# Patient Record
Sex: Male | Born: 1955 | Race: Black or African American | Hispanic: No | Marital: Single | Smoking: Former smoker
Health system: Southern US, Community
[De-identification: ages and names within clinical notes are randomized; demographics above are authoritative.]

## PROBLEM LIST (undated history)

## (undated) DIAGNOSIS — K4090 Unilateral inguinal hernia, without obstruction or gangrene, not specified as recurrent: Secondary | ICD-10-CM

## (undated) DIAGNOSIS — N433 Hydrocele, unspecified: Secondary | ICD-10-CM

## (undated) DIAGNOSIS — I255 Ischemic cardiomyopathy: Secondary | ICD-10-CM

## (undated) DIAGNOSIS — I10 Essential (primary) hypertension: Secondary | ICD-10-CM

## (undated) DIAGNOSIS — F17200 Nicotine dependence, unspecified, uncomplicated: Secondary | ICD-10-CM

## (undated) HISTORY — PX: APPENDECTOMY: SHX54

---

## 2013-04-20 ENCOUNTER — Emergency Department (HOSPITAL_COMMUNITY)
Admission: EM | Admit: 2013-04-20 | Discharge: 2013-04-21 | Disposition: A | Discharge status: Home / Self Care | Payer: Self-pay | Attending: Emergency Medicine | Admitting: Emergency Medicine

## 2013-04-20 ENCOUNTER — Encounter (HOSPITAL_COMMUNITY): Payer: Self-pay | Admitting: Emergency Medicine

## 2013-04-20 DIAGNOSIS — M62830 Muscle spasm of back: Secondary | ICD-10-CM

## 2013-04-20 DIAGNOSIS — I10 Essential (primary) hypertension: Secondary | ICD-10-CM | POA: Insufficient documentation

## 2013-04-20 DIAGNOSIS — F17200 Nicotine dependence, unspecified, uncomplicated: Secondary | ICD-10-CM | POA: Insufficient documentation

## 2013-04-20 DIAGNOSIS — M5380 Other specified dorsopathies, site unspecified: Secondary | ICD-10-CM | POA: Insufficient documentation

## 2013-04-20 HISTORY — DX: Essential (primary) hypertension: I10

## 2013-04-20 NOTE — ED Notes (Signed)
Pt. reprors low back pain onset last night after he lifted his leg while taking a bath last night , denies injury or fall . Ambulatory.

## 2013-04-21 MED ORDER — DIAZEPAM 5 MG PO TABS: 5.0000 mg | ORAL_TABLET | Freq: Two times a day (BID) | ORAL | Status: DC | PRN

## 2013-04-21 MED ORDER — PREDNISONE 20 MG PO TABS: 60.0000 mg | ORAL_TABLET | Freq: Every day | ORAL | Status: DC

## 2013-04-21 MED ORDER — PREDNISONE 20 MG PO TABS
60.0000 mg | ORAL_TABLET | Freq: Once | ORAL | Status: AC
  Administered 2013-04-21: 60 mg via ORAL
  Filled 2013-04-21: qty 3

## 2013-04-21 MED ORDER — DIAZEPAM 5 MG PO TABS
5.0000 mg | ORAL_TABLET | Freq: Once | ORAL | Status: AC
  Administered 2013-04-21: 5 mg via ORAL
  Filled 2013-04-21: qty 1

## 2013-04-21 NOTE — ED Provider Notes (Signed)
CSN: [id number]     Arrival date & time 04/20/13  2348 History   First MD Initiated Contact with Patient 04/21/13 0027     Chief Complaint  Patient presents with  . Back Pain   (Consider location/radiation/quality/duration/timing/severity/associated sxs/prior Treatment) HPI Comments: Patient is a 57 year old male past medical history significant for hypertension presenting to the emergency department for severe left lower back pain without radiation. Patient states he was in the shower last night slipped and pulled his lower back without fall. He states he has had constant sharp pain since then. He has tried one dose of over-the-counter pain medication without relief. He states ambulating atrophy of his pain. He denies any fevers, numbness or weakness, bladder or bowel incontinence, history of cancer, history of IV drug use. He denies any previous back injury.  Patient is a 57 y.o. male presenting with back pain.  Back Pain Associated symptoms: no fever     Past Medical History  Diagnosis Date  . Hypertension    History reviewed. No pertinent past surgical history. No family history on file. History  Substance Use Topics  . Smoking status: Current Every Day Smoker  . Smokeless tobacco: Not on file  . Alcohol Use: Yes    Review of Systems  Constitutional: Negative for fever.  Musculoskeletal: Positive for back pain.  All other systems reviewed and are negative.    Allergies  Review of patient's allergies indicates no known allergies.  Home Medications   Current Outpatient Rx  Name  Route  Sig  Dispense  Refill  . diazepam (VALIUM) 5 MG tablet   Oral   Take 1 tablet (5 mg total) by mouth every 12 (twelve) hours as needed (spasm).   15 tablet   0   . predniSONE (DELTASONE) 20 MG tablet   Oral   Take 3 tablets (60 mg total) by mouth daily.   12 tablet   0    BP 165/93  Pulse 73  Temp(Src) 99.3 F (37.4 C) (Oral)  Resp 14  SpO2 99% Physical Exam    Constitutional: He is oriented to person, place, and time. He appears well-developed and well-nourished. No distress.  HENT:  Head: Normocephalic and atraumatic.  Right Ear: External ear normal.  Left Ear: External ear normal.  Nose: Nose normal.  Mouth/Throat: Oropharynx is clear and moist. No oropharyngeal exudate.  Eyes: Conjunctivae and EOM are normal. Pupils are equal, round, and reactive to light.  Neck: Normal range of motion. Neck supple.  Cardiovascular: Normal rate, regular rhythm, normal heart sounds and intact distal pulses.   Pulmonary/Chest: Effort normal and breath sounds normal.  Abdominal: Soft. There is no tenderness.  Musculoskeletal: He exhibits no edema.       Lumbar back: He exhibits tenderness and spasm. He exhibits no bony tenderness, no swelling, no edema, no deformity and no laceration.       Back:  Neurological: He is alert and oriented to person, place, and time. He has normal strength. No cranial nerve deficit or sensory deficit. Gait normal. GCS eye subscore is 4. GCS verbal subscore is 5. GCS motor subscore is 6.  No pronator drift. Bilateral heel-knee-shin intact.  Skin: Skin is warm and dry. He is not diaphoretic.    ED Course  Procedures (including critical care time) Medications  diazepam (VALIUM) tablet 5 mg (5 mg Oral Given 04/21/13 0116)  predniSONE (DELTASONE) tablet 60 mg (60 mg Oral Given 04/21/13 0116)    Labs Review Labs Reviewed - No  data to display Imaging Review No results found.  EKG Interpretation   None       MDM   1. Muscle spasm of back     Afebrile, NAD, non-toxic appearing, AAOx4. Patient with back pain.  No neurological deficits and normal neuro exam.  Patient can walk but states is painful.  No loss of bowel or bladder control.  No concern for cauda equina.  No fever, night sweats, weight loss, h/o cancer, IVDU.  RICE protocol and pain medicine indicated and discussed with patient. Return precautions discussed. Patient  agreeable to plan. Patient stable at time of discharge.      Jeannetta Ellis, PA-C 04/21/13 [phone]

## 2013-04-21 NOTE — ED Notes (Signed)
Pt reports he was wiping his left foot last night and had sudden onset of pain left lower back.  Denies N/T distally.  Unable to move by himself due to pain.

## 2013-04-21 NOTE — ED Provider Notes (Signed)
Medical screening examination/treatment/procedure(s) were performed by non-physician practitioner and as supervising physician I was immediately available for consultation/collaboration.  EKG Interpretation   None         David H Yao, MD 04/21/13 0718 

## 2013-04-25 ENCOUNTER — Inpatient Hospital Stay (HOSPITAL_COMMUNITY)
Admission: EM | Admit: 2013-04-25 | Discharge: 2013-04-27 | DRG: 552 | Disposition: A | Discharge status: Home / Self Care | Payer: Self-pay | Attending: Internal Medicine | Admitting: Internal Medicine

## 2013-04-25 ENCOUNTER — Observation Stay (HOSPITAL_COMMUNITY): Payer: Self-pay

## 2013-04-25 ENCOUNTER — Encounter (HOSPITAL_COMMUNITY): Payer: Self-pay | Admitting: Emergency Medicine

## 2013-04-25 ENCOUNTER — Emergency Department (HOSPITAL_COMMUNITY): Payer: Self-pay

## 2013-04-25 DIAGNOSIS — M5417 Radiculopathy, lumbosacral region: Secondary | ICD-10-CM

## 2013-04-25 DIAGNOSIS — M5126 Other intervertebral disc displacement, lumbar region: Secondary | ICD-10-CM

## 2013-04-25 DIAGNOSIS — M5116 Intervertebral disc disorders with radiculopathy, lumbar region: Secondary | ICD-10-CM

## 2013-04-25 DIAGNOSIS — F419 Anxiety disorder, unspecified: Secondary | ICD-10-CM

## 2013-04-25 DIAGNOSIS — F17200 Nicotine dependence, unspecified, uncomplicated: Secondary | ICD-10-CM | POA: Diagnosis present

## 2013-04-25 DIAGNOSIS — M5137 Other intervertebral disc degeneration, lumbosacral region: Principal | ICD-10-CM | POA: Diagnosis present

## 2013-04-25 DIAGNOSIS — D72829 Elevated white blood cell count, unspecified: Secondary | ICD-10-CM

## 2013-04-25 DIAGNOSIS — M51379 Other intervertebral disc degeneration, lumbosacral region without mention of lumbar back pain or lower extremity pain: Principal | ICD-10-CM | POA: Diagnosis present

## 2013-04-25 DIAGNOSIS — I10 Essential (primary) hypertension: Secondary | ICD-10-CM

## 2013-04-25 DIAGNOSIS — F411 Generalized anxiety disorder: Secondary | ICD-10-CM | POA: Diagnosis present

## 2013-04-25 DIAGNOSIS — M549 Dorsalgia, unspecified: Secondary | ICD-10-CM

## 2013-04-25 LAB — CBC WITH DIFFERENTIAL/PLATELET
Basophils Absolute: 0 10*3/uL (ref 0.0–0.1)
Eosinophils Relative: 0 % (ref 0–5)
HCT: 43 % (ref 39.0–52.0)
Hemoglobin: 15.5 g/dL (ref 13.0–17.0)
Lymphocytes Relative: 15 % (ref 12–46)
Lymphs Abs: 1.9 10*3/uL (ref 0.7–4.0)
MCV: 94.3 fL (ref 78.0–100.0)
Monocytes Absolute: 0.2 10*3/uL (ref 0.1–1.0)
Monocytes Relative: 2 % — ABNORMAL LOW (ref 3–12)
RBC: 4.56 MIL/uL (ref 4.22–5.81)
RDW: 12.8 % (ref 11.5–15.5)
WBC: 12.8 10*3/uL — ABNORMAL HIGH (ref 4.0–10.5)

## 2013-04-25 LAB — COMPREHENSIVE METABOLIC PANEL
ALT: 14 U/L (ref 0–53)
Albumin: 3.2 g/dL — ABNORMAL LOW (ref 3.5–5.2)
Alkaline Phosphatase: 74 U/L (ref 39–117)
BUN: 16 mg/dL (ref 6–23)
Calcium: 8.7 mg/dL (ref 8.4–10.5)
Creatinine, Ser: 1.02 mg/dL (ref 0.50–1.35)
GFR calc non Af Amer: 81 mL/min — ABNORMAL LOW (ref >90)
Glucose, Bld: 185 mg/dL — ABNORMAL HIGH (ref 70–99)
Potassium: 4.4 mEq/L (ref 3.5–5.1)
Sodium: 138 mEq/L (ref 135–145)
Total Protein: 7 g/dL (ref 6.0–8.3)

## 2013-04-25 LAB — URINALYSIS, ROUTINE W REFLEX MICROSCOPIC
Glucose, UA: NEGATIVE mg/dL
Hgb urine dipstick: NEGATIVE
Specific Gravity, Urine: 1.018 (ref 1.005–1.030)
pH: 6 (ref 5.0–8.0)

## 2013-04-25 LAB — CBC
HCT: 43.3 % (ref 39.0–52.0)
Hemoglobin: 15.4 g/dL (ref 13.0–17.0)
MCH: 33.5 pg (ref 26.0–34.0)
MCV: 94.1 fL (ref 78.0–100.0)
Platelets: 262 10*3/uL (ref 150–400)
RBC: 4.6 MIL/uL (ref 4.22–5.81)
WBC: 15.1 10*3/uL — ABNORMAL HIGH (ref 4.0–10.5)

## 2013-04-25 LAB — BASIC METABOLIC PANEL
BUN: 15 mg/dL (ref 6–23)
CO2: 23 mEq/L (ref 19–32)
Calcium: 8.6 mg/dL (ref 8.4–10.5)
Chloride: 101 mEq/L (ref 96–112)
Creatinine, Ser: 0.98 mg/dL (ref 0.50–1.35)
Glucose, Bld: 213 mg/dL — ABNORMAL HIGH (ref 70–99)

## 2013-04-25 MED ORDER — SORBITOL 70 % SOLN
30.0000 mL | Freq: Every day | Status: DC | PRN
  Filled 2013-04-25: qty 30

## 2013-04-25 MED ORDER — SODIUM CHLORIDE 0.9 % IV SOLN
INTRAVENOUS | Status: DC
  Administered 2013-04-25 – 2013-04-27 (×3): via INTRAVENOUS

## 2013-04-25 MED ORDER — PREDNISONE 50 MG PO TABS
60.0000 mg | ORAL_TABLET | Freq: Every day | ORAL | Status: DC
  Administered 2013-04-25 – 2013-04-27 (×3): 60 mg via ORAL
  Filled 2013-04-25 (×4): qty 1

## 2013-04-25 MED ORDER — ALUM & MAG HYDROXIDE-SIMETH 200-200-20 MG/5ML PO SUSP
15.0000 mL | Freq: Once | ORAL | Status: AC
  Administered 2013-04-25: 15 mL via ORAL
  Filled 2013-04-25: qty 30

## 2013-04-25 MED ORDER — DIAZEPAM 5 MG PO TABS
5.0000 mg | ORAL_TABLET | Freq: Two times a day (BID) | ORAL | Status: DC | PRN
  Administered 2013-04-25 – 2013-04-26 (×2): 5 mg via ORAL
  Filled 2013-04-25 (×2): qty 1

## 2013-04-25 MED ORDER — HYDROMORPHONE HCL PF 1 MG/ML IJ SOLN
1.0000 mg | Freq: Once | INTRAMUSCULAR | Status: AC
  Administered 2013-04-25: 1 mg via INTRAVENOUS
  Filled 2013-04-25: qty 1

## 2013-04-25 MED ORDER — HYDROMORPHONE HCL PF 1 MG/ML IJ SOLN: 1.0000 mg | INTRAMUSCULAR | Status: DC | PRN

## 2013-04-25 MED ORDER — DOCUSATE SODIUM 100 MG PO CAPS
100.0000 mg | ORAL_CAPSULE | Freq: Two times a day (BID) | ORAL | Status: DC
  Administered 2013-04-25 – 2013-04-27 (×5): 100 mg via ORAL
  Filled 2013-04-25 (×4): qty 1

## 2013-04-25 MED ORDER — OXYCODONE HCL 5 MG PO TABS
5.0000 mg | ORAL_TABLET | ORAL | Status: DC | PRN
  Administered 2013-04-25 – 2013-04-27 (×6): 5 mg via ORAL
  Filled 2013-04-25 (×6): qty 1

## 2013-04-25 MED ORDER — LIDOCAINE 5 % EX PTCH
1.0000 | MEDICATED_PATCH | CUTANEOUS | Status: DC
  Administered 2013-04-25 – 2013-04-26 (×2): 1 via TRANSDERMAL
  Filled 2013-04-25 (×3): qty 1

## 2013-04-25 MED ORDER — METHYLPREDNISOLONE SODIUM SUCC 125 MG IJ SOLR
125.0000 mg | Freq: Once | INTRAMUSCULAR | Status: AC
  Administered 2013-04-25: 125 mg via INTRAVENOUS
  Filled 2013-04-25: qty 2

## 2013-04-25 MED ORDER — AMLODIPINE BESYLATE 5 MG PO TABS
5.0000 mg | ORAL_TABLET | Freq: Every day | ORAL | Status: DC
  Administered 2013-04-25 – 2013-04-27 (×3): 5 mg via ORAL
  Filled 2013-04-25 (×3): qty 1

## 2013-04-25 MED ORDER — HYDROMORPHONE HCL PF 1 MG/ML IJ SOLN
2.0000 mg | INTRAMUSCULAR | Status: DC | PRN
  Administered 2013-04-26 (×2): 2 mg via INTRAVENOUS
  Filled 2013-04-25 (×2): qty 2

## 2013-04-25 MED ORDER — ACETAMINOPHEN 325 MG PO TABS: 650.0000 mg | ORAL_TABLET | Freq: Four times a day (QID) | ORAL | Status: DC | PRN

## 2013-04-25 MED ORDER — ONDANSETRON HCL 4 MG/2ML IJ SOLN
4.0000 mg | Freq: Once | INTRAMUSCULAR | Status: AC
  Administered 2013-04-25: 4 mg via INTRAVENOUS
  Filled 2013-04-25: qty 2

## 2013-04-25 MED ORDER — ENOXAPARIN SODIUM 40 MG/0.4ML ~~LOC~~ SOLN
40.0000 mg | SUBCUTANEOUS | Status: DC
  Administered 2013-04-25 – 2013-04-26 (×2): 40 mg via SUBCUTANEOUS
  Filled 2013-04-25 (×3): qty 0.4

## 2013-04-25 MED ORDER — SODIUM CHLORIDE 0.9 % IV SOLN
Freq: Once | INTRAVENOUS | Status: AC
  Administered 2013-04-25: 04:00:00 via INTRAVENOUS

## 2013-04-25 MED ORDER — ACETAMINOPHEN 650 MG RE SUPP: 650.0000 mg | Freq: Four times a day (QID) | RECTAL | Status: DC | PRN

## 2013-04-25 NOTE — ED Notes (Signed)
Pt has returned from radiology.  

## 2013-04-25 NOTE — Progress Notes (Signed)
See formal consult note

## 2013-04-25 NOTE — ED Provider Notes (Signed)
CSN: [id number]     Arrival date & time 04/25/13  0255 History   First MD Initiated Contact with Patient 04/25/13 0309     Chief Complaint  Patient presents with  . Back Pain   (Consider location/radiation/quality/duration/timing/severity/associated sxs/prior Treatment) HPI Comments:  Persistent low back pain radiating to L buttock for the past 5 days despite treatment with steroids, muscle relaxers and narcotic pain control Denies loss of bowel or bladder control weakness in legs   Patient is a 57 y.o. male presenting with back pain. The history is provided by the patient and the spouse.  Back Pain Location:  Lumbar spine Quality:  Aching and shooting Radiates to:  L posterior upper leg Pain severity:  Severe Pain is:  Same all the time Onset quality:  Sudden Duration:  5 days Timing:  Constant Progression:  Worsening Chronicity:  New Context: twisting   Context: not falling and not recent injury   Context comment:  Patiet stes he was bending in the shower when he felt a sharp pain in his back that has been persistent since  Relieved by:  Nothing Worsened by:  Movement Ineffective treatments:  Muscle relaxants, narcotics, heating pad, cold packs, lying down and ibuprofen Associated symptoms: no bladder incontinence, no bowel incontinence, no chest pain, no dysuria, no fever, no numbness, no paresthesias and no weakness   Risk factors: steroid use   Risk factors: not obese     Past Medical History  Diagnosis Date  . Hypertension    History reviewed. No pertinent past surgical history. No family history on file. History  Substance Use Topics  . Smoking status: Current Every Day Smoker  . Smokeless tobacco: Not on file  . Alcohol Use: Yes    Review of Systems  Constitutional: Negative for fever, chills, activity change and appetite change.  Respiratory: Negative for shortness of breath.   Cardiovascular: Negative for chest pain and leg swelling.  Gastrointestinal:  Negative for bowel incontinence.  Genitourinary: Negative for bladder incontinence, dysuria and frequency.  Musculoskeletal: Positive for back pain. Negative for neck pain and neck stiffness.  Skin: Negative for rash and wound.  Neurological: Negative for dizziness, weakness, numbness and paresthesias.    Allergies  Review of patient's allergies indicates no known allergies.  Home Medications   Current Outpatient Rx  Name  Route  Sig  Dispense  Refill  . diazepam (VALIUM) 5 MG tablet   Oral   Take 1 tablet (5 mg total) by mouth every 12 (twelve) hours as needed (spasm).   15 tablet   0   . predniSONE (DELTASONE) 20 MG tablet   Oral   Take 3 tablets (60 mg total) by mouth daily.   12 tablet   0    BP 156/88  Pulse 64  Temp(Src) 98.3 F (36.8 C) (Oral)  Resp 18  SpO2 99% Physical Exam  Nursing note and vitals reviewed. Constitutional: He is oriented to person, place, and time. He appears well-nourished.  HENT:  Head: Normocephalic.  Eyes: Pupils are equal, round, and reactive to light.  Neck: Normal range of motion.  Cardiovascular: Normal rate and regular rhythm.   Pulmonary/Chest: Effort normal and breath sounds normal.  Musculoskeletal: He exhibits tenderness. He exhibits no edema.       Lumbar back: He exhibits decreased range of motion, tenderness and pain. He exhibits no bony tenderness, no swelling, no edema, no deformity and no spasm.       Back:  Neurological: He is alert  and oriented to person, place, and time.  Pain with straight leg raise, flexion   Skin: Skin is warm and dry. No rash noted. No erythema.    ED Course  Procedures (including critical care time) Labs Review Labs Reviewed - No data to display Imaging Review Dg Lumbar Spine Complete  04/25/2013   CLINICAL DATA:  Back pain for the past week.  EXAM: LUMBAR SPINE - COMPLETE 4+ VIEW  COMPARISON:  No priors.  FINDINGS: Five views of the lumbar spine demonstrate no definite acute displaced  fracture or compression type fracture. Mild multilevel degenerative disc disease, most severe at L2-L3. Multilevel facet arthropathy. No defects of the pars interarticularis. Mild dextroscoliosis of the lumbar spine convex to the right at L2-L3. Alignment is otherwise anatomic.  IMPRESSION: 1. No acute radiographic abnormality of the lumbar spine. 2. Mild multilevel degenerative disc disease, lumbar spondylosis and mild dextroscoliosis, as above.   Electronically Signed   By: Trudie Reed M.D.   On: 04/25/2013 04:14   Ct Lumbar Spine Wo Contrast  04/25/2013   CLINICAL DATA:  Chronic low back pain, failing conservative therapy.  EXAM: CT LUMBAR SPINE WITHOUT CONTRAST  TECHNIQUE: Multidetector CT imaging of the lumbar spine was performed without intravenous contrast administration. Multiplanar CT image reconstructions were also generated.  COMPARISON:  Lumbar spine radiographs April 25, 2013.  FINDINGS: Lumbar vertebral bodies and posterior elements are intact and aligned with maintenance of the lumbar lordosis. Using the reference level of the last well-formed intervertebral disc as L5-S1, mild L2-3 disc height loss, with proportional central endplate spurring, minimal endplate spurring at the remaining lumbar levels. 10 mm sclerotic focus within the posterior aspect of L2 vertebral body may reflect a hemangioma, without aggressive features. Minimal platelike calcification within the ventral epidural space at L2. Included prevertebral and paraspinal soft tissues are nonsuspicious. Mild calcific atherosclerosis the great vessels.  Level by level evaluation:  L1-L2: Small left paracentral broad-based disc osteophyte complex, no canal stenosis or neural foraminal narrowing.  L2-L3: Large broad-based disk bulge. Mild facet arthropathy and ligamentum flavum redundancy. Moderate to severe canal stenosis. Mild neural foraminal narrowing.  L3-L4: Small broad-based disc bulge asymmetric to left. Mild to moderate  facet arthropathy and ligamentum flavum redundancy. Moderate canal stenosis. Mild bilateral neural foraminal narrowing.  L4-L5: Moderate broad-based disc bulge with moderate to severe right, moderate left facet arthropathy and ligamentum flavum redundancy result in at least moderate canal stenosis. Moderate to severe bilateral neural foraminal narrowing.  L5-S1: Small broad-based disc bulge. Moderate to severe facet arthropathy, left greater than right without canal stenosis. Moderate to severe right, severe left neural foraminal narrowing.  IMPRESSION: No acute fracture or malalignment.  Degenerative change of the lumbar spine, with resultant apparent moderate to severe canal stenosis at L2-3, moderate at L3-4 and L4-5.  Neural foraminal narrowing L2-3 to L5-S1: Severe on the left at L5-S1. Constellation of findings may be better assessed on MRI of the lumbar spine as clinically indicated.   Electronically Signed   By: Awilda Metro   On: 04/25/2013 05:05    EKG Interpretation   None       MDM   1. Lumbar disc disease with radiculopathy    Spoke with Dr. Holley Raring, who reviewed CT scan and aggress with MRI, admission to hospital for pain control  Will admit for pain control     Arman Filter, NP 04/25/13 0547  Arman Filter, NP 04/25/13 1610  Arman Filter, NP 04/25/13 [phone]

## 2013-04-25 NOTE — Consult Note (Signed)
Neurosurgical Consult  Logan Brewer NFA:[id number] DOB: [birth year] DOA: 04/25/2013  Referring physician:  PCP: No PCP Per Patient  Specialists:  Chief Complaint: Back pain  HPI: Logan Brewer is a 57 y.o. male with no significant past medical history who presented to the emergency department overnight with complaints of lower back pain. He states that symptoms started last Thursday when he leaned over in the shower. He described experiencing severe sudden onset left lower back pain. He was seen in the emergency room on 04/21/2013 as his back pain was felt to be non-emergent, without loss of bowel or bladder control. Patient did not report fevers chills night sweats. He was managed conservatively and discharged from emergency department. He came in again overnight with ongoing back pain. He states symptoms are localized to his left lower back, nonradiating. He denies focal weakness or alteration to sensation and lower extremities. Symptoms are aggravated by sitting up or movement of his back. He denies urinary and bowel incontinence.At the present time, he is complaining of focal left low back pain without radiation into the legs.  He denies weakness or bowel or bladder dysfunction..  Review of Systems: The patient denies anorexia, fever, weight loss,, vision loss, decreased hearing, hoarseness, chest pain, syncope, dyspnea on exertion, peripheral edema, balance deficits, hemoptysis, abdominal pain, melena, hematochezia, severe indigestion/heartburn, hematuria, incontinence, genital sores, muscle weakness, suspicious skin lesions, transient blindness, difficulty walking, depression, unusual weight change, abnormal bleeding, enlarged lymph nodes, angioedema, and breast masses.   Past Medical History   Diagnosis  Date   .  Hypertension     Past Surgical History   Procedure  Laterality  Date   .  Appendectomy      Social History: Patient smokes about a pack of cigarettes a day, denies alcohol or  illicit drug use. He has worked in [employer] for the past 8 years. He is originally from [place]  No Known Allergies  Family History. He reports his mother having a history of hypertension and diabetes, passed away secondary to diabetic complications. He also reports having a sister with a history of hypertension.  Prior to Admission medications   Medication  Sig  Start Date  End Date  Taking?  Authorizing Provider   diazepam (VALIUM) 5 MG tablet  Take 1 tablet (5 mg total) by mouth every 12 (twelve) hours as needed (spasm).  04/21/13   Yes  Jennifer L Piepenbrink, PA-C   predniSONE (DELTASONE) 20 MG tablet  Take 3 tablets (60 mg total) by mouth daily.  04/21/13   Yes  Lise Auer Piepenbrink, PA-C   Physical Exam:  Filed Vitals:    04/25/13 1313   BP:  171/74   Pulse:  56   Temp:  97.9 F (36.6 C)   Resp:  20    General: No acute distress he is awake alert, nontoxic appearing  Eyes: Pupils are equal round reactive to light extraocular movements are intact  Neck: Supple symmetrical no jugular venous distention  Cardiovascular: Regular rate rhythm normal S1-S2, no murmurs rubs or gallop  Respiratory: Normal respiratory effort, lungs are clear to auscultation bilaterally  Abdomen: Soft nontender nondistended positive bowel sounds  Skin: No rashes or lesion  Musculoskeletal: He has positive straight leg rising to left lower extremity and right lower extremity. No extremity edema noted. He complains of pain to palpation over left lower back region. No palpable masses.  Psychiatric: Patient is awake alert and oriented x3  Neurologic: Patient having nonfocal neurologic examination. There are no  alteration to sensation to extremities, overall he has 5 out of 5 muscle strength to bilateral upper and lower extremities. Cranial nerves II through XII are grossly intact.  He denies numbness in his legs.  Reflexes are symmetric.  He has a positive left straight leg raise at 45 degrees for low back pain  and negative on the right.  Labs on Admission:  Basic Metabolic Panel:   Recent Labs  Lab  04/25/13 0714   NA  138   K  4.4   CL  105   CO2  25   GLUCOSE  185*   BUN  16   CREATININE  1.02   CALCIUM  8.7    Liver Function Tests:   Recent Labs  Lab  04/25/13 0714   AST  12   ALT  14   ALKPHOS  74   BILITOT  0.2*   PROT  7.0   ALBUMIN  3.2*    No results found for this basename: LIPASE, AMYLASE, in the last 168 hours  No results found for this basename: AMMONIA, in the last 168 hours  CBC:   Recent Labs  Lab  04/25/13 0714   WBC  12.8*   NEUTROABS  10.7*   HGB  15.5   HCT  43.0   MCV  94.3   PLT  251    Cardiac Enzymes:  No results found for this basename: CKTOTAL, CKMB, CKMBINDEX, TROPONINI, in the last 168 hours  BNP (last 3 results)  No results found for this basename: PROBNP, in the last 8760 hours  CBG:  No results found for this basename: GLUCAP, in the last 168 hours  Radiological Exams on Admission:  Dg Lumbar Spine Complete  04/25/2013 CLINICAL DATA: Back pain for the past week. EXAM: LUMBAR SPINE - COMPLETE 4+ VIEW COMPARISON: No priors. FINDINGS: Five views of the lumbar spine demonstrate no definite acute displaced fracture or compression type fracture. Mild multilevel degenerative disc disease, most severe at L2-L3. Multilevel facet arthropathy. No defects of the pars interarticularis. Mild dextroscoliosis of the lumbar spine convex to the right at L2-L3. Alignment is otherwise anatomic. IMPRESSION: 1. No acute radiographic abnormality of the lumbar spine. 2. Mild multilevel degenerative disc disease, lumbar spondylosis and mild dextroscoliosis, as above. Electronically Signed By: Trudie Reed M.D. On: 04/25/2013 04:14  Ct Lumbar Spine Wo Contrast  04/25/2013 CLINICAL DATA: Chronic low back pain, failing conservative therapy. EXAM: CT LUMBAR SPINE WITHOUT CONTRAST TECHNIQUE: Multidetector CT imaging of the lumbar spine was performed without intravenous  contrast administration. Multiplanar CT image reconstructions were also generated. COMPARISON: Lumbar spine radiographs April 25, 2013. FINDINGS: Lumbar vertebral bodies and posterior elements are intact and aligned with maintenance of the lumbar lordosis. Using the reference level of the last well-formed intervertebral disc as L5-S1, mild L2-3 disc height loss, with proportional central endplate spurring, minimal endplate spurring at the remaining lumbar levels. 10 mm sclerotic focus within the posterior aspect of L2 vertebral body may reflect a hemangioma, without aggressive features. Minimal platelike calcification within the ventral epidural space at L2. Included prevertebral and paraspinal soft tissues are nonsuspicious. Mild calcific atherosclerosis the great vessels. Level by level evaluation: L1-L2: Small left paracentral broad-based disc osteophyte complex, no canal stenosis or neural foraminal narrowing. L2-L3: Large broad-based disk bulge. Mild facet arthropathy and ligamentum flavum redundancy. Moderate to severe canal stenosis. Mild neural foraminal narrowing. L3-L4: Small broad-based disc bulge asymmetric to left. Mild to moderate facet arthropathy and ligamentum flavum redundancy. Moderate  canal stenosis. Mild bilateral neural foraminal narrowing. L4-L5: Moderate broad-based disc bulge with moderate to severe right, moderate left facet arthropathy and ligamentum flavum redundancy result in at least moderate canal stenosis. Moderate to severe bilateral neural foraminal narrowing. L5-S1: Small broad-based disc bulge. Moderate to severe facet arthropathy, left greater than right without canal stenosis. Moderate to severe right, severe left neural foraminal narrowing. IMPRESSION: No acute fracture or malalignment. Degenerative change of the lumbar spine, with resultant apparent moderate to severe canal stenosis at L2-3, moderate at L3-4 and L4-5. Neural foraminal narrowing L2-3 to L5-S1: Severe on the  left at L5-S1. Constellation of findings may be better assessed on MRI of the lumbar spine as clinically indicated. Electronically Signed By: Awilda Metro On: 04/25/2013 05:05  Mr Lumbar Spine Wo Contrast  04/25/2013 CLINICAL DATA: Low back pain with left hip pain EXAM: MRI LUMBAR SPINE WITHOUT CONTRAST TECHNIQUE: Multiplanar, multisequence MR imaging was performed. No intravenous contrast was administered. COMPARISON: CT lumbar 04/25/2013 FINDINGS: Normal lumbar alignment. Negative for fracture. Conus medullaris has normal signal and terminates at L2-3. 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 Sclerotic lesion is present in the L2 vertebral body on the right posteriorly. This corresponds to the abnormality on CT. There is a mild amount of edema surrounding the sclerotic focus which measures 10 mm. There is some dural calcification posterior to the lesion. No soft tissue mass in the dural space is present. No other bone marrow lesions are identified. L1-2: Mild disk degeneration. Small left-sided disc protrusion. Mild facet degeneration. L2-3: Disc degeneration with disk space narrowing and diffuse disc bulging. Moderately large left-sided disc protrusion with impingement of the left L3 nerve root. Possible extruded disk fragment is present on the left. There is facet hypertrophy and moderate to severe spinal stenosis. L3-4: Disc degeneration with diffuse disc bulging and spondylosis. Bilateral facet hypertrophy causing moderate spinal stenosis. L4-5: Disc degeneration with disk bulging and spondylosis. Bilateral facet hypertrophy with moderate to severe spinal stenosis. Moderate foraminal encroachment bilaterally. L5-S1: Disk bulging and spurring diffusely left greater than right. Bilateral facet hypertrophy left greater than right. Moderate left foraminal encroachment with impingement of the left L5 nerve root. Mild to moderate right foraminal  encroachment also noted. IMPRESSION: Sclerotic lesion in the L2 vertebral body on the right posteriorly corresponding to the CT finding. This appears to be a nonaggressive process there is associated dural calcification at this level. This may be a healed process, perhaps a hemangioma. Followup MRI with contrast in 3-6 months is suggested to assure stability. Moderately large left paracentral disc protrusion L2-3 with impingement of the left L3 nerve root and moderate to severe spinal stenosis. Multifactorial spinal stenosis at L3-4 and L4-5. Left foraminal encroachment L5-S1. Electronically Signed By: Marlan Palau M.D. On: 04/25/2013 08:33   Assessment/Plan  Active Problems:  Radiculopathy, lumbosacral region  Back pain  Anxiety   1. Lower back pain. Patient having 2 ER visits with complaints of lower back pain. An MRI of lumbar spine today showing moderately large left paracentral disc protrusion of L2 and L3 with impingement of the L3 nerve root and moderate to severe spinal stenosis. I do not believe this patient needs surgical intervention at this time.  He may do well with a left L 3 selective nerve root block and would have Interventional Radiology proceed with that tomorrow.  He should also commence PT while in the hospital.  He can follow up with me as  an outpatient. 2. Hypertension. Throughout the day he has had systolic blood pressures mostly in the 140s however recent blood pressure was 171/74. This could be related to severe pain. He relates having a history of hypertension, will start Norvasc 5 mg by mouth daily.  3. Leukocytosis. White count of 12,800 on initial lab work. He is currently afebrile, nontoxic. Could be secondary to underlying inflammatory process. Hold off on antimicrobial therapy for now. 4. DVT prophylaxis. Lovenox  Code Status: Full Code  Family Communication: Plan discussed with the patient  Disposition Plan: Patient requiring IV narcotic analgesics, I suspect he may  require greater than 2 night hospitalization   Josie Saunders MD

## 2013-04-25 NOTE — ED Notes (Signed)
Pt. reports chronic left lower back pain for several days , denies injury or fall , no urinary discomfort / no hematuria , seen here last week prescribed with prednisone and valium with no improvement.

## 2013-04-25 NOTE — ED Notes (Signed)
Family at bedside. 

## 2013-04-25 NOTE — ED Provider Notes (Signed)
Medical screening examination/treatment/procedure(s) were performed by non-physician practitioner and as supervising physician I was immediately available for consultation/collaboration.   Stefon Ramthun, MD 04/25/13 0624 

## 2013-04-25 NOTE — H&P (Signed)
[facility] History and Physical  Stephane Niemann HYQ:[id number] DOB: May 06, 1956 DOA: 04/25/2013  Referring physician:  PCP: No PCP Per Patient  Specialists:   Chief Complaint: Back pain  HPI: Logan Brewer is a 57 y.o. male with no significant past medical history who presented to the emergency department overnight with complaints of lower back pain. He states that symptoms started last Thursday when he leaned over in the shower. He described experiencing severe sudden onset left lower back pain. He was seen in the emergency room on 04/21/2013  as his back pain was felt to be non-emergent, without loss of bowel or bladder control. Patient did not report fevers chills night sweats. He was managed conservatively and discharged from emergency department. He came in again overnight with ongoing back pain. He states symptoms are localized to his left lower back, nonradiating. He denies focal weakness or alteration to sensation and lower extremities. Symptoms are aggravated by sitting up or movement of his back. He denies urinary and bowel incontinence. I discussed case with Dr. Venetia Maxon of neurosurgery who will evaluate patient this afternoon.                                                                           Review of Systems: The patient denies anorexia, fever, weight loss,, vision loss, decreased hearing, hoarseness, chest pain, syncope, dyspnea on exertion, peripheral edema, balance deficits, hemoptysis, abdominal pain, melena, hematochezia, severe indigestion/heartburn, hematuria, incontinence, genital sores, muscle weakness, suspicious skin lesions, transient blindness, difficulty walking, depression, unusual weight change, abnormal bleeding, enlarged lymph nodes, angioedema, and breast masses.   Past Medical History  Diagnosis Date  . Hypertension    Past Surgical History  Procedure Laterality Date  . Appendectomy     Social History: Patient smokes about a pack of cigarettes a day,  denies alcohol or illicit drug use. He has worked in [employer] for the past 8 years. He is originally from [place] No Known Allergies  Family History.  He reports his mother having a history of hypertension and diabetes, passed away secondary to diabetic complications. He also reports having a sister with a history of hypertension.  Prior to Admission medications   Medication Sig Start Date End Date Taking? Authorizing Provider  diazepam (VALIUM) 5 MG tablet Take 1 tablet (5 mg total) by mouth every 12 (twelve) hours as needed (spasm). 04/21/13  Yes Jennifer L Piepenbrink, PA-C  predniSONE (DELTASONE) 20 MG tablet Take 3 tablets (60 mg total) by mouth daily. 04/21/13  Yes Lise Auer Piepenbrink, PA-C   Physical Exam: Filed Vitals:   04/25/13 1313  BP: 171/74  Pulse: 56  Temp: 97.9 F (36.6 C)  Resp: 20     General:  No acute distress he is awake alert, nontoxic appearing  Eyes: Pupils are equal round reactive to light extraocular movements are intact  Neck: Supple symmetrical no jugular venous distention  Cardiovascular: Regular rate rhythm normal S1-S2, no murmurs rubs or gallop  Respiratory: Normal respiratory effort, lungs are clear to auscultation bilaterally  Abdomen: Soft nontender nondistended positive bowel sounds  Skin: No rashes or lesion  Musculoskeletal: He has positive straight leg rising to left lower extremity and right lower extremity. No extremity edema noted. He  complains of pain to palpation over left lower back region. No palpable masses.  Psychiatric: Patient is awake alert and oriented x3  Neurologic: Patient having nonfocal neurologic examination. There are no alteration to sensation to extremities, overall he has 5 out of 5 muscle strength to bilateral upper and lower extremities. Cranial nerves II through XII are grossly intact  Labs on Admission:  Basic Metabolic Panel:  Recent Labs Lab 04/25/13 0714  NA 138  K 4.4  CL 105  CO2 25  GLUCOSE  185*  BUN 16  CREATININE 1.02  CALCIUM 8.7   Liver Function Tests:  Recent Labs Lab 04/25/13 0714  AST 12  ALT 14  ALKPHOS 74  BILITOT 0.2*  PROT 7.0  ALBUMIN 3.2*   No results found for this basename: LIPASE, AMYLASE,  in the last 168 hours No results found for this basename: AMMONIA,  in the last 168 hours CBC:  Recent Labs Lab 04/25/13 0714  WBC 12.8*  NEUTROABS 10.7*  HGB 15.5  HCT 43.0  MCV 94.3  PLT 251   Cardiac Enzymes: No results found for this basename: CKTOTAL, CKMB, CKMBINDEX, TROPONINI,  in the last 168 hours  BNP (last 3 results) No results found for this basename: PROBNP,  in the last 8760 hours CBG: No results found for this basename: GLUCAP,  in the last 168 hours  Radiological Exams on Admission: Dg Lumbar Spine Complete  04/25/2013   CLINICAL DATA:  Back pain for the past week.  EXAM: LUMBAR SPINE - COMPLETE 4+ VIEW  COMPARISON:  No priors.  FINDINGS: Five views of the lumbar spine demonstrate no definite acute displaced fracture or compression type fracture. Mild multilevel degenerative disc disease, most severe at L2-L3. Multilevel facet arthropathy. No defects of the pars interarticularis. Mild dextroscoliosis of the lumbar spine convex to the right at L2-L3. Alignment is otherwise anatomic.  IMPRESSION: 1. No acute radiographic abnormality of the lumbar spine. 2. Mild multilevel degenerative disc disease, lumbar spondylosis and mild dextroscoliosis, as above.   Electronically Signed   By: Trudie Reed M.D.   On: 04/25/2013 04:14   Ct Lumbar Spine Wo Contrast  04/25/2013   CLINICAL DATA:  Chronic low back pain, failing conservative therapy.  EXAM: CT LUMBAR SPINE WITHOUT CONTRAST  TECHNIQUE: Multidetector CT imaging of the lumbar spine was performed without intravenous contrast administration. Multiplanar CT image reconstructions were also generated.  COMPARISON:  Lumbar spine radiographs April 25, 2013.  FINDINGS: Lumbar vertebral bodies and  posterior elements are intact and aligned with maintenance of the lumbar lordosis. Using the reference level of the last well-formed intervertebral disc as L5-S1, mild L2-3 disc height loss, with proportional central endplate spurring, minimal endplate spurring at the remaining lumbar levels. 10 mm sclerotic focus within the posterior aspect of L2 vertebral body may reflect a hemangioma, without aggressive features. Minimal platelike calcification within the ventral epidural space at L2. Included prevertebral and paraspinal soft tissues are nonsuspicious. Mild calcific atherosclerosis the great vessels.  Level by level evaluation:  L1-L2: Small left paracentral broad-based disc osteophyte complex, no canal stenosis or neural foraminal narrowing.  L2-L3: Large broad-based disk bulge. Mild facet arthropathy and ligamentum flavum redundancy. Moderate to severe canal stenosis. Mild neural foraminal narrowing.  L3-L4: Small broad-based disc bulge asymmetric to left. Mild to moderate facet arthropathy and ligamentum flavum redundancy. Moderate canal stenosis. Mild bilateral neural foraminal narrowing.  L4-L5: Moderate broad-based disc bulge with moderate to severe right, moderate left facet arthropathy and ligamentum flavum redundancy  result in at least moderate canal stenosis. Moderate to severe bilateral neural foraminal narrowing.  L5-S1: Small broad-based disc bulge. Moderate to severe facet arthropathy, left greater than right without canal stenosis. Moderate to severe right, severe left neural foraminal narrowing.  IMPRESSION: No acute fracture or malalignment.  Degenerative change of the lumbar spine, with resultant apparent moderate to severe canal stenosis at L2-3, moderate at L3-4 and L4-5.  Neural foraminal narrowing L2-3 to L5-S1: Severe on the left at L5-S1. Constellation of findings may be better assessed on MRI of the lumbar spine as clinically indicated.   Electronically Signed   By: Awilda Metro    On: 04/25/2013 05:05   Mr Lumbar Spine Wo Contrast  04/25/2013   CLINICAL DATA:  Low back pain with left hip pain  EXAM: MRI LUMBAR SPINE WITHOUT CONTRAST  TECHNIQUE: Multiplanar, multisequence MR imaging was performed. No intravenous contrast was administered.  COMPARISON:  CT lumbar 04/25/2013  FINDINGS: Normal lumbar alignment. Negative for fracture. Conus medullaris has normal signal and terminates at L2-3. 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  Sclerotic lesion is present in the L2 vertebral body on the right posteriorly. This corresponds to the abnormality on CT. There is a mild amount of edema surrounding the sclerotic focus which measures 10 mm. There is some dural calcification posterior to the lesion. No soft tissue mass in the dural space is present. No other bone marrow lesions are identified.  L1-2: Mild disk degeneration. Small left-sided disc protrusion. Mild facet degeneration.  L2-3: Disc degeneration with disk space narrowing and diffuse disc bulging. Moderately large left-sided disc protrusion with impingement of the left L3 nerve root. Possible extruded disk fragment is present on the left. There is facet hypertrophy and moderate to severe spinal stenosis.  L3-4: Disc degeneration with diffuse disc bulging and spondylosis. Bilateral facet hypertrophy causing moderate spinal stenosis.  L4-5: Disc degeneration with disk bulging and spondylosis. Bilateral facet hypertrophy with moderate to severe spinal stenosis. Moderate foraminal encroachment bilaterally.  L5-S1: Disk bulging and spurring diffusely left greater than right. Bilateral facet hypertrophy left greater than right. Moderate left foraminal encroachment with impingement of the left L5 nerve root. Mild to moderate right foraminal encroachment also noted.  IMPRESSION: Sclerotic lesion in the L2 vertebral body on the right posteriorly corresponding to the CT finding. This  appears to be a nonaggressive process there is associated dural calcification at this level. This may be a healed process, perhaps a hemangioma. Followup MRI with contrast in 3-6 months is suggested to assure stability.  Moderately large left paracentral disc protrusion L2-3 with impingement of the left L3 nerve root and moderate to severe spinal stenosis.  Multifactorial spinal stenosis at L3-4 and L4-5. Left foraminal encroachment L5-S1.   Electronically Signed   By: Marlan Palau M.D.   On: 04/25/2013 08:33     Assessment/Plan Active Problems:   Radiculopathy, lumbosacral region   Back pain   Anxiety   1. Lower back pain. Patient having 2 ER visits with complaints of lower back pain. An MRI of lumbar spine today showing moderately large left paracentral disc protrusion of L2 and L3 with impingement of the L3 nerve root and moderate to severe spinal stenosis. These findings were discussed with Dr. Venetia Maxon of neurosurgery. Dr. Venetia Maxon stated he would come by and see patient this afternoon, appreciate recommendations. Meanwhile will continue prednisone 60 mg by mouth daily, oral and IV narcotic analgesics for  severe breakthrough pain. 2. Hypertension. Throughout the day he has had systolic blood pressures mostly in the 140s however recent blood pressure was 171/74. This could be related to severe pain. He relates having a history of hypertension, will start Norvasc 5 mg by mouth daily.  3. Leukocytosis. White count of 12,800 on initial lab work. He is currently afebrile, nontoxic. Could be secondary to underlying inflammatory process. Hold off on antimicrobial therapy for now. 4. DVT prophylaxis. Lovenox   Code Status: Full Code Family Communication: Plan discussed with the patient Disposition Plan: Patient requiring IV narcotic analgesics, I suspect he may require greater than 2 night hospitalization  Time spent: 67  Jeralyn Bennett [facility] Pager [phone]  If 7PM-7AM, please  contact night-coverage [email or web address] Password [email or web address] 04/25/2013, 2:17 PM

## 2013-04-25 NOTE — Progress Notes (Signed)
Subjective: Patient reports "just my low back and buttock...my thigh hurt two weeks ago"  Objective: Vital signs in last 24 hours: Temp:  [97.9 F (36.6 C)-98.3 F (36.8 C)] 97.9 F (36.6 C) (11/05 1313) Pulse Rate:  [53-86] 56 (11/05 1313) Resp:  [18-20] 20 (11/05 1313) BP: (138-171)/(42-88) 171/74 mmHg (11/05 1313) SpO2:  [91 %-100 %] 100 % (11/05 1313) Weight:  [85.9 kg (189 lb 6 oz)] 85.9 kg (189 lb 6 oz) (11/05 1313)  Intake/Output from previous day:   Intake/Output this shift: Total I/O In: -  Out: 200 [Urine:200]  Alert, conversant, in [employer]owlers position in bed.  Pt reports left lumbar pain since bending over to wash his foot in the shower two weeks ago.  He visited the ED for pain medication, and rec'd prednisone taper that has recently ended. States, "That's why I came back. The medicine didn't help."  He reports left lumbar & left buttock pain with movement of LLE, but denies LLE pain. Strength is good BLE; equal bilat plantar/dorsiflexion. States he is able to ambulate to bathroom with some lumbar pain.  WBC 12.8 after 1-2 weeks p.o. steroids. Smoker.  Lab Results:  Recent Labs  04/25/13 0714 04/25/13 1529  WBC 12.8* 15.1*  HGB 15.5 15.4  HCT 43.0 43.3  PLT 251 262   BMET  Recent Labs  04/25/13 0714  NA 138  K 4.4  CL 105  CO2 25  GLUCOSE 185*  BUN 16  CREATININE 1.02  CALCIUM 8.7    Studies/Results: Dg Lumbar Spine Complete  04/25/2013   CLINICAL DATA:  Back pain for the past week.  EXAM: LUMBAR SPINE - COMPLETE 4+ VIEW  COMPARISON:  No priors.  FINDINGS: Five views of the lumbar spine demonstrate no definite acute displaced fracture or compression type fracture. Mild multilevel degenerative disc disease, most severe at L2-L3. Multilevel facet arthropathy. No defects of the pars interarticularis. Mild dextroscoliosis of the lumbar spine convex to the right at L2-L3. Alignment is otherwise anatomic.  IMPRESSION: 1. No acute radiographic abnormality  of the lumbar spine. 2. Mild multilevel degenerative disc disease, lumbar spondylosis and mild dextroscoliosis, as above.   Electronically Signed   By: Trudie Reed M.D.   On: 04/25/2013 04:14   Ct Lumbar Spine Wo Contrast  04/25/2013   CLINICAL DATA:  Chronic low back pain, failing conservative therapy.  EXAM: CT LUMBAR SPINE WITHOUT CONTRAST  TECHNIQUE: Multidetector CT imaging of the lumbar spine was performed without intravenous contrast administration. Multiplanar CT image reconstructions were also generated.  COMPARISON:  Lumbar spine radiographs April 25, 2013.  FINDINGS: Lumbar vertebral bodies and posterior elements are intact and aligned with maintenance of the lumbar lordosis. Using the reference level of the last well-formed intervertebral disc as L5-S1, mild L2-3 disc height loss, with proportional central endplate spurring, minimal endplate spurring at the remaining lumbar levels. 10 mm sclerotic focus within the posterior aspect of L2 vertebral body may reflect a hemangioma, without aggressive features. Minimal platelike calcification within the ventral epidural space at L2. Included prevertebral and paraspinal soft tissues are nonsuspicious. Mild calcific atherosclerosis the great vessels.  Level by level evaluation:  L1-L2: Small left paracentral broad-based disc osteophyte complex, no canal stenosis or neural foraminal narrowing.  L2-L3: Large broad-based disk bulge. Mild facet arthropathy and ligamentum flavum redundancy. Moderate to severe canal stenosis. Mild neural foraminal narrowing.  L3-L4: Small broad-based disc bulge asymmetric to left. Mild to moderate facet arthropathy and ligamentum flavum redundancy. Moderate canal stenosis. Mild  bilateral neural foraminal narrowing.  L4-L5: Moderate broad-based disc bulge with moderate to severe right, moderate left facet arthropathy and ligamentum flavum redundancy result in at least moderate canal stenosis. Moderate to severe bilateral  neural foraminal narrowing.  L5-S1: Small broad-based disc bulge. Moderate to severe facet arthropathy, left greater than right without canal stenosis. Moderate to severe right, severe left neural foraminal narrowing.  IMPRESSION: No acute fracture or malalignment.  Degenerative change of the lumbar spine, with resultant apparent moderate to severe canal stenosis at L2-3, moderate at L3-4 and L4-5.  Neural foraminal narrowing L2-3 to L5-S1: Severe on the left at L5-S1. Constellation of findings may be better assessed on MRI of the lumbar spine as clinically indicated.   Electronically Signed   By: Awilda Metro   On: 04/25/2013 05:05   Mr Lumbar Spine Wo Contrast  04/25/2013   CLINICAL DATA:  Low back pain with left hip pain  EXAM: MRI LUMBAR SPINE WITHOUT CONTRAST  TECHNIQUE: Multiplanar, multisequence MR imaging was performed. No intravenous contrast was administered.  COMPARISON:  CT lumbar 04/25/2013  FINDINGS: Normal lumbar alignment. Negative for fracture. Conus medullaris has normal signal and terminates at L2-3. 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  Sclerotic lesion is present in the L2 vertebral body on the right posteriorly. This corresponds to the abnormality on CT. There is a mild amount of edema surrounding the sclerotic focus which measures 10 mm. There is some dural calcification posterior to the lesion. No soft tissue mass in the dural space is present. No other bone marrow lesions are identified.  L1-2: Mild disk degeneration. Small left-sided disc protrusion. Mild facet degeneration.  L2-3: Disc degeneration with disk space narrowing and diffuse disc bulging. Moderately large left-sided disc protrusion with impingement of the left L3 nerve root. Possible extruded disk fragment is present on the left. There is facet hypertrophy and moderate to severe spinal stenosis.  L3-4: Disc degeneration with diffuse disc bulging and  spondylosis. Bilateral facet hypertrophy causing moderate spinal stenosis.  L4-5: Disc degeneration with disk bulging and spondylosis. Bilateral facet hypertrophy with moderate to severe spinal stenosis. Moderate foraminal encroachment bilaterally.  L5-S1: Disk bulging and spurring diffusely left greater than right. Bilateral facet hypertrophy left greater than right. Moderate left foraminal encroachment with impingement of the left L5 nerve root. Mild to moderate right foraminal encroachment also noted.  IMPRESSION: Sclerotic lesion in the L2 vertebral body on the right posteriorly corresponding to the CT finding. This appears to be a nonaggressive process there is associated dural calcification at this level. This may be a healed process, perhaps a hemangioma. Followup MRI with contrast in 3-6 months is suggested to assure stability.  Moderately large left paracentral disc protrusion L2-3 with impingement of the left L3 nerve root and moderate to severe spinal stenosis.  Multifactorial spinal stenosis at L3-4 and L4-5. Left foraminal encroachment L5-S1.   Electronically Signed   By: Marlan Palau M.D.   On: 04/25/2013 08:33    Assessment/Plan: Limited by pain  LOS: 0 days  Spoke with pt: Dr. Venetia Maxon will visit to discuss options of ESI vs surgery.  Pt understands need to mobilize for DVTprevention, pneumonia prevention.    Georgiann Cocker 04/25/2013, 4:10 PM

## 2013-04-26 DIAGNOSIS — D72829 Elevated white blood cell count, unspecified: Secondary | ICD-10-CM | POA: Diagnosis present

## 2013-04-26 DIAGNOSIS — I10 Essential (primary) hypertension: Secondary | ICD-10-CM

## 2013-04-26 LAB — CBC
HCT: 41.9 % (ref 39.0–52.0)
Hemoglobin: 14.8 g/dL (ref 13.0–17.0)
MCHC: 35.3 g/dL (ref 30.0–36.0)
MCV: 93.9 fL (ref 78.0–100.0)
RDW: 12.8 % (ref 11.5–15.5)
WBC: 21.7 10*3/uL — ABNORMAL HIGH (ref 4.0–10.5)

## 2013-04-26 LAB — BASIC METABOLIC PANEL
BUN: 13 mg/dL (ref 6–23)
Calcium: 8.7 mg/dL (ref 8.4–10.5)
Chloride: 105 mEq/L (ref 96–112)
Creatinine, Ser: 1.04 mg/dL (ref 0.50–1.35)
GFR calc Af Amer: >90 mL/min (ref >90)
Glucose, Bld: 116 mg/dL — ABNORMAL HIGH (ref 70–99)
Potassium: 3.9 mEq/L (ref 3.5–5.1)

## 2013-04-26 MED ORDER — HYDRALAZINE HCL 20 MG/ML IJ SOLN
10.0000 mg | Freq: Once | INTRAMUSCULAR | Status: AC
  Administered 2013-04-26: 10 mg via INTRAVENOUS
  Filled 2013-04-26: qty 1

## 2013-04-26 MED ORDER — ALUM & MAG HYDROXIDE-SIMETH 200-200-20 MG/5ML PO SUSP
30.0000 mL | ORAL | Status: DC | PRN
  Administered 2013-04-26: 30 mL via ORAL
  Filled 2013-04-26: qty 30

## 2013-04-26 MED ORDER — GI COCKTAIL ~~LOC~~
30.0000 mL | Freq: Once | ORAL | Status: DC
  Filled 2013-04-26: qty 30

## 2013-04-26 NOTE — Progress Notes (Signed)
Subjective: Patient reports "I'm ok"  Objective: Vital signs in last 24 hours: Temp:  [97.9 F (36.6 C)-98.4 F (36.9 C)] 98 F (36.7 C) (11/06 0553) Pulse Rate:  [51-69] 51 (11/06 0553) Resp:  [18-20] 18 (11/06 0553) BP: (138-195)/(42-92) 195/83 mmHg (11/06 0553) SpO2:  [91 %-100 %] 95 % (11/06 0553) Weight:  [85.9 kg (189 lb 6 oz)] 85.9 kg (189 lb 6 oz) (11/05 1313)  Intake/Output from previous day: 11/05 0701 - 11/06 0700 In: 240 [P.O.:240] Out: 1075 [Urine:1075] Intake/Output this shift:    Alert, conversant. No c/o pain at present, but intermittent left lumbar & buttock pain persists. Pt verbalizes understanding of plan for Left L3 SNRB by Interventional Radiology today & f/u with Dr. Venetia Maxon in office.   Lab Results:  Recent Labs  04/25/13 1529 04/26/13 0555  WBC 15.1* 21.7*  HGB 15.4 14.8  HCT 43.3 41.9  PLT 262 249   BMET  Recent Labs  04/25/13 1529 04/26/13 0555  NA 134* 139  K 4.2 3.9  CL 101 105  CO2 23 25  GLUCOSE 213* 116*  BUN 15 13  CREATININE 0.98 1.04  CALCIUM 8.6 8.7    Studies/Results: Dg Lumbar Spine Complete  04/25/2013   CLINICAL DATA:  Back pain for the past week.  EXAM: LUMBAR SPINE - COMPLETE 4+ VIEW  COMPARISON:  No priors.  FINDINGS: Five views of the lumbar spine demonstrate no definite acute displaced fracture or compression type fracture. Mild multilevel degenerative disc disease, most severe at L2-L3. Multilevel facet arthropathy. No defects of the pars interarticularis. Mild dextroscoliosis of the lumbar spine convex to the right at L2-L3. Alignment is otherwise anatomic.  IMPRESSION: 1. No acute radiographic abnormality of the lumbar spine. 2. Mild multilevel degenerative disc disease, lumbar spondylosis and mild dextroscoliosis, as above.   Electronically Signed   By: Trudie Reed M.D.   On: 04/25/2013 04:14   Ct Lumbar Spine Wo Contrast  04/25/2013   CLINICAL DATA:  Chronic low back pain, failing conservative therapy.  EXAM:  CT LUMBAR SPINE WITHOUT CONTRAST  TECHNIQUE: Multidetector CT imaging of the lumbar spine was performed without intravenous contrast administration. Multiplanar CT image reconstructions were also generated.  COMPARISON:  Lumbar spine radiographs April 25, 2013.  FINDINGS: Lumbar vertebral bodies and posterior elements are intact and aligned with maintenance of the lumbar lordosis. Using the reference level of the last well-formed intervertebral disc as L5-S1, mild L2-3 disc height loss, with proportional central endplate spurring, minimal endplate spurring at the remaining lumbar levels. 10 mm sclerotic focus within the posterior aspect of L2 vertebral body may reflect a hemangioma, without aggressive features. Minimal platelike calcification within the ventral epidural space at L2. Included prevertebral and paraspinal soft tissues are nonsuspicious. Mild calcific atherosclerosis the great vessels.  Level by level evaluation:  L1-L2: Small left paracentral broad-based disc osteophyte complex, no canal stenosis or neural foraminal narrowing.  L2-L3: Large broad-based disk bulge. Mild facet arthropathy and ligamentum flavum redundancy. Moderate to severe canal stenosis. Mild neural foraminal narrowing.  L3-L4: Small broad-based disc bulge asymmetric to left. Mild to moderate facet arthropathy and ligamentum flavum redundancy. Moderate canal stenosis. Mild bilateral neural foraminal narrowing.  L4-L5: Moderate broad-based disc bulge with moderate to severe right, moderate left facet arthropathy and ligamentum flavum redundancy result in at least moderate canal stenosis. Moderate to severe bilateral neural foraminal narrowing.  L5-S1: Small broad-based disc bulge. Moderate to severe facet arthropathy, left greater than right without canal stenosis. Moderate to severe  right, severe left neural foraminal narrowing.  IMPRESSION: No acute fracture or malalignment.  Degenerative change of the lumbar spine, with resultant  apparent moderate to severe canal stenosis at L2-3, moderate at L3-4 and L4-5.  Neural foraminal narrowing L2-3 to L5-S1: Severe on the left at L5-S1. Constellation of findings may be better assessed on MRI of the lumbar spine as clinically indicated.   Electronically Signed   By: Awilda Metro   On: 04/25/2013 05:05   Mr Lumbar Spine Wo Contrast  04/25/2013   CLINICAL DATA:  Low back pain with left hip pain  EXAM: MRI LUMBAR SPINE WITHOUT CONTRAST  TECHNIQUE: Multiplanar, multisequence MR imaging was performed. No intravenous contrast was administered.  COMPARISON:  CT lumbar 04/25/2013  FINDINGS: Normal lumbar alignment. Negative for fracture. Conus medullaris has normal signal and terminates at L2-3. 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  Sclerotic lesion is present in the L2 vertebral body on the right posteriorly. This corresponds to the abnormality on CT. There is a mild amount of edema surrounding the sclerotic focus which measures 10 mm. There is some dural calcification posterior to the lesion. No soft tissue mass in the dural space is present. No other bone marrow lesions are identified.  L1-2: Mild disk degeneration. Small left-sided disc protrusion. Mild facet degeneration.  L2-3: Disc degeneration with disk space narrowing and diffuse disc bulging. Moderately large left-sided disc protrusion with impingement of the left L3 nerve root. Possible extruded disk fragment is present on the left. There is facet hypertrophy and moderate to severe spinal stenosis.  L3-4: Disc degeneration with diffuse disc bulging and spondylosis. Bilateral facet hypertrophy causing moderate spinal stenosis.  L4-5: Disc degeneration with disk bulging and spondylosis. Bilateral facet hypertrophy with moderate to severe spinal stenosis. Moderate foraminal encroachment bilaterally.  L5-S1: Disk bulging and spurring diffusely left greater than right. Bilateral  facet hypertrophy left greater than right. Moderate left foraminal encroachment with impingement of the left L5 nerve root. Mild to moderate right foraminal encroachment also noted.  IMPRESSION: Sclerotic lesion in the L2 vertebral body on the right posteriorly corresponding to the CT finding. This appears to be a nonaggressive process there is associated dural calcification at this level. This may be a healed process, perhaps a hemangioma. Followup MRI with contrast in 3-6 months is suggested to assure stability.  Moderately large left paracentral disc protrusion L2-3 with impingement of the left L3 nerve root and moderate to severe spinal stenosis.  Multifactorial spinal stenosis at L3-4 and L4-5. Left foraminal encroachment L5-S1.   Electronically Signed   By: Marlan Palau M.D.   On: 04/25/2013 08:33    Assessment/Plan: Stable   LOS: 1 day  Mobilize with PT, Left L3 SNRB; f/u with Dr. Venetia Maxon as outpatient.   Georgiann Cocker 04/26/2013, 8:08 AM

## 2013-04-26 NOTE — Progress Notes (Signed)
PT Cancellation Note  Patient Details Name: Mamoudou Mulvehill MRN: [id number] DOB: May 29, 1956   Cancelled Treatment:    Reason Eval/Treat Not Completed: Other (comment)  Spoke with pt about order for PT eval, however pt still waiting to go to IR for L3 SNRB, which pt is hoping will improve his mobility.  Will hold PT eval at this time pending SNRB and check back another time.     Sunny Schlein, [place] [phone] 04/26/2013, 2:24 PM

## 2013-04-26 NOTE — Progress Notes (Signed)
Utilization review completed. Avanthika Dehnert, RN, BSN. 

## 2013-04-26 NOTE — Progress Notes (Signed)
[facility]                                                                                Patient Demographics  Logan Brewer, is a 57 y.o. male, DOB - October 08, 1955, JYN:[id number]  Admit date - 04/25/2013   Admitting Physician Eduard Clos, MD  Outpatient Primary MD for the patient is No PCP Per Patient  LOS - 1   Chief Complaint  Patient presents with  . Back Pain        Assessment & Plan    Active Problems:   Radiculopathy, lumbosacral region   Back pain   Anxiety  Lower Back Pain/Spinal Stenosis -Due to protrusion of L2 and L3 with impingement of the L3 nerve root -Patient is due to have an L3 nerve root block by interventional radiology today -Will continue pain control.  Hypertension Currently uncontrolled. Will continue Norvasc with when necessary hydralazine.  Leukocytosis Patient is afebrile no source of infection. This is likely an acute phase reactant. Will continue to monitor  Code Status: Full  Family Communication: None  Disposition Plan: Admitted.  Procedure planned for today.   Procedures  Left L3 Nerve Root Block  Consults   Neurosurgery  DVT Prophylaxis  Lovenox  Lab Results  Component Value Date   PLT 249 04/26/2013    Medications  Scheduled Meds: . amLODipine  5 mg Oral Daily  . docusate sodium  100 mg Oral BID  . enoxaparin (LOVENOX) injection  40 mg Subcutaneous Q24H  . lidocaine  1 patch Transdermal Q24H  . predniSONE  60 mg Oral Q breakfast   Continuous Infusions: . sodium chloride 75 mL/hr at 04/26/13 0400   PRN Meds:.acetaminophen, acetaminophen, diazepam, HYDROmorphone (DILAUDID) injection, oxyCODONE, sorbitol  Antibiotics   Anti-infectives   None     Time Spent in minutes   30 minutes   Sinahi Knights D.O. on 04/26/2013 at 1:42 PM  Between 7am to 7pm - Pager - [phone]  After 7pm go to [email or web address] - password [email or web address]  And look for the night coverage person covering for me after  hours  [facility] Office  [phone]    Subjective:   Logan Brewer seen and examined today. Patient states he feels his back is about the same since being admitted.  He feels pain in his lower back.  He currently denies dizziness, chest pain, shortness of breath, abdominal pain, N/V/D/C, new weakness, numbess, tingling.    Objective:   Filed Vitals:   04/25/13 2052 04/26/13 0150 04/26/13 0553 04/26/13 0933  BP: 170/74 171/81 195/83 174/80  Pulse: 57 58 51 84  Temp: 98.2 F (36.8 C) 98.4 F (36.9 C) 98 F (36.7 C) 98.4 F (36.9 C)  TempSrc: Oral Oral Oral Oral  Resp: 18 18 18 18   Height:      Weight:      SpO2: 98% 99% 95% 98%    Wt Readings from Last 3 Encounters:  04/25/13 85.9 kg (189 lb 6 oz)     Intake/Output Summary (Last 24 hours) at 04/26/13 1342 Last data filed at 04/26/13 1132  Gross per 24 hour  Intake    840 ml  Output  1875 ml  Net  -1035 ml    Exam  General: Well developed, well nourished, NAD, appears stated age  HEENT: NCAT, PERRLA, EOMI, Anicteic Sclera, mucous membranes moist.Neck: Supple, no JVD, no masses  Cardiovascular: S1 S2 auscultated, no rubs, murmurs or gallops. Regular rate and rhythm.  Respiratory: Clear to auscultation bilaterally with equal chest rise  Abdomen: Soft, nontender, nondistended, + bowel sounds  Back: No pain elicited with palpation of the lumbar area  Extremities: warm dry without cyanosis clubbing or edema  Neuro: AAOx3, cranial nerves grossly intact. Strength 5/5 in patient's upper and lower extremities bilaterally  Skin: Without rashes exudates or nodules  Psych: Normal affect and demeanor with intact judgement and insight  Data Review   Micro Results No results found for this or any previous visit (from the past 240 hour(s)).  Radiology Reports Dg Lumbar Spine Complete  04/25/2013   CLINICAL DATA:  Back pain for the past week.  EXAM: LUMBAR SPINE - COMPLETE 4+ VIEW  COMPARISON:  No  priors.  FINDINGS: Five views of the lumbar spine demonstrate no definite acute displaced fracture or compression type fracture. Mild multilevel degenerative disc disease, most severe at L2-L3. Multilevel facet arthropathy. No defects of the pars interarticularis. Mild dextroscoliosis of the lumbar spine convex to the right at L2-L3. Alignment is otherwise anatomic.  IMPRESSION: 1. No acute radiographic abnormality of the lumbar spine. 2. Mild multilevel degenerative disc disease, lumbar spondylosis and mild dextroscoliosis, as above.   Electronically Signed   By: Trudie Reed M.D.   On: 04/25/2013 04:14   Ct Lumbar Spine Wo Contrast  04/25/2013   CLINICAL DATA:  Chronic low back pain, failing conservative therapy.  EXAM: CT LUMBAR SPINE WITHOUT CONTRAST  TECHNIQUE: Multidetector CT imaging of the lumbar spine was performed without intravenous contrast administration. Multiplanar CT image reconstructions were also generated.  COMPARISON:  Lumbar spine radiographs April 25, 2013.  FINDINGS: Lumbar vertebral bodies and posterior elements are intact and aligned with maintenance of the lumbar lordosis. Using the reference level of the last well-formed intervertebral disc as L5-S1, mild L2-3 disc height loss, with proportional central endplate spurring, minimal endplate spurring at the remaining lumbar levels. 10 mm sclerotic focus within the posterior aspect of L2 vertebral body may reflect a hemangioma, without aggressive features. Minimal platelike calcification within the ventral epidural space at L2. Included prevertebral and paraspinal soft tissues are nonsuspicious. Mild calcific atherosclerosis the great vessels.  Level by level evaluation:  L1-L2: Small left paracentral broad-based disc osteophyte complex, no canal stenosis or neural foraminal narrowing.  L2-L3: Large broad-based disk bulge. Mild facet arthropathy and ligamentum flavum redundancy. Moderate to severe canal stenosis. Mild neural foraminal  narrowing.  L3-L4: Small broad-based disc bulge asymmetric to left. Mild to moderate facet arthropathy and ligamentum flavum redundancy. Moderate canal stenosis. Mild bilateral neural foraminal narrowing.  L4-L5: Moderate broad-based disc bulge with moderate to severe right, moderate left facet arthropathy and ligamentum flavum redundancy result in at least moderate canal stenosis. Moderate to severe bilateral neural foraminal narrowing.  L5-S1: Small broad-based disc bulge. Moderate to severe facet arthropathy, left greater than right without canal stenosis. Moderate to severe right, severe left neural foraminal narrowing.  IMPRESSION: No acute fracture or malalignment.  Degenerative change of the lumbar spine, with resultant apparent moderate to severe canal stenosis at L2-3, moderate at L3-4 and L4-5.  Neural foraminal narrowing L2-3 to L5-S1: Severe on the left at L5-S1. Constellation of findings may be better assessed on MRI  of the lumbar spine as clinically indicated.   Electronically Signed   By: Awilda Metro   On: 04/25/2013 05:05   Mr Lumbar Spine Wo Contrast  04/25/2013   CLINICAL DATA:  Low back pain with left hip pain  EXAM: MRI LUMBAR SPINE WITHOUT CONTRAST  TECHNIQUE: Multiplanar, multisequence MR imaging was performed. No intravenous contrast was administered.  COMPARISON:  CT lumbar 04/25/2013  FINDINGS: Normal lumbar alignment. Negative for fracture. Conus medullaris has normal signal and terminates at L2-3. 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  Sclerotic lesion is present in the L2 vertebral body on the right posteriorly. This corresponds to the abnormality on CT. There is a mild amount of edema surrounding the sclerotic focus which measures 10 mm. There is some dural calcification posterior to the lesion. No soft tissue mass in the dural space is present. No other bone marrow lesions are identified.  L1-2: Mild disk  degeneration. Small left-sided disc protrusion. Mild facet degeneration.  L2-3: Disc degeneration with disk space narrowing and diffuse disc bulging. Moderately large left-sided disc protrusion with impingement of the left L3 nerve root. Possible extruded disk fragment is present on the left. There is facet hypertrophy and moderate to severe spinal stenosis.  L3-4: Disc degeneration with diffuse disc bulging and spondylosis. Bilateral facet hypertrophy causing moderate spinal stenosis.  L4-5: Disc degeneration with disk bulging and spondylosis. Bilateral facet hypertrophy with moderate to severe spinal stenosis. Moderate foraminal encroachment bilaterally.  L5-S1: Disk bulging and spurring diffusely left greater than right. Bilateral facet hypertrophy left greater than right. Moderate left foraminal encroachment with impingement of the left L5 nerve root. Mild to moderate right foraminal encroachment also noted.  IMPRESSION: Sclerotic lesion in the L2 vertebral body on the right posteriorly corresponding to the CT finding. This appears to be a nonaggressive process there is associated dural calcification at this level. This may be a healed process, perhaps a hemangioma. Followup MRI with contrast in 3-6 months is suggested to assure stability.  Moderately large left paracentral disc protrusion L2-3 with impingement of the left L3 nerve root and moderate to severe spinal stenosis.  Multifactorial spinal stenosis at L3-4 and L4-5. Left foraminal encroachment L5-S1.   Electronically Signed   By: Marlan Palau M.D.   On: 04/25/2013 08:33    CBC  Recent Labs Lab 04/25/13 0714 04/25/13 1529 04/26/13 0555  WBC 12.8* 15.1* 21.7*  HGB 15.5 15.4 14.8  HCT 43.0 43.3 41.9  PLT 251 262 249  MCV 94.3 94.1 93.9  MCH 34.0 33.5 33.2  MCHC 36.0 35.6 35.3  RDW 12.8 12.9 12.8  LYMPHSABS 1.9  --   --   MONOABS 0.2  --   --   EOSABS 0.0  --   --   BASOSABS 0.0  --   --     Chemistries   Recent Labs Lab  04/25/13 0714 04/25/13 1529 04/26/13 0555  NA 138 134* 139  K 4.4 4.2 3.9  CL 105 101 105  CO2 25 23 25   GLUCOSE 185* 213* 116*  BUN 16 15 13   CREATININE 1.02 0.98 1.04  CALCIUM 8.7 8.6 8.7  AST 12  --   --   ALT 14  --   --   ALKPHOS 74  --   --   BILITOT 0.2*  --   --    ------------------------------------------------------------------------------------------------------------------ estimated creatinine clearance is 85.6 ml/min (by C-G formula based on Cr  of 1.04). ------------------------------------------------------------------------------------------------------------------ No results found for this basename: HGBA1C,  in the last 72 hours ------------------------------------------------------------------------------------------------------------------ No results found for this basename: CHOL, HDL, LDLCALC, TRIG, CHOLHDL, LDLDIRECT,  in the last 72 hours ------------------------------------------------------------------------------------------------------------------ No results found for this basename: TSH, T4TOTAL, FREET3, T3FREE, THYROIDAB,  in the last 72 hours ------------------------------------------------------------------------------------------------------------------ No results found for this basename: VITAMINB12, FOLATE, FERRITIN, TIBC, IRON, RETICCTPCT,  in the last 72 hours  Coagulation profile No results found for this basename: INR, PROTIME,  in the last 168 hours  No results found for this basename: DDIMER,  in the last 72 hours  Cardiac Enzymes No results found for this basename: CK, CKMB, TROPONINI, MYOGLOBIN,  in the last 168 hours ------------------------------------------------------------------------------------------------------------------ No components found with this basename: POCBNP,

## 2013-04-27 ENCOUNTER — Inpatient Hospital Stay (HOSPITAL_COMMUNITY): Payer: Self-pay

## 2013-04-27 LAB — CBC
HCT: 47.2 % (ref 39.0–52.0)
Hemoglobin: 17.2 g/dL — ABNORMAL HIGH (ref 13.0–17.0)
MCH: 33.7 pg (ref 26.0–34.0)
MCHC: 36.4 g/dL — ABNORMAL HIGH (ref 30.0–36.0)
MCV: 92.4 fL (ref 78.0–100.0)
RBC: 5.11 MIL/uL (ref 4.22–5.81)
RDW: 12.7 % (ref 11.5–15.5)

## 2013-04-27 MED ORDER — METHYLPREDNISOLONE ACETATE 40 MG/ML IJ SUSP
INTRAMUSCULAR | Status: AC
  Administered 2013-04-27: 40 mg via INTRAMUSCULAR
  Filled 2013-04-27: qty 5

## 2013-04-27 MED ORDER — LISINOPRIL 20 MG PO TABS: 20.0000 mg | ORAL_TABLET | Freq: Every day | ORAL | Status: DC

## 2013-04-27 MED ORDER — AMLODIPINE BESYLATE 5 MG PO TABS: 5.0000 mg | ORAL_TABLET | Freq: Every day | ORAL | Status: DC

## 2013-04-27 MED ORDER — METHYLPREDNISOLONE ACETATE 80 MG/ML IJ SUSP
INTRAMUSCULAR | Status: AC
  Administered 2013-04-27: 80 mg via INTRAMUSCULAR
  Filled 2013-04-27: qty 1

## 2013-04-27 MED ORDER — LISINOPRIL 20 MG PO TABS
20.0000 mg | ORAL_TABLET | Freq: Every day | ORAL | Status: DC
  Administered 2013-04-27: 20 mg via ORAL
  Filled 2013-04-27: qty 1

## 2013-04-27 MED ORDER — METHYLPREDNISOLONE ACETATE 80 MG/ML IJ SUSP
80.0000 mg | Freq: Once | INTRAMUSCULAR | Status: AC
  Administered 2013-04-27: 80 mg via INTRAMUSCULAR

## 2013-04-27 MED ORDER — HYDRALAZINE HCL 20 MG/ML IJ SOLN: 10.0000 mg | INTRAMUSCULAR | Status: DC | PRN

## 2013-04-27 MED ORDER — HYDRALAZINE HCL 20 MG/ML IJ SOLN
10.0000 mg | Freq: Once | INTRAMUSCULAR | Status: AC
  Administered 2013-04-27: 10 mg via INTRAVENOUS
  Filled 2013-04-27: qty 1

## 2013-04-27 MED ORDER — HYDRALAZINE HCL 20 MG/ML IJ SOLN
10.0000 mg | Freq: Once | INTRAMUSCULAR | Status: AC
  Administered 2013-04-27: 10 mg via INTRAVENOUS

## 2013-04-27 MED ORDER — IOHEXOL 180 MG/ML  SOLN
20.0000 mL | Freq: Once | INTRAMUSCULAR | Status: AC | PRN
  Administered 2013-04-27: 3 mL via INTRAVENOUS

## 2013-04-27 MED ORDER — METHYLPREDNISOLONE ACETATE 40 MG/ML IJ SUSP
40.0000 mg | Freq: Once | INTRAMUSCULAR | Status: AC
  Administered 2013-04-27: 40 mg via INTRAMUSCULAR

## 2013-04-27 MED ORDER — HYDRALAZINE HCL 20 MG/ML IJ SOLN
INTRAMUSCULAR | Status: AC
  Filled 2013-04-27: qty 1

## 2013-04-27 NOTE — Progress Notes (Signed)
Pt returned after L3 NRB, no complaints of pain. Informed the attending physician, & with the order assisted the patient to mobilize in the hallway. Walked with no complaints of pain or distress. Danne [place]

## 2013-04-27 NOTE — Evaluation (Signed)
Physical Therapy Evaluation Patient Details Name: Logan Brewer MRN: [id number] DOB: 05/09/1956 Today's Date: 04/27/2013 Time: 7829-5621 PT Time Calculation (min): 21 min  PT Assessment / Plan / Recommendation History of Present Illness  Patient is a 57 yo male admitted with back pain.  Patient with left L3 nerve root compression.  Patient s/p  SNRB in IR today - much relief per patient.  Clinical Impression  Patient reports much relief with nerve block procedure.  Patient is independent with mobility and gait.  No acute PT needs identified - patient ready for discharge from PT perspective.    PT Assessment  Patent does not need any further PT services    Follow Up Recommendations  No PT follow up;Supervision - Intermittent    Does the patient have the potential to tolerate intense rehabilitation      Barriers to Discharge        Equipment Recommendations  None recommended by PT    Recommendations for Other Services     Frequency      Precautions / Restrictions Precautions Precautions: None Restrictions Weight Bearing Restrictions: No   Pertinent Vitals/Pain       Mobility  Bed Mobility Bed Mobility: Not assessed Transfers Transfers: Sit to Stand;Stand to Sit Sit to Stand: 6: Modified independent (Device/Increase time) Stand to Sit: 6: Modified independent (Device/Increase time) Details for Transfer Assistance: Increased time Ambulation/Gait Ambulation/Gait Assistance: 6: Modified independent (Device/Increase time) Ambulation Distance (Feet): 200 Feet Assistive device: None Ambulation/Gait Assistance Details: No physical assist needed.  Patient with increased time for gait.  Gait very slow and guarded (decreased rotation) Gait Pattern: Step-through pattern;Decreased stride length;Decreased trunk rotation Gait velocity: Slow gait speed General Gait Details: Patient with slow and guarded gait.  Balance and safety good. Stairs: Yes Stairs Assistance: 6: Modified  independent (Device/Increase time) Stair Management Technique: One rail Right;Alternating pattern;Forwards Number of Stairs: 4        PT Goals(Current goals can be found in the care plan section)  N/A  Visit Information  Last PT Received On: 04/27/13 Assistance Needed: +1 History of Present Illness: Patient is a 57 yo male admitted with back pain.  Patient with left L3 nerve root compression.  Patient s/p  SNRB in IR today - much relief per patient.       Prior Functioning  Home Living Family/patient expects to be discharged to:: Private residence Living Arrangements: Spouse/significant other Available Help at Discharge: Family;Available 24 hours/day Type of Home: Apartment Home Access: Stairs to enter Entrance Stairs-Number of Steps: 4 Entrance Stairs-Rails: Right;Left Home Layout: One level Home Equipment: None Prior Function Level of Independence: Independent Communication Communication: No difficulties    Cognition  Cognition Arousal/Alertness: Awake/alert Behavior During Therapy: WFL for tasks assessed/performed Overall Cognitive Status: Within Functional Limits for tasks assessed    Extremity/Trunk Assessment Upper Extremity Assessment Upper Extremity Assessment: Overall WFL for tasks assessed Lower Extremity Assessment Lower Extremity Assessment: Overall WFL for tasks assessed   Balance    End of Session PT - End of Session Activity Tolerance: Patient tolerated treatment well Patient left: in chair;with call bell/phone within reach;with family/visitor present Nurse Communication: Mobility status  GP     Vena Austria 04/27/2013, 5:30 PM Durenda Hurt. Renaldo Fiddler, [facility] Acute Rehab Services Pager [phone]

## 2013-04-27 NOTE — Procedures (Signed)
See radiology dictation.   

## 2013-04-27 NOTE — Progress Notes (Signed)
I took over patient care at 0000. Was told in report that patient is scheduled for nerve block today. No orders to keep NPO. Spoke to interventional radiology and they were unaware if patient should be NPO for the procedure or not. Per nursing judgement, pt has been kept NPO since 0230. Patient agreeable. Will report to am nurse.

## 2013-04-27 NOTE — H&P (Signed)
Logan Brewer is an 57 y.o. male.   Chief Complaint: while in shower 1 week ago Pt reached down to feet and developed sudden onset of low back pain Radiates to L hip Does not radiate any further- no knee or foot pain MRI reveals Lumbar 3 nerve impingement and spinal stenosis Request made for NRB at Lumbar 3  HPI: back pain; HTN  Past Medical History  Diagnosis Date  . Hypertension     Past Surgical History  Procedure Laterality Date  . Appendectomy      History reviewed. No pertinent family history. Social History:  reports that he has been smoking.  He does not have any smokeless tobacco history on file. He reports that he drinks alcohol. He reports that he does not use illicit drugs.  Allergies: No Known Allergies  Medications Prior to Admission  Medication Sig Dispense Refill  . diazepam (VALIUM) 5 MG tablet Take 1 tablet (5 mg total) by mouth every 12 (twelve) hours as needed (spasm).  15 tablet  0  . predniSONE (DELTASONE) 20 MG tablet Take 3 tablets (60 mg total) by mouth daily.  12 tablet  0    Results for orders placed during the hospital encounter of 04/25/13 (from the past 48 hour(s))  URINALYSIS, ROUTINE W REFLEX MICROSCOPIC     Status: None   Collection Time    04/25/13 12:37 PM      Result Value Range   Color, Urine YELLOW  YELLOW   APPearance CLEAR  CLEAR   Specific Gravity, Urine 1.018  1.005 - 1.030   pH 6.0  5.0 - 8.0   Glucose, UA NEGATIVE  NEGATIVE mg/dL   Hgb urine dipstick NEGATIVE  NEGATIVE   Bilirubin Urine NEGATIVE  NEGATIVE   Ketones, ur NEGATIVE  NEGATIVE mg/dL   Protein, ur NEGATIVE  NEGATIVE mg/dL   Urobilinogen, UA 1.0  0.0 - 1.0 mg/dL   Nitrite NEGATIVE  NEGATIVE   Leukocytes, UA NEGATIVE  NEGATIVE   Comment: MICROSCOPIC NOT DONE ON URINES WITH NEGATIVE PROTEIN, BLOOD, LEUKOCYTES, NITRITE, OR GLUCOSE <1000 mg/dL.  CBC     Status: Abnormal   Collection Time    04/25/13  3:29 PM      Result Value Range   WBC 15.1 (*) 4.0 - 10.5 K/uL    RBC 4.60  4.22 - 5.81 MIL/uL   Hemoglobin 15.4  13.0 - 17.0 g/dL   HCT 16.1  09.6 - 04.5 %   MCV 94.1  78.0 - 100.0 fL   MCH 33.5  26.0 - 34.0 pg   MCHC 35.6  30.0 - 36.0 g/dL   RDW 40.9  81.1 - 91.4 %   Platelets 262  150 - 400 K/uL  BASIC METABOLIC PANEL     Status: Abnormal   Collection Time    04/25/13  3:29 PM      Result Value Range   Sodium 134 (*) 135 - 145 mEq/L   Potassium 4.2  3.5 - 5.1 mEq/L   Chloride 101  96 - 112 mEq/L   CO2 23  19 - 32 mEq/L   Glucose, Bld 213 (*) 70 - 99 mg/dL   BUN 15  6 - 23 mg/dL   Creatinine, Ser 7.82  0.50 - 1.35 mg/dL   Calcium 8.6  8.4 - 95.6 mg/dL   GFR calc non Af Amer >90  >90 mL/min   GFR calc Af Amer >90  >90 mL/min   Comment: (NOTE)     The eGFR  has been calculated using the CKD EPI equation.     This calculation has not been validated in all clinical situations.     eGFR's persistently <90 mL/min signify possible Chronic Kidney     Disease.  CBC     Status: Abnormal   Collection Time    04/26/13  5:55 AM      Result Value Range   WBC 21.7 (*) 4.0 - 10.5 K/uL   RBC 4.46  4.22 - 5.81 MIL/uL   Hemoglobin 14.8  13.0 - 17.0 g/dL   HCT 84.1  32.4 - 40.1 %   MCV 93.9  78.0 - 100.0 fL   MCH 33.2  26.0 - 34.0 pg   MCHC 35.3  30.0 - 36.0 g/dL   RDW 02.7  25.3 - 66.4 %   Platelets 249  150 - 400 K/uL  BASIC METABOLIC PANEL     Status: Abnormal   Collection Time    04/26/13  5:55 AM      Result Value Range   Sodium 139  135 - 145 mEq/L   Potassium 3.9  3.5 - 5.1 mEq/L   Chloride 105  96 - 112 mEq/L   CO2 25  19 - 32 mEq/L   Glucose, Bld 116 (*) 70 - 99 mg/dL   BUN 13  6 - 23 mg/dL   Creatinine, Ser 4.03  0.50 - 1.35 mg/dL   Calcium 8.7  8.4 - 47.4 mg/dL   GFR calc non Af Amer 79 (*) >90 mL/min   GFR calc Af Amer >90  >90 mL/min   Comment: (NOTE)     The eGFR has been calculated using the CKD EPI equation.     This calculation has not been validated in all clinical situations.     eGFR's persistently <90 mL/min signify  possible Chronic Kidney     Disease.   No results found.  Review of Systems  Constitutional: Negative for fever.  Respiratory: Negative for cough.   Gastrointestinal: Negative for vomiting and abdominal pain.  Musculoskeletal: Positive for back pain.  Neurological: Negative for dizziness, weakness and headaches.    Blood pressure 203/67, pulse 82, temperature 98 F (36.7 C), temperature source Axillary, resp. rate 18, height 5\' 8"  (1.727 m), weight 189 lb 6 oz (85.9 kg), SpO2 98.00%. Physical Exam  Constitutional: He is oriented to person, place, and time. He appears well-nourished.  Cardiovascular: Normal rate and regular rhythm.   Respiratory: Effort normal and breath sounds normal.  GI: Soft. Bowel sounds are normal.  Musculoskeletal: Normal range of motion. He exhibits no edema and no tenderness.  Low back pain to L hip  Neurological: He is alert and oriented to person, place, and time. Coordination normal.  Skin: Skin is dry.  Psychiatric: He has a normal mood and affect. His behavior is normal. Judgment and thought content normal.     Assessment/Plan Low Back pain to L hip MRI reveals spinal stenosis and L3 nerve impingement Scheduled for L 3 NRB Pt aware of procedure benefits and risks and agreeable to proceed Consent signed and in chart  Logan Brewer A 04/27/2013, 11:14 AM

## 2013-04-27 NOTE — Progress Notes (Signed)
PT Cancellation Note  Patient Details Name: Logan Brewer MRN: [id number] DOB: 1956-04-03   Cancelled Treatment:    Reason Eval/Treat Not Completed: Other (comment).  Pt is still awaiting SNRB.  Therapy eval would be most beneficial after SNRB completed.  Will check back later.     Jyoti Harju, Alison Murray 04/27/2013, 10:51 AM

## 2013-04-27 NOTE — Discharge Summary (Signed)
Physician Discharge Summary  Logan Brewer XBJ:[id number] DOB: 08-28-1955 DOA: 04/25/2013  PCP: No PCP Per Patient  Admit date: 04/25/2013 Discharge date: 04/27/2013  Time spent: 35 minutes  Recommendations for Outpatient Follow-up:  Patient will need to follow up with his primary care physician within one week of discharge for discussion of his hypertension and medications.  He will also need to follow up with Dr. Venetia Maxon.  Patient was advised to be compliant with medications as well as appointments.  Discharge Diagnoses:  Active Problems:   Radiculopathy, lumbosacral region   Back pain   Anxiety   HTN (hypertension)   Leukocytosis, unspecified   Discharge Condition: Stable  Diet recommendation: Heart Healthy  Filed Weights   04/25/13 1313  Weight: 85.9 kg (189 lb 6 oz)    History of present illness:  Logan Brewer is a 57 y.o. male with no significant past medical history who presented to the emergency department overnight with complaints of lower back pain. He states that symptoms started last Thursday when he leaned over in the shower. He described experiencing severe sudden onset left lower back pain. He was seen in the emergency room on 04/21/2013 as his back pain was felt to be non-emergent, without loss of bowel or bladder control. Patient did not report fevers chills night sweats. He was managed conservatively and discharged from emergency department. He came in again overnight with ongoing back pain. He states symptoms are localized to his left lower back, nonradiating. He denies focal weakness or alteration to sensation and lower extremities. Symptoms are aggravated by sitting up or movement of his back. He denies urinary and bowel incontinence. Case was discussed with Dr. Venetia Maxon.   Hospital Course:  This is 57 year old male with a history of hypertension (on no home medications) that presented to the ED for lower back pain and was found to have protrusion of L2 on L3 with  impingement of the L3 nerve root.  Dr. Venetia Maxon was contacted by the admitting physician and recommended nerve block by interventional radiology. Patient is to follow up with Dr. Venetia Maxon as an outpatient.  Interventional radiology was contacted for nerve root block.  Patient underwent the procedure without complications.    Patient has uncontrolled hypertension and is currently on no medications at home.  He was started on amlodipine as well as lisinopril.  He will need outpatient follow up and monitoring.  His blood pressure may have also been elevated secondary to pain.    He was also noted to have leukocytosis on admission, which has been monitored and is trending downward.  Likely an acute phase reactant to pain.  Patient does not have any source of infection and is afebrile.   Patient was seen and examined on day of discharge and is stable.  He will need close follow up with his primary care physician as well as Dr. Venetia Maxon.  This has been discussed with the patient.    Procedures: Left L3 Nerve Root Block   Consultations: IR Neurosurgery  Discharge Exam: Filed Vitals:   04/27/13 1346  BP: 186/76  Pulse: 103  Temp:   Resp:     Exam  General: Well developed, well nourished, NAD, appears stated age  HEENT: NCAT, PERRLA, EOMI, Anicteic Sclera, mucous membranes moist. Neck: Supple, no JVD, no masses  Cardiovascular: S1 S2 auscultated, no rubs, murmurs or gallops. Regular rate and rhythm.  Respiratory: Clear to auscultation bilaterally with equal chest rise  Abdomen: Soft, nontender, nondistended, + bowel sounds  Back: No pain elicited with palpation of the lumbar area  Extremities: warm dry without cyanosis clubbing or edema  Neuro: AAOx3, cranial nerves grossly intact. Strength 5/5 in patient's upper and lower extremities bilaterally  Skin: Without rashes exudates or nodules  Psych: Normal affect and demeanor with intact judgement and insight  Discharge Instructions     Medication  List    ASK your doctor about these medications       diazepam 5 MG tablet  Commonly known as:  VALIUM  Take 1 tablet (5 mg total) by mouth every 12 (twelve) hours as needed (spasm).     predniSONE 20 MG tablet  Commonly known as:  DELTASONE  Take 3 tablets (60 mg total) by mouth daily.       No Known Allergies    The results of significant diagnostics from this hospitalization (including imaging, microbiology, ancillary and laboratory) are listed below for reference.    Significant Diagnostic Studies: Dg Lumbar Spine Complete  04/25/2013   CLINICAL DATA:  Back pain for the past week.  EXAM: LUMBAR SPINE - COMPLETE 4+ VIEW  COMPARISON:  No priors.  FINDINGS: Five views of the lumbar spine demonstrate no definite acute displaced fracture or compression type fracture. Mild multilevel degenerative disc disease, most severe at L2-L3. Multilevel facet arthropathy. No defects of the pars interarticularis. Mild dextroscoliosis of the lumbar spine convex to the right at L2-L3. Alignment is otherwise anatomic.  IMPRESSION: 1. No acute radiographic abnormality of the lumbar spine. 2. Mild multilevel degenerative disc disease, lumbar spondylosis and mild dextroscoliosis, as above.   Electronically Signed   By: Trudie Reed M.D.   On: 04/25/2013 04:14   Ct Lumbar Spine Wo Contrast  04/25/2013   CLINICAL DATA:  Chronic low back pain, failing conservative therapy.  EXAM: CT LUMBAR SPINE WITHOUT CONTRAST  TECHNIQUE: Multidetector CT imaging of the lumbar spine was performed without intravenous contrast administration. Multiplanar CT image reconstructions were also generated.  COMPARISON:  Lumbar spine radiographs April 25, 2013.  FINDINGS: Lumbar vertebral bodies and posterior elements are intact and aligned with maintenance of the lumbar lordosis. Using the reference level of the last well-formed intervertebral disc as L5-S1, mild L2-3 disc height loss, with proportional central endplate spurring,  minimal endplate spurring at the remaining lumbar levels. 10 mm sclerotic focus within the posterior aspect of L2 vertebral body may reflect a hemangioma, without aggressive features. Minimal platelike calcification within the ventral epidural space at L2. Included prevertebral and paraspinal soft tissues are nonsuspicious. Mild calcific atherosclerosis the great vessels.  Level by level evaluation:  L1-L2: Small left paracentral broad-based disc osteophyte complex, no canal stenosis or neural foraminal narrowing.  L2-L3: Large broad-based disk bulge. Mild facet arthropathy and ligamentum flavum redundancy. Moderate to severe canal stenosis. Mild neural foraminal narrowing.  L3-L4: Small broad-based disc bulge asymmetric to left. Mild to moderate facet arthropathy and ligamentum flavum redundancy. Moderate canal stenosis. Mild bilateral neural foraminal narrowing.  L4-L5: Moderate broad-based disc bulge with moderate to severe right, moderate left facet arthropathy and ligamentum flavum redundancy result in at least moderate canal stenosis. Moderate to severe bilateral neural foraminal narrowing.  L5-S1: Small broad-based disc bulge. Moderate to severe facet arthropathy, left greater than right without canal stenosis. Moderate to severe right, severe left neural foraminal narrowing.  IMPRESSION: No acute fracture or malalignment.  Degenerative change of the lumbar spine, with resultant apparent moderate to severe canal stenosis at L2-3, moderate at L3-4 and L4-5.  Neural foraminal narrowing  L2-3 to L5-S1: Severe on the left at L5-S1. Constellation of findings may be better assessed on MRI of the lumbar spine as clinically indicated.   Electronically Signed   By: Awilda Metro   On: 04/25/2013 05:05   Mr Lumbar Spine Wo Contrast  04/25/2013   CLINICAL DATA:  Low back pain with left hip pain  EXAM: MRI LUMBAR SPINE WITHOUT CONTRAST  TECHNIQUE: Multiplanar, multisequence MR imaging was performed. No intravenous  contrast was administered.  COMPARISON:  CT lumbar 04/25/2013  FINDINGS: Normal lumbar alignment. Negative for fracture. Conus medullaris has normal signal and terminates at L2-3. 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  Sclerotic lesion is present in the L2 vertebral body on the right posteriorly. This corresponds to the abnormality on CT. There is a mild amount of edema surrounding the sclerotic focus which measures 10 mm. There is some dural calcification posterior to the lesion. No soft tissue mass in the dural space is present. No other bone marrow lesions are identified.  L1-2: Mild disk degeneration. Small left-sided disc protrusion. Mild facet degeneration.  L2-3: Disc degeneration with disk space narrowing and diffuse disc bulging. Moderately large left-sided disc protrusion with impingement of the left L3 nerve root. Possible extruded disk fragment is present on the left. There is facet hypertrophy and moderate to severe spinal stenosis.  L3-4: Disc degeneration with diffuse disc bulging and spondylosis. Bilateral facet hypertrophy causing moderate spinal stenosis.  L4-5: Disc degeneration with disk bulging and spondylosis. Bilateral facet hypertrophy with moderate to severe spinal stenosis. Moderate foraminal encroachment bilaterally.  L5-S1: Disk bulging and spurring diffusely left greater than right. Bilateral facet hypertrophy left greater than right. Moderate left foraminal encroachment with impingement of the left L5 nerve root. Mild to moderate right foraminal encroachment also noted.  IMPRESSION: Sclerotic lesion in the L2 vertebral body on the right posteriorly corresponding to the CT finding. This appears to be a nonaggressive process there is associated dural calcification at this level. This may be a healed process, perhaps a hemangioma. Followup MRI with contrast in 3-6 months is suggested to assure stability.  Moderately large left  paracentral disc protrusion L2-3 with impingement of the left L3 nerve root and moderate to severe spinal stenosis.  Multifactorial spinal stenosis at L3-4 and L4-5. Left foraminal encroachment L5-S1.   Electronically Signed   By: Marlan Palau M.D.   On: 04/25/2013 08:33   Ir Fluoro Guide Ndl Plmt / Bx  04/27/2013   CLINICAL DATA:  Lumbosacral spondylosis without myelopathy. L2-L3 the disc extrusion with compression of the descending left L3 nerve in the lateral recess. Left-sided low back and hip pain.  EXAM: LUMBAR SELECTIVE NERVE ROOT BLOCK AND TRANSFORAMINAL EPIDURAL STEROID INJECTION  FLUOROSCOPY TIME:  0 min 12 seconds  PROCEDURE: After a thorough discussion of risks and benefits of the procedure, written and oral informed consent was obtained. Verbal consent was obtained by Dr. Carlota Raspberry. General risks of the procedure included allergic reaction (up to and including death), bleeding, infection, injury to nerves, blood vessels, and adjacent structures. Specific risks to this procedure include nontarget injection, puncture of a nerve root sleeve, puncture of diseased disk, worsening of back pain, temporary or permanent paresthesias and/or paralysis. We discussed the moderate likelihood of moderate lasting relief/attainment of therapeutic goal. Time out form was completed prior to commencing procedure.  The patient voiced an understanding and agreed to proceed. Preliminary localization was performed over the L3-L4  level. The skin was marked, and subsequently prepped and draped in the usual sterile fashion using Betadine soap, or chlorhexidine soap in the case of allergy. Under sterile technique, with the skin and deep subcutaneous tissues were anesthetized with 1% lidocaine without epinephrine. Under fluoroscopic guidance, 3-1/2 inch 22-gauge spinal needle was positioned at the superior lateral aspect of the neural foramen, just lateral to the 6 o'clock position of the pedicle. Position was confirmed on AP and  lateral projections and representative images captured. Injection of 1 mL Omnipaque 180 confirmed position. No vascular uptake was observed. Subsequently, 120 mg of Depo-Medrol and 2 mL of 1% lidocaine without epinephrine was injected. The patient tolerated the procedure well and there were no complications. Patient was observed for 30 minutes prior to discharge under the care of a driver.  IMPRESSION: Technically successful 1st left L3 lumbar selective nerve root block and transforaminal epidural steroid injection.   Electronically Signed   By: Andreas Newport M.D.   On: 04/27/2013 15:15    Microbiology: No results found for this or any previous visit (from the past 240 hour(s)).   Labs: Basic Metabolic Panel:  Recent Labs Lab 04/25/13 0714 04/25/13 1529 04/26/13 0555  NA 138 134* 139  K 4.4 4.2 3.9  CL 105 101 105  CO2 25 23 25   GLUCOSE 185* 213* 116*  BUN 16 15 13   CREATININE 1.02 0.98 1.04  CALCIUM 8.7 8.6 8.7   Liver Function Tests:  Recent Labs Lab 04/25/13 0714  AST 12  ALT 14  ALKPHOS 74  BILITOT 0.2*  PROT 7.0  ALBUMIN 3.2*   No results found for this basename: LIPASE, AMYLASE,  in the last 168 hours No results found for this basename: AMMONIA,  in the last 168 hours CBC:  Recent Labs Lab 04/25/13 0714 04/25/13 1529 04/26/13 0555 04/27/13 1300  WBC 12.8* 15.1* 21.7* 13.6*  NEUTROABS 10.7*  --   --   --   HGB 15.5 15.4 14.8 17.2*  HCT 43.0 43.3 41.9 47.2  MCV 94.3 94.1 93.9 92.4  PLT 251 262 249 285   Cardiac Enzymes: No results found for this basename: CKTOTAL, CKMB, CKMBINDEX, TROPONINI,  in the last 168 hours BNP: BNP (last 3 results) No results found for this basename: PROBNP,  in the last 8760 hours CBG: No results found for this basename: GLUCAP,  in the last 168 hours     Signed:  Edsel Petrin  [facility] 04/27/2013, 3:19 PM

## 2013-04-27 NOTE — Discharge Instructions (Signed)
Back Pain, Adult Low back pain is very common. About 1 in 5 people have back pain.The cause of low back pain is rarely dangerous. The pain often gets better over time.About half of people with a sudden onset of back pain feel better in just 2 weeks. About 8 in 10 people feel better by 6 weeks.  CAUSES Some common causes of back pain include:  Strain of the muscles or ligaments supporting the spine.  Wear and tear (degeneration) of the spinal discs.  Arthritis.  Direct injury to the back. DIAGNOSIS Most of the time, the direct cause of low back pain is not known.However, back pain can be treated effectively even when the exact cause of the pain is unknown.Answering your caregiver's questions about your overall health and symptoms is one of the most accurate ways to make sure the cause of your pain is not dangerous. If your caregiver needs more information, he or she may order lab work or imaging tests (X-rays or MRIs).However, even if imaging tests show changes in your back, this usually does not require surgery. HOME CARE INSTRUCTIONS For many people, back pain returns.Since low back pain is rarely dangerous, it is often a condition that people can learn to manageon their own.   Remain active. It is stressful on the back to sit or stand in one place. Do not sit, drive, or stand in one place for more than 30 minutes at a time. Take short walks on level surfaces as soon as pain allows.Try to increase the length of time you walk each day.  Do not stay in bed.Resting more than 1 or 2 days can delay your recovery.  Do not avoid exercise or work.Your body is made to move.It is not dangerous to be active, even though your back may hurt.Your back will likely heal faster if you return to being active before your pain is gone.  Pay attention to your body when you bend and lift. Many people have less discomfortwhen lifting if they bend their knees, keep the load close to their bodies,and  avoid twisting. Often, the most comfortable positions are those that put less stress on your recovering back.  Find a comfortable position to sleep. Use a firm mattress and lie on your side with your knees slightly bent. If you lie on your back, put a pillow under your knees.  Only take over-the-counter or prescription medicines as directed by your caregiver. Over-the-counter medicines to reduce pain and inflammation are often the most helpful.Your caregiver may prescribe muscle relaxant drugs.These medicines help dull your pain so you can more quickly return to your normal activities and healthy exercise.  Put ice on the injured area.  Put ice in a plastic bag.  Place a towel between your skin and the bag.  Leave the ice on for 15-20 minutes, 03-04 times a day for the first 2 to 3 days. After that, ice and heat may be alternated to reduce pain and spasms.  Ask your caregiver about trying back exercises and gentle massage. This may be of some benefit.  Avoid feeling anxious or stressed.Stress increases muscle tension and can worsen back pain.It is important to recognize when you are anxious or stressed and learn ways to manage it.Exercise is a great option. SEEK MEDICAL CARE IF:  You have pain that is not relieved with rest or medicine.  You have pain that does not improve in 1 week.  You have new symptoms.  You are generally not feeling well. SEEK   IMMEDIATE MEDICAL CARE IF:   You have pain that radiates from your back into your legs.  You develop new bowel or bladder control problems.  You have unusual weakness or numbness in your arms or legs.  You develop nausea or vomiting.  You develop abdominal pain.  You feel faint. Document Released: 06/07/2005 Document Revised: 12/07/2011 Document Reviewed: 10/26/2010 [employer] Patient Information 2014 [employer].  

## 2013-05-03 ENCOUNTER — Encounter (HOSPITAL_COMMUNITY): Payer: Self-pay | Admitting: Emergency Medicine

## 2013-05-03 ENCOUNTER — Emergency Department (HOSPITAL_COMMUNITY)
Admission: EM | Admit: 2013-05-03 | Discharge: 2013-05-03 | Disposition: A | Discharge status: Home / Self Care | Payer: Self-pay | Attending: Emergency Medicine | Admitting: Emergency Medicine

## 2013-05-03 DIAGNOSIS — I10 Essential (primary) hypertension: Secondary | ICD-10-CM | POA: Insufficient documentation

## 2013-05-03 DIAGNOSIS — F17200 Nicotine dependence, unspecified, uncomplicated: Secondary | ICD-10-CM | POA: Insufficient documentation

## 2013-05-03 DIAGNOSIS — Z79899 Other long term (current) drug therapy: Secondary | ICD-10-CM | POA: Insufficient documentation

## 2013-05-03 DIAGNOSIS — M545 Low back pain, unspecified: Secondary | ICD-10-CM | POA: Insufficient documentation

## 2013-05-03 DIAGNOSIS — IMO0002 Reserved for concepts with insufficient information to code with codable children: Secondary | ICD-10-CM | POA: Insufficient documentation

## 2013-05-03 DIAGNOSIS — M549 Dorsalgia, unspecified: Secondary | ICD-10-CM

## 2013-05-03 MED ORDER — OXYCODONE-ACETAMINOPHEN 5-325 MG PO TABS
2.0000 | ORAL_TABLET | Freq: Once | ORAL | Status: AC
  Administered 2013-05-03: 2 via ORAL
  Filled 2013-05-03: qty 2

## 2013-05-03 MED ORDER — OXYCODONE-ACETAMINOPHEN 5-325 MG PO TABS: 1.0000 | ORAL_TABLET | ORAL | Status: DC | PRN

## 2013-05-03 NOTE — ED Notes (Signed)
Pt arrives [facility] from home with ongoing low back pain. Symptoms began worsening yesterday morning. Pt discharged from hospital on 04/28/13 after having injections. Endorses pain improved. Yesterday, he noticed cramping in both legs yesterday. Unable to bear weight this morning. Denies loss of bowel or bladder. Denies recent injury.  Pt with known "pinch nerve." EMS gave Fentanyl 100 mcg IVP prior to arrival.

## 2013-05-03 NOTE — ED Notes (Signed)
MD at bedside. 

## 2013-05-03 NOTE — ED Notes (Signed)
Post residual void measured >63mL.  MD Criss Alvine made aware.

## 2013-05-03 NOTE — ED Provider Notes (Signed)
CSN: [id number]     Arrival date & time 05/03/13  4098 History   First MD Initiated Contact with Patient 05/03/13 [id number]     Chief Complaint  Patient presents with  . Back Pain   (Consider location/radiation/quality/duration/timing/severity/associated sxs/prior Treatment) HPI Comments: 57 year old male presents with recurrent back pain. He states it started 2-3 days ago. He was seen here last week and admitted and had a L3 nerve block for pain. He is feeling well and was discharged home. He has not been able to fill his prescriptions for hypertension due to money. The pain has been worsening over the past few days ago so bad is having trouble walking due to the pain. He denies any weakness, numbness, bowel or bladder incontinence. He not have any dysuria. The pain is similar to when he presented last time. He was given 100 mcg of fentanyl by EMS and states pain is gone from a 10 to a 4.   Past Medical History  Diagnosis Date  . Hypertension    Past Surgical History  Procedure Laterality Date  . Appendectomy     No family history on file. History  Substance Use Topics  . Smoking status: Current Every Day Smoker  . Smokeless tobacco: Not on file  . Alcohol Use: Yes    Review of Systems  Constitutional: Negative for fever and chills.  Respiratory: Negative for shortness of breath.   Cardiovascular: Negative for chest pain.  Gastrointestinal: Negative for vomiting.  Genitourinary: Negative for dysuria.  Musculoskeletal: Positive for back pain.  Neurological: Negative for weakness, numbness and headaches.  All other systems reviewed and are negative.    Allergies  Review of patient's allergies indicates no known allergies.  Home Medications   Current Outpatient Rx  Name  Route  Sig  Dispense  Refill  . amLODipine (NORVASC) 5 MG tablet   Oral   Take 1 tablet (5 mg total) by mouth daily.   30 tablet   0   . diazepam (VALIUM) 5 MG tablet   Oral   Take 1 tablet (5 mg  total) by mouth every 12 (twelve) hours as needed (spasm).   15 tablet   0   . lisinopril (PRINIVIL,ZESTRIL) 20 MG tablet   Oral   Take 1 tablet (20 mg total) by mouth daily.   30 tablet   0   . predniSONE (DELTASONE) 20 MG tablet   Oral   Take 3 tablets (60 mg total) by mouth daily.   12 tablet   0    BP 177/97  Pulse 81  Temp(Src) 98.6 F (37 C) (Oral)  Resp 18  Ht 5\' 6"  (1.676 m)  Wt 150 lb (68.04 kg)  BMI 24.22 kg/m2  SpO2 97% Physical Exam  Nursing note and vitals reviewed. Constitutional: He is oriented to person, place, and time. He appears well-developed and well-nourished. No distress.  HENT:  Head: Normocephalic and atraumatic.  Right Ear: External ear normal.  Left Ear: External ear normal.  Nose: Nose normal.  Eyes: Right eye exhibits no discharge. Left eye exhibits no discharge.  Neck: Neck supple.  Cardiovascular: Normal rate, regular rhythm, normal heart sounds and intact distal pulses.   Pulmonary/Chest: Effort normal and breath sounds normal.  Abdominal: Soft. He exhibits no distension. There is no tenderness.  Musculoskeletal: He exhibits no edema.       Lumbar back: He exhibits no tenderness.  Neurological: He is alert and oriented to person, place, and time. He has normal strength  and normal reflexes. No sensory deficit. Gait normal. He displays no Babinski's sign on the right side. He displays no Babinski's sign on the left side.  5/5 strength in both lower extremities. No clonus  Skin: Skin is warm and dry.    ED Course  Procedures (including critical care time) Labs Review Labs Reviewed - No data to display Imaging Review No results found.  EKG Interpretation   None       MDM   1. Back pain   2. Hypertension    Patient with recurrent back pain. Sent home from hosp last week with valium but no pain meds. No weakness, had trouble getting up soley bc of pain. Normal neuro exam here, no B/B incontinence or hard signs to suggest  needing emergent MRI. His pain is substantially improved, will d/c with po pain meds and f/u with his neurosurgeon. He has minimal fluid (~12 cc) on post void so spinal cord etiology seems very unlikely. As he can walk normally with pain controlled here will d/c    Audree Camel, MD 05/03/13 1521

## 2013-05-22 ENCOUNTER — Ambulatory Visit: Payer: Self-pay

## 2013-05-22 ENCOUNTER — Inpatient Hospital Stay: Payer: Self-pay | Admitting: Internal Medicine

## 2013-06-05 ENCOUNTER — Ambulatory Visit: Payer: Self-pay

## 2014-12-11 ENCOUNTER — Encounter (HOSPITAL_COMMUNITY): Payer: Self-pay | Admitting: *Deleted

## 2014-12-11 ENCOUNTER — Emergency Department (HOSPITAL_COMMUNITY)
Admission: EM | Admit: 2014-12-11 | Discharge: 2014-12-11 | Disposition: A | Discharge status: Home / Self Care | Payer: Self-pay | Attending: Emergency Medicine | Admitting: Emergency Medicine

## 2014-12-11 DIAGNOSIS — Z720 Tobacco use: Secondary | ICD-10-CM | POA: Insufficient documentation

## 2014-12-11 DIAGNOSIS — M62830 Muscle spasm of back: Secondary | ICD-10-CM | POA: Insufficient documentation

## 2014-12-11 DIAGNOSIS — I10 Essential (primary) hypertension: Secondary | ICD-10-CM | POA: Insufficient documentation

## 2014-12-11 MED ORDER — HYDROCODONE-ACETAMINOPHEN 5-325 MG PO TABS
1.0000 | ORAL_TABLET | Freq: Once | ORAL | Status: AC
  Administered 2014-12-11: 1 via ORAL
  Filled 2014-12-11: qty 1

## 2014-12-11 MED ORDER — CYCLOBENZAPRINE HCL 10 MG PO TABS
10.0000 mg | ORAL_TABLET | Freq: Once | ORAL | Status: AC
  Administered 2014-12-11: 10 mg via ORAL
  Filled 2014-12-11: qty 1

## 2014-12-11 MED ORDER — KETOROLAC TROMETHAMINE 60 MG/2ML IM SOLN
30.0000 mg | Freq: Once | INTRAMUSCULAR | Status: AC
  Administered 2014-12-11: 30 mg via INTRAMUSCULAR
  Filled 2014-12-11: qty 2

## 2014-12-11 MED ORDER — NAPROXEN 500 MG PO TABS: 500.0000 mg | ORAL_TABLET | Freq: Two times a day (BID) | ORAL | Status: DC

## 2014-12-11 MED ORDER — CYCLOBENZAPRINE HCL 10 MG PO TABS: 10.0000 mg | ORAL_TABLET | Freq: Two times a day (BID) | ORAL | Status: DC | PRN

## 2014-12-11 NOTE — Discharge Instructions (Signed)
Do not take the muscle relaxant if driving because it will make you sleepy.

## 2014-12-11 NOTE — ED Notes (Signed)
Pt in from home c/o L sided lower back pain non radiating onset x 2 days post using the lawnmower, reports pain with weight bearing & ambulation, denies dysuria, pt A&O x4

## 2014-12-11 NOTE — ED Provider Notes (Signed)
CSN: [id number]     Arrival date & time 12/11/14  1812 History  This chart was scribed for non-physician practitioner,Hope M. Damian Leavell, NP, working with Bethann Berkshire, MD, by Budd Palmer ED Scribe. This patient was seen in room TR06C/TR06C and the patient's care was started at 7:09 PM      Chief Complaint  Patient presents with  . Back Pain   The history is provided by the patient. No language interpreter was used.   HPI Comments: Logan Brewer is a 59 y.o. male who presents to the Emergency Department complaining of constant, aching, localized, left-sided lower back pain onset 3 days ago.  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  He states that he took tylenol yesterday, with no effect.  He reports a previous occurrence of lower back pain in 2014, for which he received an injection of dilaudid and was admitted to [facility] for a few days.  He denies associated nausea, fever, abdominal pain, bowel/bladder incontinence. He has no PCP. He denies medical conditions or taking any medications.  He request a note for being out of work today and wants to return tomorrow.  Patient also reports that the pain started after he was using the lawn mower.  Past Medical History  Diagnosis Date  . Hypertension    Past Surgical History  Procedure Laterality Date  . Appendectomy     No family history on file. History  Substance Use Topics  . Smoking status: Current Every Day Smoker -- 0.50 packs/day    Types: Cigarettes  . Smokeless tobacco: Not on file  . Alcohol Use: No    Review of Systems  Constitutional: Negative for fever.  Gastrointestinal: Negative for nausea and abdominal pain.  Musculoskeletal: Positive for myalgias and back pain.  all other systems negative  Allergies  Review of patient's allergies indicates no known allergies.  Home Medications   Prior to Admission medications   Medication Sig Start Date End Date Taking?  Authorizing Provider  cyclobenzaprine (FLEXERIL) 10 MG tablet Take 1 tablet (10 mg total) by mouth 2 (two) times daily as needed for muscle spasms. 12/11/14   Hope Orlene Och, NP  naproxen (NAPROSYN) 500 MG tablet Take 1 tablet (500 mg total) by mouth 2 (two) times daily. 12/11/14   Hope Orlene Och, NP  oxyCODONE-acetaminophen (PERCOCET) 5-325 MG per tablet Take 1-2 tablets by mouth every 4 (four) hours as needed. 05/03/13   Pricilla Loveless, MD   BP 187/72 mmHg  Pulse 63  Temp(Src) 98.1 F (36.7 C) (Oral)  Resp 16  SpO2 100% Physical Exam  Constitutional: He is oriented to person, place, and time. He appears well-developed and well-nourished. No distress.  HENT:  Head: Normocephalic and atraumatic.  Mouth/Throat: Oropharynx is clear and moist.  Eyes: EOM are normal.  Neck: Normal range of motion. Neck supple.  Cardiovascular: Normal rate and regular rhythm.   Radial Pulse, DP/PT pulse +2.  Pulmonary/Chest: Effort normal and breath sounds normal.  Abdominal: Soft. Bowel sounds are normal. There is no tenderness.  Musculoskeletal: Normal range of motion. He exhibits no edema.       Lumbar back: He exhibits tenderness and spasm. He exhibits normal range of motion, no deformity and normal pulse.       Back:  No TTP over spinal column Muscle spasm to left lumbar area  Neurological: He is alert and oriented to person, place, and time. He has normal strength. No  cranial nerve deficit or sensory deficit. Coordination and gait normal.  Reflex Scores:      Bicep reflexes are 2+ on the right side and 2+ on the left side.      Brachioradialis reflexes are 2+ on the right side and 2+ on the left side.      Patellar reflexes are 2+ on the right side and 2+ on the left side.      Achilles reflexes are 2+ on the right side and 2+ on the left side. Skin: Skin is warm and dry.  Psychiatric: He has a normal mood and affect. His behavior is normal.  Nursing note and vitals reviewed.   ED Course   Procedures  DIAGNOSTIC STUDIES: Oxygen Saturation is 100% on RA, normal by my interpretation.    COORDINATION OF CARE: 7:12 PM - Discussed plans to order pain medication and muscle relaxant. Pt advised of plan for treatment and he agrees with plan.  MDM  59 y.o. male with low back pain that started 2 days ago that increased with movement and ambulation. Stable for d/c without focal neuro deficits. Pain improved with flexeril and Toradol. Discussed with the patient and all questioned fully answered. He will return if any problems arise.   Final diagnoses:  Back muscle spasm   I personally performed the services described in this documentation, which was scribed in my presence. The recorded information has been reviewed and is accurate.    [address] [place], [place] 12/11/14 2595  Bethann Berkshire, MD 12/13/14 [phone]

## 2015-03-31 ENCOUNTER — Emergency Department (HOSPITAL_COMMUNITY)
Admission: EM | Admit: 2015-03-31 | Discharge: 2015-03-31 | Disposition: A | Discharge status: Home / Self Care | Payer: Self-pay | Attending: Emergency Medicine | Admitting: Emergency Medicine

## 2015-03-31 ENCOUNTER — Encounter (HOSPITAL_COMMUNITY): Payer: Self-pay | Admitting: Emergency Medicine

## 2015-03-31 ENCOUNTER — Emergency Department (HOSPITAL_COMMUNITY): Payer: Self-pay

## 2015-03-31 DIAGNOSIS — Z791 Long term (current) use of non-steroidal anti-inflammatories (NSAID): Secondary | ICD-10-CM | POA: Insufficient documentation

## 2015-03-31 DIAGNOSIS — Z720 Tobacco use: Secondary | ICD-10-CM | POA: Insufficient documentation

## 2015-03-31 DIAGNOSIS — B360 Pityriasis versicolor: Secondary | ICD-10-CM | POA: Insufficient documentation

## 2015-03-31 DIAGNOSIS — R05 Cough: Secondary | ICD-10-CM | POA: Insufficient documentation

## 2015-03-31 DIAGNOSIS — I10 Essential (primary) hypertension: Secondary | ICD-10-CM | POA: Insufficient documentation

## 2015-03-31 DIAGNOSIS — K4090 Unilateral inguinal hernia, without obstruction or gangrene, not specified as recurrent: Secondary | ICD-10-CM | POA: Insufficient documentation

## 2015-03-31 LAB — BASIC METABOLIC PANEL
Anion gap: 9 (ref 5–15)
BUN: 10 mg/dL (ref 6–20)
CHLORIDE: 103 mmol/L (ref 101–111)
CO2: 21 mmol/L — ABNORMAL LOW (ref 22–32)
Calcium: 8.9 mg/dL (ref 8.9–10.3)
Creatinine, Ser: 1.17 mg/dL (ref 0.61–1.24)
GFR calc non Af Amer: >60 mL/min (ref >60)
Glucose, Bld: 96 mg/dL (ref 65–99)
Potassium: 3.7 mmol/L (ref 3.5–5.1)
Sodium: 133 mmol/L — ABNORMAL LOW (ref 135–145)

## 2015-03-31 LAB — CBC
HCT: 47 % (ref 39.0–52.0)
Hemoglobin: 16.1 g/dL (ref 13.0–17.0)
MCH: 33 pg (ref 26.0–34.0)
MCHC: 34.3 g/dL (ref 30.0–36.0)
MCV: 96.3 fL (ref 78.0–100.0)
PLATELETS: 257 10*3/uL (ref 150–400)
RBC: 4.88 MIL/uL (ref 4.22–5.81)
RDW: 13 % (ref 11.5–15.5)
WBC: 5.6 10*3/uL (ref 4.0–10.5)

## 2015-03-31 LAB — CBG MONITORING, ED: Glucose-Capillary: 162 mg/dL — ABNORMAL HIGH (ref 65–99)

## 2015-03-31 MED ORDER — NAPROXEN 500 MG PO TABS: 500.0000 mg | ORAL_TABLET | Freq: Two times a day (BID) | ORAL | Status: DC

## 2015-03-31 MED ORDER — SELENIUM SULFIDE 2.5 % EX LOTN: 1.0000 "application " | TOPICAL_LOTION | Freq: Every day | CUTANEOUS | Status: DC

## 2015-03-31 NOTE — Discharge Instructions (Signed)
Hernia, Adult A hernia is the bulging of an organ or tissue through a weak spot in the muscles of the abdomen (abdominal wall). Hernias develop most often near the navel or groin. There are many kinds of hernias. Common kinds include:  Femoral hernia. This kind of hernia develops under the groin in the upper thigh area.  Inguinal hernia. This kind of hernia develops in the groin or scrotum.  Umbilical hernia. This kind of hernia develops near the navel.  Hiatal hernia. This kind of hernia causes part of the stomach to be pushed up into the chest.  Incisional hernia. This kind of hernia bulges through a scar from an abdominal surgery. CAUSES This condition may be caused by:  Heavy lifting.  Coughing over a long period of time.  Straining to have a bowel movement.  An incision made during an abdominal surgery.  A birth defect (congenital defect).  Excess weight or obesity.  Smoking.  Poor nutrition.  Cystic fibrosis.  Excess fluid in the abdomen.  Undescended testicles. SYMPTOMS Symptoms of a hernia include:  A lump on the abdomen. This is the first sign of a hernia. The lump may become more obvious with standing, straining, or coughing. It may get bigger over time if it is not treated or if the condition causing it is not treated.  Pain. A hernia is usually painless, but it may become painful over time if treatment is delayed. The pain is usually dull and may get worse with standing or lifting heavy objects. Sometimes a hernia gets tightly squeezed in the weak spot (strangulated) or stuck there (incarcerated) and causes additional symptoms. These symptoms may include:  Vomiting.  Nausea.  Constipation.  Irritability. DIAGNOSIS A hernia may be diagnosed with:  A physical exam. During the exam your health care provider may ask you to cough or to make a specific movement, because a hernia is usually more visible when you move.  Imaging tests. These can  include:  X-rays.  Ultrasound.  CT scan. TREATMENT A hernia that is small and painless may not need to be treated. A hernia that is large or painful may be treated with surgery. Inguinal hernias may be treated with surgery to prevent incarceration or strangulation. Strangulated hernias are always treated with surgery, because lack of blood to the trapped organ or tissue can cause it to die. Surgery to treat a hernia involves pushing the bulge back into place and repairing the weak part of the abdomen. HOME CARE INSTRUCTIONS  Avoid straining.  Do not lift anything heavier than 10 lb (4.5 kg).  Lift with your leg muscles, not your back muscles. This helps avoid strain.  When coughing, try to cough gently.  Prevent constipation. Constipation leads to straining with bowel movements, which can make a hernia worse or cause a hernia repair to break down. You can prevent constipation by:  Eating a high-fiber diet that includes plenty of fruits and vegetables.  Drinking enough fluids to keep your urine clear or pale yellow. Aim to drink 6-8 glasses of water per day.  Using a stool softener as directed by your health care provider.  Lose weight, if you are overweight.  Do not use any tobacco products, including cigarettes, chewing tobacco, or electronic cigarettes. If you need help quitting, ask your health care provider.  Keep all follow-up visits as directed by your health care provider. This is important. Your health care provider may need to monitor your condition. SEEK MEDICAL CARE IF:  You have  swelling, redness, and pain in the affected area.  Your bowel habits change. SEEK IMMEDIATE MEDICAL CARE IF:  You have a fever.  You have abdominal pain that is getting worse.  You feel nauseous or you vomit.  You cannot push the hernia back in place by gently pressing on it while you are lying down.  The hernia:  Changes in shape or size.  Is stuck outside the  abdomen.  Becomes discolored.  Feels hard or tender.   This information is not intended to replace advice given to you by your health care provider. Make sure you discuss any questions you have with your health care provider.   Document Released: 06/07/2005 Document Revised: 06/28/2014 Document Reviewed: 04/17/2014 Elsevier Interactive Patient Education 2016 [employer]  Tinea Versicolor Tinea versicolor is a skin infection that is caused by a type of yeast. It causes a rash that shows up as light or dark patches on the skin. It often occurs on the chest, back, neck, or upper arms. The condition usually does not cause other problems. In most cases, it goes away in a few weeks with treatment. The infection cannot be spread by person to another person. HOME CARE  Take medicines only as told by your doctor.  Scrub your skin every day with a dandruff shampoo as told by your doctor.  Do not scratch your skin in the rash area.  Avoid places that are hot and humid.  Do not use tanning booths.  Try to avoid sweating a lot. GET HELP IF:  Your symptoms get worse.  You have a fever.  You have redness, swelling, or pain in the area of your rash.  You have fluid, blood, or pus coming from your rash.  Your rash comes back after treatment.   This information is not intended to replace advice given to you by your health care provider. Make sure you discuss any questions you have with your health care provider.   Document Released: 05/20/2008 Document Revised: 06/28/2014 Document Reviewed: 03/19/2014 Elsevier Interactive Patient Education [employer]

## 2015-03-31 NOTE — ED Notes (Signed)
Pt. Left with all belongings and refused wheelchair. Discharge instructions were reviewed and all questions were answered.  

## 2015-03-31 NOTE — ED Notes (Signed)
Pt from home with c/o lightheadedness during position changes for the last 3 weeks.  Pt states he has fallen due to the dizziness but remembers the even entirely.  Pt additionally reports a "bump" in his left groin while working.  He can push the "bump" and it goes away, burning with coughing.  Denies knowledge of hernia.  Pt in NAD, A&O.

## 2015-03-31 NOTE — ED Provider Notes (Signed)
CSN: [id number]     Arrival date & time 03/31/15  1727 History   First MD Initiated Contact with Patient 03/31/15 2236     Chief Complaint  Patient presents with  . Dizziness  . Groin Swelling  . Cough    HPI Patient presents to the emergency room for evaluation of a lump in his left groin. Patient has noticed that for the last several weeks. Whenever he is at work and standing he noticed the bump. It gets better when he is lying down. He is a burning discomfort with it. He denies any trouble with nausea vomiting or diarrhea.  He also has had some intermittent episodes of lightheadedness for the last 3 weeks. He denies any chest pain or shortness of breath. No palpitations. He has not had any episodes of loss of consciousness.  Patient also complains of a rash on his back. He is not sure how long that has been there before. It itches somewhat. Denies any recent medication changes or exposure to anything Past Medical History  Diagnosis Date  . Hypertension    Past Surgical History  Procedure Laterality Date  . Appendectomy     History reviewed. No pertinent family history. Social History  Substance Use Topics  . Smoking status: Current Every Day Smoker -- 0.50 packs/day    Types: Cigarettes  . Smokeless tobacco: None  . Alcohol Use: No    Review of Systems  All other systems reviewed and are negative.     Allergies  Review of patient's allergies indicates no known allergies.  Home Medications   Prior to Admission medications   Medication Sig Start Date End Date Taking? Authorizing Provider  cyclobenzaprine (FLEXERIL) 10 MG tablet Take 1 tablet (10 mg total) by mouth 2 (two) times daily as needed for muscle spasms. 12/11/14   Hope Orlene Och, NP  naproxen (NAPROSYN) 500 MG tablet Take 1 tablet (500 mg total) by mouth 2 (two) times daily. 12/11/14   Hope Orlene Och, NP  oxyCODONE-acetaminophen (PERCOCET) 5-325 MG per tablet Take 1-2 tablets by mouth every 4 (four) hours as  needed. 05/03/13   Pricilla Loveless, MD   BP 154/91 mmHg  Pulse 76  Temp(Src) 99.3 F (37.4 C) (Oral)  Resp 18  Wt 161 lb 1.6 oz (73.074 kg)  SpO2 100% Physical Exam  Constitutional: He appears well-developed and well-nourished. No distress.  HENT:  Head: Normocephalic and atraumatic.  Right Ear: External ear normal.  Left Ear: External ear normal.  Eyes: Conjunctivae are normal. Right eye exhibits no discharge. Left eye exhibits no discharge. No scleral icterus.  Neck: Neck supple. No tracheal deviation present.  Cardiovascular: Normal rate, regular rhythm and intact distal pulses.   Pulmonary/Chest: Effort normal and breath sounds normal. No stridor. No respiratory distress. He has no wheezes. He has no rales.  Abdominal: Soft. Bowel sounds are normal. He exhibits no distension. There is no tenderness. There is no rebound and no guarding. A hernia is present. Hernia confirmed positive in the left inguinal area.  Left inguinal hernia, soft, only noticed with Valsalva or when standing  Musculoskeletal: He exhibits no edema or tenderness.  Neurological: He is alert. He has normal strength. No cranial nerve deficit (no facial droop, extraocular movements intact, no slurred speech) or sensory deficit. He exhibits normal muscle tone. He displays no seizure activity. Coordination normal.  Skin: Skin is warm and dry. Rash noted.  Scaling rash on the back, circular lesions  Psychiatric: He has a normal mood  and affect.  Nursing note and vitals reviewed.   ED Course  Procedures (including critical care time) Labs Review Labs Reviewed  BASIC METABOLIC PANEL - Abnormal; Notable for the following:    Sodium 133 (*)    CO2 21 (*)    All other components within normal limits  CBG MONITORING, ED - Abnormal; Notable for the following:    Glucose-Capillary 162 (*)    All other components within normal limits  CBC    Imaging Review Dg Chest 2 View  03/31/2015   CLINICAL DATA:  Cough  EXAM:  CHEST  2 VIEW  COMPARISON:  None.  FINDINGS: Normal heart size. Normal mediastinal contour. No pneumothorax. No pleural effusion. Clear lungs, with no focal lung consolidation and no pulmonary edema.  IMPRESSION: No active disease in the chest.   Electronically Signed   By: Delbert Phenix M.D.   On: 03/31/2015 20:07   I have personally reviewed and evaluated these images and lab results as part of my medical decision-making.   EKG Interpretation   Date/Time:  Monday March 31 2015 18:35:18 EDT Ventricular Rate:  94 PR Interval:  134 QRS Duration: 96 QT Interval:  354 QTC Calculation: 442 R Axis:   78 Text Interpretation:  Normal sinus rhythm Right atrial enlargement Left  ventricular hypertrophy with repolarization abnormality Abnormal ECG Sinus  rhythm Left ventricular hypertrophy T wave abnormality Abnormal ekg  Confirmed by Gerhard Munch  MD [phone]) on 03/31/2015 6:41:23 PM      MDM   Final diagnoses:  Essential hypertension  Unilateral inguinal hernia without obstruction or gangrene, recurrence not specified  Tinea versicolor    Patient is hypertensive. He has been diagnosed with this in the past. Patient does not have a primary care doctor. I recommended follow-up with the [facility] wellness.  Patient does have a left inguinal hernia on exam. I'll give him a prescription for Naprosyn and a referral to [facility] [place] [facility].  Rash on his back is consistent with tinea versicolor. Recommend Selsun lotion.    Linwood Dibbles, MD 03/31/15 [phone]

## 2015-11-30 ENCOUNTER — Encounter (HOSPITAL_COMMUNITY): Payer: Self-pay | Admitting: Emergency Medicine

## 2015-11-30 DIAGNOSIS — Z79899 Other long term (current) drug therapy: Secondary | ICD-10-CM | POA: Insufficient documentation

## 2015-11-30 DIAGNOSIS — M25511 Pain in right shoulder: Secondary | ICD-10-CM | POA: Insufficient documentation

## 2015-11-30 DIAGNOSIS — F17210 Nicotine dependence, cigarettes, uncomplicated: Secondary | ICD-10-CM | POA: Insufficient documentation

## 2015-11-30 DIAGNOSIS — M546 Pain in thoracic spine: Secondary | ICD-10-CM | POA: Insufficient documentation

## 2015-11-30 DIAGNOSIS — I10 Essential (primary) hypertension: Secondary | ICD-10-CM | POA: Insufficient documentation

## 2015-11-30 DIAGNOSIS — M79621 Pain in right upper arm: Secondary | ICD-10-CM | POA: Insufficient documentation

## 2015-11-30 NOTE — ED Notes (Signed)
Pt. reports chronic/intermittent  right upper back pain and right elbow pain for > 1 month , denies injury or fall . Respirations unlabored .

## 2015-12-01 ENCOUNTER — Emergency Department (HOSPITAL_COMMUNITY)
Admission: EM | Admit: 2015-12-01 | Discharge: 2015-12-01 | Disposition: A | Discharge status: Home / Self Care | Payer: Self-pay | Attending: Emergency Medicine | Admitting: Emergency Medicine

## 2015-12-01 ENCOUNTER — Emergency Department (HOSPITAL_COMMUNITY): Payer: Self-pay

## 2015-12-01 DIAGNOSIS — M546 Pain in thoracic spine: Secondary | ICD-10-CM

## 2015-12-01 DIAGNOSIS — M79601 Pain in right arm: Secondary | ICD-10-CM

## 2015-12-01 MED ORDER — HYDROCODONE-ACETAMINOPHEN 5-325 MG PO TABS: ORAL_TABLET | ORAL | Status: DC

## 2015-12-01 MED ORDER — HYDROMORPHONE HCL 1 MG/ML IJ SOLN
1.0000 mg | Freq: Once | INTRAMUSCULAR | Status: AC
Start: 2015-12-01 — End: 2015-12-01
  Administered 2015-12-01: 1 mg via INTRAVENOUS
  Filled 2015-12-01: qty 1

## 2015-12-01 MED ORDER — METHOCARBAMOL 500 MG PO TABS: 1000.0000 mg | ORAL_TABLET | Freq: Three times a day (TID) | ORAL | Status: DC | PRN

## 2015-12-01 MED ORDER — NAPROXEN 500 MG PO TABS: 500.0000 mg | ORAL_TABLET | Freq: Two times a day (BID) | ORAL | Status: DC

## 2015-12-01 MED ORDER — HYDROMORPHONE HCL 1 MG/ML IJ SOLN
0.5000 mg | Freq: Once | INTRAMUSCULAR | Status: AC
  Administered 2015-12-01: 0.5 mg via INTRAVENOUS
  Filled 2015-12-01: qty 1

## 2015-12-01 MED ORDER — ONDANSETRON HCL 4 MG/2ML IJ SOLN
4.0000 mg | Freq: Once | INTRAMUSCULAR | Status: AC
  Administered 2015-12-01: 4 mg via INTRAVENOUS
  Filled 2015-12-01: qty 2

## 2015-12-01 MED ORDER — KETOROLAC TROMETHAMINE 30 MG/ML IJ SOLN
30.0000 mg | Freq: Once | INTRAMUSCULAR | Status: AC
  Administered 2015-12-01: 30 mg via INTRAVENOUS
  Filled 2015-12-01: qty 1

## 2015-12-01 NOTE — ED Notes (Signed)
Pt states that his girlfriend is picking him up.

## 2015-12-01 NOTE — ED Provider Notes (Signed)
CSN: [id number]     Arrival date & time 11/30/15  2325 History   First MD Initiated Contact with Patient 12/01/15 0147     Chief Complaint  Patient presents with  . 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 Pain was worse tonight. Patient was unable to sleep prompting emergency department visit. Patient states he has no lower back pain. He is ambulatory without difficulty. No injury at onset of this pain. Patient has massaged the area and applied rubbing alcohol topically without relief. Patient denies warning symptoms of back pain including: fecal incontinence, urinary retention or overflow incontinence, night sweats, waking from sleep with back pain, unexplained fevers or weight loss, h/o cancer, IVDU, recent trauma.    The history is provided by the patient.    Past Medical History  Diagnosis Date  . Hypertension    Past Surgical History  Procedure Laterality Date  . Appendectomy     No family history on file. Social History  Substance Use Topics  . Smoking status: Current Every Day Smoker -- 0.00 packs/day    Types: Cigarettes  . Smokeless tobacco: None  . Alcohol Use: No    Review of Systems  Constitutional: Negative for fever and unexpected weight change.  HENT: Negative for rhinorrhea and sore throat.   Eyes: Negative for redness.  Respiratory: Negative for cough.   Cardiovascular: Positive for chest pain (r lateral chest wall).  Gastrointestinal: Negative for nausea, vomiting, abdominal pain, diarrhea and constipation.       Neg for fecal incontinence  Genitourinary: Negative for dysuria, hematuria, flank pain and difficulty urinating.   Negative for urinary incontinence or retention  Musculoskeletal: Positive for back pain and arthralgias. Negative for myalgias.  Skin: Negative for rash.  Neurological: Negative for weakness, numbness and headaches.       Negative for saddle paresthesias     Allergies  Review of patient's allergies indicates no known allergies.  Home Medications   Prior to Admission medications   Medication Sig Start Date End Date Taking? Authorizing Provider  cyclobenzaprine (FLEXERIL) 10 MG tablet Take 1 tablet (10 mg total) by mouth 2 (two) times daily as needed for muscle spasms. 12/11/14   Hope Orlene Och, NP  naproxen (NAPROSYN) 500 MG tablet Take 1 tablet (500 mg total) by mouth 2 (two) times daily. 03/31/15   Linwood Dibbles, MD  oxyCODONE-acetaminophen (PERCOCET) 5-325 MG per tablet Take 1-2 tablets by mouth every 4 (four) hours as needed. 05/03/13   Pricilla Loveless, MD  selenium sulfide (SELSUN) 2.5 % shampoo Apply 1 application topically daily. Apply to the skin rash noticed on your back for one week 03/31/15   Linwood Dibbles, MD   BP 169/75 mmHg  Pulse 70  Temp(Src) 98.2 F (36.8 C) (Oral)  Resp 18  Ht 5\' 7"  (1.702 m)  Wt 72.122 kg  BMI 24.90 kg/m2  SpO2 100%   Physical Exam  Constitutional: He appears well-developed and well-nourished.  HENT:  Head: Normocephalic and atraumatic.  Eyes: Conjunctivae are normal. Right eye exhibits no discharge. Left eye exhibits no discharge.  Neck: Normal range of motion. Neck supple.  Cardiovascular: Normal rate, regular rhythm and normal heart sounds.   Pulses:      Radial pulses  are 2+ on the right side, and 2+ on the left side.  Pulmonary/Chest: Effort normal and breath sounds normal. He exhibits tenderness.  Abdominal: Soft. There is no tenderness.  Musculoskeletal:       Right shoulder: He exhibits tenderness (posterior). He exhibits normal range of motion.       Left shoulder: He exhibits normal range of motion.       Right elbow: He exhibits normal  range of motion, no swelling and no effusion. Tenderness (generalized) found.       Right wrist: Normal.       Cervical back: He exhibits normal range of motion, no tenderness and no bony tenderness.       Thoracic back: He exhibits tenderness. He exhibits normal range of motion.       Lumbar back: Normal. He exhibits normal range of motion and no tenderness.       Back:       Right upper arm: He exhibits tenderness. He exhibits no bony tenderness and no swelling.       Right forearm: Normal.       Right hand: Normal.  Neurological: He is alert.  Skin: Skin is warm and dry.  Psychiatric: He has a normal mood and affect.  Nursing note and vitals reviewed.   ED Course  Procedures (including critical care time) Imaging Review Dg Chest 2 View  12/01/2015  CLINICAL DATA:  Chronic intermittent right upper back pain. Initial encounter. EXAM: CHEST  2 VIEW COMPARISON:  Chest radiograph from 03/31/2015 FINDINGS: The lungs are well-aerated. Mild vascular congestion is noted. Mild peribronchial thickening is seen. There is no evidence of focal opacification, pleural effusion or pneumothorax. The heart is normal in size; the mediastinal contour is within normal limits. No acute osseous abnormalities are seen. IMPRESSION: Mild vascular congestion noted. Mild peribronchial thickening seen. No displaced rib fractures identified. Electronically Signed   By: Roanna RaiderJeffery  Chang M.D.   On: 12/01/2015 03:11   Dg Shoulder Right  12/01/2015  CLINICAL DATA:  Chronic intermittent right upper back pain. Initial encounter. EXAM: RIGHT SHOULDER - 2+ VIEW COMPARISON:  None. FINDINGS: There is no evidence of fracture or dislocation. The right humeral head is seated within the glenoid fossa. The acromioclavicular joint is unremarkable in appearance. No significant soft tissue abnormalities are seen. The visualized portions of the right lung are clear. IMPRESSION: No evidence of fracture or dislocation. Electronically Signed    By: Roanna RaiderJeffery  Chang M.D.   On: 12/01/2015 03:11   I have personally reviewed and evaluated these images and lab results as part of my medical decision-making.   2:03 AM Patient seen and examined. Work-up initiated. Medications ordered. Patient drove to the ED. States that he is able to call family to drive him home.   Vital signs reviewed and are as follows: BP 169/75 mmHg  Pulse 70  Temp(Src) 98.2 F (36.8 C) (Oral)  Resp 18  Ht 5\' 7"  (1.702 m)  Wt 72.122 kg  BMI 24.90 kg/m2  SpO2 100%  4:24 AM Informed patient of imaging results. He states that his pain is not very much improved. Additional medication ordered. Plan: discharge to home with symptom control, orthopedic follow-up, PCP referrals when pain is adequately improved.  4:52 AM pain is reasonably controlled. Patient will be discharged home with Vicodin, Robaxin, naproxen. PCP referrals given. Orthopedic referral given. Patient updated, agrees with plan. Will ensure that he has a safe at home prior to discharge.  No red flag s/s  of low back pain. Patient was counseled on back pain precautions and told to do activity as tolerated but do not lift, push, or pull heavy objects more than 10 pounds for the next week.  Patient counseled to use ice or heat on back for no longer than 15 minutes every hour.   Patient prescribed muscle relaxer and counseled on proper use of muscle relaxant medication.    Patient prescribed narcotic pain medicine and counseled on proper use of narcotic pain medications. Counseled not to combine this medication with others containing tylenol.   Urged patient not to drink alcohol, drive, or perform any other activities that requires focus while taking either of these medications.  Patient urged to follow-up with PCP if pain does not improve with treatment and rest or if pain becomes recurrent. Urged to return with worsening severe pain, loss of bowel or bladder control, trouble walking.   The patient  verbalizes understanding and agrees with the plan.  MDM   Final diagnoses:  Right arm pain  Right-sided thoracic back pain   Patient with right arm and upper back pain for the past one month. Pain is reproduced with movement and palpation. Patient works [employer]. History of disc disease lower back. Symptoms do have some radicular features. Question cervical radiculopathy or impingement in shoulder. No lower extremity symptoms or signs of central cord compression. X-ray of shoulder and chest performed which were negative for any acute abnormalities. Patient's symptoms controlled in emergency department with pain medication and NSAIDs. Patient does not have a primary care physician. Appropriate referrals given. Return instructions as above.   Renne Crigler, PA-C 12/01/15 1610  Shon Baton, MD 12/01/15 [phone]

## 2015-12-01 NOTE — Discharge Instructions (Signed)
Please read and follow all provided instructions.  Your diagnoses today include:  1. Right arm pain   2. Right-sided thoracic back pain    Tests performed today include:  An x-ray of the affected area - does NOT show any broken bones  Vital signs. See below for your results today.   Medications prescribed:   Vicodin (hydrocodone/acetaminophen) - narcotic pain medication  DO NOT drive or perform any activities that require you to be awake and alert because this medicine can make you drowsy. BE VERY CAREFUL not to take multiple medicines containing Tylenol (also called acetaminophen). Doing so can lead to an overdose which can damage your liver and cause liver failure and possibly death.   Robaxin (methocarbamol) - muscle relaxer medication  DO NOT drive or perform any activities that require you to be awake and alert because this medicine can make you drowsy.    Naproxen - anti-inflammatory pain medication  Do not exceed  naproxen every 12 hours, take with food  You have been prescribed an anti-inflammatory medication or NSAID. Take with food. Take smallest effective dose for the shortest duration needed for your pain. Stop taking if you experience stomach pain or vomiting.   Take any prescribed medications only as directed.  Home care instructions:   Follow any educational materials contained in this packet  Follow R.I.C.E. Protocol:  R - rest your injury   I  - use ice on injury without applying directly to skin  C - compress injury with bandage or splint  E - elevate the injury as much as possible  Follow-up instructions: Please follow-up with your primary care provider or the provided orthopedic physician (bone specialist) if you continue to have significant pain in 1 week. In this case you may have a more severe injury that requires further care.   Return instructions:   Please return if your fingers are numb or tingling, appear gray or blue, or you have  severe pain (also elevate the arm and loosen splint or wrap if you were given one)  Please return to the Emergency Department if you experience worsening symptoms.   Please return if you have any other emergent concerns.  Additional Information:  Your vital signs today were: BP 162/71 mmHg   Pulse 65   Temp(Src) 98.2 F (36.8 C) (Oral)   Resp 18   Ht  (1.702 m)   Wt 72.122 kg   BMI 24.90 kg/m2   SpO2 99% If your blood pressure (BP) was elevated above 135/85 this visit, please have this repeated by your doctor within one month. -------------- [employer] is a great source of information about community services available.  Access by dialing 2-1-1 from anywhere in [place], or by website -  PooledIncome.pl.   Other Local Resources (Updated 06/2015)  Financial Assistance   Services    Phone Number and Address  [facility]  Low-cost medical care - 1st and 3rd Saturday of every month  Must not qualify for public or private insurance and must have limited income [phone] [address] [place], [place]    [place] [employer] of Social Services  Child care  Emergency assistance for housing and [employer]  Medicaid [phone] [address] [place], [place] [address]   [facility] Department  Low-cost medical care for children, communicable diseases, sexually-transmitted diseases, immunizations, maternity care, womens health and family planning [phone] [address] [place],  [place] [address]  [facility]   Medication assistance for [facility] residents  Must meet income requirements [phone] [address] [place], [place].    [facility] Social Services  Child care  Emergency assistance for housing and [employer]  Medicaid [phone] [address] [place], [place] [address]  [facility]   Low-cost medical care,   Monday through Friday, 9 am to 6 pm.   Accepts Medicare/Medicaid, and self-pay [phone] [address] [place], [place] [address]  [facility]  Low-cost medical care - Monday through Friday, 8:30 am - 5:30 pm  Accepts Medicaid and self-pay [phone] [address] [place], [place] [address]   [facility]  Primary medical care, including for those with sickle cell disease  Accepts Medicare, Medicaid, insurance and self-pay [phone] [address] [place], [place]  [facility]   Primary medical care  Accepts Medicare, [place], insurance and self-pay [phone] [address] [place], [place] [address]   [facility] Department of Social Services  Child care  Emergency assistance for housing and [employer]  Medicaid [phone] [place], [place] [address]  [facility] Department of Health and [employer]  Child care  Emergency assistance for housing and [employer]  Medicaid [phone] [address] [place], [place] [address]   [facility] Medication Assistance Program  Medication assistance for [facility] residents with no insurance only  Must have a primary care doctor [phone] [address] [place], [place]  [facility]   Primary medical care  [place], [place], insurance  [phone] [address] [place], [place]  [facility]   Medication assistance [phone]  [name]   Primary medical care  Accepts Medicare, [place], insurance and self-pay [phone] [address] [place], [place] [address]  [name]   Primary medical care  Accepts Medicare, [place], insurance and self-pay  [phone] [address] [place], [place] [address]  [facility]  For [place] residents between the ages of 34 and 8 who do not have any form of health insurance, Medicare, [place], or [place] benefits.  Services are provided free of charge to uninsured patients who fall within federal poverty guidelines.    Hours: Tuesdays and Thursdays, 4:15 - 8 pm [phone] [address] [place], [place] [address]  [facility]     Primary medical care  Dental care  Nutritional counseling  Pharmacy  Accepts Medicaid, Medicare, most insurance.  Fees are adjusted based on ability to pay.   [phone] [facility] [address] [place], [place]  [phone] [facility] [address] [place], [place]  [phone] [facility] [place], [place]  [phone] [facility], [address] [place], [place]  [phone] [facility] [address] [place], [place]  [facility]  Womens health and family planning [phone] [address] [place], [place]  [facility] Department of Social Services  Child care  Emergency assistance for housing and [employer]  Medicaid [phone] [address] [place], [place] [address]   [facility]    Ages 35 and older  Hours: Mondays and Thursdays, 7:00 am - 9:00 am Patients are seen on a first come, first  served basis. [phone], ext. [phone] [place], [place]  [facility] of Social Services  Child care  Emergency assistance for housing and [employer]ilities  Food stamps  Medicaid [phone] [address] [place], [place] [phone]  [facility]  Medication assistance  Rental assistance  Food pantry  Medication assistance  Housing assistance  Emergency food distribution  Utility assistance  [phone] [address] [place], [place]  [phone]  [address] [place], [place] [id number] Hours: Tuesdays and Thursdays from 9am - 12 noon by appointment only  [phone] [address] [place], [place] [id number]  [facility] Lanae Boast[facility]ara [facility]. [facility]   Accepts private insurance, [place], and [place].  Payment is based on a sliding scale for those without insurance.  Hours: Mondays, Tuesdays and Thursdays, 8:30 am - 5:30 pm.   [phone] [address] [place], [place]  [facility] - Family Medicine at [facility]    Accepts private insurance, [place], and [place].  Payment is based on a sliding scale for those without insurance. [phone] [address] [place], [place]  [facility] - Pediatrics at [address]. Scientist, research (physical sciences)[place]  Accepts private insurance, [employer]icare, and [place].  Payment is based on a sliding scale for those without insurance [phone] [address], [place], [place]  [facility] - [facility]s at [place]  Accepts private insurance, [place], and [place].  Payment is based on a sliding scale for those without insurance. [phone] [address] [place], [place]  [facility] - [facility] at [facility]  Accepts private insurance, [place], and [place].  Payment is based on a sliding scale for those without insurance. [phone], [phone] [place], [place].    [address] [facility]  Maternity care.  Accepts Medicaid and self-pay. [phone] [address] [place], [place]

## 2017-02-12 ENCOUNTER — Emergency Department (HOSPITAL_COMMUNITY)
Admission: EM | Admit: 2017-02-12 | Discharge: 2017-02-12 | Disposition: A | Discharge status: Home / Self Care | Payer: Self-pay | Attending: Emergency Medicine | Admitting: Emergency Medicine

## 2017-02-12 ENCOUNTER — Encounter (HOSPITAL_COMMUNITY): Payer: Self-pay | Admitting: *Deleted

## 2017-02-12 DIAGNOSIS — H11422 Conjunctival edema, left eye: Secondary | ICD-10-CM | POA: Insufficient documentation

## 2017-02-12 DIAGNOSIS — I10 Essential (primary) hypertension: Secondary | ICD-10-CM | POA: Insufficient documentation

## 2017-02-12 DIAGNOSIS — Z79899 Other long term (current) drug therapy: Secondary | ICD-10-CM | POA: Insufficient documentation

## 2017-02-12 DIAGNOSIS — F17210 Nicotine dependence, cigarettes, uncomplicated: Secondary | ICD-10-CM | POA: Insufficient documentation

## 2017-02-12 MED ORDER — FLUOROMETHOLONE 0.25 % OP SUSP: 1.0000 [drp] | Freq: Four times a day (QID) | OPHTHALMIC | 0 refills | Status: DC

## 2017-02-12 MED ORDER — LISINOPRIL 20 MG PO TABS: 20.0000 mg | ORAL_TABLET | Freq: Every day | ORAL | 0 refills | Status: DC

## 2017-02-12 NOTE — ED Triage Notes (Addendum)
Pt is here with report of pain in left eye.  Pt has swollen area on eye (?inflamed lacrimal caruncle).  Pt states that he works [employer] and is unsure if he injured the eye.  He states that this began about a month ago but has become worse this weekend. Pt states that his vision has not been affected other than intermittent mild blurred vision. Pt is hypertensive in triage, he states that he does not take his BP meds and has been out for a long time and does not recall what he takes

## 2017-02-12 NOTE — ED Provider Notes (Signed)
Patient seen and evaluated with PA. Patient has complained of left eye pain. 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 Pupil and cornea are normal and reactive. He is in minimal conjunctival injection surrounding this area. Lacrimal apparatus and lacrimal crank gland do not appear inflamed or swollen or prevalent. Recommend low-dose FML, and ophthalmology referral   Rolland Porter, MD 02/12/17 2308

## 2017-02-12 NOTE — ED Notes (Signed)
PT states understanding of care given, follow up care, and medication prescribed. PT ambulated from ED to car with a steady gait. 

## 2017-02-12 NOTE — Discharge Instructions (Signed)
Use eyedrops 4 times a day. Take lisinopril once daily. Schedule an appointment with eye doctor for evaluation and treatment of your eye. Follow-up with [facility] and wellness for further management of your blood pressure.  Return to the emergency room if you have worsening pain, fever, chills, complete vision loss, or any new or worsening symptoms.

## 2017-02-12 NOTE — ED Provider Notes (Signed)
[facility]EMERGENCY DEPT Provider Note   CSN: [id number] Arrival date & time: 02/12/17  2132     History   Chief Complaint Chief Complaint  Patient presents with  . Eye Problem    HPI Logan Brewer is a 61 y.o. male presenting with one-month history of eye irritation.  Patient states that about a month ago, he started to notice he felt like something was in his eye. This irritation has steadily increased until became more painful this past week. In this past week, he reports intermittent vision blurriness that he can rub away. He reports clear watery tears, but no purulent or thick discharge. He denies blackness of his vision, double vision, or photophobia. He denies pain within or behind the eye. He has no pain with movement of his eye. He denies fever, chills, pain or swelling of the right eye, ear pain, or sore throat. He denies trauma, falls, or injury. He denies any foreign body getting into his eye. He has not taken anything for pain. He has no history of eye problems.  HPI  Past Medical History:  Diagnosis Date  . Hypertension     Patient Active Problem List   Diagnosis Date Noted  . HTN (hypertension) 04/26/2013  . Leukocytosis, unspecified 04/26/2013  . Radiculopathy, lumbosacral region 04/25/2013  . Back pain 04/25/2013  . Anxiety 04/25/2013    Past Surgical History:  Procedure Laterality Date  . APPENDECTOMY         Home Medications    Prior to Admission medications   Medication Sig Start Date End Date Taking? Authorizing Provider  fluorometholone (FML FORTE) 0.25 % ophthalmic suspension Place 1 drop into the left eye 4 (four) times daily. 02/12/17   Karolee Meloni, PA-C  HYDROcodone-acetaminophen (NORCO/VICODIN) 5-325 MG tablet Take 1-2 tablets every 6 hours as needed for severe pain 12/01/15   Renne Crigler, PA-C  lisinopril (PRINIVIL,ZESTRIL) 20 MG tablet Take 1 tablet (20 mg total) by mouth daily. 02/12/17   Daveena Elmore, PA-C  methocarbamol  (ROBAXIN) 500 MG tablet Take 2 tablets (1,000 mg total) by mouth every 8 (eight) hours as needed for muscle spasms. 12/01/15   Renne Crigler, PA-C  naproxen (NAPROSYN) 500 MG tablet Take 1 tablet (500 mg total) by mouth 2 (two) times daily. 12/01/15   Renne Crigler, PA-C    Family History No family history on file.  Social History Social History  Substance Use Topics  . Smoking status: Current Every Day Smoker    Packs/day: 0.00    Types: Cigarettes  . Smokeless tobacco: Not on file  . Alcohol use No     Allergies   Patient has no known allergies.   Review of Systems Review of Systems  Constitutional: Negative for chills and fever.  HENT: Negative for congestion, ear pain, facial swelling, rhinorrhea, sinus pain, sinus pressure and sore throat.   Eyes: Positive for pain. Negative for photophobia, redness and itching.       Intermittent blurry vision  Neurological: Negative for headaches.     Physical Exam Updated Vital Signs BP (!) 199/92 (BP Location: Right Arm)   Pulse 73   Temp 98.2 F (36.8 C) (Oral)   Resp 18   Wt 72.6 kg (160 lb)   SpO2 98%   BMI 25.06 kg/m   Physical Exam  Constitutional: He is oriented to person, place, and time. He appears well-developed and well-nourished. No distress.  HENT:  Head: Normocephalic and atraumatic.  Eyes: Pupils are equal, round, and reactive to light.  Conjunctivae, EOM and lids are normal. Right eye exhibits no discharge. Left eye exhibits chemosis. Left eye exhibits no discharge.  Chemosis of the sclera of the medial left eye. No obvious drainage. EOMI without pain. No injection. No surrounding swelling of the left eye. No obvious injury, swelling, or injection of the right eye.  Neck: Normal range of motion.  Pulmonary/Chest: Effort normal.  Abdominal: He exhibits no distension.  Musculoskeletal: Normal range of motion.  Neurological: He is alert and oriented to person, place, and time.  Skin: Skin is warm. No rash  noted.  Psychiatric: He has a normal mood and affect.  Nursing note and vitals reviewed.    ED Treatments / Results  Labs (all labs ordered are listed, but only abnormal results are displayed) Labs Reviewed - No data to display  EKG  EKG Interpretation None       Radiology No results found.  Procedures Procedures (including critical care time)  Medications Ordered in ED Medications - No data to display   Initial Impression / Assessment and Plan / ED Course  I have reviewed the triage vital signs and the nursing notes.  Pertinent labs & imaging results that were available during my care of the patient were reviewed by me and considered in my medical decision making (see chart for details).     Patient presenting with discomfort of the left eye. Physical exam shows chemosis of the sclera. No signs of infection. No red flags eye pain including double vision, photophobia, pain within/behind the eye, or pain with EOM. Discussed case with attending, and Dr. Fayrene Fearing evaluated the patient. Will discharge patient with low dose steroid eyedrop and injections to follow-up with ophthalmology.  Additionally, patient blood pressure elevated. He states that he used to be on medication for blood pressure, but has not been taking any for a while. At this time, doubt emergent hypertensive sequela. He does not know the names or doses of the medicine he was on. Per chart review, patient has taken lisinopril 20 mg in the past. Will start him on this, and discussed importance of follow-up with primary care for management and further treatment of blood pressure. At this time, patient appears safe for discharge. Return precautions given. Patient states he understands agrees to plan.  Final Clinical Impressions(s) / ED Diagnoses   Final diagnoses:  Chemosis of conjunctiva, left  Hypertension, unspecified type    New Prescriptions Discharge Medication List as of 02/12/2017 10:56 PM    START  taking these medications   Details  fluorometholone (FML FORTE) 0.25 % ophthalmic suspension Place 1 drop into the left eye 4 (four) times daily., Starting Sat 02/12/2017, Print    lisinopril (PRINIVIL,ZESTRIL) 20 MG tablet Take 1 tablet (20 mg total) by mouth daily., Starting Sat 02/12/2017, Print         [place], [place], PA-C 02/13/17 0133    Rolland Porter, MD 02/22/17 1444

## 2017-02-12 NOTE — ED Notes (Signed)
RN Koleen Nimrod aware of pt blood pressure

## 2017-03-14 ENCOUNTER — Emergency Department (HOSPITAL_COMMUNITY)
Admission: EM | Admit: 2017-03-14 | Discharge: 2017-03-14 | Disposition: A | Discharge status: Home / Self Care | Payer: Self-pay | Attending: Emergency Medicine | Admitting: Emergency Medicine

## 2017-03-14 ENCOUNTER — Encounter (HOSPITAL_COMMUNITY): Payer: Self-pay | Admitting: Emergency Medicine

## 2017-03-14 ENCOUNTER — Emergency Department (HOSPITAL_COMMUNITY): Payer: Self-pay

## 2017-03-14 DIAGNOSIS — Z79899 Other long term (current) drug therapy: Secondary | ICD-10-CM | POA: Insufficient documentation

## 2017-03-14 DIAGNOSIS — I10 Essential (primary) hypertension: Secondary | ICD-10-CM | POA: Insufficient documentation

## 2017-03-14 DIAGNOSIS — F17210 Nicotine dependence, cigarettes, uncomplicated: Secondary | ICD-10-CM | POA: Insufficient documentation

## 2017-03-14 DIAGNOSIS — H5712 Ocular pain, left eye: Secondary | ICD-10-CM | POA: Insufficient documentation

## 2017-03-14 LAB — CBC WITH DIFFERENTIAL/PLATELET
Basophils Absolute: 0 10*3/uL (ref 0.0–0.1)
Basophils Relative: 0 %
Eosinophils Absolute: 0.3 10*3/uL (ref 0.0–0.7)
Eosinophils Relative: 4 %
HEMATOCRIT: 41.7 % (ref 39.0–52.0)
HEMOGLOBIN: 14.3 g/dL (ref 13.0–17.0)
LYMPHS ABS: 2.4 10*3/uL (ref 0.7–4.0)
Lymphocytes Relative: 33 %
MCH: 32.6 pg (ref 26.0–34.0)
MCHC: 34.3 g/dL (ref 30.0–36.0)
MCV: 95 fL (ref 78.0–100.0)
MONO ABS: 0.6 10*3/uL (ref 0.1–1.0)
MONOS PCT: 9 %
Neutro Abs: 3.9 10*3/uL (ref 1.7–7.7)
Neutrophils Relative %: 54 %
Platelets: 209 10*3/uL (ref 150–400)
RBC: 4.39 MIL/uL (ref 4.22–5.81)
RDW: 13 % (ref 11.5–15.5)
WBC: 7.2 10*3/uL (ref 4.0–10.5)

## 2017-03-14 LAB — BASIC METABOLIC PANEL
Anion gap: 8 (ref 5–15)
BUN: 7 mg/dL (ref 6–20)
CO2: 22 mmol/L (ref 22–32)
CREATININE: 0.98 mg/dL (ref 0.61–1.24)
Calcium: 8.7 mg/dL — ABNORMAL LOW (ref 8.9–10.3)
Chloride: 107 mmol/L (ref 101–111)
GFR calc Af Amer: >60 mL/min (ref >60)
Glucose, Bld: 100 mg/dL — ABNORMAL HIGH (ref 65–99)
Potassium: 3.5 mmol/L (ref 3.5–5.1)
SODIUM: 137 mmol/L (ref 135–145)

## 2017-03-14 MED ORDER — FLUORESCEIN SODIUM 0.6 MG OP STRP
1.0000 | ORAL_STRIP | Freq: Once | OPHTHALMIC | Status: AC
Start: 2017-03-14 — End: 2017-03-14
  Administered 2017-03-14: 1 via OPHTHALMIC
  Filled 2017-03-14: qty 1

## 2017-03-14 MED ORDER — LISINOPRIL 20 MG PO TABS: 20.0000 mg | ORAL_TABLET | Freq: Every day | ORAL | 0 refills | Status: DC

## 2017-03-14 MED ORDER — LISINOPRIL 20 MG PO TABS
20.0000 mg | ORAL_TABLET | Freq: Once | ORAL | Status: AC
  Administered 2017-03-14: 20 mg via ORAL
  Filled 2017-03-14: qty 1

## 2017-03-14 MED ORDER — TETRACAINE HCL 0.5 % OP SOLN
1.0000 [drp] | Freq: Once | OPHTHALMIC | Status: AC
Start: 2017-03-14 — End: 2017-03-14
  Administered 2017-03-14: 1 [drp] via OPHTHALMIC
  Filled 2017-03-14: qty 4

## 2017-03-14 NOTE — ED Triage Notes (Signed)
Patient is here with left eye pain.  Patient states that he has been here for this before, states that it started over a month ago and has become worse.  Left eye is red, it is tearing and he is having some blurry vision.  He is hypertensive and has been taking his blood pressure meds.  His left eye is sensitive to light.

## 2017-03-14 NOTE — ED Notes (Signed)
Pt states he feels as though he got something in his eye while at work. Pt's eye is red and swollen.

## 2017-03-14 NOTE — Discharge Instructions (Signed)
Go directly to the eye doctor.   Take your blood pressure medication as directed.   As we discussed, you need to follow-up with the referred, [facility] clinic regarding her high blood pressure. He needs to have reevaluation within 1 week.  Return the emergency Department for any worsening eye pain, vision changes, chest pain, shortness of breath, numbness in her arms or legs or any other worsening or concerning symptoms.

## 2017-03-14 NOTE — ED Provider Notes (Signed)
[facility] Provider Note   CSN: [id number] Arrival date & time: 03/14/17  0230     History   Chief Complaint Chief Complaint  Patient presents with  . Eye Problem    HPI Logan Brewer is a 61 y.o. male who presents with left eye pain, redness and swelling that began yesterday. Patient does report that he works in [employer] and is not sure if he got something in his eye, though does not feel a foreign body sensation. He reports that he will intermittently wear protective goggles while working. He reports associated photophobia and blurry vision and watery discharged from eye. He does not wear glasses or contacts.  He does not wear glasses or contacts. Patient had a similar episode of symptoms approximately 1 month ago. At that time he was given steroid eye drops and told to follow-up with optho. He states that in follow-up, optho "cut a tiny piece of flesh off" and notes that he had improvement in symptoms. He denies any fevers, chills, double vision, black spots in his visual fields, CP, SOB, numbness/weakness in his extremities.    The history is provided by the patient.    Past Medical History:  Diagnosis Date  . Hypertension     Patient Active Problem List   Diagnosis Date Noted  . HTN (hypertension) 04/26/2013  . Leukocytosis, unspecified 04/26/2013  . Radiculopathy, lumbosacral region 04/25/2013  . Back pain 04/25/2013  . Anxiety 04/25/2013    Past Surgical History:  Procedure Laterality Date  . APPENDECTOMY         Home Medications    Prior to Admission medications   Medication Sig Start Date End Date Taking? Authorizing Provider  Besifloxacin HCl 0.6 % SUSP Place 1 drop into the left eye 2 (two) times daily.   Yes [provider]  Difluprednate (DUREZOL) 0.05 % EMUL Place 1 drop into the left eye 3 (three) times daily.   Yes [provider]  fluorometholone (FML FORTE) 0.25 % ophthalmic suspension Place 1 drop into the left eye 4  (four) times daily. Patient not taking: Reported on 03/14/2017 02/12/17   Caccavale, Jeanette Caprice, PA-C  HYDROcodone-acetaminophen (NORCO/VICODIN) 5-325 MG tablet Take 1-2 tablets every 6 hours as needed for severe pain Patient not taking: Reported on 03/14/2017 12/01/15   Renne Crigler, PA-C  lisinopril (PRINIVIL,ZESTRIL) 20 MG tablet Take 1 tablet (20 mg total) by mouth daily. 03/14/17   Maxwell Caul, PA-C  methocarbamol (ROBAXIN) 500 MG tablet Take 2 tablets (1,000 mg total) by mouth every 8 (eight) hours as needed for muscle spasms. Patient not taking: Reported on 03/14/2017 12/01/15   Renne Crigler, PA-C  naproxen (NAPROSYN) 500 MG tablet Take 1 tablet (500 mg total) by mouth 2 (two) times daily. Patient not taking: Reported on 03/14/2017 12/01/15   Renne Crigler, PA-C    Family History No family history on file.  Social History Social History  Substance Use Topics  . Smoking status: Current Every Day Smoker    Packs/day: 0.00    Types: Cigarettes  . Smokeless tobacco: Never Used  . Alcohol use No     Allergies   Patient has no known allergies.   Review of Systems Review of Systems  Constitutional: Negative for fever.  Eyes: Positive for photophobia, pain, discharge (watery), redness and visual disturbance.  Respiratory: Negative for shortness of breath.   Cardiovascular: Negative for chest pain.  Neurological: Negative for weakness and numbness.     Physical Exam Updated Vital Signs BP Marland Kitchen)  177/82   Pulse 67   Temp 98 F (36.7 C) (Oral)   Resp 16   SpO2 97%   Physical Exam  Constitutional: He appears well-developed and well-nourished.  Sitting comfortably on examination table  HENT:  Head: Normocephalic and atraumatic.  Eyes: Pupils are equal, round, and reactive to light. Right eye exhibits no discharge. Left eye exhibits chemosis and discharge (watery). Left conjunctiva is injected. No scleral icterus.  Edema and erythema noted to the upper and lower eyelids of  the left eye with some surrounding periorbital edema. Small pupils bilaterally, but equally reactive. Subjective reports of pain with EOMS, particularly to the lateral side. No proptosis noted bilaterally. Patient is able to tell me how many fingers I'm holding up in both eyes.   Cardiovascular: Normal rate and regular rhythm.   Pulmonary/Chest: Effort normal.  Neurological: He is alert.  Cranial nerves III-XII intact Follows commands, Moves all extremities  5/5 strength to BUE and BLE  Sensation intact throughout all major nerve distributions No gait abnormalities  No slurred speech. No facial droop.   Skin: Skin is warm and dry.  Psychiatric: He has a normal mood and affect. His speech is normal and behavior is normal.  Nursing note and vitals reviewed.    ED Treatments / Results  Labs (all labs ordered are listed, but only abnormal results are displayed) Labs Reviewed  BASIC METABOLIC PANEL - Abnormal; Notable for the following:       Result Value   Glucose, Bld 100 (*)    Calcium 8.7 (*)    All other components within normal limits  CBC WITH DIFFERENTIAL/PLATELET    EKG  EKG Interpretation None       Radiology Ct Orbits Wo Contrast  Result Date: 03/14/2017 CLINICAL DATA:  Left eye pain. EXAM: CT ORBITS WITHOUT CONTRAST TECHNIQUE: Multidetector CT images were obtained using the standard protocol without intravenous contrast. COMPARISON:  None. FINDINGS: Orbits: No traumatic or inflammatory finding. Globes, optic nerves, orbital fat, extraocular muscles, vascular structures, and lacrimal glands are normal. Visualized sinuses: Small mucous retention cyst is seen in left maxillary sinus. No other abnormality is noted. Soft tissues: Negative. Limited intracranial: No significant or unexpected finding. IMPRESSION: No definite abnormality seen in the orbits. Electronically Signed   By: Lupita Raider, M.D.   On: 03/14/2017 07:20    Procedures Procedures (including critical  care time)  Medications Ordered in ED Medications  lisinopril (PRINIVIL,ZESTRIL) tablet 20 mg (20 mg Oral Given 03/14/17 0630)  tetracaine (PONTOCAINE) 0.5 % ophthalmic solution 1 drop (1 drop Left Eye Given 03/14/17 0630)  fluorescein ophthalmic strip 1 strip (1 strip Left Eye Given 03/14/17 0630)     Initial Impression / Assessment and Plan / ED Course  I have reviewed the triage vital signs and the nursing notes.  Pertinent labs & imaging results that were available during my care of the patient were reviewed by me and considered in my medical decision making (see chart for details).     61 y.o. M who presents with left eye pain that began yesterday. Associated with redness, photophobia, watery discharge, blurry vision, Patient is afebrile, non-toxic appearing, sitting comfortably on examination table. Vital signs reviewed. Patient is hypertensive. He states he is compliant with his medications. Will give his daily dose of meds and reassess. Consider preseptal vs orbital cellulitis vs allergic rhinitis vs conjunctivitis. Plan to check basic labs. Given that patient is having swelling and pain with EOMs, will obtain CT orbit for  evaluation of orbital cellulitis. Additionally, patient is hypertensive. He states that he took his medications but has not taken them for today. Will give home meds and reassess. Do not suspect hypertensive emergency.  Labs and imaging reviewed. Labs unremarkable. CT orbit is negative for any acute infectious etiology.   Woods lamp evaluation shows no fluorescein uptake, no dendritic lesions.  IOP: Right: 27, 31, 25         Left: 35, 25, 26  Will plan to consult optho for further evaluation. Records reviewed show that patient was referred to Dr. Sherryll Burger for further evaluation who he says he saw.   Repeat vitals show blood pressure is improved, though he is still hypertensive. Patient states that he has been taking his blood pressure medications. Patient has a history  of hypertensive and noncompliance with his medication. At his last ED visit, approximately 1 month ago, he was hypertensive with SBP in the 190s. At that time he was not taking his meds. He was started on Lisinopril 20 mg which he states he has been taking. He was referred to the [facility] clinic which he states he has not followed up with. Advised patient on the importance of follow-up with the [facility] for re-evaluation of HTN. At this time, patient is denying any headache, CP, numbness/weakness. Do not suspect HTN is the cause of his symptoms.   Discussed with Dr. Sherryll Burger (Optho). Will plan to see patient in his office today. Recommends patient be discharged from the ED and come directly to his office. Does not recommend starting patient on any meds.   Updated patient plan. Instructed him to go directly to the eye doctor's office. Patient expresses understanding and agreement to plan. We'll plan to provide short course of patient's hypertension medication since he has not been able to follow up with the wellness clinic and does not have refills on his medication. Encourage him to make an appointment within the next week for evaluation of his blood pressure. Strict return precautions discussed. Patient expresses understanding and agreement to plan.     Final Clinical Impressions(s) / ED Diagnoses   Final diagnoses:  Pain of left eye  Essential hypertension    New Prescriptions New Prescriptions   LISINOPRIL (PRINIVIL,ZESTRIL) 20 MG TABLET    Take 1 tablet (20 mg total) by mouth daily.     Maxwell Caul, PA-C 03/14/17 2038    Zadie Rhine, MD 03/14/17 2306

## 2017-06-21 ENCOUNTER — Encounter (HOSPITAL_COMMUNITY): Payer: Self-pay

## 2017-06-21 ENCOUNTER — Emergency Department (HOSPITAL_COMMUNITY)
Admission: EM | Admit: 2017-06-21 | Discharge: 2017-06-21 | Disposition: A | Discharge status: Home / Self Care | Payer: Self-pay | Attending: Emergency Medicine | Admitting: Emergency Medicine

## 2017-06-21 DIAGNOSIS — I10 Essential (primary) hypertension: Secondary | ICD-10-CM | POA: Insufficient documentation

## 2017-06-21 DIAGNOSIS — Z79899 Other long term (current) drug therapy: Secondary | ICD-10-CM | POA: Insufficient documentation

## 2017-06-21 DIAGNOSIS — Z113 Encounter for screening for infections with a predominantly sexual mode of transmission: Secondary | ICD-10-CM | POA: Insufficient documentation

## 2017-06-21 DIAGNOSIS — Z202 Contact with and (suspected) exposure to infections with a predominantly sexual mode of transmission: Secondary | ICD-10-CM | POA: Insufficient documentation

## 2017-06-21 DIAGNOSIS — F17210 Nicotine dependence, cigarettes, uncomplicated: Secondary | ICD-10-CM | POA: Insufficient documentation

## 2017-06-21 LAB — URINALYSIS, ROUTINE W REFLEX MICROSCOPIC
BILIRUBIN URINE: NEGATIVE
GLUCOSE, UA: NEGATIVE mg/dL
HGB URINE DIPSTICK: NEGATIVE
KETONES UR: NEGATIVE mg/dL
Leukocytes, UA: NEGATIVE
Nitrite: NEGATIVE
PROTEIN: NEGATIVE mg/dL
Specific Gravity, Urine: 1.023 (ref 1.005–1.030)
pH: 5 (ref 5.0–8.0)

## 2017-06-21 LAB — RAPID HIV SCREEN (HIV 1/2 AB+AG)
HIV 1/2 Antibodies: NONREACTIVE
HIV-1 P24 ANTIGEN - HIV24: NONREACTIVE

## 2017-06-21 MED ORDER — LIDOCAINE HCL (PF) 1 % IJ SOLN
INTRAMUSCULAR | Status: AC
  Administered 2017-06-21: 5 mL
  Filled 2017-06-21: qty 5

## 2017-06-21 MED ORDER — CEFTRIAXONE SODIUM 250 MG IJ SOLR
250.0000 mg | Freq: Once | INTRAMUSCULAR | Status: AC
  Administered 2017-06-21: 250 mg via INTRAMUSCULAR
  Filled 2017-06-21: qty 250

## 2017-06-21 MED ORDER — AZITHROMYCIN 250 MG PO TABS
1000.0000 mg | ORAL_TABLET | Freq: Once | ORAL | Status: AC
  Administered 2017-06-21: 1000 mg via ORAL
  Filled 2017-06-21: qty 4

## 2017-06-21 MED ORDER — ONDANSETRON 4 MG PO TBDP
4.0000 mg | ORAL_TABLET | Freq: Once | ORAL | Status: AC
  Administered 2017-06-21: 4 mg via ORAL
  Filled 2017-06-21: qty 1

## 2017-06-21 NOTE — ED Notes (Signed)
Pt c/o nausea.  EDP notified  

## 2017-06-21 NOTE — ED Notes (Signed)
ED Provider at bedside. 

## 2017-06-21 NOTE — ED Provider Notes (Signed)
[facility] EMERGENCY DEPARTMENT Provider Note   CSN: [id number] Arrival date & time: 06/21/17  1107     History   Chief Complaint Chief Complaint  Patient presents with  . SEXUALLY TRANSMITTED DISEASE    HPI Logan Brewer is a 62 y.o. male.  HPI   Logan Brewer is a 62yo male with a history of hypertension who presents to the emergency department for STD check.  Patient states that he had unprotected sexual intercourse with a male last week and since that time he has "not felt right."  States that he has a "warm feeling" in his genitalia.  Denies fever, chills, dysuria, urinary frequency, penile discharge, testicular swelling, testicular pain, flank pain, abdominal pain, nausea/vomiting, diarrhea, abdominal pain, lesions to the genitalia. He states that he has had STDs in the past, but is unsure which and would like to be tested and treated today.   Past Medical History:  Diagnosis Date  . Hypertension     Patient Active Problem List   Diagnosis Date Noted  . HTN (hypertension) 04/26/2013  . Leukocytosis, unspecified 04/26/2013  . Radiculopathy, lumbosacral region 04/25/2013  . Back pain 04/25/2013  . Anxiety 04/25/2013    Past Surgical History:  Procedure Laterality Date  . APPENDECTOMY         Home Medications    Prior to Admission medications   Medication Sig Start Date End Date Taking? Authorizing Provider  Besifloxacin HCl 0.6 % SUSP Place 1 drop into the left eye 2 (two) times daily.    [provider]  Difluprednate (DUREZOL) 0.05 % EMUL Place 1 drop into the left eye 3 (three) times daily.    [provider]  fluorometholone (FML FORTE) 0.25 % ophthalmic suspension Place 1 drop into the left eye 4 (four) times daily. Patient not taking: Reported on 03/14/2017 02/12/17   Caccavale, Jeanette Caprice, PA-C  HYDROcodone-acetaminophen (NORCO/VICODIN) 5-325 MG tablet Take 1-2 tablets every 6 hours as needed for severe pain Patient not  taking: Reported on 03/14/2017 12/01/15   Renne Crigler, PA-C  lisinopril (PRINIVIL,ZESTRIL) 20 MG tablet Take 1 tablet (20 mg total) by mouth daily. 03/14/17   Maxwell Caul, PA-C  methocarbamol (ROBAXIN) 500 MG tablet Take 2 tablets (1,000 mg total) by mouth every 8 (eight) hours as needed for muscle spasms. Patient not taking: Reported on 03/14/2017 12/01/15   Renne Crigler, PA-C  naproxen (NAPROSYN) 500 MG tablet Take 1 tablet (500 mg total) by mouth 2 (two) times daily. Patient not taking: Reported on 03/14/2017 12/01/15   Renne Crigler, PA-C    Family History No family history on file.  Social History Social History   Tobacco Use  . Smoking status: Current Every Day Smoker    Packs/day: 0.00    Types: Cigarettes  . Smokeless tobacco: Never Used  Substance Use Topics  . Alcohol use: No  . Drug use: Yes    Types: Marijuana     Allergies   Patient has no known allergies.   Review of Systems Review of Systems  Constitutional: Negative for chills and fever.  Gastrointestinal: Negative for abdominal pain, nausea and vomiting.  Genitourinary: Negative for difficulty urinating, discharge, dysuria, flank pain, frequency, genital sores, hematuria, penile pain, penile swelling, scrotal swelling and testicular pain.     Physical Exam Updated Vital Signs BP (!) 166/84 (BP Location: Right Arm)   Pulse 69   Temp 98.1 F (36.7 C) (Oral)   Resp 16   SpO2 98%   Physical  Exam  Constitutional: He is oriented to person, place, and time. He appears well-developed and well-nourished. No distress.  HENT:  Head: Normocephalic and atraumatic.  Eyes: Right eye exhibits no discharge. Left eye exhibits no discharge.  Cardiovascular: Normal rate and regular rhythm. Exam reveals no friction rub.  No murmur heard. Pulmonary/Chest: Effort normal and breath sounds normal. No stridor. No respiratory distress. He has no wheezes. He has no rales.  Abdominal: Soft. Bowel sounds are normal.  There is no tenderness. There is no guarding.  No CVA tenderness.  Genitourinary:  Genitourinary Comments: Chaperone present for exam. Uncircumcised male. No discharge from penis. No signs of lesion or erythema on the penis or testicles. The penis and testicles are nontender. No testicular masses.  Neurological: He is alert and oriented to person, place, and time. Coordination normal.  Skin: Skin is warm and dry. Capillary refill takes less than 2 seconds. He is not diaphoretic.  Psychiatric: He has a normal mood and affect. His behavior is normal.  Nursing note and vitals reviewed.    ED Treatments / Results  Labs (all labs ordered are listed, but only abnormal results are displayed) Labs Reviewed  URINALYSIS, ROUTINE W REFLEX MICROSCOPIC    EKG  EKG Interpretation None       Radiology No results found.  Procedures Procedures (including critical care time)  Medications Ordered in ED Medications - No data to display   Initial Impression / Assessment and Plan / ED Course  I have reviewed the triage vital signs and the nursing notes.  Pertinent labs & imaging results that were available during my care of the patient were reviewed by me and considered in my medical decision making (see chart for details).     Pt presents with concerns for possible STD. UA WNL. Pt understands that he has GC/Chlamydia, HIV and Syphilis testing pending and that he will need to inform all sexual partners if results return positive. Pt has been treated prophylactically with azithromycin and Rocephin due to request. Patient given information to follow up with health department for future STD exposure concerns. Discussed importance of using protection when sexually active.  His blood pressure was elevated in the emergency department today, counseled him to have this rechecked.  Patient agrees and voiced understanding to the above plan.  He has no complaints prior to discharge.  Final Clinical  Impressions(s) / ED Diagnoses   Final diagnoses:  STD exposure    ED Discharge Orders    None       Lawrence MarseillesShrosbree, Emily J, PA-C 06/21/17 1931    Little, Ambrose Finlandachel Morgan, MD 06/22/17 1506

## 2017-06-21 NOTE — ED Notes (Signed)
Signature pad not working properly. Pt verbalized understanding of d/c paperwork and had no questions prior to discharge.

## 2017-06-21 NOTE — ED Triage Notes (Signed)
Pt presents for evaluation of possible STD exposure. Pt denies dysuria or penile discharge. Pt reports "warm feeling" to groin.

## 2017-06-21 NOTE — Discharge Instructions (Signed)
You were treated for chlamydia and gonorrhea today in the emergency department.  He will receive a call if you test positive for chlamydia, gonorrhea, HIV or syphilis in the next 3 days.  If you do not receive a call then these results were negative.  If you receive a phone call you will need to inform all sexual partners so that they can be treated as well.  I have listed information below to the health department.  Please go to the health department for any future STD concerns.  Return to the emergency department if you have any new or worsening symptoms.

## 2017-06-22 LAB — GC/CHLAMYDIA PROBE AMP (~~LOC~~) NOT AT ARMC
CHLAMYDIA, DNA PROBE: NEGATIVE
NEISSERIA GONORRHEA: NEGATIVE

## 2017-06-23 LAB — RPR: RPR: NONREACTIVE

## 2017-10-04 ENCOUNTER — Encounter (HOSPITAL_COMMUNITY): Payer: Self-pay

## 2017-10-04 ENCOUNTER — Emergency Department (HOSPITAL_COMMUNITY)
Admission: EM | Admit: 2017-10-04 | Discharge: 2017-10-04 | Disposition: A | Discharge status: Home / Self Care | Payer: Self-pay | Attending: Emergency Medicine | Admitting: Emergency Medicine

## 2017-10-04 DIAGNOSIS — Z202 Contact with and (suspected) exposure to infections with a predominantly sexual mode of transmission: Secondary | ICD-10-CM | POA: Insufficient documentation

## 2017-10-04 DIAGNOSIS — I10 Essential (primary) hypertension: Secondary | ICD-10-CM | POA: Insufficient documentation

## 2017-10-04 DIAGNOSIS — Z711 Person with feared health complaint in whom no diagnosis is made: Secondary | ICD-10-CM | POA: Insufficient documentation

## 2017-10-04 DIAGNOSIS — F17210 Nicotine dependence, cigarettes, uncomplicated: Secondary | ICD-10-CM | POA: Insufficient documentation

## 2017-10-04 DIAGNOSIS — Z79899 Other long term (current) drug therapy: Secondary | ICD-10-CM | POA: Insufficient documentation

## 2017-10-04 MED ORDER — AZITHROMYCIN 250 MG PO TABS
1000.0000 mg | ORAL_TABLET | Freq: Once | ORAL | Status: AC
  Administered 2017-10-04: 1000 mg via ORAL
  Filled 2017-10-04: qty 4

## 2017-10-04 MED ORDER — LIDOCAINE HCL (PF) 1 % IJ SOLN
INTRAMUSCULAR | Status: AC
Start: 2017-10-04 — End: 2017-10-04
  Administered 2017-10-04: 5 mL
  Filled 2017-10-04: qty 5

## 2017-10-04 MED ORDER — CEFTRIAXONE SODIUM 250 MG IJ SOLR
250.0000 mg | Freq: Once | INTRAMUSCULAR | Status: AC
  Administered 2017-10-04: 250 mg via INTRAMUSCULAR
  Filled 2017-10-04: qty 250

## 2017-10-04 NOTE — ED Provider Notes (Signed)
[facility]   CSN: [id number] Arrival date & time: 10/04/17  1548     History   Chief Complaint Chief Complaint  Patient presents with  . Exposure to STD    HPI Logan Brewer is a 62 y.o. male with a past medical history of hypertension, who presents to ED for evaluation of burning sensation to penis.  Reports symptoms have been there for 3 days.  He does report unprotected sexual intercourse with a male partner last week.  He reports similar symptoms in the past which resolved with empiric treatment for gonorrhea and chlamydia.  He denies any dysuria, penile discharge, fevers or other complaints.  HPI  Past Medical History:  Diagnosis Date  . Hypertension     Patient Active Problem List   Diagnosis Date Noted  . HTN (hypertension) 04/26/2013  . Leukocytosis, unspecified 04/26/2013  . Radiculopathy, lumbosacral region 04/25/2013  . Back pain 04/25/2013  . Anxiety 04/25/2013    Past Surgical History:  Procedure Laterality Date  . APPENDECTOMY          Home Medications    Prior to Admission medications   Medication Sig Start Date End Date Taking? Authorizing Provider  Besifloxacin HCl 0.6 % SUSP Place 1 drop into the left eye 2 (two) times daily.    [provider]  Difluprednate (DUREZOL) 0.05 % EMUL Place 1 drop into the left eye 3 (three) times daily.    [provider]  fluorometholone (FML FORTE) 0.25 % ophthalmic suspension Place 1 drop into the left eye 4 (four) times daily. Patient not taking: Reported on 03/14/2017 02/12/17   Caccavale, Jeanette CapriceSophia, PA-C  HYDROcodone-acetaminophen (NORCO/VICODIN) 5-325 MG tablet Take 1-2 tablets every 6 hours as needed for severe pain Patient not taking: Reported on 03/14/2017 12/01/15   Renne CriglerGeiple, Joshua, PA-C  lisinopril (PRINIVIL,ZESTRIL) 20 MG tablet Take 1 tablet (20 mg total) by mouth daily. 03/14/17   Maxwell CaulLayden, Lindsey A, PA-C  methocarbamol (ROBAXIN) 500  MG tablet Take 2 tablets (1,000 mg total) by mouth every 8 (eight) hours as needed for muscle spasms. Patient not taking: Reported on 03/14/2017 12/01/15   Renne CriglerGeiple, Joshua, PA-C  naproxen (NAPROSYN) 500 MG tablet Take 1 tablet (500 mg total) by mouth 2 (two) times daily. Patient not taking: Reported on 03/14/2017 12/01/15   Renne CriglerGeiple, Joshua, PA-C    Family History No family history on file.  Social History Social History   Tobacco Use  . Smoking status: Current Every Day Smoker    Packs/day: 0.00    Types: Cigarettes  . Smokeless tobacco: Never Used  Substance Use Topics  . Alcohol use: No  . Drug use: Yes    Types: Marijuana     Allergies   Patient has no known allergies.   Review of Systems Review of Systems  Constitutional: Negative for diaphoresis and fever.  Genitourinary: Negative for discharge, dysuria, hematuria, penile pain and penile swelling.       +burning sensation to penis  Skin: Negative for rash.     Physical Exam Updated Vital Signs BP (!) 180/90 (BP Location: Right Arm)   Pulse 72   Temp 98.2 F (36.8 C) (Oral)   Resp 16   SpO2 95%   Physical Exam  Constitutional: He appears well-developed and well-nourished. No distress.  Nontoxic appearing and in no acute distress.  HENT:  Head: Normocephalic and atraumatic.  Eyes: Conjunctivae and EOM are normal. No scleral icterus.  Neck: Normal range of motion.  Pulmonary/Chest: Effort normal. No respiratory distress.  Genitourinary: Right testis shows no swelling and no tenderness. Left testis shows no swelling and no tenderness. Uncircumcised. No penile erythema. No discharge found.  Genitourinary Comments: Normal male genitalia noted. Penis, scrotum, and testicles without swelling, lesions, rashes, or tenderness present. No penile discharge noted. Cremasteric reflex intact. RN served as Biomedical engineer during the exam.  Neurological: He is alert.  Skin: No rash noted. He is not diaphoretic.  Psychiatric: He has a  normal mood and affect.  Nursing note and vitals reviewed.    ED Treatments / Results  Labs (all labs ordered are listed, but only abnormal results are displayed) Labs Reviewed  HIV ANTIBODY (ROUTINE TESTING)  RPR  GC/CHLAMYDIA PROBE AMP ([place]) NOT AT [facility]    EKG None  Radiology No results found.  Procedures Procedures (including critical care time)  Medications Ordered in ED Medications  cefTRIAXone (ROCEPHIN) injection 250 mg (has no administration in time range)  azithromycin (ZITHROMAX) tablet 1,000 mg (has no administration in time range)     Initial Impression / Assessment and Plan / ED Course  I have reviewed the triage vital signs and the nursing notes.  Pertinent labs & imaging results that were available during my care of the patient were reviewed by me and considered in my medical decision making (see chart for details).     Patient presents to ED for evaluation of a "burning" sensation to the penis he does report sexual intercourse and all partner last week.  He reports history of similar symptoms in the past, which resolved with IM Rocephin and p.o. azithromycin.  Has not noticed any rashes.  No discharge, dysuria or fevers.  On physical exam there are no rashes or lesions present.  STD panel pending at this time.  Patient will be empirically treated for gonorrhea and chlamydia and advised to follow-up with results of remaining lab work when available.  Advised to return for any severe worsening symptoms.  Portions of this note were generated with Scientist, clinical (histocompatibility and immunogenetics). Dictation errors may occur despite best attempts at proofreading.   Final Clinical Impressions(s) / ED Diagnoses   Final diagnoses:  Concern about STD in male without diagnosis    ED Discharge Orders    None       Dietrich Pates, PA-C 10/04/17 1656    Wynetta Fines, MD 10/05/17 1306

## 2017-10-04 NOTE — ED Notes (Signed)
Pt denies any pain or d/c reports having unprotected sex and states "I just want to get checked out to be safe"

## 2017-10-04 NOTE — ED Triage Notes (Signed)
Pt presents with concern over possible STD. Burning to penis, no discharge, no burning with urination. Pt reports unprotected intercourse last week.

## 2017-10-05 LAB — HIV ANTIBODY (ROUTINE TESTING W REFLEX): HIV Screen 4th Generation wRfx: NONREACTIVE

## 2017-10-05 LAB — RPR: RPR Ser Ql: NONREACTIVE

## 2017-10-05 LAB — GC/CHLAMYDIA PROBE AMP (~~LOC~~) NOT AT ARMC
CHLAMYDIA, DNA PROBE: NEGATIVE
NEISSERIA GONORRHEA: NEGATIVE

## 2017-11-12 ENCOUNTER — Encounter (HOSPITAL_COMMUNITY): Payer: Self-pay | Admitting: Emergency Medicine

## 2017-11-12 ENCOUNTER — Emergency Department (HOSPITAL_COMMUNITY)
Admission: EM | Admit: 2017-11-12 | Discharge: 2017-11-13 | Disposition: A | Discharge status: Home / Self Care | Payer: Self-pay | Attending: Emergency Medicine | Admitting: Emergency Medicine

## 2017-11-12 ENCOUNTER — Other Ambulatory Visit: Payer: Self-pay

## 2017-11-12 DIAGNOSIS — R300 Dysuria: Secondary | ICD-10-CM | POA: Insufficient documentation

## 2017-11-12 DIAGNOSIS — I158 Other secondary hypertension: Secondary | ICD-10-CM | POA: Insufficient documentation

## 2017-11-12 DIAGNOSIS — F17210 Nicotine dependence, cigarettes, uncomplicated: Secondary | ICD-10-CM | POA: Insufficient documentation

## 2017-11-12 DIAGNOSIS — Z79899 Other long term (current) drug therapy: Secondary | ICD-10-CM | POA: Insufficient documentation

## 2017-11-12 LAB — BASIC METABOLIC PANEL
ANION GAP: 10 (ref 5–15)
BUN: 14 mg/dL (ref 6–20)
CHLORIDE: 107 mmol/L (ref 101–111)
CO2: 23 mmol/L (ref 22–32)
Calcium: 8.7 mg/dL — ABNORMAL LOW (ref 8.9–10.3)
Creatinine, Ser: 1.48 mg/dL — ABNORMAL HIGH (ref 0.61–1.24)
GFR calc non Af Amer: 50 mL/min — ABNORMAL LOW (ref >60)
GFR, EST AFRICAN AMERICAN: 58 mL/min — AB (ref >60)
Glucose, Bld: 99 mg/dL (ref 65–99)
POTASSIUM: 3.3 mmol/L — AB (ref 3.5–5.1)
Sodium: 140 mmol/L (ref 135–145)

## 2017-11-12 LAB — URINALYSIS, ROUTINE W REFLEX MICROSCOPIC
BACTERIA UA: NONE SEEN
Glucose, UA: NEGATIVE mg/dL
KETONES UR: NEGATIVE mg/dL
LEUKOCYTES UA: NEGATIVE
NITRITE: NEGATIVE
PROTEIN: 30 mg/dL — AB
SPECIFIC GRAVITY, URINE: 1.032 — AB (ref 1.005–1.030)
pH: 5 (ref 5.0–8.0)

## 2017-11-12 LAB — CBC
HEMATOCRIT: 40.3 % (ref 39.0–52.0)
HEMOGLOBIN: 13.6 g/dL (ref 13.0–17.0)
MCH: 32.5 pg (ref 26.0–34.0)
MCHC: 33.7 g/dL (ref 30.0–36.0)
MCV: 96.2 fL (ref 78.0–100.0)
Platelets: 246 10*3/uL (ref 150–400)
RBC: 4.19 MIL/uL — ABNORMAL LOW (ref 4.22–5.81)
RDW: 12.9 % (ref 11.5–15.5)
WBC: 5.8 10*3/uL (ref 4.0–10.5)

## 2017-11-12 NOTE — ED Triage Notes (Addendum)
Reports intermitant burning to penis since having sex last Saturday.  Wants it checked out.  Denies having any discharge. Bp noted to be elevated in triage.  Patient reports hx of htn but does not take anything currently.  States its been a while since I took anything.  Reports finishing the script and never got it refilled.

## 2017-11-13 MED ORDER — HYDROCHLOROTHIAZIDE 25 MG PO TABS
25.0000 mg | ORAL_TABLET | Freq: Every day | ORAL | Status: DC
  Administered 2017-11-13: 25 mg via ORAL
  Filled 2017-11-13: qty 1

## 2017-11-13 MED ORDER — HYDROCHLOROTHIAZIDE 25 MG PO TABS: 25.0000 mg | ORAL_TABLET | Freq: Every day | ORAL | 1 refills | Status: DC

## 2017-11-13 MED ORDER — METRONIDAZOLE 500 MG PO TABS
2000.0000 mg | ORAL_TABLET | Freq: Once | ORAL | Status: AC
  Administered 2017-11-13: 2000 mg via ORAL
  Filled 2017-11-13: qty 4

## 2017-11-13 NOTE — ED Notes (Signed)
No response when called for vitals.  

## 2017-11-13 NOTE — ED Provider Notes (Signed)
[facility] EMERGENCY DEPARTMENT Provider Note  CSN: [id number] Arrival date & time: 11/12/17 2100  Chief Complaint(s) Penis Pain  HPI Logan Brewer is a 62 y.o. male with a history of hypertension not currently on medication, reported prior sexually transmitted disease presents to the emergency department with several days of penile dysuria.   pain is exacerbated with urinating.  No alleviating factors.  Denies any bleeding or discharge.  Denies any irritation.  He endorses being sexually active and not using protection.  He denies any abdominal pain.  No fevers or chills.  No scrotal pain or swelling.  No nausea or vomiting.  No diarrhea.  Patient denies any chest pain or shortness of breath.  No headache.  HPI  Past Medical History Past Medical History:  Diagnosis Date  . Hypertension    Patient Active Problem List   Diagnosis Date Noted  . HTN (hypertension) 04/26/2013  . Leukocytosis, unspecified 04/26/2013  . Radiculopathy, lumbosacral region 04/25/2013  . Back pain 04/25/2013  . Anxiety 04/25/2013   Home Medication(s) Prior to Admission medications   Medication Sig Start Date End Date Taking? Authorizing Provider  Besifloxacin HCl 0.6 % SUSP Place 1 drop into the left eye 2 (two) times daily.    [provider]  Difluprednate (DUREZOL) 0.05 % EMUL Place 1 drop into the left eye 3 (three) times daily.    [provider]  fluorometholone (FML FORTE) 0.25 % ophthalmic suspension Place 1 drop into the left eye 4 (four) times daily. Patient not taking: Reported on 03/14/2017 02/12/17   Caccavale, Sophia, PA-C  hydrochlorothiazide (HYDRODIURIL) 25 MG tablet Take 1 tablet (25 mg total) by mouth daily. 11/13/17 01/12/18  Nira Conn, MD  HYDROcodone-acetaminophen (NORCO/VICODIN) 5-325 MG tablet Take 1-2 tablets every 6 hours as needed for severe pain Patient not taking: Reported on 03/14/2017 12/01/15   Renne Crigler, PA-C  lisinopril  (PRINIVIL,ZESTRIL) 20 MG tablet Take 1 tablet (20 mg total) by mouth daily. 03/14/17   Maxwell Caul, PA-C  methocarbamol (ROBAXIN) 500 MG tablet Take 2 tablets (1,000 mg total) by mouth every 8 (eight) hours as needed for muscle spasms. Patient not taking: Reported on 03/14/2017 12/01/15   Renne Crigler, PA-C  naproxen (NAPROSYN) 500 MG tablet Take 1 tablet (500 mg total) by mouth 2 (two) times daily. Patient not taking: Reported on 03/14/2017 12/01/15   Renne Crigler, PA-C                                                                                                                                    Past Surgical History Past Surgical History:  Procedure Laterality Date  . APPENDECTOMY     Family History No family history on file.  Social History Social History   Tobacco Use  . Smoking status: Current Every Day Smoker    Packs/day: 0.00    Types: Cigarettes  . Smokeless tobacco: Never Used  Substance Use Topics  . Alcohol use: No  . Drug use: Yes    Types: Marijuana   Allergies Patient has no known allergies.  Review of Systems Review of Systems All other systems are reviewed and are negative for acute change except as noted in the HPI  Physical Exam Vital Signs  I have reviewed the triage vital signs BP (!) 190/92   Pulse 62   Temp 98.7 F (37.1 C) (Oral)   Resp 18   Ht  (1.778 m)   Wt 72.6 kg (160 lb)   SpO2 100%   BMI 22.96 kg/m   Physical Exam  Constitutional: He is oriented to person, place, and time. He appears well-developed and well-nourished. No distress.  HENT:  Head: Normocephalic and atraumatic.  Nose: Nose normal.  Eyes: Pupils are equal, round, and reactive to light. Conjunctivae and EOM are normal. Right eye exhibits no discharge. Left eye exhibits no discharge. No scleral icterus.  Neck: Normal range of motion. Neck supple.  Cardiovascular: Normal rate and regular rhythm. Exam reveals no gallop and no friction rub.  No murmur  heard. Pulmonary/Chest: Effort normal and breath sounds normal. No stridor. No respiratory distress. He has no rales.  Abdominal: Soft. He exhibits no distension. There is no tenderness.  Genitourinary: Penis normal. Right testis shows no swelling and no tenderness. Left testis shows no swelling and no tenderness. Uncircumcised. No penile erythema or penile tenderness. No discharge found.  Musculoskeletal: He exhibits no edema or tenderness.  Neurological: He is alert and oriented to person, place, and time.  Skin: Skin is warm and dry. No rash noted. He is not diaphoretic. No erythema.  Psychiatric: He has a normal mood and affect.  Vitals reviewed.   ED Results and Treatments Labs (all labs ordered are listed, but only abnormal results are displayed) Labs Reviewed  CBC - Abnormal; Notable for the following components:      Result Value   RBC 4.19 (*)    All other components within normal limits  BASIC METABOLIC PANEL - Abnormal; Notable for the following components:   Potassium 3.3 (*)    Creatinine, Ser 1.48 (*)    Calcium 8.7 (*)    GFR calc non Af Amer 50 (*)    GFR calc Af Amer 58 (*)    All other components within normal limits  URINALYSIS, ROUTINE W REFLEX MICROSCOPIC - Abnormal; Notable for the following components:   Specific Gravity, Urine 1.032 (*)    Hgb urine dipstick SMALL (*)    Bilirubin Urine SMALL (*)    Protein, ur 30 (*)    All other components within normal limits  GC/CHLAMYDIA PROBE AMP ([place]) NOT AT [facility]                                                                                                                         EKG  EKG Interpretation  Date/Time:    Ventricular Rate:    PR  Interval:    QRS Duration:   QT Interval:    QTC Calculation:   R Axis:     Text Interpretation:        Radiology No results found. Pertinent labs & imaging results that were available during my care of the patient were reviewed by me and considered in my  medical decision making (see chart for details).  Medications Ordered in ED Medications  hydrochlorothiazide (HYDRODIURIL) tablet 25 mg (25 mg Oral Given 11/13/17 1034)  metroNIDAZOLE (FLAGYL) tablet 2,000 mg (2,000 mg Oral Given 11/13/17 1033)                                                                                                                                    Procedures Procedures  (including critical care time)  Medical Decision Making / ED Course I have reviewed the nursing notes for this encounter and the patient's prior records (if available in EHR or on provided paperwork).    1. Dysuria Patient has been seen here multiple times in the last 6 months with concern for sexually transmitted disease.  GC/chlamydia have been negative.  HIV, RPR negative as well.  Currently patient does not have any discharge or bleeding.  UA without evidence of urinary tract infection.  Will send GC/chlamydia and await results before treating empirically.  On record review, I do not see that patient has been treated empirically for trichomonas.  I will do so today with 2 g of Flagyl.  2.  Patient also noted to be hypertensive.  He is currently asymptomatic.  Labs with mild renal insufficiency,  without failure.  He is denying any chest pain or shortness of breath or focal deficits on exam concerning for end organ damage. Will start the patient on antihypertensive and recommend establishing care with a primary care provider for continued management.  Final Clinical Impression(s) / ED Diagnoses Final diagnoses:  Dysuria  Other secondary hypertension    Disposition: Discharge  Condition: Good  I have discussed the results, Dx and Tx plan with the patient who expressed understanding and agree(s) with the plan. Discharge instructions discussed at great length. The patient was given strict return precautions who verbalized understanding of the instructions. No further questions at time of  discharge.    ED Discharge Orders        Ordered    hydrochlorothiazide (HYDRODIURIL) 25 MG tablet  Daily     11/13/17 1149       Follow Up: primary care provider   If you do not have a primary care physician, contact [facility] at [phone] for referral     This chart was dictated using voice recognition software.  Despite best efforts to proofread,  errors can occur which can change the documentation meaning.   Nira Conn, MD 11/13/17 1150

## 2017-11-13 NOTE — ED Notes (Signed)
D/C signature not obtained d/t malfunction in computer

## 2017-11-13 NOTE — Discharge Instructions (Addendum)
You will be contacted if your gonorrhea/chlamydia test is positive.   Please establish care with a primary care provider for continued management of your hypertension.

## 2018-06-12 ENCOUNTER — Emergency Department (HOSPITAL_COMMUNITY)
Admission: EM | Admit: 2018-06-12 | Discharge: 2018-06-12 | Disposition: A | Discharge status: Home / Self Care | Payer: Self-pay | Attending: Emergency Medicine | Admitting: Emergency Medicine

## 2018-06-12 ENCOUNTER — Encounter (HOSPITAL_COMMUNITY): Payer: Self-pay | Admitting: Emergency Medicine

## 2018-06-12 ENCOUNTER — Other Ambulatory Visit: Payer: Self-pay

## 2018-06-12 ENCOUNTER — Emergency Department (HOSPITAL_COMMUNITY): Payer: Self-pay

## 2018-06-12 DIAGNOSIS — M5416 Radiculopathy, lumbar region: Secondary | ICD-10-CM | POA: Insufficient documentation

## 2018-06-12 DIAGNOSIS — F1290 Cannabis use, unspecified, uncomplicated: Secondary | ICD-10-CM | POA: Insufficient documentation

## 2018-06-12 DIAGNOSIS — M5136 Other intervertebral disc degeneration, lumbar region: Secondary | ICD-10-CM | POA: Insufficient documentation

## 2018-06-12 DIAGNOSIS — F17210 Nicotine dependence, cigarettes, uncomplicated: Secondary | ICD-10-CM | POA: Insufficient documentation

## 2018-06-12 DIAGNOSIS — I10 Essential (primary) hypertension: Secondary | ICD-10-CM | POA: Insufficient documentation

## 2018-06-12 LAB — I-STAT CHEM 8, ED
BUN: 11 mg/dL (ref 6–20)
CALCIUM ION: 1.18 mmol/L (ref 1.15–1.40)
Chloride: 106 mmol/L (ref 98–111)
Creatinine, Ser: 1.2 mg/dL (ref 0.61–1.24)
Glucose, Bld: 112 mg/dL — ABNORMAL HIGH (ref 70–99)
HEMATOCRIT: 44 % (ref 39.0–52.0)
HEMOGLOBIN: 15 g/dL (ref 13.0–17.0)
Potassium: 3.8 mmol/L (ref 3.5–5.1)
SODIUM: 141 mmol/L (ref 135–145)
TCO2: 23 mmol/L (ref 22–32)

## 2018-06-12 MED ORDER — HYDROCODONE-ACETAMINOPHEN 5-325 MG PO TABS: 1.0000 | ORAL_TABLET | Freq: Four times a day (QID) | ORAL | 0 refills | Status: DC | PRN

## 2018-06-12 MED ORDER — PREDNISONE 10 MG PO TABS: ORAL_TABLET | ORAL | 0 refills | Status: AC

## 2018-06-12 MED ORDER — DEXAMETHASONE SODIUM PHOSPHATE 10 MG/ML IJ SOLN
10.0000 mg | Freq: Once | INTRAMUSCULAR | Status: AC
  Administered 2018-06-12: 10 mg via INTRAMUSCULAR
  Filled 2018-06-12: qty 1

## 2018-06-12 MED ORDER — AMLODIPINE BESYLATE 5 MG PO TABS: 5.0000 mg | ORAL_TABLET | Freq: Every day | ORAL | 0 refills | Status: DC

## 2018-06-12 MED ORDER — MORPHINE SULFATE (PF) 4 MG/ML IV SOLN
8.0000 mg | Freq: Once | INTRAVENOUS | Status: AC
  Administered 2018-06-12: 8 mg via INTRAMUSCULAR
  Filled 2018-06-12: qty 2

## 2018-06-12 MED ORDER — KETOROLAC TROMETHAMINE 60 MG/2ML IM SOLN
60.0000 mg | Freq: Once | INTRAMUSCULAR | Status: AC
  Administered 2018-06-12: 60 mg via INTRAMUSCULAR
  Filled 2018-06-12: qty 2

## 2018-06-12 NOTE — Discharge Instructions (Signed)
For your back pain: Take the steroids (prednisone) and pain meds as prescribed.   For your high blood pressure: Take the amlodipine (blood pressure medicine) daily. Follow-up with a regular doctor to discuss further medication changes and for a refill.  For your other complaints: See doctor as above.

## 2018-06-12 NOTE — ED Notes (Signed)
Patient transported to X-ray 

## 2018-06-12 NOTE — ED Triage Notes (Signed)
Pt reports back and bilateral leg pain that is chronic, has gotten steroid injection in past, states it has gotten worse in the last 2 weeks.

## 2018-06-12 NOTE — ED Notes (Signed)
Pt ambulated to restroom from hallway bed, tolerated well. 

## 2018-06-12 NOTE — ED Notes (Signed)
Discharge instructions and prescriptions discussed with Pt. Pt verbalized understanding. Pt stable and ambulatory.   

## 2018-06-12 NOTE — ED Provider Notes (Signed)
[facility] EMERGENCY DEPARTMENT Provider Note   CSN: [id number] Arrival date & time: 06/12/18  4098     History   Chief Complaint Chief Complaint  Patient presents with  . Leg Pain  . Hypertension    HPI Logan Brewer is a 62 y.o. male.  HPI   62 yo M with h/o lumbosacral radiculopathy here with worsening back and leg pain. Pt reports that over the past 2 weeks, he's had progressively worsening sharp, severe lower back pain. The pain is worse with movement, palpation, and any bending/twisting. He works in [employer] and has been working more htan usual. Denies any trauma to the area. He states the pain shoots down his b/l legs intermittently, particularly when moving or twisting. Has not seen a spine specialist recently. No fever or chills. No loss of bowel or bladder function. No LE weakness or numbness. No weight loss or night sweats. Pt also notes that because of this pain, his BP has been elevated. No HA, vision changes, focal numbness or weakness. No CP, SOB, palpitations.  Past Medical History:  Diagnosis Date  . Hypertension     Patient Active Problem List   Diagnosis Date Noted  . HTN (hypertension) 04/26/2013  . Leukocytosis, unspecified 04/26/2013  . Radiculopathy, lumbosacral region 04/25/2013  . Back pain 04/25/2013  . Anxiety 04/25/2013    Past Surgical History:  Procedure Laterality Date  . APPENDECTOMY          Home Medications    Prior to Admission medications   Medication Sig Start Date End Date Taking? Authorizing Provider  amLODipine (NORVASC) 5 MG tablet Take 1 tablet (5 mg total) by mouth daily. 06/12/18 07/12/18  Shaune Pollack, MD  Besifloxacin HCl 0.6 % SUSP Place 1 drop into the left eye 2 (two) times daily.    [provider]  Difluprednate (DUREZOL) 0.05 % EMUL Place 1 drop into the left eye 3 (three) times daily.    [provider]  fluorometholone (FML FORTE) 0.25 % ophthalmic suspension Place  1 drop into the left eye 4 (four) times daily. Patient not taking: Reported on 03/14/2017 02/12/17   Caccavale, Sophia, PA-C  hydrochlorothiazide (HYDRODIURIL) 25 MG tablet Take 1 tablet (25 mg total) by mouth daily. 11/13/17 01/12/18  Nira Conn, MD  HYDROcodone-acetaminophen (NORCO/VICODIN) 5-325 MG tablet Take 1-2 tablets by mouth every 6 (six) hours as needed for moderate pain or severe pain. 06/12/18   Shaune Pollack, MD  lisinopril (PRINIVIL,ZESTRIL) 20 MG tablet Take 1 tablet (20 mg total) by mouth daily. 03/14/17   Maxwell Caul, PA-C  methocarbamol (ROBAXIN) 500 MG tablet Take 2 tablets (1,000 mg total) by mouth every 8 (eight) hours as needed for muscle spasms. Patient not taking: Reported on 03/14/2017 12/01/15   Renne Crigler, PA-C  naproxen (NAPROSYN) 500 MG tablet Take 1 tablet (500 mg total) by mouth 2 (two) times daily. Patient not taking: Reported on 03/14/2017 12/01/15   Renne Crigler, PA-C  predniSONE (DELTASONE) 10 MG tablet Take 4 tablets (40 mg total) by mouth daily for 2 days, THEN 3 tablets (30 mg total) daily for 2 days, THEN 2 tablets (20 mg total) daily for 2 days, THEN 1 tablet (10 mg total) daily for 2 days. 06/12/18 06/20/18  Shaune Pollack, MD    Family History History reviewed. No pertinent family history.  Social History Social History   Tobacco Use  . Smoking status: Current Every Day Smoker    Packs/day: 0.00  Types: Cigarettes  . Smokeless tobacco: Never Used  Substance Use Topics  . Alcohol use: No  . Drug use: Yes    Types: Marijuana    Comment: occasionally      Allergies   Patient has no known allergies.   Review of Systems Review of Systems  Constitutional: Negative for chills, fatigue and fever.  HENT: Negative for congestion and rhinorrhea.   Eyes: Negative for visual disturbance.  Respiratory: Negative for cough, shortness of breath and wheezing.   Cardiovascular: Negative for chest pain and leg swelling.    Gastrointestinal: Negative for abdominal pain, diarrhea, nausea and vomiting.  Genitourinary: Negative for dysuria and flank pain.  Musculoskeletal: Positive for arthralgias, back pain and gait problem (2/2 pain). Negative for neck pain and neck stiffness.  Skin: Negative for rash and wound.  Allergic/Immunologic: Negative for immunocompromised state.  Neurological: Negative for syncope, weakness and headaches.  All other systems reviewed and are negative.    Physical Exam Updated Vital Signs BP (!) 172/97   Pulse 72   Temp 97.6 F (36.4 C) (Oral)   Resp 16   Ht 6' (1.829 m)   Wt 83.9 kg   SpO2 98%   BMI 25.09 kg/m   Physical Exam Vitals signs and nursing note reviewed.  Constitutional:      General: He is not in acute distress.    Appearance: He is well-developed.  HENT:     Head: Normocephalic and atraumatic.  Eyes:     Conjunctiva/sclera: Conjunctivae normal.  Neck:     Musculoskeletal: Neck supple.  Cardiovascular:     Rate and Rhythm: Normal rate and regular rhythm.     Heart sounds: Normal heart sounds. No murmur. No friction rub.  Pulmonary:     Effort: Pulmonary effort is normal. No respiratory distress.     Breath sounds: Normal breath sounds. No wheezing or rales.  Abdominal:     General: There is no distension.     Palpations: Abdomen is soft.     Tenderness: There is no abdominal tenderness.  Skin:    General: Skin is warm.     Capillary Refill: Capillary refill takes less than 2 seconds.  Neurological:     Mental Status: He is alert and oriented to person, place, and time.     Motor: No abnormal muscle tone.      Spine Exam: Inspection/Palpation: Moderate paraspinal and midline TTP across lower lumbar spine. No bruising or deformity. No step-offs. Strength: 5/5 throughout LE bilaterally (hip flexion/extension, adduction/abduction; knee flexion/extension; foot dorsiflexion/plantarflexion, inversion/eversion; great toe inversion) Sensation: Intact  to light touch in proximal and distal LE bilaterally Reflexes: 2+ quadriceps and achilles reflexes  ED Treatments / Results  Labs (all labs ordered are listed, but only abnormal results are displayed) Labs Reviewed  I-STAT CHEM 8, ED - Abnormal; Notable for the following components:      Result Value   Glucose, Bld 112 (*)    All other components within normal limits    EKG None  Radiology Dg Lumbar Spine Complete  Result Date: 06/12/2018 CLINICAL DATA:  Increasing [place] back pain over the past 2-3 weeks. No known injury. EXAM: LUMBAR SPINE - COMPLETE 4+ VIEW COMPARISON:  MRI lumbar spine 04/25/2013. FINDINGS: Vertebral body height is maintained. 0.4 cm retrolisthesis L2 on L3 is unchanged. Loss of disc space height and endplate spurring are most notable at L2-3. Lower lumbar facet arthropathy is seen. Paraspinous structures demonstrate atherosclerosis. IMPRESSION: No acute abnormality. Degenerative disease appearing  most notable at L2-3. Atherosclerosis. Electronically Signed   By: Drusilla Kannerhomas  Dalessio M.D.   On: 06/12/2018 10:24    Procedures Procedures (including critical care time)  Medications Ordered in ED Medications  ketorolac (TORADOL) injection 60 mg (has no administration in time range)  morphine 4 MG/ML injection 8 mg (8 mg Intramuscular Given 06/12/18 0954)  dexamethasone (DECADRON) injection 10 mg (10 mg Intramuscular Given 06/12/18 0954)     Initial Impression / Assessment and Plan / ED Course  I have reviewed the triage vital signs and the nursing notes.  Pertinent labs & imaging results that were available during my care of the patient were reviewed by me and considered in my medical decision making (see chart for details).  Clinical Course as of Jun 12 1038  Mon Jun 12, 2018  0939 62 yo M here with acute on chronic lower back pain 2/2 recently increased work in [employer], with h/o known radiculopathy and DDD. No red flags. No fever, chills, night sweats, weight  loss, or sx to suggest infection or malignancy. No LE weakness, numbness, or physical exam findings to suggest cauda equina or cord compression. Given age, will check XR, give IM analgesia. Pt also with HTN but is asymptomatic - no pulsatile abdominal pass, pain is known to localize to his back and do not suspect dissection or AAA. He has been out of his meds. Will tx pain and re-assess.   [CI]  1002 Lytes acceptable for re-starting his home meds - will f/u CXR. IM meds given.   [CI]  1027 Plain films show degenerative disease, no acute abnormality or bony lesions.   [CI]  1028 Will tx with steroids, analgesics and outpt spine referral. Given pt's baseline CKD and the fact that he has been off all meds, will re-start on low-dose norvasc for now with PCP f/u.   [CI]    Clinical Course User Index [CI] Shaune PollackIsaacs, Dorenda Pfannenstiel, MD     Final Clinical Impressions(s) / ED Diagnoses   Final diagnoses:  Lumbar radiculopathy  Degenerative disc disease, lumbar    ED Discharge Orders         Ordered    HYDROcodone-acetaminophen (NORCO/VICODIN) 5-325 MG tablet  Every 6 hours PRN     06/12/18 1032    predniSONE (DELTASONE) 10 MG tablet     06/12/18 1032    amLODipine (NORVASC) 5 MG tablet  Daily     06/12/18 1032           Shaune PollackIsaacs, Dylan Ruotolo, MD 06/12/18 1039

## 2018-07-16 ENCOUNTER — Emergency Department (HOSPITAL_COMMUNITY)
Admission: EM | Admit: 2018-07-16 | Discharge: 2018-07-16 | Disposition: A | Discharge status: Home / Self Care | Payer: Self-pay | Attending: Emergency Medicine | Admitting: Emergency Medicine

## 2018-07-16 ENCOUNTER — Encounter (HOSPITAL_COMMUNITY): Payer: Self-pay

## 2018-07-16 DIAGNOSIS — I10 Essential (primary) hypertension: Secondary | ICD-10-CM | POA: Insufficient documentation

## 2018-07-16 DIAGNOSIS — M5442 Lumbago with sciatica, left side: Secondary | ICD-10-CM | POA: Insufficient documentation

## 2018-07-16 DIAGNOSIS — Z79899 Other long term (current) drug therapy: Secondary | ICD-10-CM | POA: Insufficient documentation

## 2018-07-16 DIAGNOSIS — F17210 Nicotine dependence, cigarettes, uncomplicated: Secondary | ICD-10-CM | POA: Insufficient documentation

## 2018-07-16 DIAGNOSIS — G8929 Other chronic pain: Secondary | ICD-10-CM

## 2018-07-16 MED ORDER — METHOCARBAMOL 500 MG PO TABS: 500.0000 mg | ORAL_TABLET | Freq: Two times a day (BID) | ORAL | 0 refills | Status: DC

## 2018-07-16 MED ORDER — GABAPENTIN 300 MG PO CAPS: 300.0000 mg | ORAL_CAPSULE | Freq: Two times a day (BID) | ORAL | 0 refills | Status: DC

## 2018-07-16 MED ORDER — PREDNISONE 10 MG (21) PO TBPK: ORAL_TABLET | ORAL | 0 refills | Status: DC

## 2018-07-16 NOTE — ED Provider Notes (Signed)
[facility] EMERGENCY DEPARTMENT Provider Note   CSN: [id number] Arrival date & time: 07/16/18  1302     History   Chief Complaint Chief Complaint  Patient presents with  . Back Pain    HPI Logan Brewer is a 63 y.o. male.  HPI   Logan Brewer is a 63 y.o. male, with a history of HTN, presenting to the ED with left lower back pain for the last several months.  Pain is sharp, left lower back, moderate to severe, radiating into the left lower leg. He was seen in December 2019 for the same complaint.  States the medications helped a little bit, but his pain returned.  It was recommended that he follow-up with neurosurgery, but he did not do so.  Denies fever/chills, weakness, numbness, changes in bowel or bladder function, abdominal pain, chest pain, shortness of breath, lower extremity edema, color change, or any other complaints.   Past Medical History:  Diagnosis Date  . Hypertension     Patient Active Problem List   Diagnosis Date Noted  . HTN (hypertension) 04/26/2013  . Leukocytosis, unspecified 04/26/2013  . Radiculopathy, lumbosacral region 04/25/2013  . Back pain 04/25/2013  . Anxiety 04/25/2013    Past Surgical History:  Procedure Laterality Date  . APPENDECTOMY          Home Medications    Prior to Admission medications   Medication Sig Start Date End Date Taking? Authorizing Provider  amLODipine (NORVASC) 5 MG tablet Take 1 tablet (5 mg total) by mouth daily. 06/12/18 07/12/18  Shaune Pollack, MD  Besifloxacin HCl 0.6 % SUSP Place 1 drop into the left eye 2 (two) times daily.    [provider]  Difluprednate (DUREZOL) 0.05 % EMUL Place 1 drop into the left eye 3 (three) times daily.    [provider]  fluorometholone (FML FORTE) 0.25 % ophthalmic suspension Place 1 drop into the left eye 4 (four) times daily. Patient not taking: Reported on 03/14/2017 02/12/17   Caccavale, Sophia, PA-C  gabapentin (NEURONTIN)  300 MG capsule Take 1 capsule (300 mg total) by mouth 2 (two) times daily for 15 days. 07/16/18 07/31/18  Joy, Shawn C, PA-C  hydrochlorothiazide (HYDRODIURIL) 25 MG tablet Take 1 tablet (25 mg total) by mouth daily. 11/13/17 01/12/18  Nira Conn, MD  lisinopril (PRINIVIL,ZESTRIL) 20 MG tablet Take 1 tablet (20 mg total) by mouth daily. 03/14/17   Maxwell Caul, PA-C  methocarbamol (ROBAXIN) 500 MG tablet Take 1 tablet (500 mg total) by mouth 2 (two) times daily. 07/16/18   Joy, Shawn C, PA-C  naproxen (NAPROSYN) 500 MG tablet Take 1 tablet (500 mg total) by mouth 2 (two) times daily. Patient not taking: Reported on 03/14/2017 12/01/15   Renne Crigler, PA-C  predniSONE (STERAPRED UNI-PAK 21 TAB) 10 MG (21) TBPK tablet Take 6 tabs by mouth daily  for 2 days, then 5 tabs for 2 days, then 4 tabs for 2 days, then 3 tabs for 2 days, 2 tabs for 2 days, then 1 tab by mouth daily for 2 days 07/16/18   Anselm Pancoast, PA-C    Family History History reviewed. No pertinent family history.  Social History Social History   Tobacco Use  . Smoking status: Current Every Day Smoker    Packs/day: 0.00    Types: Cigarettes  . Smokeless tobacco: Never Used  Substance Use Topics  . Alcohol use: No  . Drug use: Yes    Types: Marijuana  Comment: occasionally      Allergies   Patient has no known allergies.   Review of Systems Review of Systems  Constitutional: Negative for chills and fever.  Respiratory: Negative for shortness of breath.   Cardiovascular: Negative for chest pain.  Gastrointestinal: Negative for abdominal pain, nausea and vomiting.  Genitourinary: Negative for difficulty urinating, scrotal swelling and testicular pain.  Musculoskeletal: Positive for back pain.  Neurological: Negative for weakness and numbness.  All other systems reviewed and are negative.    Physical Exam Updated Vital Signs BP (!) 176/95   Pulse (!) 119   Temp 97.7 F (36.5 C) (Oral)   Resp 16    SpO2 100%   Physical Exam Vitals signs and nursing note reviewed.  Constitutional:      General: He is not in acute distress.    Appearance: He is well-developed. He is not diaphoretic.  HENT:     Head: Normocephalic and atraumatic.     Mouth/Throat:     Mouth: Mucous membranes are moist.     Pharynx: Oropharynx is clear.  Eyes:     Conjunctiva/sclera: Conjunctivae normal.  Neck:     Musculoskeletal: Neck supple.  Cardiovascular:     Rate and Rhythm: Normal rate and regular rhythm.     Pulses: Normal pulses.          Posterior tibial pulses are 2+ on the right side and 2+ on the left side.  Pulmonary:     Effort: Pulmonary effort is normal. No respiratory distress.  Abdominal:     Palpations: Abdomen is soft.     Tenderness: There is no abdominal tenderness. There is no guarding.  Musculoskeletal:     Right lower leg: No edema.     Left lower leg: No edema.     Comments: Tenderness to the left lumbar musculature and into the left buttocks. Normal motor function intact in all extremities. No midline spinal tenderness.   Lymphadenopathy:     Cervical: No cervical adenopathy.  Skin:    General: Skin is warm and dry.  Neurological:     Mental Status: He is alert.     Comments: Sensation grossly intact to light touch in the lower extremities bilaterally. No saddle anesthesias. Strength 5/5 in the bilateral lower extremities. No noted gait deficit.  Psychiatric:        Mood and Affect: Mood and affect normal.        Speech: Speech normal.        Behavior: Behavior normal.      ED Treatments / Results  Labs (all labs ordered are listed, but only abnormal results are displayed) Labs Reviewed - No data to display  EKG None  Radiology No results found.  Procedures Procedures (including critical care time)  Medications Ordered in ED Medications - No data to display   Initial Impression / Assessment and Plan / ED Course  I have reviewed the triage vital signs and  the nursing notes.  Pertinent labs & imaging results that were available during my care of the patient were reviewed by me and considered in my medical decision making (see chart for details).  Clinical Course as of Jul 17 1620  Sun Jul 16, 2018  1313 SPO2 monitor was showing an incorrect pulse rate and had poor waveform. I took the patient's pulse manually and found it to be 72 and regular. When SPO2 monitor showed good waveform, pulse noted to be 68 on monitor.  Pulse Rate(!): 119 [SJ]  Clinical Course User Index [SJ] Joy, Shawn C, PA-C    Patient presents with acute on chronic lower back pain with sciatica features.  No noted neurovascular deficit.  No red flag symptoms.  Reiterated outpatient follow-up.  The patient was given instructions for home care as well as return precautions. Patient voices understanding of these instructions, accepts the plan, and is comfortable with discharge.  Final Clinical Impressions(s) / ED Diagnoses   Final diagnoses:  Chronic left-sided low back pain with left-sided sciatica    ED Discharge Orders         Ordered    methocarbamol (ROBAXIN) 500 MG tablet  2 times daily     07/16/18 1349    predniSONE (STERAPRED UNI-PAK 21 TAB) 10 MG (21) TBPK tablet     07/16/18 1349    gabapentin (NEURONTIN) 300 MG capsule  2 times daily     07/16/18 1349           Anselm Pancoast, PA-C 07/17/18 1622    Margarita Grizzle, MD 07/20/18 1845

## 2018-07-16 NOTE — ED Triage Notes (Signed)
Onset 3 months ago lower back pain radiating down back of both legs.  L>R.  Was seen 2 weeks ago given meds but no relief.

## 2018-07-16 NOTE — Discharge Instructions (Addendum)
Take it easy, but do not lay around too much as this may make any stiffness worse.  Antiinflammatory medications: Take 600 mg of ibuprofen every 6 hours or 440 mg (over the counter dose) to 500 mg (prescription dose) of naproxen every 12 hours for the next 3 days. After this time, these medications may be used as needed for pain. Take these medications with food to avoid upset stomach. Choose only one of these medications, do not take them together. Acetaminophen (generic for Tylenol): Should you continue to have additional pain while taking the ibuprofen or naproxen, you may add in acetaminophen as needed. Your daily total maximum amount of acetaminophen from all sources should be limited to 4000mg /day for persons without liver problems, or 2000mg /day for those with liver problems. Gabapentin: As an alternative to the above regimen, may try the gabapentin.  Do not drive or perform any dangerous activities until you know how the gabapentin will affect you. Muscle relaxer: Robaxin is a muscle relaxer and may help loosen stiff muscles. Do not take the Robaxin while driving or performing other dangerous activities.  Prednisone: Take the prednisone, as prescribed, until finished. Lidocaine patches: These are available via either prescription or over-the-counter. The over-the-counter option may be more economical one and are likely just as effective. There are multiple over-the-counter brands, such as Salonpas. Exercises: Be sure to perform the attached exercises starting with three times a week and working up to performing them daily. This is an essential part of preventing long term problems.  Follow up: Follow up with a primary care provider or neurosurgeon for any future management of these complaints. Be sure to follow up within 7-10 days. Return: Return to the ED should symptoms worsen.  For prescription assistance, may try using prescription discount sites or apps, such as goodrx.com

## 2018-07-26 ENCOUNTER — Emergency Department (HOSPITAL_COMMUNITY)
Admission: EM | Admit: 2018-07-26 | Discharge: 2018-07-26 | Disposition: A | Discharge status: Home / Self Care | Payer: Self-pay | Attending: Emergency Medicine | Admitting: Emergency Medicine

## 2018-07-26 ENCOUNTER — Encounter (HOSPITAL_COMMUNITY): Payer: Self-pay | Admitting: Emergency Medicine

## 2018-07-26 DIAGNOSIS — N4889 Other specified disorders of penis: Secondary | ICD-10-CM | POA: Insufficient documentation

## 2018-07-26 DIAGNOSIS — Z711 Person with feared health complaint in whom no diagnosis is made: Secondary | ICD-10-CM

## 2018-07-26 DIAGNOSIS — I10 Essential (primary) hypertension: Secondary | ICD-10-CM | POA: Insufficient documentation

## 2018-07-26 DIAGNOSIS — Z79899 Other long term (current) drug therapy: Secondary | ICD-10-CM | POA: Insufficient documentation

## 2018-07-26 DIAGNOSIS — F17210 Nicotine dependence, cigarettes, uncomplicated: Secondary | ICD-10-CM | POA: Insufficient documentation

## 2018-07-26 DIAGNOSIS — Z113 Encounter for screening for infections with a predominantly sexual mode of transmission: Secondary | ICD-10-CM | POA: Insufficient documentation

## 2018-07-26 LAB — URINALYSIS, ROUTINE W REFLEX MICROSCOPIC
Bacteria, UA: NONE SEEN
Bilirubin Urine: NEGATIVE
GLUCOSE, UA: NEGATIVE mg/dL
Ketones, ur: NEGATIVE mg/dL
Leukocytes, UA: NEGATIVE
NITRITE: NEGATIVE
Protein, ur: NEGATIVE mg/dL
SPECIFIC GRAVITY, URINE: 1.029 (ref 1.005–1.030)
pH: 5 (ref 5.0–8.0)

## 2018-07-26 MED ORDER — AMLODIPINE BESYLATE 5 MG PO TABS: 5.0000 mg | ORAL_TABLET | Freq: Every day | ORAL | 0 refills | Status: DC

## 2018-07-26 MED ORDER — AZITHROMYCIN 250 MG PO TABS
1000.0000 mg | ORAL_TABLET | Freq: Once | ORAL | Status: AC
  Administered 2018-07-26: 1000 mg via ORAL
  Filled 2018-07-26: qty 4

## 2018-07-26 MED ORDER — CEFTRIAXONE SODIUM 250 MG IJ SOLR
250.0000 mg | Freq: Once | INTRAMUSCULAR | Status: AC
  Administered 2018-07-26: 250 mg via INTRAMUSCULAR
  Filled 2018-07-26: qty 250

## 2018-07-26 NOTE — ED Triage Notes (Signed)
Pt here requesting STD check. Pt states "he has been peeing a lot" and "just wants to make sure"

## 2018-07-26 NOTE — ED Provider Notes (Addendum)
[facility] EMERGENCY DEPARTMENT Provider Note   CSN: [id number] Arrival date & time: 07/26/18  1926     History   Chief Complaint Chief Complaint  Patient presents with  . Exposure to STD    HPI Grimm Bassani is a 63 y.o. male with history of hypertension, tobacco abuse, and anxiety who presents to the emergency department requesting STD check.  Patient states that he recently had unprotected sex with a new male partner and is concerned he may have been exposed to an STD.  He states he is concerned because he has some burning/tingling sensation to the distal aspect of the penis.  He states this is not necessarily specific to urination.  No specific alleviating or aggravating factors.  No intervention prior to arrival.  States is been happening for a few days.  He denies fever, chills, nausea, vomiting, penile discharge, testicular pain, testicular swelling, pain with bowel movements, dysuria, urinary frequency (despite triage nurse note), or urgency.  This recent new sexual partner did not inform him of any known STDs.  HPI  Past Medical History:  Diagnosis Date  . Hypertension     Patient Active Problem List   Diagnosis Date Noted  . HTN (hypertension) 04/26/2013  . Leukocytosis, unspecified 04/26/2013  . Radiculopathy, lumbosacral region 04/25/2013  . Back pain 04/25/2013  . Anxiety 04/25/2013    Past Surgical History:  Procedure Laterality Date  . APPENDECTOMY          Home Medications    Prior to Admission medications   Medication Sig Start Date End Date Taking? Authorizing Provider  amLODipine (NORVASC) 5 MG tablet Take 1 tablet (5 mg total) by mouth daily. 06/12/18 07/12/18  Shaune Pollack, MD  Besifloxacin HCl 0.6 % SUSP Place 1 drop into the left eye 2 (two) times daily.    [provider]  Difluprednate (DUREZOL) 0.05 % EMUL Place 1 drop into the left eye 3 (three) times daily.    [provider]  fluorometholone (FML  FORTE) 0.25 % ophthalmic suspension Place 1 drop into the left eye 4 (four) times daily. Patient not taking: Reported on 03/14/2017 02/12/17   Caccavale, Sophia, PA-C  gabapentin (NEURONTIN) 300 MG capsule Take 1 capsule (300 mg total) by mouth 2 (two) times daily for 15 days. 07/16/18 07/31/18  Joy, Shawn C, PA-C  hydrochlorothiazide (HYDRODIURIL) 25 MG tablet Take 1 tablet (25 mg total) by mouth daily. 11/13/17 01/12/18  Nira Conn, MD  lisinopril (PRINIVIL,ZESTRIL) 20 MG tablet Take 1 tablet (20 mg total) by mouth daily. 03/14/17   Maxwell Caul, PA-C  methocarbamol (ROBAXIN) 500 MG tablet Take 1 tablet (500 mg total) by mouth 2 (two) times daily. 07/16/18   Joy, Shawn C, PA-C  naproxen (NAPROSYN) 500 MG tablet Take 1 tablet (500 mg total) by mouth 2 (two) times daily. Patient not taking: Reported on 03/14/2017 12/01/15   Renne Crigler, PA-C  predniSONE (STERAPRED UNI-PAK 21 TAB) 10 MG (21) TBPK tablet Take 6 tabs by mouth daily  for 2 days, then 5 tabs for 2 days, then 4 tabs for 2 days, then 3 tabs for 2 days, 2 tabs for 2 days, then 1 tab by mouth daily for 2 days 07/16/18   Anselm Pancoast, PA-C    Family History No family history on file.  Social History Social History   Tobacco Use  . Smoking status: Current Every Day Smoker    Packs/day: 0.00    Types: Cigarettes  .  Smokeless tobacco: Never Used  Substance Use Topics  . Alcohol use: No  . Drug use: Yes    Types: Marijuana    Comment: occasionally      Allergies   Patient has no known allergies.   Review of Systems Review of Systems  Constitutional: Negative for chills and fever.  Gastrointestinal: Negative for abdominal pain, blood in stool, nausea, rectal pain and vomiting.  Genitourinary: Positive for penile pain. Negative for discharge, dysuria, hematuria, scrotal swelling, testicular pain and urgency.     Physical Exam Updated Vital Signs BP (!) 193/82 (BP Location: Left Arm)   Pulse 97   Temp 98.7 F  (37.1 C) (Oral)   Resp 18   SpO2 99%   Physical Exam Vitals signs and nursing note reviewed. Exam conducted with a chaperone present.  Constitutional:      General: He is not in acute distress.    Appearance: He is well-developed.  HENT:     Head: Normocephalic and atraumatic.  Eyes:     General:        Right eye: No discharge.        Left eye: No discharge.     Extraocular Movements: Extraocular movements intact.     Conjunctiva/sclera: Conjunctivae normal.     Pupils: Pupils are equal, round, and reactive to light.  Cardiovascular:     Rate and Rhythm: Normal rate and regular rhythm.  Pulmonary:     Effort: Pulmonary effort is normal.     Breath sounds: Normal breath sounds.  Abdominal:     General: There is no distension.     Palpations: Abdomen is soft.     Tenderness: There is no abdominal tenderness.  Genitourinary:    Penis: Normal and circumcised. No erythema, tenderness, discharge, swelling or lesions.      Scrotum/Testes: Normal.        Right: Mass, tenderness or swelling not present.        Left: Mass, tenderness or swelling not present.  Neurological:     General: No focal deficit present.     Mental Status: He is alert.     Comments: Clear speech.   Psychiatric:        Behavior: Behavior normal.        Thought Content: Thought content normal.      ED Treatments / Results  Labs (all labs ordered are listed, but only abnormal results are displayed) Labs Reviewed  URINALYSIS, ROUTINE W REFLEX MICROSCOPIC - Abnormal; Notable for the following components:      Result Value   Hgb urine dipstick SMALL (*)    All other components within normal limits  RPR  HIV ANTIBODY (ROUTINE TESTING W REFLEX)  GC/CHLAMYDIA PROBE AMP ([place]) NOT AT [facility]    EKG None  Radiology No results found.  Procedures Procedures (including critical care time)  Medications Ordered in ED Medications  cefTRIAXone (ROCEPHIN) injection 250 mg (has no administration in  time range)  azithromycin (ZITHROMAX) tablet 1,000 mg (has no administration in time range)     Initial Impression / Assessment and Plan / ED Course  I have reviewed the triage vital signs and the nursing notes.  Pertinent labs & imaging results that were available during my care of the patient were reviewed by me and considered in my medical decision making (see chart for details).   Patient presents to the emergency department requesting STD testing as he recently had intercourse with a new male partner without protection.  He has had some tingling/burning to the distal aspect of his penis.  He has a completely benign physical exam.  There is no lesions/rashes/wounds to cause discomfort that he describes.  No history or physical exam components to raise concern for epididymitis, orchitis, or prostatitis.  UA obtained- No UTI, small amount of hgb- PCP recheck.  Gonorrhea, chlamydia, HIV, and syphilis testing was obtained and is pending.  Patient elected for gonorrhea/chlamydia prophylaxis, Rocephin and azithromycin given in the emergency department.  He is aware that he has test results pending, he is aware if positive he will need to go to the health department and inform all sexual partners.  We discussed the importance of protection when sexually active.  Additionally patient's blood pressure noted to be elevated in the ER, consistent with prior ER visits, no symptoms to suggest hypertensive emergency. He ran out of his Amlodipine a few days ago and is requesting refill which was provided. We discussed the need for PCP recheck/follow up.  I discussed results, treatment plan, need for follow-up, and return precautions with the patient. Provided opportunity for questions, patient confirmed understanding and is in agreement with plan.   Final Clinical Impressions(s) / ED Diagnoses   Final diagnoses:  Concern about STD in male without diagnosis  Hypertension, unspecified type    ED Discharge  Orders         Ordered    amLODipine (NORVASC) 5 MG tablet  Daily     07/26/18 2122           Cherly Andersonetrucelli, Chrisotpher Rivero R, PA-C 07/26/18 2124    Cherly Andersonetrucelli, Zachari Alberta R, PA-C 07/26/18 2124    Benjiman CorePickering, Nathan, MD 07/26/18 2332

## 2018-07-26 NOTE — Discharge Instructions (Signed)
You were seen in the ER today for an STD check.  We treated you for possible gonorrhea and chlamydia.  We will call you if your gonorrhea, chlamydia, HIV, or syphilis test are positive, if positive you will need to inform all sexual partners and seek care at the health department.  Your urine note a small amount blood, please have this rechecked by primary care provider, your blood pressure was also elevated, we have refilled your blood pressure medicine, take this as prescribed, follow-up with primary care within 1 week for recheck of your urine as well as a recheck of your blood pressure.  Return to the ER for new or worsening symptoms or any other concerns.

## 2018-07-27 LAB — RPR: RPR Ser Ql: NONREACTIVE

## 2018-07-27 LAB — HIV ANTIBODY (ROUTINE TESTING W REFLEX): HIV Screen 4th Generation wRfx: NONREACTIVE

## 2018-08-31 ENCOUNTER — Other Ambulatory Visit: Payer: Self-pay

## 2018-08-31 ENCOUNTER — Encounter (HOSPITAL_COMMUNITY): Payer: Self-pay | Admitting: Emergency Medicine

## 2018-08-31 ENCOUNTER — Emergency Department (HOSPITAL_COMMUNITY)
Admission: EM | Admit: 2018-08-31 | Discharge: 2018-08-31 | Disposition: A | Discharge status: Home / Self Care | Payer: Self-pay | Attending: Emergency Medicine | Admitting: Emergency Medicine

## 2018-08-31 DIAGNOSIS — M791 Myalgia, unspecified site: Secondary | ICD-10-CM | POA: Insufficient documentation

## 2018-08-31 DIAGNOSIS — Z202 Contact with and (suspected) exposure to infections with a predominantly sexual mode of transmission: Secondary | ICD-10-CM | POA: Insufficient documentation

## 2018-08-31 LAB — BASIC METABOLIC PANEL
ANION GAP: 5 (ref 5–15)
BUN: 15 mg/dL (ref 6–20)
CO2: 23 mmol/L (ref 22–32)
CREATININE: 1.21 mg/dL (ref 0.61–1.24)
Calcium: 8.6 mg/dL — ABNORMAL LOW (ref 8.9–10.3)
Chloride: 106 mmol/L (ref 98–111)
GFR calc Af Amer: >60 mL/min (ref >60)
Glucose, Bld: 100 mg/dL — ABNORMAL HIGH (ref 70–99)
Potassium: 3.6 mmol/L (ref 3.5–5.1)
SODIUM: 134 mmol/L — AB (ref 135–145)

## 2018-08-31 LAB — CBC
HCT: 40.3 % (ref 39.0–52.0)
Hemoglobin: 13.6 g/dL (ref 13.0–17.0)
MCH: 32.5 pg (ref 26.0–34.0)
MCHC: 33.7 g/dL (ref 30.0–36.0)
MCV: 96.2 fL (ref 80.0–100.0)
PLATELETS: 264 10*3/uL (ref 150–400)
RBC: 4.19 MIL/uL — AB (ref 4.22–5.81)
RDW: 12.3 % (ref 11.5–15.5)
WBC: 6.5 10*3/uL (ref 4.0–10.5)
nRBC: 0 % (ref 0.0–0.2)

## 2018-08-31 LAB — CK: Total CK: 151 U/L (ref 49–397)

## 2018-08-31 MED ORDER — CEFTRIAXONE SODIUM 250 MG IJ SOLR
250.0000 mg | Freq: Once | INTRAMUSCULAR | Status: AC
  Administered 2018-08-31: 250 mg via INTRAMUSCULAR
  Filled 2018-08-31: qty 250

## 2018-08-31 MED ORDER — AZITHROMYCIN 250 MG PO TABS
1000.0000 mg | ORAL_TABLET | Freq: Once | ORAL | Status: AC
  Administered 2018-08-31: 1000 mg via ORAL
  Filled 2018-08-31: qty 4

## 2018-08-31 NOTE — ED Notes (Signed)
Discharge instructions discussed with Pt. Pt verbalized understanding. Pt stable and ambulatory.    

## 2018-08-31 NOTE — ED Provider Notes (Signed)
[facility] Emergency Department Provider Note MRN:  [id number]  Arrival date & time: 08/31/18     Chief Complaint   Muscle Pain and Exposure to STD   History of Present Illness   Logan Brewer is a 63 y.o. year-old male with a history of hypertension presenting to the ED with chief complaint of muscle pain and STD exposure.  Patient has been experiencing continued 2 months of diffuse muscle pains.  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  Had unprotected sex about 2 weeks ago, concerned that he has been exposed to an STD.  Denies dysuria, no penile discharge, no pain or tenderness to the testes, no rash, no fever, no chest pain or shortness of breath, no abdominal pain.  The muscle pain is located in the arms and legs diffusely.  Denies joint pain.  Pain is constant, no exacerbating relieving factors.  Was supposed to follow-up with a PCP but did not have the time.  Review of Systems  A complete 10 system review of systems was obtained and all systems are negative except as noted in the HPI and PMH.   Patient's Health History    Past Medical History:  Diagnosis Date  . Hypertension     Past Surgical History:  Procedure Laterality Date  . APPENDECTOMY      No family history on file.  Social History   Socioeconomic History  . Marital status: Single    Spouse name: Not on file  . Number of children: Not on file  . Years of education: Not on file  . Highest education level: Not on file  Occupational History  . Not on file  Social Needs  . Financial resource strain: Not on file  . Food insecurity:    Worry: Not on file    Inability: Not on file  . Transportation needs:    Medical: Not on file    Non-medical: Not on file  Tobacco Use  . Smoking status: Current Every Day Smoker    Packs/day: 0.00    Types: Cigarettes  . Smokeless tobacco: Never Used  Substance and Sexual Activity   . Alcohol use: No  . Drug use: Yes    Types: Marijuana    Comment: occasionally   . Sexual activity: Not on file  Lifestyle  . Physical activity:    Days per week: Not on file    Minutes per session: Not on file  . Stress: Not on file  Relationships  . Social connections:    Talks on phone: Not on file    Gets together: Not on file    Attends religious service: Not on file    Active member of club or organization: Not on file    Attends meetings of clubs or organizations: Not on file    Relationship status: Not on file  . Intimate partner violence:    Fear of current or ex partner: Not on file    Emotionally abused: Not on file    Physically abused: Not on file    Forced sexual activity: Not on file  Other Topics Concern  . Not on file  Social History Narrative  . Not on file     Physical Exam  Vital Signs and Nursing Notes reviewed Vitals:   08/31/18 1612  BP: (!) 161/83  Pulse: 88  Resp: 16  Temp: 99.1 F (37.3 C)  SpO2: 98%  CONSTITUTIONAL: Well-appearing, NAD NEURO:  Alert and oriented x 3, no focal deficits EYES:  eyes equal and reactive ENT/NECK:  no LAD, no JVD CARDIO: Regular rate, well-perfused, normal S1 and S2 PULM:  CTAB no wheezing or rhonchi GI/GU:  normal bowel sounds, non-distended, non-tender MSK/SPINE:  No gross deformities, no edema, minimal tenderness to palpation to the bilateral calves and triceps SKIN:  no rash, atraumatic PSYCH:  Appropriate speech and behavior  Diagnostic and Interventional Summary    Labs Reviewed  CBC - Abnormal; Notable for the following components:      Result Value   RBC 4.19 (*)    All other components within normal limits  BASIC METABOLIC PANEL - Abnormal; Notable for the following components:   Sodium 134 (*)    Glucose, Bld 100 (*)    Calcium 8.6 (*)    All other components within normal limits  CK  GC/CHLAMYDIA PROBE AMP ([place]) NOT AT [facility]    No orders to display    Medications   cefTRIAXone (ROCEPHIN) injection 250 mg (250 mg Intramuscular Given 08/31/18 1830)  azithromycin (ZITHROMAX) tablet 1,000 mg (1,000 mg Oral Given 08/31/18 1836)     Procedures Critical Care  ED Course and Medical Decision Making  I have reviewed the triage vital signs and the nursing notes.  Pertinent labs & imaging results that were available during my care of the patient were reviewed by me and considered in my medical decision making (see below for details).  Empiric treatment for STD exposure, will send urine for GC.  Unclear source of myalgias, will obtain labs to screen for rhabdo.  We will continue to encourage PCP follow-up.  Labs are normal, appropriate for discharge.  After the discussed management above, the patient was determined to be safe for discharge.  The patient was in agreement with this plan and all questions regarding their care were answered.  ED return precautions were discussed and the patient will return to the ED with any significant worsening of condition.  Elmer Sow. Pilar Plate, MD [facility] Emergency Medicine [facility] [email or web address]  Final Clinical Impressions(s) / ED Diagnoses     ICD-10-CM   1. Muscle pain M79.10   2. Exposure to STD Z20.2     ED Discharge Orders    None         Sabas Sous, MD 08/31/18 [id number]

## 2018-08-31 NOTE — Discharge Instructions (Addendum)
You were evaluated in the Emergency Department and after careful evaluation, we did not find any emergent condition requiring admission or further testing in the hospital.  Your blood work today was normal.  Your symptoms today seem to be due to soreness related to exertional activity.  Please try to rest your muscles and use Tylenol or ibuprofen for discomfort.  We encourage you to follow-up with a primary care doctor.  Please return to the Emergency Department if you experience any worsening of your condition.  We encourage you to follow up with a primary care provider.  Thank you for allowing [place] to be a part of your care.

## 2018-08-31 NOTE — ED Triage Notes (Signed)
Pt in POV with bilateral leg muscle pain, as well as L shoulder pain x 2 mo's. Also asking for STD screening as he recently had unprotected sex

## 2018-10-19 ENCOUNTER — Emergency Department (HOSPITAL_COMMUNITY)
Admission: EM | Admit: 2018-10-19 | Discharge: 2018-10-19 | Disposition: A | Discharge status: Home / Self Care | Payer: Self-pay | Attending: Emergency Medicine | Admitting: Emergency Medicine

## 2018-10-19 ENCOUNTER — Encounter (HOSPITAL_COMMUNITY): Payer: Self-pay

## 2018-10-19 ENCOUNTER — Other Ambulatory Visit: Payer: Self-pay

## 2018-10-19 ENCOUNTER — Emergency Department (HOSPITAL_COMMUNITY): Payer: Self-pay

## 2018-10-19 DIAGNOSIS — M79605 Pain in left leg: Secondary | ICD-10-CM | POA: Insufficient documentation

## 2018-10-19 DIAGNOSIS — Z79899 Other long term (current) drug therapy: Secondary | ICD-10-CM | POA: Insufficient documentation

## 2018-10-19 DIAGNOSIS — Z202 Contact with and (suspected) exposure to infections with a predominantly sexual mode of transmission: Secondary | ICD-10-CM | POA: Insufficient documentation

## 2018-10-19 DIAGNOSIS — F17210 Nicotine dependence, cigarettes, uncomplicated: Secondary | ICD-10-CM | POA: Insufficient documentation

## 2018-10-19 DIAGNOSIS — G8929 Other chronic pain: Secondary | ICD-10-CM | POA: Insufficient documentation

## 2018-10-19 DIAGNOSIS — M79604 Pain in right leg: Secondary | ICD-10-CM | POA: Insufficient documentation

## 2018-10-19 DIAGNOSIS — M25512 Pain in left shoulder: Secondary | ICD-10-CM | POA: Insufficient documentation

## 2018-10-19 DIAGNOSIS — I10 Essential (primary) hypertension: Secondary | ICD-10-CM | POA: Insufficient documentation

## 2018-10-19 LAB — POCT I-STAT EG7
Bicarbonate: 25 mmol/L (ref 20.0–28.0)
Calcium, Ion: 1.2 mmol/L (ref 1.15–1.40)
HCT: 44 % (ref 39.0–52.0)
Hemoglobin: 15 g/dL (ref 13.0–17.0)
O2 Saturation: 72 %
Potassium: 4.4 mmol/L (ref 3.5–5.1)
Sodium: 139 mmol/L (ref 135–145)
TCO2: 26 mmol/L (ref 22–32)
pCO2, Ven: 42 mmHg — ABNORMAL LOW (ref 44.0–60.0)
pH, Ven: 7.383 (ref 7.250–7.430)
pO2, Ven: 39 mmHg (ref 32.0–45.0)

## 2018-10-19 LAB — URINALYSIS, ROUTINE W REFLEX MICROSCOPIC
Bilirubin Urine: NEGATIVE
Glucose, UA: NEGATIVE mg/dL
Hgb urine dipstick: NEGATIVE
Ketones, ur: NEGATIVE mg/dL
Leukocytes,Ua: NEGATIVE
Nitrite: NEGATIVE
Protein, ur: NEGATIVE mg/dL
Specific Gravity, Urine: 1.015 (ref 1.005–1.030)
pH: 5 (ref 5.0–8.0)

## 2018-10-19 MED ORDER — CEFTRIAXONE SODIUM 250 MG IJ SOLR
250.0000 mg | Freq: Once | INTRAMUSCULAR | Status: AC
  Administered 2018-10-19: 250 mg via INTRAMUSCULAR
  Filled 2018-10-19: qty 250

## 2018-10-19 MED ORDER — AMLODIPINE BESYLATE 5 MG PO TABS
5.0000 mg | ORAL_TABLET | Freq: Once | ORAL | Status: AC
  Administered 2018-10-19: 13:00:00 5 mg via ORAL
  Filled 2018-10-19: qty 1

## 2018-10-19 MED ORDER — AZITHROMYCIN 250 MG PO TABS
1000.0000 mg | ORAL_TABLET | Freq: Once | ORAL | Status: AC
  Administered 2018-10-19: 13:00:00 1000 mg via ORAL
  Filled 2018-10-19: qty 4

## 2018-10-19 MED ORDER — HYDROCHLOROTHIAZIDE 25 MG PO TABS: 25.0000 mg | ORAL_TABLET | Freq: Every day | ORAL | 1 refills | Status: DC

## 2018-10-19 MED ORDER — HYDROCHLOROTHIAZIDE 25 MG PO TABS
25.0000 mg | ORAL_TABLET | Freq: Once | ORAL | Status: AC
  Administered 2018-10-19: 25 mg via ORAL
  Filled 2018-10-19: qty 1

## 2018-10-19 MED ORDER — AMLODIPINE BESYLATE 5 MG PO TABS: 5.0000 mg | ORAL_TABLET | Freq: Every day | ORAL | 0 refills | Status: DC

## 2018-10-19 MED ORDER — STERILE WATER FOR INJECTION IJ SOLN
INTRAMUSCULAR | Status: AC
  Filled 2018-10-19: qty 10

## 2018-10-19 MED ORDER — LIDOCAINE HCL (PF) 1 % IJ SOLN: 0.9000 mL | Freq: Once | INTRAMUSCULAR | Status: DC

## 2018-10-19 NOTE — ED Triage Notes (Signed)
Pt reports he needs an STD check d/t having unprotected sex. He also reports bilateral foot pain and left shoulder pain for a while. AOX4, noted to be hypertensive in triage.

## 2018-10-19 NOTE — Discharge Instructions (Signed)
Please see the information and instructions below regarding your visit.  Your diagnoses today include:  1. Exposure to STD   2. Chronic left shoulder pain   3. Bilateral leg pain   4. Essential hypertension     Tests performed today include: See side panel of your discharge paperwork for testing performed today. Vital signs are listed at the bottom of these instructions.   We tested for HIV, gonorrhea, chlamydia and syphilis today.  These results will be available in 48 to 72 hours.  You can find them on your MyChart, or you will receive a call for any positive results.  You will not receive a call for any negative results.  Even if your gonorrhea or chlamydia testing is positive, you have been treated today.  Please do not have any unprotected sexual intercourse for 7 days.  Please use a condom with all sexual encounters.  Medications prescribed:    Take any prescribed medications only as prescribed, and any over the counter medications only as directed on the packaging.  Please restart your hydrochlorothiazide and amlodipine.  I recommend taking 1 of them in the morning and 1 of them in the evening.  For future needs for your pain, I recommend taking Tylenol 650 mg every 6 hours as needed for pain.  Home care instructions:  Please follow any educational materials contained in this packet.   For the pain in your shins, I recommend applying ice after the end of a long day.  Please place a towel in between the ice and your skin to prevent superficial nerve damage.  Follow-up instructions: We made an appointment for you on May 18 at the [facility] to follow-up for your blood pressure.  Return instructions:  Please return to the Emergency Department if you experience worsening symptoms.  Please return to the emergency department if you develop any testicular pain, worsening pain with urination, fevers, abdominal pain, nausea, or vomiting. Please return for  any chest pain, shortness of breath, headaches, vision changes, or weakness or numbness in extremities. Please return if you have any other emergent concerns.  Additional Information:   Your vital signs today were: BP (!) 165/84 (BP Location: Right Arm)    Pulse 82    Temp 98.1 F (36.7 C) (Oral)    Resp 18    SpO2 100%  If your blood pressure (BP) was elevated on multiple readings during this visit above 130 for the top number or above 80 for the bottom number, please have this repeated by your primary care provider within one month. --------------  Thank you for allowing [place] to participate in your care today.

## 2018-10-19 NOTE — TOC Transition Note (Signed)
Transition of Care [facility]) - CM/SW Discharge Note   Patient Details  Name: Logan Brewer MRN: [id number] Date of Birth: 1955/12/09  [facility] CM/SW Contact:  Oletta Cohn, RN Phone Number: 10/19/2018, 1:52 PM   Clinical Narrative:     [name]. Lucretia Roers, RN, BSN, [place] [phone]  [facility] set up appointment with [facility] on May 18 @ 1015.  Spoke with pt at bedside and advised to please arrive 15 min early and take a picture ID and your current medications.  Pt verbalizes understanding of keeping appointment.     Barriers to Discharge: Continued Medical Work up   Patient Goals and CMS Choice        Discharge Placement               home with self care        Discharge Plan and Services   Discharge Planning Services: CM Consult, Follow-up appt scheduled                                 Social Determinants of Health (SDOH) Interventions     Readmission Risk Interventions No flowsheet data found.

## 2018-10-19 NOTE — ED Provider Notes (Signed)
[facility] EMERGENCY DEPARTMENT Provider Note   CSN: [id number] Arrival date & time: 10/19/18  1133    History   Chief Complaint Chief Complaint  Patient presents with  . Exposure to STD  . Foot Pain    HPI Logan Brewer is a 63 y.o. male.     HPI  Patient is a 63 year old male with past medical history of hypertension presenting for exposure to STI, bilateral pain in his shins, and left shoulder pain.  He also notes that his blood pressures been elevated since he ran out of his blood pressure medication a couple days ago.  Patient reports that he had unprotected sex intercourse with a new partner within the last couple weeks, and he has had some discomfort and burning with urination.  He denies any penile discharge, testicular pain or swelling, abdominal pain or rectal pain.  Patient notes that for the past 2 months he has had pain in bilateral shins.  He reports that it primarily hurts him after the end of a long day working in [employer].  He has not taken any over-the-counter remedies for this.  He denies any pain in calves or knees.  Denies any discoloration of the lower extremities.  Patient reports that he has had some chronic left shoulder pain over the past several months as well.  He denies any injury.  He reports that it aches in the anterior shoulder.  Denies any numbness, tingling, or weakness of the left upper extremity.  Blood pressure acutely elevated today with initial systolic reading of 216.  He denies any chest pain, shortness of breath, headache, or blurred vision.   Past Medical History:  Diagnosis Date  . Hypertension     Patient Active Problem List   Diagnosis Date Noted  . HTN (hypertension) 04/26/2013  . Leukocytosis, unspecified 04/26/2013  . Radiculopathy, lumbosacral region 04/25/2013  . Back pain 04/25/2013  . Anxiety 04/25/2013    Past Surgical History:  Procedure Laterality Date  . APPENDECTOMY          Home  Medications    Prior to Admission medications   Medication Sig Start Date End Date Taking? Authorizing Provider  amLODipine (NORVASC) 5 MG tablet Take 1 tablet (5 mg total) by mouth daily for 30 days. 07/26/18 08/25/18  Petrucelli, Pleas Koch, PA-C  Besifloxacin HCl 0.6 % SUSP Place 1 drop into the left eye 2 (two) times daily.    [provider]  Difluprednate (DUREZOL) 0.05 % EMUL Place 1 drop into the left eye 3 (three) times daily.    [provider]  gabapentin (NEURONTIN) 300 MG capsule Take 1 capsule (300 mg total) by mouth 2 (two) times daily for 15 days. 07/16/18 07/31/18  Joy, Shawn C, PA-C  hydrochlorothiazide (HYDRODIURIL) 25 MG tablet Take 1 tablet (25 mg total) by mouth daily. 11/13/17 01/12/18  Nira Conn, MD  lisinopril (PRINIVIL,ZESTRIL) 20 MG tablet Take 1 tablet (20 mg total) by mouth daily. 03/14/17   Maxwell Caul, PA-C  methocarbamol (ROBAXIN) 500 MG tablet Take 1 tablet (500 mg total) by mouth 2 (two) times daily. 07/16/18   Joy, Shawn C, PA-C  predniSONE (STERAPRED UNI-PAK 21 TAB) 10 MG (21) TBPK tablet Take 6 tabs by mouth daily  for 2 days, then 5 tabs for 2 days, then 4 tabs for 2 days, then 3 tabs for 2 days, 2 tabs for 2 days, then 1 tab by mouth daily for 2 days 07/16/18   Harolyn Rutherford  C, PA-C    Family History History reviewed. No pertinent family history.  Social History Social History   Tobacco Use  . Smoking status: Current Every Day Smoker    Packs/day: 0.00    Types: Cigarettes  . Smokeless tobacco: Never Used  Substance Use Topics  . Alcohol use: No  . Drug use: Yes    Types: Marijuana    Comment: occasionally      Allergies   Patient has no known allergies.   Review of Systems Review of Systems  Constitutional: Negative for chills and fever.  Respiratory: Negative for chest tightness and shortness of breath.   Cardiovascular: Negative for chest pain.  Gastrointestinal: Negative for nausea and vomiting.   Genitourinary: Positive for dysuria. Negative for discharge, penile pain, penile swelling and testicular pain.  Skin: Negative for color change and wound.  Neurological: Negative for weakness, numbness and headaches.     Physical Exam Updated Vital Signs BP (!) 165/84 (BP Location: Right Arm)   Pulse 82   Temp 98.1 F (36.7 C) (Oral)   Resp 18   SpO2 100%   Physical Exam Vitals signs and nursing note reviewed.  Constitutional:      General: He is not in acute distress.    Appearance: He is well-developed.  HENT:     Head: Normocephalic and atraumatic.  Eyes:     Conjunctiva/sclera: Conjunctivae normal.     Pupils: Pupils are equal, round, and reactive to light.  Neck:     Musculoskeletal: Normal range of motion and neck supple.  Cardiovascular:     Rate and Rhythm: Normal rate and regular rhythm.     Heart sounds: S1 normal and S2 normal. No murmur.  Pulmonary:     Effort: Pulmonary effort is normal.     Breath sounds: Normal breath sounds. No wheezing or rales.  Abdominal:     General: There is no distension.     Palpations: Abdomen is soft.     Tenderness: There is no abdominal tenderness. There is no guarding.  Genitourinary:    Comments: Deferred.  Musculoskeletal: Normal range of motion.        General: No deformity or signs of injury.     Right lower leg: No edema.     Left lower leg: No edema.     Comments: Patient has tenderness to palpation along bilateral medial tibias.  Normal and symmetric gait. Left shoulder with tenderness to palpation as depicted in image. Pain is overlying palpation of AC joint. Normal ROM. Negative empty can test, Negative Neer's. No swelling, erythema or ecchymosis present. No step-off, crepitus, or deformity appreciated. 5/5 muscle strength of LUE. 2+ radial pulse, sensation intact and all compartments soft.   Lymphadenopathy:     Cervical: No cervical adenopathy.  Skin:    General: Skin is warm and dry.     Findings: No erythema  or rash.  Neurological:     Mental Status: He is alert.     Comments: Cranial nerves grossly intact. Patient moves extremities symmetrically and with good coordination.  Psychiatric:        Behavior: Behavior normal.        Thought Content: Thought content normal.        Judgment: Judgment normal.      ED Treatments / Results  Labs (all labs ordered are listed, but only abnormal results are displayed) Labs Reviewed  POCT I-STAT EG7 - Abnormal; Notable for the following components:  Result Value   pCO2, Ven 42.0 (*)    All other components within normal limits  URINALYSIS, ROUTINE W REFLEX MICROSCOPIC  RPR  HIV ANTIBODY (ROUTINE TESTING W REFLEX)  GC/CHLAMYDIA PROBE AMP ([place]) NOT AT [facility]    EKG None  Radiology Dg Shoulder Left  Result Date: 10/19/2018 CLINICAL DATA:  Shoulder pain.  No injury. EXAM: LEFT SHOULDER - 2+ VIEW COMPARISON:  None. FINDINGS: No acute fracture or dislocation. Mild acromioclavicular osteoarthritis. The glenohumeral joint space is relatively preserved. Tiny inferior glenoid osteophyte. Bone mineralization is normal. Soft tissues are unremarkable. IMPRESSION: 1.  No acute osseous abnormality. 2. Mild acromioclavicular osteoarthritis. Electronically Signed   By: Obie Dredge M.D.   On: 10/19/2018 13:00    Procedures Procedures (including critical care time)  Medications Ordered in ED Medications  lidocaine (PF) (XYLOCAINE) 1 % injection 0.9 mL (0.9 mLs Intradermal Not Given 10/19/18 1443)  sterile water (preservative free) injection (has no administration in time range)  cefTRIAXone (ROCEPHIN) injection 250 mg (250 mg Intramuscular Given 10/19/18 1310)  azithromycin (ZITHROMAX) tablet 1,000 mg (1,000 mg Oral Given 10/19/18 1308)  amLODipine (NORVASC) tablet 5 mg (5 mg Oral Given 10/19/18 1309)  hydrochlorothiazide (HYDRODIURIL) tablet 25 mg (25 mg Oral Given 10/19/18 1309)     Initial Impression / Assessment and Plan / ED Course  I  have reviewed the triage vital signs and the nursing notes.  Pertinent labs & imaging results that were available during my care of the patient were reviewed by me and considered in my medical decision making (see chart for details).  Clinical Course as of Oct 18 1441  Thu Oct 19, 2018  1437 Improvement after home antihypertensives.   BP(!): 165/84 [AM]  1438 Urinalysis, Routine w reflex microscopic [AM]    Clinical Course User Index [AM] Elisha Ponder, PA-C       Patient is nontoxic-appearing and in no acute distress, however he is significantly hypertensive on arrival with systolic blood pressure 216.  He is without any chest pain, shortness of breath, headaches, dizziness or lightheadedness to suggest hypertensive urgency or emergency.  He has been without his antihypertensives for a few days and will restart them, however given that he has not had electrolytes rechecked recently and he is on hydrochlorothiazide, will check electrolytes and kidney function today.  Regarding patient's orthopedic complaints, clinical exam is consistent with AC joint arthritis.  Radiograph of the left shoulder is also demonstrating moderate arthritis of the [facility] joint.  Pain in bilateral lower extremities is not in the calves, unlikely to be DVT.  His anterior shin consistent with patient splinting.  I encourage patient to ice after physical activity on his feet.  I instructed him to use Tylenol over NSAIDs given his hypertension for pain.  Regarding patient's dysuria complaints, patient was empirically given STI treatment given his history and symptoms.  Urinalysis is clean.  Blood work is pending.  He is given return precautions for any testicular pain, swelling, or worsening urinary symptoms.  Final Clinical Impressions(s) / ED Diagnoses   Final diagnoses:  Exposure to STD  Chronic left shoulder pain  Bilateral leg pain  Essential hypertension    ED Discharge Orders         Ordered    amLODipine  (NORVASC) 5 MG tablet  Daily     10/19/18 1503    hydrochlorothiazide (HYDRODIURIL) 25 MG tablet  Daily     10/19/18 1503  Elisha Ponder, PA-C 10/19/18 1508    Tilden Fossa, MD 10/19/18 [phone]

## 2018-10-19 NOTE — TOC Initial Note (Signed)
Transition of Care [facility]) - Initial/Assessment Note    Patient Details  Name: Logan Brewer MRN: [id number] Date of Birth: 05/23/1956  [facility] CM/SW Contact:    Oletta Cohn, RN Phone Number: 10/19/2018, 1:51 PM  Clinical Narrative:                   Expected Discharge Plan: Home/Self Care Barriers to Discharge: Continued Medical Work up   Patient Goals and CMS Choice  [facility] consulted to assist pt with PCP appointment and medication assistance.      Expected Discharge Plan and Services Expected Discharge Plan: Home/Self Care   Discharge Planning Services: CM Consult, Follow-up appt scheduled   Living arrangements for the past 2 months: Single Family Home Expected Discharge Date: 10/19/18                                    Prior Living Arrangements/Services Living arrangements for the past 2 months: Single Family Home Lives with:: Adult Children Patient language and need for interpreter reviewed:: Yes              Criminal Activity/Legal Involvement Pertinent to Current Situation/Hospitalization: No - Comment as needed  Activities of Daily Living      Permission Sought/Granted Permission sought to share information with : Case Manager, Family Supports Permission granted to share information with : Yes, Verbal Permission Granted              Emotional Assessment       Orientation: : Oriented to Self, Oriented to Place, Oriented to  Time, Oriented to Situation Alcohol / Substance Use: Tobacco Use Psych Involvement: No (comment)  Admission diagnosis:  leg and shoulder pain/HBP Patient Active Problem List   Diagnosis Date Noted  . HTN (hypertension) 04/26/2013  . Leukocytosis, unspecified 04/26/2013  . Radiculopathy, lumbosacral region 04/25/2013  . Back pain 04/25/2013  . Anxiety 04/25/2013   PCP:  Patient, No Pcp Per Pharmacy:   [id number] - [place], [place] - [address] AT [facility] [address]  [place] [address] Phone: [phone] Fax: [phone]  [id number] [place], [place] - [address] AT [facility] OF [facility] & [address] [place] [address] Phone: [phone] Fax: [phone]     Social Determinants of Health (SDOH) Interventions    Readmission Risk Interventions No flowsheet data found.

## 2018-10-20 LAB — HIV ANTIBODY (ROUTINE TESTING W REFLEX): HIV Screen 4th Generation wRfx: NONREACTIVE

## 2018-10-20 LAB — RPR: RPR Ser Ql: NONREACTIVE

## 2018-10-20 LAB — GC/CHLAMYDIA PROBE AMP (~~LOC~~) NOT AT ARMC
Chlamydia: NEGATIVE
Neisseria Gonorrhea: NEGATIVE

## 2018-11-06 ENCOUNTER — Other Ambulatory Visit: Payer: Self-pay

## 2018-11-06 ENCOUNTER — Other Ambulatory Visit (INDEPENDENT_AMBULATORY_CARE_PROVIDER_SITE_OTHER): Payer: Self-pay | Admitting: *Deleted

## 2018-11-06 ENCOUNTER — Ambulatory Visit: Payer: Self-pay | Attending: Primary Care | Admitting: Primary Care

## 2018-11-06 ENCOUNTER — Encounter: Payer: Self-pay | Admitting: Primary Care

## 2018-11-06 DIAGNOSIS — Z09 Encounter for follow-up examination after completed treatment for conditions other than malignant neoplasm: Secondary | ICD-10-CM

## 2018-11-06 DIAGNOSIS — Z7689 Persons encountering health services in other specified circumstances: Secondary | ICD-10-CM

## 2018-11-06 DIAGNOSIS — I10 Essential (primary) hypertension: Secondary | ICD-10-CM

## 2018-11-06 DIAGNOSIS — M5417 Radiculopathy, lumbosacral region: Secondary | ICD-10-CM

## 2018-11-06 MED ORDER — GABAPENTIN 300 MG PO CAPS: 300.0000 mg | ORAL_CAPSULE | Freq: Two times a day (BID) | ORAL | 2 refills | Status: DC

## 2018-11-06 NOTE — Telephone Encounter (Signed)
Patient completed visit with you today.

## 2018-11-06 NOTE — Telephone Encounter (Signed)
Patient verified DOB Patient requesting pain medication refill.

## 2018-11-06 NOTE — Progress Notes (Signed)
Virtual Visit via Telephone Note  I connected with Logan Brewer on 11/06/18 at 10:30 AM EDT by telephone and verified that I am speaking with the correct person using two identifiers.   I discussed the limitations, risks, security and privacy concerns of performing an evaluation and management service by telephone and the availability of in person appointments. I also discussed with the patient that there may be a patient responsible charge related to this service. The patient expressed understanding and agreed to proceed.   History of Present Illness: Logan Brewer is establishing care and an ED f/u for leg and feet pain. Past medical history of hypertension and exposure to STI. His complaint is shoulder , back ,leg and feet pain. States he can barley walk and work. His occupation is    Observations/Objective: BP (!) 165/84 (BP Location: Right Arm)   Pulse 82   Temp 98.1 F (36.7 C) (Oral)   Resp 18   SpO2 100%  Retrieved from ED encounter  10/09/2018. Review of Systems  Constitutional: Negative.   HENT: Negative.   Eyes: Negative.   Respiratory: Negative.   Cardiovascular: Negative.   Gastrointestinal: Negative.   Genitourinary: Negative.   Musculoskeletal: Positive for back pain.       Shoulders and knees he works in [employer]  Skin: Negative.   Neurological: Negative.   Endo/Heme/Allergies: Negative.   Psychiatric/Behavioral: Negative.     Assessment and Plan: Calel was seen today for hospitalization follow-up.  Diagnoses and all orders for this visit:  Encounter to establish care No PCP listed in system frequent ED visit for muscle pain , STI , back pain. Goal to reduce ED visit that can be handle by PCP.  Essential hypertension Reviewed Bp  Elevated retrieved from 10/09/2018 visit BP (!) 165/84 (BP Location: Right Arm)   Pulse 82   Temp 98.1 F (36.7 C) (Oral)   Resp 18   SpO2 100%. Prescribed/on amlodipine and HCTZ   Radiculopathy, lumbosacral  region Refill gabapentin probably for neuropathy pain related type of occupation   Hospital discharge follow-up F/U on increased pain unable to bear weight unable to work . Remove from work allow inflammation to improve or resove    Follow Up Instructions:    I discussed the assessment and treatment plan with the patient. The patient was provided an opportunity to ask questions and all were answered. The patient agreed with the plan and demonstrated an understanding of the instructions.   The patient was advised to call back or seek an in-person evaluation if the symptoms worsen or if the condition fails to improve as anticipated.  I provided 22 minutes of non-face-to-face time during this encounter.   Grayce Sessions, NP

## 2018-11-06 NOTE — Telephone Encounter (Signed)
Refill done after est care

## 2018-11-06 NOTE — Progress Notes (Signed)
Patient verified DOB Patient has taken medication today. Patient has not eaten today. Patient complains of current pain at a 9 while walking. Pain is described as a burning sensation.

## 2018-11-07 ENCOUNTER — Other Ambulatory Visit: Payer: Self-pay | Admitting: Primary Care

## 2018-11-07 MED ORDER — METHOCARBAMOL 500 MG PO TABS: 500.0000 mg | ORAL_TABLET | Freq: Three times a day (TID) | ORAL | 0 refills | Status: DC | PRN

## 2018-11-07 NOTE — Telephone Encounter (Signed)
sent 

## 2018-12-13 ENCOUNTER — Other Ambulatory Visit: Payer: Self-pay | Admitting: Primary Care

## 2018-12-13 NOTE — Telephone Encounter (Signed)
New Message  1) Medication(s) Requested (by name): amLODipine ([place]) 5 MG tablet(Expired)  2) Pharmacy of Choice: [facility]  3) Special Requests:   Approved medications will be sent to the pharmacy, we will reach out if there is an issue.  Requests made after 3pm may not be addressed until the following business day!  If a patient is unsure of the name of the medication(s) please note and ask patient to call back when they are able to provide all info, do not send to responsible party until all information is available!

## 2018-12-15 ENCOUNTER — Other Ambulatory Visit (INDEPENDENT_AMBULATORY_CARE_PROVIDER_SITE_OTHER): Payer: Self-pay | Admitting: Primary Care

## 2018-12-15 MED ORDER — AMLODIPINE BESYLATE 5 MG PO TABS: 5.0000 mg | ORAL_TABLET | Freq: Every day | ORAL | 3 refills | Status: DC

## 2019-02-17 ENCOUNTER — Emergency Department (HOSPITAL_COMMUNITY)
Admission: EM | Admit: 2019-02-17 | Discharge: 2019-02-17 | Disposition: A | Discharge status: Home / Self Care | Payer: Self-pay | Attending: Emergency Medicine | Admitting: Emergency Medicine

## 2019-02-17 ENCOUNTER — Encounter (HOSPITAL_COMMUNITY): Payer: Self-pay | Admitting: Emergency Medicine

## 2019-02-17 ENCOUNTER — Other Ambulatory Visit: Payer: Self-pay

## 2019-02-17 DIAGNOSIS — Z79899 Other long term (current) drug therapy: Secondary | ICD-10-CM | POA: Insufficient documentation

## 2019-02-17 DIAGNOSIS — M79604 Pain in right leg: Secondary | ICD-10-CM | POA: Insufficient documentation

## 2019-02-17 DIAGNOSIS — M79605 Pain in left leg: Secondary | ICD-10-CM | POA: Insufficient documentation

## 2019-02-17 DIAGNOSIS — I10 Essential (primary) hypertension: Secondary | ICD-10-CM | POA: Insufficient documentation

## 2019-02-17 DIAGNOSIS — G8929 Other chronic pain: Secondary | ICD-10-CM | POA: Insufficient documentation

## 2019-02-17 DIAGNOSIS — F17210 Nicotine dependence, cigarettes, uncomplicated: Secondary | ICD-10-CM | POA: Insufficient documentation

## 2019-02-17 DIAGNOSIS — Z7251 High risk heterosexual behavior: Secondary | ICD-10-CM | POA: Insufficient documentation

## 2019-02-17 MED ORDER — AMLODIPINE BESYLATE 5 MG PO TABS: 5.0000 mg | ORAL_TABLET | Freq: Every day | ORAL | 0 refills | Status: DC

## 2019-02-17 MED ORDER — HYDROCHLOROTHIAZIDE 25 MG PO TABS: 25.0000 mg | ORAL_TABLET | Freq: Every day | ORAL | 0 refills | Status: DC

## 2019-02-17 NOTE — ED Notes (Signed)
Patient verbalizes understanding of discharge instructions . Opportunity for questions and answers were provided . Armband removed by staff ,Pt discharged from ED. W/C  offered at D/C  and Declined W/C at D/C and was escorted to lobby by RN.  

## 2019-02-17 NOTE — Discharge Instructions (Addendum)
If you begin having the pain in your legs, elevate your legs and take tylenol/acetaminophen every 4-6 hours for pain. Take your blood pressure medication as prescribed.  Follow up with your primary care provider for regular refills and management of your blood pressure, to follow up on your ongoing leg pain, for your concerns regarding erectile dysfunction and for any STD concerns. You can also go to the local health department for STD testing and treatment. It is always recommended you practice safe sexual practices and use condoms.  Avoid any sexual relations until you know the results of all of your STD testing. The hospital will contact you of any positive results. You can follow up all results on the MyChart portal.

## 2019-02-17 NOTE — ED Notes (Signed)
Pt stated he cant urinate at this time.

## 2019-02-17 NOTE — ED Provider Notes (Signed)
[facility] EMERGENCY DEPARTMENT Provider Note   CSN: [id number] Arrival date & time: 02/17/19  1144     History   Chief Complaint Chief Complaint  Patient presents with   Hypertension    HPI Logan Brewer is a 63 y.o. male with past history of hypertension, presenting to the emergency department with multiple complaints.  His main complaint is that he ran out of his blood pressure medication about 6 days ago.  He is noted to be hypertensive in triage, however has no symptoms of chest pain, shortness of breath, headache, vision changes.  He is followed by his PCP, however did not call him for refill because he decided to come to ED to also be seen for potential STD exposure after unprotected intercourse.  Per chart review, patient has had numerous ED visits with similar complaint, with concern for STD exposure after unprotected intercourse.  Last ED visit on 10/19/2018 revealed negative STD cultures of GC, chlamydia, HIV, and RPR.  He is having no associated dysuria, penile discharge or pain, testicular pain or swelling, abdominal pain, pain with defecation, fever. He is also complaining of bilateral shin pain, that is worse after walking or long day at work.  He was evaluated for this in the ED on 10/19/2018, however he has not tried any over-the-counter medications for symptomatic relief as recommended per previous provider.  He has not followed up with his primary care provider regarding this.  No knee or calf pain, skin changes, swelling.     The history is provided by the patient and medical records.    Past Medical History:  Diagnosis Date   Hypertension     Patient Active Problem List   Diagnosis Date Noted   HTN (hypertension) 04/26/2013   Leukocytosis, unspecified 04/26/2013   Radiculopathy, lumbosacral region 04/25/2013   Back pain 04/25/2013   Anxiety 04/25/2013    Past Surgical History:  Procedure Laterality Date   APPENDECTOMY           Home Medications    Prior to Admission medications   Medication Sig Start Date End Date Taking? Authorizing Provider  amLODipine (NORVASC) 5 MG tablet Take 1 tablet (5 mg total) by mouth daily. 02/17/19 03/19/19  Thurmond Hildebran, [place] N, PA-C  Besifloxacin HCl 0.6 % SUSP Place 1 drop into the left eye 2 (two) times daily.    [provider]  Difluprednate (DUREZOL) 0.05 % EMUL Place 1 drop into the left eye 3 (three) times daily.    [provider]  gabapentin (NEURONTIN) 300 MG capsule Take 1 capsule (300 mg total) by mouth 2 (two) times daily for 30 days. 11/06/18 12/06/18  Kerin Perna, NP  hydrochlorothiazide (HYDRODIURIL) 25 MG tablet Take 1 tablet (25 mg total) by mouth daily. 02/17/19 03/19/19  Minnette Merida, [place] N, PA-C  methocarbamol (ROBAXIN) 500 MG tablet Take 1 tablet (500 mg total) by mouth every 8 (eight) hours as needed for muscle spasms. 11/07/18   Kerin Perna, NP    Family History No family history on file.  Social History Social History   Tobacco Use   Smoking status: Current Every Day Smoker    Packs/day: 0.25    Types: Cigarettes   Smokeless tobacco: Never Used  Substance Use Topics   Alcohol use: No   Drug use: Yes    Types: Marijuana    Comment: occasionally      Allergies   Patient has no known allergies.   Review of Systems Review of  Systems  Musculoskeletal: Positive for myalgias.  All other systems reviewed and are negative.    Physical Exam Updated Vital Signs BP (!) 148/70    Pulse (!) 59    Temp 98.9 F (37.2 C) (Oral)    Resp 20    SpO2 99%   Physical Exam Vitals signs and nursing note reviewed.  Constitutional:      General: He is not in acute distress.    Appearance: He is well-developed. He is not ill-appearing.  HENT:     Head: Normocephalic and atraumatic.  Eyes:     Conjunctiva/sclera: Conjunctivae normal.  Cardiovascular:     Rate and Rhythm: Normal rate and regular rhythm.  Pulmonary:      Effort: Pulmonary effort is normal. No respiratory distress.     Breath sounds: Normal breath sounds.  Abdominal:     General: Bowel sounds are normal.     Palpations: Abdomen is soft.     Tenderness: There is no abdominal tenderness. There is no guarding or rebound.  Genitourinary:    Comments: deferred Musculoskeletal:        General: Tenderness present.     Right lower leg: No edema.     Left lower leg: No edema.     Comments: mild tenderness to b/l shins, no swelling or skin changes. Normal ROM to b/l knees and ankles.   Skin:    General: Skin is warm.  Neurological:     Mental Status: He is alert.  Psychiatric:        Behavior: Behavior normal.      ED Treatments / Results  Labs (all labs ordered are listed, but only abnormal results are displayed) Labs Reviewed  HIV ANTIBODY (ROUTINE TESTING W REFLEX)  RPR  GC/CHLAMYDIA PROBE AMP ([place]) NOT AT [facility]    EKG None  Radiology No results found.  Procedures Procedures (including critical care time)  Medications Ordered in ED Medications - No data to display   Initial Impression / Assessment and Plan / ED Course  I have reviewed the triage vital signs and the nursing notes.  Pertinent labs & imaging results that were available during my care of the patient were reviewed by me and considered in my medical decision making (see chart for details).        Pt presenting with multiple complaints.  Patient mainly here for medication refill for blood pressure medications as well as concern for potential STD exposure given recent unprotected intercourse with male partner.  He reports that he has no indication that his partner had any STDs, however he had unprotected intercourse and therefore is presenting with concern.  He has no GU symptoms.  He was seen for similar in April of this year and was treated prophylactically for gonorrhea and chlamydia, however his STD cultures were negative.  As patient is  presenting without symptoms today, recommend holding treatment and waiting for test results.  He has had multiple treatments for the same in the past, provided patient education regarding antibiotic resistance.  He is agreeable with this plan. Regarding patient's blood pressure, he is noted to be hypertensive today, however is completely asymptomatic.  He has been about 6 days without his medicines.  He does have a PCP, however did not call them because they are not doing in person visits.  He is provided a short refill of his BP medications, however strongly encouraged to follow-up with his PCP for regular management and monitoring of his blood pressure. Regarding  his chronic lower leg pain, this seems to be worse after work and prolonged exercise.  Given location, suspect possibility of shin splints.  He was instructed of symptomatic management and PCP follow-up if symptoms did not improve.  Patient is overall well-appearing, in no distress, safer discharge at this time.  Discussed results, findings, treatment and follow up. Patient advised of return precautions. Patient verbalized understanding and agreed with plan.   Final Clinical Impressions(s) / ED Diagnoses   Final diagnoses:  Hypertension, unspecified type  High risk sexual behavior, unspecified type  Chronic pain of both lower extremities    ED Discharge Orders         Ordered    amLODipine (NORVASC) 5 MG tablet  Daily     02/17/19 1227    hydrochlorothiazide (HYDRODIURIL) 25 MG tablet  Daily     02/17/19 1227           Kiri Hinderliter, [place] N, [place]PA-C 02/17/19 1457    Rolan BuccoBelfi, Melanie, MD 02/17/19 1538

## 2019-02-17 NOTE — ED Triage Notes (Signed)
Pt reports being out of his hypertension medications for 6-7 days. Also here due to having unprotected sex, but denies any pain or discharge. Also endorses pain in his legs for months and trouble walking long distances.

## 2019-02-17 NOTE — ED Triage Notes (Signed)
Pt reports he wants to speak to provider before he leaves.

## 2019-02-18 LAB — RPR: RPR Ser Ql: NONREACTIVE

## 2019-02-18 LAB — HIV ANTIBODY (ROUTINE TESTING W REFLEX): HIV Screen 4th Generation wRfx: NONREACTIVE

## 2019-02-20 LAB — GC/CHLAMYDIA PROBE AMP (~~LOC~~) NOT AT ARMC
Chlamydia: NEGATIVE
Neisseria Gonorrhea: NEGATIVE

## 2019-09-09 ENCOUNTER — Emergency Department (HOSPITAL_COMMUNITY)
Admission: EM | Admit: 2019-09-09 | Discharge: 2019-09-09 | Disposition: A | Discharge status: Home / Self Care | Payer: Self-pay | Attending: Emergency Medicine | Admitting: Emergency Medicine

## 2019-09-09 ENCOUNTER — Encounter (HOSPITAL_COMMUNITY): Payer: Self-pay | Admitting: Emergency Medicine

## 2019-09-09 ENCOUNTER — Emergency Department (HOSPITAL_COMMUNITY): Payer: Self-pay

## 2019-09-09 ENCOUNTER — Other Ambulatory Visit: Payer: Self-pay

## 2019-09-09 DIAGNOSIS — I10 Essential (primary) hypertension: Secondary | ICD-10-CM | POA: Insufficient documentation

## 2019-09-09 DIAGNOSIS — F17210 Nicotine dependence, cigarettes, uncomplicated: Secondary | ICD-10-CM | POA: Insufficient documentation

## 2019-09-09 DIAGNOSIS — Z79899 Other long term (current) drug therapy: Secondary | ICD-10-CM | POA: Insufficient documentation

## 2019-09-09 DIAGNOSIS — Z711 Person with feared health complaint in whom no diagnosis is made: Secondary | ICD-10-CM | POA: Insufficient documentation

## 2019-09-09 DIAGNOSIS — N433 Hydrocele, unspecified: Secondary | ICD-10-CM | POA: Insufficient documentation

## 2019-09-09 LAB — URINALYSIS, ROUTINE W REFLEX MICROSCOPIC
Bilirubin Urine: NEGATIVE
Glucose, UA: NEGATIVE mg/dL
Hgb urine dipstick: NEGATIVE
Ketones, ur: NEGATIVE mg/dL
Leukocytes,Ua: NEGATIVE
Nitrite: NEGATIVE
Protein, ur: NEGATIVE mg/dL
Specific Gravity, Urine: 1.023 (ref 1.005–1.030)
pH: 5 (ref 5.0–8.0)

## 2019-09-09 MED ORDER — CEFTRIAXONE SODIUM 500 MG IJ SOLR
500.0000 mg | Freq: Once | INTRAMUSCULAR | Status: AC
  Administered 2019-09-09: 500 mg via INTRAMUSCULAR
  Filled 2019-09-09: qty 500

## 2019-09-09 MED ORDER — DOXYCYCLINE HYCLATE 100 MG PO CAPS: 100.0000 mg | ORAL_CAPSULE | Freq: Two times a day (BID) | ORAL | 0 refills | Status: AC

## 2019-09-09 MED ORDER — LIDOCAINE HCL (PF) 1 % IJ SOLN
INTRAMUSCULAR | Status: AC
  Administered 2019-09-09: 5 mL
  Filled 2019-09-09: qty 5

## 2019-09-09 NOTE — ED Triage Notes (Signed)
C/o R testicle swelling x 2 months. Reports he had burning in penis a few weeks ago that resolved.  States his L testicle is occasionally swollen but is better after he squeezes it.

## 2019-09-09 NOTE — ED Provider Notes (Signed)
[facility] EMERGENCY DEPARTMENT Provider Note   CSN: [id number] Arrival date & time: 09/09/19  1125     History Chief Complaint  Patient presents with  . Groin Swelling    Logan Brewer is a 64 y.o. male with a past medical history of hypertension presenting to the ED with a chief complaint of right testicle swelling.  Noticed about 2 to 3 months ago that he has had painless swelling of the right testicle.  Had an incident 2 weeks ago where he had unprotected sexual intercourse and had some dysuria which resolved but he would still like to be tested for STDs.  He was diagnosed with an inguinal hernia on the left side several months ago which has been reducible.  He decided to come to the ER today as he states in the past he was "scared of getting Covid here."  Denies any current dysuria, abdominal pain, penile discharge, fever, rashes or lesions.  HPI     Past Medical History:  Diagnosis Date  . Hypertension     Patient Active Problem List   Diagnosis Date Noted  . HTN (hypertension) 04/26/2013  . Leukocytosis, unspecified 04/26/2013  . Radiculopathy, lumbosacral region 04/25/2013  . Back pain 04/25/2013  . Anxiety 04/25/2013    Past Surgical History:  Procedure Laterality Date  . APPENDECTOMY         No family history on file.  Social History   Tobacco Use  . Smoking status: Current Every Day Smoker    Packs/day: 0.25    Types: Cigarettes  . Smokeless tobacco: Never Used  Substance Use Topics  . Alcohol use: No  . Drug use: Yes    Types: Marijuana    Comment: occasionally     Home Medications Prior to Admission medications   Medication Sig Start Date End Date Taking? Authorizing Provider  amLODipine (NORVASC) 5 MG tablet Take 1 tablet (5 mg total) by mouth daily. 02/17/19 03/19/19  Robinson, [place] N, PA-C  Besifloxacin HCl 0.6 % SUSP Place 1 drop into the left eye 2 (two) times daily.    [provider]  Difluprednate  (DUREZOL) 0.05 % EMUL Place 1 drop into the left eye 3 (three) times daily.    [provider]  doxycycline (VIBRAMYCIN) 100 MG capsule Take 1 capsule (100 mg total) by mouth 2 (two) times daily for 7 days. 09/09/19 09/16/19  Laaibah Wartman, PA-C  gabapentin (NEURONTIN) 300 MG capsule Take 1 capsule (300 mg total) by mouth 2 (two) times daily for 30 days. 11/06/18 12/06/18  Grayce Sessions, NP  hydrochlorothiazide (HYDRODIURIL) 25 MG tablet Take 1 tablet (25 mg total) by mouth daily. 02/17/19 03/19/19  Robinson, [place] N, PA-C  methocarbamol (ROBAXIN) 500 MG tablet Take 1 tablet (500 mg total) by mouth every 8 (eight) hours as needed for muscle spasms. 11/07/18   Grayce Sessions, NP    Allergies    Patient has no known allergies.  Review of Systems   Review of Systems  Constitutional: Negative for appetite change, chills and fever.  HENT: Negative for ear pain, rhinorrhea, sneezing and sore throat.   Eyes: Negative for photophobia and visual disturbance.  Respiratory: Negative for cough, chest tightness, shortness of breath and wheezing.   Cardiovascular: Negative for chest pain and palpitations.  Gastrointestinal: Negative for abdominal pain, blood in stool, constipation, diarrhea, nausea and vomiting.  Genitourinary: Positive for scrotal swelling. Negative for dysuria, hematuria and urgency.  Musculoskeletal: Negative for myalgias.  Skin: Negative  for rash.  Neurological: Negative for dizziness, weakness and light-headedness.    Physical Exam Updated Vital Signs BP (!) 166/94 (BP Location: Left Arm)   Pulse 79   Temp 98.4 F (36.9 C) (Oral)   Resp 14   SpO2 98%   Physical Exam Vitals and nursing note reviewed.  Constitutional:      General: He is not in acute distress.    Appearance: He is well-developed.  HENT:     Head: Normocephalic and atraumatic.     Nose: Nose normal.  Eyes:     General: No scleral icterus.       Left eye: No discharge.      Conjunctiva/sclera: Conjunctivae normal.  Cardiovascular:     Rate and Rhythm: Normal rate and regular rhythm.     Heart sounds: Normal heart sounds. No murmur. No friction rub. No gallop.   Pulmonary:     Effort: Pulmonary effort is normal. No respiratory distress.     Breath sounds: Normal breath sounds.  Abdominal:     General: Bowel sounds are normal. There is no distension.     Palpations: Abdomen is soft.     Tenderness: There is no abdominal tenderness. There is no guarding.  Musculoskeletal:        General: Normal range of motion.     Cervical back: Normal range of motion and neck supple.  Skin:    General: Skin is warm and dry.     Findings: No rash.     Comments: Chaperone present.  Fluid-filled right scrotum without tenderness, mass or overlying skin changes. Penis without any abnormalities.  Left-sided reducible inguinal hernia.  Neurological:     Mental Status: He is alert.     Motor: No abnormal muscle tone.     Coordination: Coordination normal.     ED Results / Procedures / Treatments   Labs (all labs ordered are listed, but only abnormal results are displayed) Labs Reviewed  URINALYSIS, ROUTINE W REFLEX MICROSCOPIC  GC/CHLAMYDIA PROBE AMP ([place]) NOT AT [facility]    EKG None  Radiology US SCROTUM W/DOPPLER  Result Date: 09/09/2019 CLINICAL DATA:  Bulge in the LEFT groin. RIGHT scrotal swelling for 2 months. EXAM: SCROTAL ULTRASOUND DOPPLER ULTRASOUND OF THE TESTICLES TECHNIQUE: Complete ultrasound examination of the testicles, epididymis, and other scrotal structures was performed. Color and spectral Doppler ultrasound were also utilized to evaluate blood flow to the testicles. COMPARISON:  None. FINDINGS: Right testicle Measurements: 5.1 x 3.0 x 3.7 centimeters. No mass or microlithiasis visualized. Left testicle Measurements: 4.7 x 3.4 x 3.8 centimeters. No mass or microlithiasis visualized. Right epididymis:  Normal in size and appearance. Left epididymis:   Normal in size and appearance. Hydrocele:  Large bilateral hydroceles, RIGHT greater than LEFT Varicocele:  None visualized. Pulsed Doppler interrogation of both testes demonstrates normal low resistance arterial and venous waveforms bilaterally. Targeted evaluation of the LEFT inguinal canal shows bowel loops present, demonstrating movement with Valsalva maneuver. IMPRESSION: 1. No evidence for testicular mass or torsion. 2. Large bilateral hydroceles, RIGHT greater than LEFT. 3. LEFT inguinal hernia. Electronically Signed   By: Norva Pavlov M.D.   On: 09/09/2019 14:57    Procedures Procedures (including critical care time)  Medications Ordered in ED Medications  cefTRIAXone (ROCEPHIN) injection 500 mg (has no administration in time range)    ED Course  I have reviewed the triage vital signs and the nursing notes.  Pertinent labs & imaging results that were available during my care  of the patient were reviewed by me and considered in my medical decision making (see chart for details).    MDM Rules/Calculators/A&P                      64 year old male presents to ED with a chief complaint of right scrotal swelling for the past 2 to 3 months.  Reports painless swelling that he noticed without specific trigger.  He is also concerned about STDs but denies any dysuria, penile discharge or rashes.  On exam there appears to be a large fluid-filled collection of the right scrotum without tenderness or palpable mass.  Urinalysis without signs of infection or other abnormalities.  Ultrasound shows bilateral hydroceles without evidence of mass or torsion.  Also inguinal hernia is evident on exam and ultrasound.  Patient provided with treatment for gonorrhea and chlamydia per his preference.  He will be referred to urology for management of his hydroceles.  Patient is hemodynamically stable, in NAD, and able to ambulate in the ED. Evaluation does not show pathology that would require ongoing emergent  intervention or inpatient treatment. I have personally reviewed and interpreted all lab work and imaging at today's ED visit. I explained the diagnosis to the patient. Pain has been managed and has no complaints prior to discharge. Patient is comfortable with above plan and is stable for discharge at this time. All questions were answered prior to disposition. Strict return precautions for returning to the ED were discussed. Encouraged follow up with PCP.   An After Visit Summary was printed and given to the patient.   Portions of this note were generated with [employer]. Dictation errors may occur despite best attempts at proofreading.  Final Clinical Impression(s) / ED Diagnoses Final diagnoses:  Hydrocele in adult  Concern about STD in male without diagnosis    Rx / DC Orders ED Discharge Orders         Ordered    doxycycline (VIBRAMYCIN) 100 MG capsule  2 times daily     09/09/19 [address] PA-C 09/09/19 1515    Fredia Sorrow, MD 09/10/19 1605

## 2019-09-09 NOTE — ED Notes (Signed)
Returned from [facility]

## 2019-09-09 NOTE — Discharge Instructions (Addendum)
Take the antibiotics as directed. Follow-up with the urologist listed below for further management of your symptoms. We will contact you with the results of your lab work when it is available. Return to the ER if you start to have worsening symptoms, worsening pain, chest pain, shortness of breath.

## 2019-09-09 NOTE — ED Notes (Signed)
C/o testicular swelling for 3 months- states "I was scared to come in because of covid" denies dysuria,

## 2019-09-11 LAB — GC/CHLAMYDIA PROBE AMP (~~LOC~~) NOT AT ARMC
Chlamydia: NEGATIVE
Neisseria Gonorrhea: NEGATIVE

## 2019-11-01 ENCOUNTER — Encounter: Payer: Self-pay | Admitting: General Surgery

## 2019-11-01 ENCOUNTER — Other Ambulatory Visit: Payer: Self-pay

## 2019-11-01 ENCOUNTER — Ambulatory Visit (INDEPENDENT_AMBULATORY_CARE_PROVIDER_SITE_OTHER): Payer: Self-pay | Admitting: General Surgery

## 2019-11-01 VITALS — BP 168/78 | HR 69 | Temp 98.3°F | Wt 167.1 lb

## 2019-11-01 DIAGNOSIS — K4090 Unilateral inguinal hernia, without obstruction or gangrene, not specified as recurrent: Secondary | ICD-10-CM

## 2019-11-01 NOTE — Progress Notes (Signed)
Logan Brewer; [id number]; [birth year]   HPI Patient is a 64 year old black male who was referred to my care by Dr. Alyson Ingles of urology for evaluation and treatment of a left inguinal hernia.  Patient was seen by Dr. Alyson Ingles for bilateral hydroceles but he was noted to have a left inguinal hernia.  He has been referred for a combined surgical approach if needed.  Patient states he has had increasing pain and swelling in the left groin region for some time now.  No fever chills have been noted.  He currently has 0 out of 10 abdominal pain. Past Medical History:  Diagnosis Date  . Hypertension     Past Surgical History:  Procedure Laterality Date  . APPENDECTOMY      History reviewed. No pertinent family history.  Current Outpatient Medications on File Prior to Visit  Medication Sig Dispense Refill  . amLODipine (NORVASC) 5 MG tablet Take 1 tablet (5 mg total) by mouth daily. 30 tablet 0  . Besifloxacin HCl 0.6 % SUSP Place 1 drop into the left eye 2 (two) times daily.    . Difluprednate (DUREZOL) 0.05 % EMUL Place 1 drop into the left eye 3 (three) times daily.    Marland Kitchen gabapentin (NEURONTIN) 300 MG capsule Take 1 capsule (300 mg total) by mouth 2 (two) times daily for 30 days. 60 capsule 2  . hydrochlorothiazide (HYDRODIURIL) 25 MG tablet Take 1 tablet (25 mg total) by mouth daily. 30 tablet 0  . methocarbamol (ROBAXIN) 500 MG tablet Take 1 tablet (500 mg total) by mouth every 8 (eight) hours as needed for muscle spasms. (Patient not taking: Reported on 11/01/2019) 60 tablet 0   No current facility-administered medications on file prior to visit.    No Known Allergies  Social History   Substance and Sexual Activity  Alcohol Use No    Social History   Tobacco Use  Smoking Status Current Every Day Smoker  . Packs/day: 0.50  . Types: Cigarettes  Smokeless Tobacco Never Used    Review of Systems  Constitutional: Negative.   HENT: Negative.   Eyes: Negative.   Respiratory:  Positive for wheezing.   Cardiovascular: Negative.   Gastrointestinal: Negative.   Genitourinary: Negative.   Musculoskeletal: Positive for joint pain.  Skin: Negative.   Neurological: Positive for headaches.  Endo/Heme/Allergies: Negative.   Psychiatric/Behavioral: Negative.     Objective   Vitals:   11/01/19 0954  BP: (!) 168/78  Pulse: 69  Temp: 98.3 F (36.8 C)  SpO2: 97%    Physical Exam Vitals reviewed.  Constitutional:      Appearance: Normal appearance. He is normal weight. He is not ill-appearing.  HENT:     Head: Normocephalic and atraumatic.  Cardiovascular:     Rate and Rhythm: Normal rate and regular rhythm.     Heart sounds: Normal heart sounds. No murmur. No friction rub. No gallop.   Pulmonary:     Effort: Pulmonary effort is normal. No respiratory distress.     Breath sounds: Normal breath sounds. No stridor. No wheezing, rhonchi or rales.  Abdominal:     General: Abdomen is flat. Bowel sounds are normal. There is no distension.     Palpations: Abdomen is soft. There is no mass.     Tenderness: There is no abdominal tenderness. There is no guarding or rebound.     Hernia: A hernia is present.     Comments: Left reducible inguinal hernia with small right reducible inguinal hernia.  Genitourinary:  Comments: Bilateral hydroceles, right greater than left. Skin:    General: Skin is warm and dry.  Neurological:     Mental Status: He is alert and oriented to person, place, and time.    Dr. Dimas Millin notes reviewed Assessment  Left inguinal hernia, symptomatic Right inguinal hernia, asymptomatic Bilateral hydroceles Plan   We will coordinate left inguinal herniorrhaphy with mesh with bilateral hydrocelectomy by Dr. Ronne Binning.  The risks and benefits of the procedure including bleeding, infection, mesh use, and the possibility of recurrence of the hernia were fully explained to the patient, who gave informed consent.  I told the patient that I do not  recommend a right inguinal hernia repair at this time as he is asymptomatic.  He understands and agrees.

## 2019-11-01 NOTE — Patient Instructions (Signed)

## 2019-11-07 NOTE — H&P (Signed)
Logan Brewer; [id number]; 07/18/1955   HPI Patient is a 63-year-old black male who was referred to my care by Dr. McKenzie of urology for evaluation and treatment of a left inguinal hernia.  Patient was seen by Dr. McKenzie for bilateral hydroceles but he was noted to have a left inguinal hernia.  He has been referred for a combined surgical approach if needed.  Patient states he has had increasing pain and swelling in the left groin region for some time now.  No fever chills have been noted.  He currently has 0 out of 10 abdominal pain. Past Medical History:  Diagnosis Date  . Hypertension     Past Surgical History:  Procedure Laterality Date  . APPENDECTOMY      History reviewed. No pertinent family history.  Current Outpatient Medications on File Prior to Visit  Medication Sig Dispense Refill  . amLODipine (NORVASC) 5 MG tablet Take 1 tablet (5 mg total) by mouth daily. 30 tablet 0  . Besifloxacin HCl 0.6 % SUSP Place 1 drop into the left eye 2 (two) times daily.    . Difluprednate (DUREZOL) 0.05 % EMUL Place 1 drop into the left eye 3 (three) times daily.    . gabapentin (NEURONTIN) 300 MG capsule Take 1 capsule (300 mg total) by mouth 2 (two) times daily for 30 days. 60 capsule 2  . hydrochlorothiazide (HYDRODIURIL) 25 MG tablet Take 1 tablet (25 mg total) by mouth daily. 30 tablet 0  . methocarbamol (ROBAXIN) 500 MG tablet Take 1 tablet (500 mg total) by mouth every 8 (eight) hours as needed for muscle spasms. (Patient not taking: Reported on 11/01/2019) 60 tablet 0   No current facility-administered medications on file prior to visit.    No Known Allergies  Social History   Substance and Sexual Activity  Alcohol Use No    Social History   Tobacco Use  Smoking Status Current Every Day Smoker  . Packs/day: 0.50  . Types: Cigarettes  Smokeless Tobacco Never Used    Review of Systems  Constitutional: Negative.   HENT: Negative.   Eyes: Negative.   Respiratory:  Positive for wheezing.   Cardiovascular: Negative.   Gastrointestinal: Negative.   Genitourinary: Negative.   Musculoskeletal: Positive for joint pain.  Skin: Negative.   Neurological: Positive for headaches.  Endo/Heme/Allergies: Negative.   Psychiatric/Behavioral: Negative.     Objective   Vitals:   11/01/19 0954  BP: (!) 168/78  Pulse: 69  Temp: 98.3 F (36.8 C)  SpO2: 97%    Physical Exam Vitals reviewed.  Constitutional:      Appearance: Normal appearance. He is normal weight. He is not ill-appearing.  HENT:     Head: Normocephalic and atraumatic.  Cardiovascular:     Rate and Rhythm: Normal rate and regular rhythm.     Heart sounds: Normal heart sounds. No murmur. No friction rub. No gallop.   Pulmonary:     Effort: Pulmonary effort is normal. No respiratory distress.     Breath sounds: Normal breath sounds. No stridor. No wheezing, rhonchi or rales.  Abdominal:     General: Abdomen is flat. Bowel sounds are normal. There is no distension.     Palpations: Abdomen is soft. There is no mass.     Tenderness: There is no abdominal tenderness. There is no guarding or rebound.     Hernia: A hernia is present.     Comments: Left reducible inguinal hernia with small right reducible inguinal hernia.  Genitourinary:      Comments: Bilateral hydroceles, right greater than left. Skin:    General: Skin is warm and dry.  Neurological:     Mental Status: He is alert and oriented to person, place, and time.    Dr. McKenzie's notes reviewed Assessment  Left inguinal hernia, symptomatic Right inguinal hernia, asymptomatic Bilateral hydroceles Plan   We will coordinate left inguinal herniorrhaphy with mesh with bilateral hydrocelectomy by Dr. McKenzie.  The risks and benefits of the procedure including bleeding, infection, mesh use, and the possibility of recurrence of the hernia were fully explained to the patient, who gave informed consent.  I told the patient that I do not  recommend a right inguinal hernia repair at this time as he is asymptomatic.  He understands and agrees. 

## 2019-11-08 ENCOUNTER — Encounter (INDEPENDENT_AMBULATORY_CARE_PROVIDER_SITE_OTHER): Payer: Self-pay | Admitting: Primary Care

## 2019-11-08 ENCOUNTER — Other Ambulatory Visit: Payer: Self-pay

## 2019-11-08 ENCOUNTER — Ambulatory Visit (INDEPENDENT_AMBULATORY_CARE_PROVIDER_SITE_OTHER): Payer: Self-pay | Admitting: Primary Care

## 2019-11-08 VITALS — BP 169/73 | HR 70 | Temp 97.3°F | Ht 70.0 in | Wt 172.6 lb

## 2019-11-08 DIAGNOSIS — G8929 Other chronic pain: Secondary | ICD-10-CM

## 2019-11-08 DIAGNOSIS — F17200 Nicotine dependence, unspecified, uncomplicated: Secondary | ICD-10-CM

## 2019-11-08 DIAGNOSIS — M25562 Pain in left knee: Secondary | ICD-10-CM

## 2019-11-08 DIAGNOSIS — M25561 Pain in right knee: Secondary | ICD-10-CM

## 2019-11-08 DIAGNOSIS — I10 Essential (primary) hypertension: Secondary | ICD-10-CM

## 2019-11-08 MED ORDER — NICOTINE 14 MG/24HR TD PT24: 14.0000 mg | MEDICATED_PATCH | Freq: Every day | TRANSDERMAL | 0 refills | Status: DC

## 2019-11-08 MED ORDER — HYDROCHLOROTHIAZIDE 25 MG PO TABS: 25.0000 mg | ORAL_TABLET | Freq: Every day | ORAL | 3 refills | Status: DC

## 2019-11-08 MED ORDER — IBUPROFEN 800 MG PO TABS: 600.0000 mg | ORAL_TABLET | Freq: Three times a day (TID) | ORAL | 1 refills | Status: DC | PRN

## 2019-11-08 MED ORDER — NICOTINE 7 MG/24HR TD PT24: 7.0000 mg | MEDICATED_PATCH | Freq: Every day | TRANSDERMAL | 0 refills | Status: DC

## 2019-11-08 MED ORDER — AMLODIPINE BESYLATE 10 MG PO TABS: 10.0000 mg | ORAL_TABLET | Freq: Every day | ORAL | 3 refills | Status: DC

## 2019-11-08 MED ORDER — NICOTINE 21 MG/24HR TD PT24: 21.0000 mg | MEDICATED_PATCH | Freq: Every day | TRANSDERMAL | 0 refills | Status: DC

## 2019-11-08 NOTE — Patient Instructions (Signed)

## 2019-11-08 NOTE — Progress Notes (Signed)
Established Patient Office Visit  Subjective:  Patient ID: Logan Brewer, male    DOB: January 19, 1956  Age: 64 y.o. MRN: [id number]  CC:  Chief Complaint  Patient presents with  . Hypertension    HPI Logan Brewer presents for management of hypertension. He denies shortness of breath, headaches, chest pain or lower extremity edema, sudden onset, vision changes, unilateral weakness, dizziness, paresthesias Past Medical History:  Diagnosis Date  . Hypertension     Past Surgical History:  Procedure Laterality Date  . APPENDECTOMY      History reviewed. No pertinent family history.  Social History   Socioeconomic History  . Marital status: Single    Spouse name: Not on file  . Number of children: Not on file  . Years of education: Not on file  . Highest education level: Not on file  Occupational History  . Not on file  Tobacco Use  . Smoking status: Current Every Day Smoker    Packs/day: 0.50    Types: Cigarettes  . Smokeless tobacco: Never Used  Substance and Sexual Activity  . Alcohol use: No  . Drug use: Yes    Types: Marijuana    Comment: occasionally   . Sexual activity: Not on file  Other Topics Concern  . Not on file  Social History Narrative  . Not on file   Social Determinants of Health   Financial Resource Strain:   . Difficulty of Paying Living Expenses:   Food Insecurity:   . Worried About Charity fundraiser in the Last Year:   . Arboriculturist in the Last Year:   Transportation Needs:   . Film/video editor (Medical):   Marland Kitchen Lack of Transportation (Non-Medical):   Physical Activity:   . Days of Exercise per Week:   . Minutes of Exercise per Session:   Stress:   . Feeling of Stress :   Social Connections:   . Frequency of Communication with Friends and Family:   . Frequency of Social Gatherings with Friends and Family:   . Attends Religious Services:   . Active Member of Clubs or Organizations:   . Attends Archivist  Meetings:   Marland Kitchen Marital Status:   Intimate Partner Violence:   . Fear of Current or Ex-Partner:   . Emotionally Abused:   Marland Kitchen Physically Abused:   . Sexually Abused:     Outpatient Medications Prior to Visit  Medication Sig Dispense Refill  . gabapentin (NEURONTIN) 300 MG capsule Take 1 capsule (300 mg total) by mouth 2 (two) times daily for 30 days. 60 capsule 2  . amLODipine (NORVASC) 5 MG tablet Take 1 tablet (5 mg total) by mouth daily. 30 tablet 0  . Besifloxacin HCl 0.6 % SUSP Place 1 drop into the left eye 2 (two) times daily.    . Difluprednate (DUREZOL) 0.05 % EMUL Place 1 drop into the left eye 3 (three) times daily.    . hydrochlorothiazide (HYDRODIURIL) 25 MG tablet Take 1 tablet (25 mg total) by mouth daily. 30 tablet 0  . methocarbamol (ROBAXIN) 500 MG tablet Take 1 tablet (500 mg total) by mouth every 8 (eight) hours as needed for muscle spasms. (Patient not taking: Reported on 11/01/2019) 60 tablet 0   No facility-administered medications prior to visit.    No Known Allergies  ROS Review of Systems  Musculoskeletal:       Bilateral knee pain       Objective:    Physical Exam  Constitutional: He is oriented to person, place, and time. He appears well-developed and well-nourished.  HENT:  Head: Normocephalic.  Eyes: Pupils are equal, round, and reactive to light. EOM are normal.  Cardiovascular: Normal rate and regular rhythm.  Pulmonary/Chest: Effort normal and breath sounds normal.  Abdominal: Soft. Bowel sounds are normal.  Musculoskeletal:        General: Normal range of motion.     Cervical back: Normal range of motion and neck supple.  Neurological: He is alert and oriented to person, place, and time. He has normal reflexes.  Skin: Skin is warm and dry.  Psychiatric: He has a normal mood and affect. His behavior is normal. Judgment and thought content normal.    BP (!) 169/73 (BP Location: Left Arm, Patient Position: Sitting, Cuff Size: Normal)   Pulse  70   Temp (!) 97.3 F (36.3 C) (Temporal)   Ht 5\' 10"  (1.778 m)   Wt 172 lb 9.6 oz (78.3 kg)   SpO2 97%   BMI 24.77 kg/m  Wt Readings from Last 3 Encounters:  11/08/19 172 lb 9.6 oz (78.3 kg)  11/01/19 167 lb 1.3 oz (75.8 kg)  08/31/18 180 lb (81.6 kg)     Health Maintenance Due  Topic Date Due  . Hepatitis C Screening  Never done  . COVID-19 Vaccine (1) Never done  . TETANUS/TDAP  Never done  . COLONOSCOPY  Never done    There are no preventive care reminders to display for this patient.  No results found for: TSH Lab Results  Component Value Date   WBC 6.5 08/31/2018   HGB 15.0 10/19/2018   HCT 44.0 10/19/2018   MCV 96.2 08/31/2018   PLT 264 08/31/2018   Lab Results  Component Value Date   NA 139 10/19/2018   K 4.4 10/19/2018   CO2 23 08/31/2018   GLUCOSE 100 (H) 08/31/2018   BUN 15 08/31/2018   CREATININE 1.21 08/31/2018   BILITOT 0.2 (L) 04/25/2013   ALKPHOS 74 04/25/2013   AST 12 04/25/2013   ALT 14 04/25/2013   PROT 7.0 04/25/2013   ALBUMIN 3.2 (L) 04/25/2013   CALCIUM 8.6 (L) 08/31/2018   ANIONGAP 5 08/31/2018   No results found for: CHOL No results found for: HDL No results found for: LDLCALC No results found for: TRIG No results found for: CHOLHDL No results found for: 10/31/2018    Assessment & Plan:  Logan Brewer was seen today for hypertension.  Diagnoses and all orders for this visit:  Essential hypertension Counseled on blood pressure goal of less than 130/80, low-sodium, DASH diet, medication compliance, 150 minutes of moderate intensity exercise per week. Discussed medication compliance, adverse effects. -     amLODipine (NORVASC) 10 MG tablet; Take 1 tablet (10 mg total) by mouth daily. -     hydrochlorothiazide (HYDRODIURIL) 25 MG tablet; Take 1 tablet (25 mg total) by mouth daily.  Tobacco dependence -     nicotine (NICODERM CQ) 21 mg/24hr patch; Place 1 patch (21 mg total) onto the skin daily. -     nicotine (NICODERM CQ) 14 mg/24hr  patch; Place 1 patch (14 mg total) onto the skin daily. -     nicotine (NICODERM CQ) 7 mg/24hr patch; Place 1 patch (7 mg total) onto the skin daily.  Chronic pain of both knees . May alternate with heat and ice application for pain relief. May also alternate with acetaminophen and Ibuprofen 800mg  three times a day as needed  as prescribed pain  relief. Other alternatives include massage, acupuncture and water aerobics.  You must stay active and avoid a sedentary lifestyle.  Other orders -     ibuprofen (ADVIL) 800 MG tablet; Take 1 tablet (800 mg total) by mouth every 8 (eight) hours as needed for moderate pain.    Meds ordered this encounter  Medications  . amLODipine (NORVASC) 10 MG tablet    Sig: Take 1 tablet (10 mg total) by mouth daily.    Dispense:  90 tablet    Refill:  3  . hydrochlorothiazide (HYDRODIURIL) 25 MG tablet    Sig: Take 1 tablet (25 mg total) by mouth daily.    Dispense:  90 tablet    Refill:  3  . nicotine (NICODERM CQ) 21 mg/24hr patch    Sig: Place 1 patch (21 mg total) onto the skin daily.    Dispense:  28 patch    Refill:  0  . nicotine (NICODERM CQ) 14 mg/24hr patch    Sig: Place 1 patch (14 mg total) onto the skin daily.    Dispense:  28 patch    Refill:  0  . nicotine (NICODERM CQ) 7 mg/24hr patch    Sig: Place 1 patch (7 mg total) onto the skin daily.    Dispense:  28 patch    Refill:  0  . ibuprofen (ADVIL) 800 MG tablet    Sig: Take 1 tablet (800 mg total) by mouth every 8 (eight) hours as needed for moderate pain.    Dispense:  90 tablet    Refill:  1    Follow-up: Return in about 10 days (around 11/18/2019) for BP follow up .    Grayce Sessions, NP

## 2019-11-13 NOTE — Patient Instructions (Signed)
Logan Brewer  11/13/2019     @PREFPERIOPPHARMACY @   Your procedure is scheduled on  11/21/2019 .  Report to [facility] at  [phone]  A.M.  Call this number if you have problems the morning of surgery:  [phone]   Remember:  Do not eat or drink after midnight.                      Take these medicines the morning of surgery with A SIP OF WATER  Amlodipine, gabapentin.    Do not wear jewelry, make-up or nail polish.  Do not wear lotions, powders, or perfumes. Please wear deodorant and brush your teeth.  Do not shave 48 hours prior to surgery.  Men may shave face and neck.  Do not bring valuables to the hospital.  [facility] is not responsible for any belongings or valuables.  Contacts, dentures or bridgework may not be worn into surgery.  Leave your suitcase in the car.  After surgery it may be brought to your room.  For patients admitted to the hospital, discharge time will be determined by your treatment team.  Patients discharged the day of surgery will not be allowed to drive home.   Name and phone number of your driver:   family Special instructions:  DO NOT smoke the morning of your procedure.  Please read over the following fact sheets that you were given. Anesthesia Post-op Instructions and Care and Recovery After Surgery       Open Hernia Repair, Adult, Care After These instructions give you information about caring for yourself after your procedure. Your doctor may also give you more specific instructions. If you have problems or questions, contact your doctor. Follow these instructions at home: Surgical cut (incision) care   Follow instructions from your doctor about how to take care of your surgical cut area. Make sure you: ? Wash your hands with soap and water before you change your bandage (dressing). If you cannot use soap and water, use hand sanitizer. ? Change your bandage as told by your doctor. ? Leave stitches (sutures), skin glue,  or skin tape (adhesive) strips in place. They may need to stay in place for 2 weeks or longer. If tape strips get loose and curl up, you may trim the loose edges. Do not remove tape strips completely unless your doctor says it is okay.  Check your surgical cut every day for signs of infection. Check for: ? More redness, swelling, or pain. ? More fluid or blood. ? Warmth. ? Pus or a bad smell. Activity  Do not drive or use heavy machinery while taking prescription pain medicine. Do not drive until your doctor says it is okay.  Until your doctor says it is okay: ? Do not lift anything that is heavier than 10 lb (4.5 kg). ? Do not play contact sports.  Return to your normal activities as told by your doctor. Ask your doctor what activities are safe. General instructions  To prevent or treat having a hard time pooping (constipation) while you are taking prescription pain medicine, your doctor may recommend that you: ? Drink enough fluid to keep your pee (urine) clear or pale yellow. ? Take over-the-counter or prescription medicines. ? Eat foods that are high in fiber, such as fresh fruits and vegetables, whole grains, and beans. ? Limit foods that are high in fat and processed sugars, such as fried and sweet  foods.  Take over-the-counter and prescription medicines only as told by your doctor.  Do not take baths, swim, or use a hot tub until your doctor says it is okay.  Keep all follow-up visits as told by your doctor. This is important. Contact a doctor if:  You develop a rash.  You have more redness, swelling, or pain around your surgical cut.  You have more fluid or blood coming from your surgical cut.  Your surgical cut feels warm to the touch.  You have pus or a bad smell coming from your surgical cut.  You have a fever or chills.  You have blood in your poop (stool).  You have not pooped in 2-3 days.  Medicine does not help your pain. Get help right away if:  You  have chest pain or you are short of breath.  You feel light-headed.  You feel weak and dizzy (feel faint).  You have very bad pain.  You throw up (vomit) and your pain is worse. This information is not intended to replace advice given to you by your health care provider. Make sure you discuss any questions you have with your health care provider. Document Revised: 09/29/2018 Document Reviewed: 11/19/2015 Elsevier Patient Education  [employer].  Current Urology, 10(1), 1-14. https://[email or web address]/[email or web address]">  Hydrocelectomy, Adult, Care After This sheet gives you information about how to care for yourself after your procedure. Your health care provider may also give you more specific instructions. If you have problems or questions, contact your health care provider. What can I expect after the procedure? After your procedure, it is common to have:  Mild discomfort and swelling in the pouch that holds your testicles (scrotum).  Bruising of the scrotum. Follow these instructions at home: Medicines  Take over-the-counter and prescription medicines only as told by your health care provider.  Ask your health care provider if the medicine prescribed to you: ? Requires you to avoid driving or using heavy machinery. ? Can cause constipation. You may need to take these actions to prevent or treat constipation:  Drink enough fluid to keep your urine pale yellow.  Take over-the-counter or prescription medicines.  Eat foods that are high in fiber, such as beans, whole grains, and fresh fruits and vegetables.  Limit foods that are high in fat and processed sugars, such as fried or sweet foods. Bathing  Do not take baths, swim, or use a hot tub until your health care provider approves. Ask your health care provider if you may take showers. You may only be allowed to take sponge baths.  If you were told to wear an athletic support strap (scrotal support), keep it dry. Take it off  when you shower or bathe. Incision care   Follow instructions from your health care provider about how to take care of your incision. Make sure you: ? Wash your hands with soap and water before and after you change your bandage (dressing). If soap and water are not available, use hand sanitizer. ? Change your dressing as told by your health care provider. ? Leave stitches (sutures), skin glue, or adhesive strips in place. These skin closures may need to stay in place for 2 weeks or longer. If adhesive strip edges start to loosen and curl up, you may trim the loose edges. Do not remove adhesive strips completely unless your health care provider tells you to do that.  Check your incision and scrotum every day for signs of infection. Check for: ? More redness,  swelling, or pain. ? Fluid or blood. ? Warmth. ? Pus or a bad smell. Managing pain and swelling If directed, put ice on the affected area. To do this:  Put ice in a plastic bag.  Place a towel between your skin and the bag.  Leave the ice on for 20 minutes, 2-3 times per day.  Activity  Do not do any high-energy activities for as long as told by your health care provider.  Do not lift anything that is heavier than 10 lb (4.5 kg), or the limit that you are told, until your health care provider says that it is safe.  Return to your normal activities as told by your health care provider. Ask your health care provider what activities are safe for you.  Do not drive for 24 hours if you were given a sedative during your procedure.  Ask your health care provider when it is safe to drive. General instructions  Do not use any products that contain nicotine or tobacco, such as cigarettes, e-cigarettes, and chewing tobacco. These can delay incision healing after surgery. If you need help quitting, ask your health care provider.  If you were given a scrotal support, wear it as told by your health care provider.  If you had a drain put  in during the procedure, you will need to have it removed at a follow-up visit.  Keep all follow-up visits as told by your health care provider. This is important. Contact a health care provider if:  Your pain is not controlled with medicine.  You have more redness, swelling, or pain around your scrotum.  You have fluid or blood coming from your incision.  Your incision feels warm to the touch.  You have pus or a bad smell coming from your scrotum.  You have a fever. Get help right away if:  You develop shaking, chills, and a fever that is higher than 101.64F (38.8C).  You have redness or swelling that starts at your scrotum and spreads outward to cover your whole groin.  You develop swelling of the legs or difficulty breathing. Summary  After a hydrocelectomy, it is common to have mild discomfort, swelling, and bruising.  Do not take baths, swim, or use a hot tub until your health care provider approves. Ask your health care provider if you may take showers.  If directed, put ice on the affected area to help with pain and swelling.  Do not do any high-energy activities or lift anything heavier than 10 lb (4.5 kg) for as long as told by your health care provider.  If you were given a scrotal support, keep it dry. Wear the scrotal support as told by your health care provider. This information is not intended to replace advice given to you by your health care provider. Make sure you discuss any questions you have with your health care provider. Document Revised: 10/31/2018 Document Reviewed: 10/31/2018 Elsevier Patient Education  2020 [employer]  General Anesthesia, Adult, Care After This sheet gives you information about how to care for yourself after your procedure. Your health care provider may also give you more specific instructions. If you have problems or questions, contact your health care provider. What can I expect after the procedure? After the procedure, the  following side effects are common:  Pain or discomfort at the IV site.  Nausea.  Vomiting.  Sore throat.  Trouble concentrating.  Feeling cold or chills.  Weak or tired.  Sleepiness and fatigue.  Soreness and  body aches. These side effects can affect parts of the body that were not involved in surgery. Follow these instructions at home:  For at least 24 hours after the procedure:  Have a responsible adult stay with you. It is important to have someone help care for you until you are awake and alert.  Rest as needed.  Do not: ? Participate in activities in which you could fall or become injured. ? Drive. ? Use heavy machinery. ? Drink alcohol. ? Take sleeping pills or medicines that cause drowsiness. ? Make important decisions or sign legal documents. ? Take care of children on your own. Eating and drinking  Follow any instructions from your health care provider about eating or drinking restrictions.  When you feel hungry, start by eating small amounts of foods that are soft and easy to digest (bland), such as toast. Gradually return to your regular diet.  Drink enough fluid to keep your urine pale yellow.  If you vomit, rehydrate by drinking water, juice, or clear broth. General instructions  If you have sleep apnea, surgery and certain medicines can increase your risk for breathing problems. Follow instructions from your health care provider about wearing your sleep device: ? Anytime you are sleeping, including during daytime naps. ? While taking prescription pain medicines, sleeping medicines, or medicines that make you drowsy.  Return to your normal activities as told by your health care provider. Ask your health care provider what activities are safe for you.  Take over-the-counter and prescription medicines only as told by your health care provider.  If you smoke, do not smoke without supervision.  Keep all follow-up visits as told by your health care  provider. This is important. Contact a health care provider if:  You have nausea or vomiting that does not get better with medicine.  You cannot eat or drink without vomiting.  You have pain that does not get better with medicine.  You are unable to pass urine.  You develop a skin rash.  You have a fever.  You have redness around your IV site that gets worse. Get help right away if:  You have difficulty breathing.  You have chest pain.  You have blood in your urine or stool, or you vomit blood. Summary  After the procedure, it is common to have a sore throat or nausea. It is also common to feel tired.  Have a responsible adult stay with you for the first 24 hours after general anesthesia. It is important to have someone help care for you until you are awake and alert.  When you feel hungry, start by eating small amounts of foods that are soft and easy to digest (bland), such as toast. Gradually return to your regular diet.  Drink enough fluid to keep your urine pale yellow.  Return to your normal activities as told by your health care provider. Ask your health care provider what activities are safe for you. This information is not intended to replace advice given to you by your health care provider. Make sure you discuss any questions you have with your health care provider. Document Revised: 06/10/2017 Document Reviewed: 01/21/2017 Elsevier Patient Education  2020 [employer]. How to Use Chlorhexidine for Bathing Chlorhexidine gluconate (CHG) is a germ-killing (antiseptic) solution that is used to clean the skin. It can get rid of the bacteria that normally live on the skin and can keep them away for about 24 hours. To clean your skin with CHG, you may be given:  A CHG solution to use in the shower or as part of a sponge bath.  A prepackaged cloth that contains CHG. Cleaning your skin with CHG may help lower the risk for infection:  While you are staying in the  intensive care unit of the hospital.  If you have a vascular access, such as a central line, to provide short-term or long-term access to your veins.  If you have a catheter to drain urine from your bladder.  If you are on a ventilator. A ventilator is a machine that helps you breathe by moving air in and out of your lungs.  After surgery. What are the risks? Risks of using CHG include:  A skin reaction.  Hearing loss, if CHG gets in your ears.  Eye injury, if CHG gets in your eyes and is not rinsed out.  The CHG product catching fire. Make sure that you avoid smoking and flames after applying CHG to your skin. Do not use CHG:  If you have a chlorhexidine allergy or have previously reacted to chlorhexidine.  On babies younger than 25 months of age. How to use CHG solution  Use CHG only as told by your health care provider, and follow the instructions on the label.  Use the full amount of CHG as directed. Usually, this is one bottle. During a shower Follow these steps when using CHG solution during a shower (unless your health care provider gives you different instructions): 1. Start the shower. 2. Use your normal soap and shampoo to wash your face and hair. 3. Turn off the shower or move out of the shower stream. 4. Pour the CHG onto a clean washcloth. Do not use any type of brush or rough-edged sponge. 5. Starting at your neck, lather your body down to your toes. Make sure you follow these instructions: ? If you will be having surgery, pay special attention to the part of your body where you will be having surgery. Scrub this area for at least 1 minute. ? Do not use CHG on your head or face. If the solution gets into your ears or eyes, rinse them well with water. ? Avoid your genital area. ? Avoid any areas of skin that have broken skin, cuts, or scrapes. ? Scrub your back and under your arms. Make sure to wash skin folds. 6. Let the lather sit on your skin for 1-2 minutes  or as long as told by your health care provider. 7. Thoroughly rinse your entire body in the shower. Make sure that all body creases and crevices are rinsed well. 8. Dry off with a clean towel. Do not put any substances on your body afterward--such as powder, lotion, or perfume--unless you are told to do so by your health care provider. Only use lotions that are recommended by the manufacturer. 9. Put on clean clothes or pajamas. 10. If it is the night before your surgery, sleep in clean sheets.  During a sponge bath Follow these steps when using CHG solution during a sponge bath (unless your health care provider gives you different instructions): 1. Use your normal soap and shampoo to wash your face and hair. 2. Pour the CHG onto a clean washcloth. 3. Starting at your neck, lather your body down to your toes. Make sure you follow these instructions: ? If you will be having surgery, pay special attention to the part of your body where you will be having surgery. Scrub this area for at least 1 minute. ? Do  not use CHG on your head or face. If the solution gets into your ears or eyes, rinse them well with water. ? Avoid your genital area. ? Avoid any areas of skin that have broken skin, cuts, or scrapes. ? Scrub your back and under your arms. Make sure to wash skin folds. 4. Let the lather sit on your skin for 1-2 minutes or as long as told by your health care provider. 5. Using a different clean, wet washcloth, thoroughly rinse your entire body. Make sure that all body creases and crevices are rinsed well. 6. Dry off with a clean towel. Do not put any substances on your body afterward--such as powder, lotion, or perfume--unless you are told to do so by your health care provider. Only use lotions that are recommended by the manufacturer. 7. Put on clean clothes or pajamas. 8. If it is the night before your surgery, sleep in clean sheets. How to use CHG prepackaged cloths  Only use CHG cloths as  told by your health care provider, and follow the instructions on the label.  Use the CHG cloth on clean, dry skin.  Do not use the CHG cloth on your head or face unless your health care provider tells you to.  When washing with the CHG cloth: ? Avoid your genital area. ? Avoid any areas of skin that have broken skin, cuts, or scrapes. Before surgery Follow these steps when using a CHG cloth to clean before surgery (unless your health care provider gives you different instructions): 1. Using the CHG cloth, vigorously scrub the part of your body where you will be having surgery. Scrub using a back-and-forth motion for 3 minutes. The area on your body should be completely wet with CHG when you are done scrubbing. 2. Do not rinse. Discard the cloth and let the area air-dry. Do not put any substances on the area afterward, such as powder, lotion, or perfume. 3. Put on clean clothes or pajamas. 4. If it is the night before your surgery, sleep in clean sheets.  For general bathing Follow these steps when using CHG cloths for general bathing (unless your health care provider gives you different instructions). 1. Use a separate CHG cloth for each area of your body. Make sure you wash between any folds of skin and between your fingers and toes. Wash your body in the following order, switching to a new cloth after each step: ? The front of your neck, shoulders, and chest. ? Both of your arms, under your arms, and your hands. ? Your stomach and groin area, avoiding the genitals. ? Your right leg and foot. ? Your left leg and foot. ? The back of your neck, your back, and your buttocks. 2. Do not rinse. Discard the cloth and let the area air-dry. Do not put any substances on your body afterward--such as powder, lotion, or perfume--unless you are told to do so by your health care provider. Only use lotions that are recommended by the manufacturer. 3. Put on clean clothes or pajamas. Contact a health  care provider if:  Your skin gets irritated after scrubbing.  You have questions about using your solution or cloth. Get help right away if:  Your eyes become very red or swollen.  Your eyes itch badly.  Your skin itches badly and is red or swollen.  Your hearing changes.  You have trouble seeing.  You have swelling or tingling in your mouth or throat.  You have trouble breathing.  You  swallow any chlorhexidine. Summary  Chlorhexidine gluconate (CHG) is a germ-killing (antiseptic) solution that is used to clean the skin. Cleaning your skin with CHG may help to lower your risk for infection.  You may be given CHG to use for bathing. It may be in a bottle or in a prepackaged cloth to use on your skin. Carefully follow your health care provider's instructions and the instructions on the product label.  Do not use CHG if you have a chlorhexidine allergy.  Contact your health care provider if your skin gets irritated after scrubbing. This information is not intended to replace advice given to you by your health care provider. Make sure you discuss any questions you have with your health care provider. Document Revised: 08/24/2018 Document Reviewed: 05/05/2017 Elsevier Patient Education  [place].

## 2019-11-15 ENCOUNTER — Other Ambulatory Visit (HOSPITAL_COMMUNITY): Payer: Self-pay

## 2019-11-16 ENCOUNTER — Encounter (HOSPITAL_COMMUNITY)
Admission: RE | Admit: 2019-11-16 | Discharge: 2019-11-16 | Disposition: A | Discharge status: Home / Self Care | Payer: Self-pay | Source: Ambulatory Visit | Attending: Urology | Admitting: Urology

## 2019-11-16 ENCOUNTER — Encounter (HOSPITAL_COMMUNITY): Payer: Self-pay

## 2019-11-16 ENCOUNTER — Other Ambulatory Visit: Payer: Self-pay

## 2019-11-16 DIAGNOSIS — Z01818 Encounter for other preprocedural examination: Secondary | ICD-10-CM | POA: Insufficient documentation

## 2019-11-16 HISTORY — DX: Hydrocele, unspecified: N43.3

## 2019-11-16 HISTORY — DX: Unilateral inguinal hernia, without obstruction or gangrene, not specified as recurrent: K40.90

## 2019-11-16 LAB — CBC WITH DIFFERENTIAL/PLATELET
Abs Immature Granulocytes: 0.02 10*3/uL (ref 0.00–0.07)
Basophils Absolute: 0 10*3/uL (ref 0.0–0.1)
Basophils Relative: 0 %
Eosinophils Absolute: 0.2 10*3/uL (ref 0.0–0.5)
Eosinophils Relative: 3 %
HCT: 46.4 % (ref 39.0–52.0)
Hemoglobin: 15.9 g/dL (ref 13.0–17.0)
Immature Granulocytes: 0 %
Lymphocytes Relative: 38 %
Lymphs Abs: 2.5 10*3/uL (ref 0.7–4.0)
MCH: 33.1 pg (ref 26.0–34.0)
MCHC: 34.3 g/dL (ref 30.0–36.0)
MCV: 96.5 fL (ref 80.0–100.0)
Monocytes Absolute: 0.7 10*3/uL (ref 0.1–1.0)
Monocytes Relative: 10 %
Neutro Abs: 3.1 10*3/uL (ref 1.7–7.7)
Neutrophils Relative %: 49 %
Platelets: 242 10*3/uL (ref 150–400)
RBC: 4.81 MIL/uL (ref 4.22–5.81)
RDW: 12.6 % (ref 11.5–15.5)
WBC: 6.5 10*3/uL (ref 4.0–10.5)
nRBC: 0 % (ref 0.0–0.2)

## 2019-11-16 LAB — BASIC METABOLIC PANEL
Anion gap: 8 (ref 5–15)
BUN: 26 mg/dL — ABNORMAL HIGH (ref 8–23)
CO2: 25 mmol/L (ref 22–32)
Calcium: 9.1 mg/dL (ref 8.9–10.3)
Chloride: 105 mmol/L (ref 98–111)
Creatinine, Ser: 1.31 mg/dL — ABNORMAL HIGH (ref 0.61–1.24)
GFR calc Af Amer: >60 mL/min (ref >60)
GFR calc non Af Amer: 58 mL/min — ABNORMAL LOW (ref >60)
Glucose, Bld: 84 mg/dL (ref 70–99)
Potassium: 4 mmol/L (ref 3.5–5.1)
Sodium: 138 mmol/L (ref 135–145)

## 2019-11-20 ENCOUNTER — Other Ambulatory Visit: Payer: Self-pay

## 2019-11-20 ENCOUNTER — Other Ambulatory Visit (HOSPITAL_COMMUNITY)
Admission: RE | Admit: 2019-11-20 | Discharge: 2019-11-20 | Disposition: A | Discharge status: Home / Self Care | Payer: HRSA Program | Source: Ambulatory Visit | Attending: Urology | Admitting: Urology

## 2019-11-20 DIAGNOSIS — Z01812 Encounter for preprocedural laboratory examination: Secondary | ICD-10-CM | POA: Diagnosis present

## 2019-11-20 DIAGNOSIS — Z20822 Contact with and (suspected) exposure to covid-19: Secondary | ICD-10-CM | POA: Insufficient documentation

## 2019-11-21 ENCOUNTER — Encounter (HOSPITAL_COMMUNITY): Payer: Self-pay | Admitting: Urology

## 2019-11-21 ENCOUNTER — Ambulatory Visit (HOSPITAL_COMMUNITY): Payer: Self-pay | Admitting: Anesthesiology

## 2019-11-21 ENCOUNTER — Encounter (HOSPITAL_COMMUNITY)
Admission: RE | Disposition: A | Discharge status: Home / Self Care | Payer: Self-pay | Source: Home / Self Care | Attending: Urology

## 2019-11-21 ENCOUNTER — Ambulatory Visit (HOSPITAL_COMMUNITY)
Admission: RE | Admit: 2019-11-21 | Discharge: 2019-11-21 | Disposition: A | Discharge status: Home / Self Care | Payer: Self-pay | Attending: Urology | Admitting: Urology

## 2019-11-21 DIAGNOSIS — N433 Hydrocele, unspecified: Secondary | ICD-10-CM

## 2019-11-21 DIAGNOSIS — I10 Essential (primary) hypertension: Secondary | ICD-10-CM | POA: Insufficient documentation

## 2019-11-21 DIAGNOSIS — G709 Myoneural disorder, unspecified: Secondary | ICD-10-CM | POA: Insufficient documentation

## 2019-11-21 DIAGNOSIS — K4090 Unilateral inguinal hernia, without obstruction or gangrene, not specified as recurrent: Secondary | ICD-10-CM | POA: Insufficient documentation

## 2019-11-21 DIAGNOSIS — Z79899 Other long term (current) drug therapy: Secondary | ICD-10-CM | POA: Insufficient documentation

## 2019-11-21 DIAGNOSIS — F17210 Nicotine dependence, cigarettes, uncomplicated: Secondary | ICD-10-CM | POA: Insufficient documentation

## 2019-11-21 HISTORY — PX: HYDROCELE EXCISION: SHX482

## 2019-11-21 HISTORY — PX: INGUINAL HERNIA REPAIR: SHX194

## 2019-11-21 LAB — SARS CORONAVIRUS 2 (TAT 6-24 HRS): SARS Coronavirus 2: NEGATIVE

## 2019-11-21 SURGERY — HYDROCELECTOMY
Anesthesia: General | Site: Scrotum | Laterality: Left

## 2019-11-21 MED ORDER — SUGAMMADEX SODIUM 500 MG/5ML IV SOLN
INTRAVENOUS | Status: DC | PRN
  Administered 2019-11-21: 200 mg via INTRAVENOUS

## 2019-11-21 MED ORDER — 0.9 % SODIUM CHLORIDE (POUR BTL) OPTIME
TOPICAL | Status: DC | PRN
  Administered 2019-11-21: 1000 mL

## 2019-11-21 MED ORDER — ROCURONIUM BROMIDE 10 MG/ML (PF) SYRINGE
PREFILLED_SYRINGE | INTRAVENOUS | Status: AC
  Filled 2019-11-21: qty 10

## 2019-11-21 MED ORDER — FENTANYL CITRATE (PF) 100 MCG/2ML IJ SOLN
INTRAMUSCULAR | Status: AC
  Filled 2019-11-21: qty 2

## 2019-11-21 MED ORDER — PROPOFOL 10 MG/ML IV BOLUS
INTRAVENOUS | Status: DC | PRN
  Administered 2019-11-21: 160 mg via INTRAVENOUS

## 2019-11-21 MED ORDER — BUPIVACAINE HCL (PF) 0.25 % IJ SOLN
INTRAMUSCULAR | Status: AC
  Filled 2019-11-21: qty 30

## 2019-11-21 MED ORDER — PROPOFOL 10 MG/ML IV BOLUS
INTRAVENOUS | Status: AC
  Filled 2019-11-21: qty 20

## 2019-11-21 MED ORDER — BUPIVACAINE HCL (PF) 0.25 % IJ SOLN
INTRAMUSCULAR | Status: DC | PRN
  Administered 2019-11-21: 10 mL

## 2019-11-21 MED ORDER — CEFAZOLIN SODIUM-DEXTROSE 2-4 GM/100ML-% IV SOLN
2.0000 g | INTRAVENOUS | Status: AC
  Administered 2019-11-21: 2 g via INTRAVENOUS

## 2019-11-21 MED ORDER — LIDOCAINE HCL (CARDIAC) PF 50 MG/5ML IV SOSY
PREFILLED_SYRINGE | INTRAVENOUS | Status: DC | PRN
  Administered 2019-11-21: 100 mg via INTRAVENOUS

## 2019-11-21 MED ORDER — LACTATED RINGERS IV SOLN
INTRAVENOUS | Status: DC | PRN
Start: 2019-11-21 — End: 2019-11-21

## 2019-11-21 MED ORDER — FENTANYL CITRATE (PF) 100 MCG/2ML IJ SOLN
INTRAMUSCULAR | Status: DC | PRN
  Administered 2019-11-21 (×2): 50 ug via INTRAVENOUS
  Administered 2019-11-21: 100 ug via INTRAVENOUS

## 2019-11-21 MED ORDER — PROMETHAZINE HCL 25 MG/ML IJ SOLN: 6.2500 mg | INTRAMUSCULAR | Status: DC | PRN

## 2019-11-21 MED ORDER — HYDROMORPHONE HCL 1 MG/ML IJ SOLN
0.2500 mg | INTRAMUSCULAR | Status: DC | PRN
  Administered 2019-11-21 (×3): 0.5 mg via INTRAVENOUS
  Filled 2019-11-21 (×3): qty 0.5

## 2019-11-21 MED ORDER — ROCURONIUM BROMIDE 100 MG/10ML IV SOLN
INTRAVENOUS | Status: DC | PRN
  Administered 2019-11-21: 50 mg via INTRAVENOUS

## 2019-11-21 MED ORDER — CHLORHEXIDINE GLUCONATE 0.12 % MT SOLN
15.0000 mL | Freq: Once | OROMUCOSAL | Status: AC
  Administered 2019-11-21: 15 mL via OROMUCOSAL

## 2019-11-21 MED ORDER — DEXAMETHASONE SODIUM PHOSPHATE 10 MG/ML IJ SOLN
INTRAMUSCULAR | Status: AC
  Filled 2019-11-21: qty 1

## 2019-11-21 MED ORDER — CHLORHEXIDINE GLUCONATE CLOTH 2 % EX PADS: 6.0000 | MEDICATED_PAD | Freq: Once | CUTANEOUS | Status: DC

## 2019-11-21 MED ORDER — ONDANSETRON HCL 4 MG/2ML IJ SOLN
INTRAMUSCULAR | Status: AC
  Filled 2019-11-21: qty 2

## 2019-11-21 MED ORDER — LIDOCAINE 2% (20 MG/ML) 5 ML SYRINGE
INTRAMUSCULAR | Status: AC
  Filled 2019-11-21: qty 5

## 2019-11-21 MED ORDER — DEXAMETHASONE SODIUM PHOSPHATE 10 MG/ML IJ SOLN
INTRAMUSCULAR | Status: DC | PRN
  Administered 2019-11-21: 8 mg via INTRAVENOUS

## 2019-11-21 MED ORDER — ONDANSETRON HCL 4 MG/2ML IJ SOLN
INTRAMUSCULAR | Status: DC | PRN
  Administered 2019-11-21: 4 mg via INTRAVENOUS

## 2019-11-21 MED ORDER — BUPIVACAINE LIPOSOME 1.3 % IJ SUSP
INTRAMUSCULAR | Status: DC | PRN
  Administered 2019-11-21: 16 mL

## 2019-11-21 MED ORDER — MEPERIDINE HCL 50 MG/ML IJ SOLN: 6.2500 mg | INTRAMUSCULAR | Status: DC | PRN

## 2019-11-21 MED ORDER — DEXMEDETOMIDINE HCL IN NACL 200 MCG/50ML IV SOLN
INTRAVENOUS | Status: DC | PRN
Start: 2019-11-21 — End: 2019-11-21
  Administered 2019-11-21: 20 ug via INTRAVENOUS

## 2019-11-21 MED ORDER — CEFAZOLIN SODIUM-DEXTROSE 2-4 GM/100ML-% IV SOLN
INTRAVENOUS | Status: AC
  Filled 2019-11-21: qty 100

## 2019-11-21 MED ORDER — KETOROLAC TROMETHAMINE 30 MG/ML IJ SOLN
30.0000 mg | Freq: Once | INTRAMUSCULAR | Status: AC
  Administered 2019-11-21: 30 mg via INTRAVENOUS
  Filled 2019-11-21: qty 1

## 2019-11-21 MED ORDER — OXYCODONE-ACETAMINOPHEN 7.5-325 MG PO TABS: 1.0000 | ORAL_TABLET | Freq: Four times a day (QID) | ORAL | 0 refills | Status: DC | PRN

## 2019-11-21 MED ORDER — CHLORHEXIDINE GLUCONATE 0.12 % MT SOLN
OROMUCOSAL | Status: AC
  Filled 2019-11-21: qty 15

## 2019-11-21 MED ORDER — ORAL CARE MOUTH RINSE: 15.0000 mL | Freq: Once | OROMUCOSAL | Status: AC

## 2019-11-21 MED ORDER — MIDAZOLAM HCL 2 MG/2ML IJ SOLN
INTRAMUSCULAR | Status: AC
  Filled 2019-11-21: qty 2

## 2019-11-21 MED ORDER — MIDAZOLAM HCL 2 MG/2ML IJ SOLN
INTRAMUSCULAR | Status: DC | PRN
  Administered 2019-11-21: 2 mg via INTRAVENOUS

## 2019-11-21 MED ORDER — LACTATED RINGERS IV SOLN
Freq: Once | INTRAVENOUS | Status: AC
  Administered 2019-11-21: 1000 mL via INTRAVENOUS

## 2019-11-21 MED ORDER — BUPIVACAINE LIPOSOME 1.3 % IJ SUSP
INTRAMUSCULAR | Status: AC
  Filled 2019-11-21: qty 10

## 2019-11-21 SURGICAL SUPPLY — 49 items
BLADE SURG 15 STRL LF DISP TIS (BLADE) ×2 IMPLANT
BLADE SURG 15 STRL SS (BLADE) ×2
CLOTH BEACON ORANGE TIMEOUT ST (SAFETY) ×4 IMPLANT
COVER LIGHT HANDLE STERIS (MISCELLANEOUS) ×12 IMPLANT
COVER WAND RF STERILE (DRAPES) ×6 IMPLANT
DERMABOND ADVANCED (GAUZE/BANDAGES/DRESSINGS) ×4
DERMABOND ADVANCED .7 DNX12 (GAUZE/BANDAGES/DRESSINGS) ×4 IMPLANT
DRAIN PENROSE 0.5X18 (DRAIN) ×4 IMPLANT
ELECT REM PT RETURN 9FT ADLT (ELECTROSURGICAL) ×4
ELECTRODE REM PT RTRN 9FT ADLT (ELECTROSURGICAL) ×4 IMPLANT
GAUZE SPONGE 4X4 12PLY STRL (GAUZE/BANDAGES/DRESSINGS) ×10 IMPLANT
GLOVE BIO SURGEON STRL SZ8 (GLOVE) ×4 IMPLANT
GLOVE BIOGEL PI IND STRL 7.0 (GLOVE) ×10 IMPLANT
GLOVE BIOGEL PI INDICATOR 7.0 (GLOVE) ×6
GLOVE ECLIPSE 6.5 STRL STRAW (GLOVE) ×4 IMPLANT
GLOVE SURG SS PI 7.5 STRL IVOR (GLOVE) ×4 IMPLANT
GOWN STRL REUS W/TWL LRG LVL3 (GOWN DISPOSABLE) ×12 IMPLANT
GOWN STRL REUS W/TWL XL LVL3 (GOWN DISPOSABLE) ×4 IMPLANT
INST SET MINOR GENERAL (KITS) ×4 IMPLANT
KIT TURNOVER KIT A (KITS) ×6 IMPLANT
MANIFOLD NEPTUNE II (INSTRUMENTS) ×6 IMPLANT
MESH HERNIA 1.6X1.9 PLUG LRG (Mesh General) IMPLANT
MESH HERNIA PLUG LRG (Mesh General) ×2 IMPLANT
NDL HYPO 18GX1.5 BLUNT FILL (NEEDLE) ×2 IMPLANT
NDL HYPO 25X1 1.5 SAFETY (NEEDLE) ×2 IMPLANT
NEEDLE HYPO 18GX1.5 BLUNT FILL (NEEDLE) ×4 IMPLANT
NEEDLE HYPO 22GX1.5 SAFETY (NEEDLE) ×4 IMPLANT
NEEDLE HYPO 25X1 1.5 SAFETY (NEEDLE) ×4 IMPLANT
NS IRRIG 1000ML POUR BTL (IV SOLUTION) ×6 IMPLANT
PACK MINOR (CUSTOM PROCEDURE TRAY) ×6 IMPLANT
PAD ARMBOARD 7.5X6 YLW CONV (MISCELLANEOUS) ×8 IMPLANT
PENCIL SMOKE EVACUATOR (MISCELLANEOUS) ×4 IMPLANT
SET BASIN LINEN APH (SET/KITS/TRAYS/PACK) ×6 IMPLANT
SOL PREP PROV IODINE SCRUB 4OZ (MISCELLANEOUS) ×4 IMPLANT
SUPPORT SCROTAL LG STRP (MISCELLANEOUS) ×3 IMPLANT
SUPPORTER ATHLETIC LG (MISCELLANEOUS) ×1
SUT MNCRL AB 4-0 PS2 18 (SUTURE) ×8 IMPLANT
SUT NOVA NAB GS-22 2 2-0 T-19 (SUTURE) ×10 IMPLANT
SUT SILK 3 0 (SUTURE)
SUT SILK 3-0 18XBRD TIE 12 (SUTURE) IMPLANT
SUT VIC AB 2-0 CT1 27 (SUTURE) ×2
SUT VIC AB 2-0 CT1 TAPERPNT 27 (SUTURE) ×2 IMPLANT
SUT VIC AB 3-0 SH 27 (SUTURE) ×6
SUT VIC AB 3-0 SH 27X BRD (SUTURE) ×4 IMPLANT
SUT VICRYL AB 2 0 TIES (SUTURE) IMPLANT
SUT VICRYL AB 3 0 TIES (SUTURE) ×2 IMPLANT
SYR 20ML LL LF (SYRINGE) ×8 IMPLANT
SYR CONTROL 10ML LL (SYRINGE) ×4 IMPLANT
YANKAUER SUCT 12FT TUBE ARGYLE (SUCTIONS) ×4 IMPLANT

## 2019-11-21 NOTE — Progress Notes (Signed)
11/21/2019 1306pm call from Patient and Mrs. Logan Brewer friend taking him home. "I need a work note".  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

## 2019-11-21 NOTE — Progress Notes (Signed)
Please excuse Logan Brewer from work on June 2,2021 through December 05, 2019.  He cannot drive, operate heavy machinery , or sign legal documents, or lift greater than ten pounds until after his final follow up appointment on December 05, 2019.

## 2019-11-21 NOTE — Anesthesia Preprocedure Evaluation (Addendum)
Anesthesia Evaluation  Patient identified by MRN, date of birth, ID band Patient awake    Reviewed: Allergy & Precautions, NPO status , Patient's Chart, lab work & pertinent test results  Airway Mallampati: II  TM Distance: >3 FB Neck ROM: Full    Dental  (+) Partial Upper, Missing, Dental Advisory Given   Pulmonary Current Smoker and Patient abstained from smoking.,    Pulmonary exam normal breath sounds clear to auscultation       Cardiovascular Exercise Tolerance: Good hypertension, Pt. on medications (-) anginaNormal cardiovascular exam Rhythm:Regular Rate:Normal  16-Nov-2019 15:39:48 [facility] ROUTINE RECORD Normal sinus rhythm Right atrial enlargement Left ventricular hypertrophy with repolarization abnormality ( Sokolow-Lyon , Romhilt-Estes ) Abnormal ECG No significant change since last tracing Confirmed by [place], Peter [phone]) on 11/16/2019 6:05:22 PM   Neuro/Psych Anxiety  Neuromuscular disease    GI/Hepatic negative GI ROS, (+)     substance abuse  marijuana use,   Endo/Other    Renal/GU      Musculoskeletal Back pain   Abdominal   Peds  Hematology   Anesthesia Other Findings   Reproductive/Obstetrics                           Anesthesia Physical Anesthesia Plan  ASA: II  Anesthesia Plan: General   Post-op Pain Management:    Induction:   PONV Risk Score and Plan: 4 or greater and Ondansetron, Midazolam and Dexamethasone  Airway Management Planned: Oral ETT  Additional Equipment:   Intra-op Plan:   Post-operative Plan:   Informed Consent: I have reviewed the patients History and Physical, chart, labs and discussed the procedure including the risks, benefits and alternatives for the proposed anesthesia with the patient or authorized representative who has indicated his/her understanding and acceptance.     Dental advisory given  Plan  Discussed with: CRNA and Surgeon  Anesthesia Plan Comments:         Anesthesia Quick Evaluation

## 2019-11-21 NOTE — Progress Notes (Signed)
Pt had a total of 1500 mL of lactated ringer's. 1400 mL in OR 100 mL in PACU

## 2019-11-21 NOTE — Op Note (Signed)
Patient:  Logan Brewer  DOB:  27-Aug-1955  MRN:  [id number]   Preop Diagnosis: Left inguinal hernia  Postop Diagnosis: Same  Procedure: Left inguinal herniorrhaphy with mesh  Surgeon: [name], MD  Anes: General  Indications: Patient is a 64 year old black male who presents with a symptomatic left inguinal hernia as well as bilateral hydroceles.  The patient will be undergoing hydrocelectomy by Dr. Ronne Binning of urology.  The risks and benefits of the surgery including bleeding, infection, mesh use, and the possibility of recurrence of the hernia were fully explained to the patient, who gave informed consent.  Procedure note: The patient was placed in supine position.  After general anesthesia was administered, the left groin region was prepped and draped using usual sterile technique with Betadine.  Surgical site confirmation was performed.  An incision was made in the left groin region down to the external oblique aponeurosis.  The aponeurosis was incised to the external ring.  A Penrose drain was placed around the spermatic cord.  The vase deferens was noted within the spermatic cord.  The patient had a large indirect hernia sac.  He also had a lipoma of the cord and a high ligation of this was performed using a 3-0 Vicryl tie.  The indirect hernia sac was freed away from the spermatic cord and inverted at the peritoneal reflection.  A large Bard PerFix plug was then inserted.  An onlay patch was placed along the floor of the anal canal and secured superiorly to the conjoined tendon and inferiorly to the shelving edge of Poupart's ligament using 2-0 Novafil interrupted sutures.  The internal ring was recreated using a 2-0 Novafil interrupted suture.  The external oblique aponeurosis was reapproximated using a 2-0 Vicryl running suture.  The subcutaneous layer was reapproximated using a 3-0 Vicryl interrupted suture.  Exparel was instilled into the surrounding wound.  The skin was closed  using a 4-0 Monocryl subcuticular suture.  Dermabond was to be applied after Dr. Ronne Binning had finished his portion of the procedure.  All tape and needle counts were correct at the end of the procedure.  Dr. Ronne Binning was then performing his portion of the procedure.  He will dictate that portion in a separate operative note.  Complications: None  EBL: Minimal  Specimen: None

## 2019-11-21 NOTE — Discharge Instructions (Signed)
PLEASE [facility] THE [place] [facility] UNTIL Sunday June 6TH, 2021, PLEASE DO NOT USE ANY FURTHER NUMBING MEDICATION UNLESS CONSULTING PHYSICIAN      General Anesthesia, Adult, Care After This sheet gives you information about how to care for yourself after your procedure. Your health care provider may also give you more specific instructions. If you have problems or questions, contact your health care provider. What can I expect after the procedure? After the procedure, the following side effects are common:  Pain or discomfort at the IV site.  Nausea.  Vomiting.  Sore throat.  Trouble concentrating.  Feeling cold or chills.  Weak or tired.  Sleepiness and fatigue.  Soreness and body aches. These side effects can affect parts of the body that were not involved in surgery. Follow these instructions at home:  For at least 24 hours after the procedure:  Have a responsible adult stay with you. It is important to have someone help care for you until you are awake and alert.  Rest as needed.  Do not: ? Participate in activities in which you could fall or become injured. ? Drive. ? Use heavy machinery. ? Drink alcohol. ? Take sleeping pills or medicines that cause drowsiness. ? Make important decisions or sign legal documents. ? Take care of children on your own. Eating and drinking  Follow any instructions from your health care provider about eating or drinking restrictions.  When you feel hungry, start by eating small amounts of foods that are soft and easy to digest (bland), such as toast. Gradually return to your regular diet.  Drink enough fluid to keep your urine pale yellow.  If you vomit, rehydrate by drinking water, juice, or clear broth. General instructions  If you have sleep apnea, surgery and certain medicines can increase your risk for breathing problems. Follow instructions from your health care provider about wearing your sleep device: ? Anytime  you are sleeping, including during daytime naps. ? While taking prescription pain medicines, sleeping medicines, or medicines that make you drowsy.  Return to your normal activities as told by your health care provider. Ask your health care provider what activities are safe for you.  Take over-the-counter and prescription medicines only as told by your health care provider.  If you smoke, do not smoke without supervision.  Keep all follow-up visits as told by your health care provider. This is important. Contact a health care provider if:  You have nausea or vomiting that does not get better with medicine.  You cannot eat or drink without vomiting.  You have pain that does not get better with medicine.  You are unable to pass urine.  You develop a skin rash.  You have a fever.  You have redness around your IV site that gets worse. Get help right away if:  You have difficulty breathing.  You have chest pain.  You have blood in your urine or stool, or you vomit blood. Summary  After the procedure, it is common to have a sore throat or nausea. It is also common to feel tired.  Have a responsible adult stay with you for the first 24 hours after general anesthesia. It is important to have someone help care for you until you are awake and alert.  When you feel hungry, start by eating small amounts of foods that are soft and easy to digest (bland), such as toast. Gradually return to your regular diet.  Drink enough fluid to keep your urine pale  yellow.  Return to your normal activities as told by your health care provider. Ask your health care provider what activities are safe for you. This information is not intended to replace advice given to you by your health care provider. Make sure you discuss any questions you have with your health care provider. Document Revised: 06/10/2017 Document Reviewed: 01/21/2017 Elsevier Patient Education  2020 Elsevier Inc.   Open Hernia  Repair, Adult, Care After This sheet gives you information about how to care for yourself after your procedure. Your health care provider may also give you more specific instructions. If you have problems or questions, contact your health care provider. What can I expect after the procedure? After the procedure, it is common to have:  Mild discomfort.  Slight bruising.  Minor swelling.  Pain in the abdomen. Follow these instructions at home: Incision care   Follow instructions from your health care provider about how to take care of your incision area. Make sure you: ? Wash your hands with soap and water before you change your bandage (dressing). If soap and water are not available, use hand sanitizer. ? Change your dressing as told by your health care provider. ? Leave stitches (sutures), skin glue, or adhesive strips in place. These skin closures may need to stay in place for 2 weeks or longer. If adhesive strip edges start to loosen and curl up, you may trim the loose edges. Do not remove adhesive strips completely unless your health care provider tells you to do that.  Check your incision area every day for signs of infection. Check for: ? More redness, swelling, or pain. ? More fluid or blood. ? Warmth. ? Pus or a bad smell. Activity  Do not drive or use heavy machinery while taking prescription pain medicine. Do not drive until your health care provider approves.  Until your health care provider approves: ? Do not lift anything that is heavier than 10 lb (4.5 kg). ? Do not play contact sports.  Return to your normal activities as told by your health care provider. Ask your health care provider what activities are safe. General instructions  To prevent or treat constipation while you are taking prescription pain medicine, your health care provider may recommend that you: ? Drink enough fluid to keep your urine clear or pale yellow. ? Take over-the-counter or prescription  medicines. ? Eat foods that are high in fiber, such as fresh fruits and vegetables, whole grains, and beans. ? Limit foods that are high in fat and processed sugars, such as fried and sweet foods.  Take over-the-counter and prescription medicines only as told by your health care provider.  Do not take tub baths or go swimming until your health care provider approves.  Keep all follow-up visits as told by your health care provider. This is important. Contact a health care provider if:  You develop a rash.  You have more redness, swelling, or pain around your incision.  You have more fluid or blood coming from your incision.  Your incision feels warm to the touch.  You have pus or a bad smell coming from your incision.  You have a fever or chills.  You have blood in your stool (feces).  You have not had a bowel movement in 2-3 days.  Your pain is not controlled with medicine. Get help right away if:  You have chest pain or shortness of breath.  You feel light-headed or feel faint.  You have severe pain.  You  vomit and your pain is worse. This information is not intended to replace advice given to you by your health care provider. Make sure you discuss any questions you have with your health care provider. Document Revised: 05/20/2017 Document Reviewed: 11/19/2015 Elsevier Patient Education  2020 Elsevier Inc.    Hydrocelectomy, Adult, Care After This sheet gives you information about how to care for yourself after your procedure. Your health care provider may also give you more specific instructions. If you have problems or questions, contact your health care provider. What can I expect after the procedure? After your procedure, it is common to have:  Mild discomfort and swelling in the pouch that holds your testicles (scrotum).  Bruising of the scrotum. Follow these instructions at home: Medicines  Take over-the-counter and prescription medicines only as told by  your health care provider.  Ask your health care provider if the medicine prescribed to you: ? Requires you to avoid driving or using heavy machinery. ? Can cause constipation. You may need to take these actions to prevent or treat constipation:  Drink enough fluid to keep your urine pale yellow.  Take over-the-counter or prescription medicines.  Eat foods that are high in fiber, such as beans, whole grains, and fresh fruits and vegetables.  Limit foods that are high in fat and processed sugars, such as fried or sweet foods. Bathing  Do not take baths, swim, or use a hot tub until your health care provider approves. Ask your health care provider if you may take showers. You may only be allowed to take sponge baths.  If you were told to wear an athletic support strap (scrotal support), keep it dry. Take it off when you shower or bathe. Incision care   Follow instructions from your health care provider about how to take care of your incision. Make sure you: ? Wash your hands with soap and water before and after you change your bandage (dressing). If soap and water are not available, use hand sanitizer. ? Change your dressing as told by your health care provider. ? Leave stitches (sutures), skin glue, or adhesive strips in place. These skin closures may need to stay in place for 2 weeks or longer. If adhesive strip edges start to loosen and curl up, you may trim the loose edges. Do not remove adhesive strips completely unless your health care provider tells you to do that.  Check your incision and scrotum every day for signs of infection. Check for: ? More redness, swelling, or pain. ? Fluid or blood. ? Warmth. ? Pus or a bad smell. Managing pain and swelling If directed, put ice on the affected area. To do this:  Put ice in a plastic bag.  Place a towel between your skin and the bag.  Leave the ice on for 20 minutes, 2-3 times per day.  Activity  Do not do any high-energy  activities for as long as told by your health care provider.  Do not lift anything that is heavier than 10 lb (4.5 kg), or the limit that you are told, until your health care provider says that it is safe.  Return to your normal activities as told by your health care provider. Ask your health care provider what activities are safe for you.  Do not drive for 24 hours if you were given a sedative during your procedure.  Ask your health care provider when it is safe to drive. General instructions  Do not use any products that contain nicotine  or tobacco, such as cigarettes, e-cigarettes, and chewing tobacco. These can delay incision healing after surgery. If you need help quitting, ask your health care provider.  If you were given a scrotal support, wear it as told by your health care provider.  If you had a drain put in during the procedure, you will need to have it removed at a follow-up visit.  Keep all follow-up visits as told by your health care provider. This is important. Contact a health care provider if:  Your pain is not controlled with medicine.  You have more redness, swelling, or pain around your scrotum.  You have fluid or blood coming from your incision.  Your incision feels warm to the touch.  You have pus or a bad smell coming from your scrotum.  You have a fever. Get help right away if:  You develop shaking, chills, and a fever that is higher than 101.55F (38.8C).  You have redness or swelling that starts at your scrotum and spreads outward to cover your whole groin.  You develop swelling of the legs or difficulty breathing. Summary  After a hydrocelectomy, it is common to have mild discomfort, swelling, and bruising.  Do not take baths, swim, or use a hot tub until your health care provider approves. Ask your health care provider if you may take showers.  If directed, put ice on the affected area to help with pain and swelling.  Do not do any high-energy  activities or lift anything heavier than 10 lb (4.5 kg) for as long as told by your health care provider.  If you were given a scrotal support, keep it dry. Wear the scrotal support as told by your health care provider. This information is not intended to replace advice given to you by your health care provider. Make sure you discuss any questions you have with your health care provider. Document Revised: 10/31/2018 Document Reviewed: 10/31/2018 Elsevier Patient Education  [place].

## 2019-11-21 NOTE — Anesthesia Procedure Notes (Signed)
Procedure Name: Intubation Performed by: Brynda Peon, CRNA Pre-anesthesia Checklist: Patient identified, Emergency Drugs available, Suction available, Patient being monitored and Timeout performed Patient Re-evaluated:Patient Re-evaluated prior to induction Oxygen Delivery Method: Circle system utilized Preoxygenation: Pre-oxygenation with 100% oxygen Induction Type: IV induction Ventilation: Mask ventilation without difficulty Laryngoscope Size: Miller and 2 Grade View: Grade I Tube type: Oral Tube size: 8.0 mm Number of attempts: 1 Airway Equipment and Method: Stylet Placement Confirmation: ETT inserted through vocal cords under direct vision,  positive ETCO2,  CO2 detector and breath sounds checked- equal and bilateral Secured at: 23 cm Tube secured with: Tape Dental Injury: Teeth and Oropharynx as per pre-operative assessment

## 2019-11-21 NOTE — Anesthesia Postprocedure Evaluation (Signed)
Anesthesia Post Note  Patient: Logan Brewer  Procedure(s) Performed: HYDROCELECTOMY ADULT (Bilateral Scrotum) HERNIA REPAIR INGUINAL ADULT (Left Inguinal)  Patient location during evaluation: PACU Anesthesia Type: General Level of consciousness: awake, oriented, sedated and patient cooperative Pain management: pain level controlled Vital Signs Assessment: post-procedure vital signs reviewed and stable Respiratory status: spontaneous breathing, respiratory function stable and nonlabored ventilation Cardiovascular status: blood pressure returned to baseline and stable Postop Assessment: no headache and no backache Anesthetic complications: no     Last Vitals:  Vitals:   11/21/19 0651  BP: (!) 161/73  Pulse: 62  Resp: 17  Temp: 36.9 C  SpO2: 96%    Last Pain:  Vitals:   11/21/19 0651  TempSrc: Oral  PainSc: 0-No pain                 Brynda Peon

## 2019-11-21 NOTE — H&P (Signed)
Urology Admission H&P  Chief Complaint: bilateral hydrocelec  History of Present Illness: [name] is a 63yo here for bilateral hydrocelectomy. He has intermittent testis pain. He has an associated left inguinal hernia  Past Medical History:  Diagnosis Date  . Hydrocele in adult   . Hypertension   . Inguinal hernia    Past Surgical History:  Procedure Laterality Date  . APPENDECTOMY      Home Medications:  Current Facility-Administered Medications  Medication Dose Route Frequency Provider Last Rate Last Admin  . 0.9 % irrigation (POUR BTL)    PRN Malen Gauze, MD   1,000 mL at 11/21/19 0726  . ceFAZolin (ANCEF) IVPB 2g/100 mL premix  2 g Intravenous On Call to OR [name], MD      . Chlorhexidine Gluconate Cloth 2 % PADS 6 each  6 each Topical Once [name], MD       And  . Chlorhexidine Gluconate Cloth 2 % PADS 6 each  6 each Topical Once [name], MD       Allergies: No Known Allergies  History reviewed. No pertinent family history. Social History:  reports that he has been smoking cigarettes. He has been smoking about 0.50 packs per day. He has never used smokeless tobacco. He reports current drug use. Drug: Marijuana. He reports that he does not drink alcohol.  Review of Systems  All other systems reviewed and are negative.   Physical Exam:  Vital signs in last 24 hours: Temp:  [98.5 F (36.9 C)] 98.5 F (36.9 C) (06/02 0651) Pulse Rate:  [62] 62 (06/02 0651) Resp:  [17] 17 (06/02 0651) BP: (161)/(73) 161/73 (06/02 0651) SpO2:  [96 %] 96 % (06/02 2952) Physical Exam  Constitutional: He is oriented to person, place, and time. He appears well-developed and well-nourished.  HENT:  Head: Normocephalic and atraumatic.  Eyes: Pupils are equal, round, and reactive to light. EOM are normal.  Neck: No thyromegaly present.  Cardiovascular: Normal rate and regular rhythm.  Respiratory: Effort normal. No respiratory distress.  GI: Soft. He exhibits  no distension.  Musculoskeletal:        General: No edema. Normal range of motion.     Cervical back: Normal range of motion.  Neurological: He is alert and oriented to person, place, and time.  Skin: Skin is warm and dry.  Psychiatric: He has a normal mood and affect. His behavior is normal. Judgment and thought content normal.    Laboratory Data:  Results for orders placed or performed during the hospital encounter of 11/20/19 (from the past 24 hour(s))  SARS CORONAVIRUS 2 (TAT 6-24 HRS) Nasopharyngeal Nasopharyngeal Swab     Status: None   Collection Time: 11/20/19  8:07 AM   Specimen: Nasopharyngeal Swab  Result Value Ref Range   SARS Coronavirus 2 NEGATIVE NEGATIVE   Recent Results (from the past 240 hour(s))  SARS CORONAVIRUS 2 (TAT 6-24 HRS) Nasopharyngeal Nasopharyngeal Swab     Status: None   Collection Time: 11/20/19  8:07 AM   Specimen: Nasopharyngeal Swab  Result Value Ref Range Status   SARS Coronavirus 2 NEGATIVE NEGATIVE Final    Comment: (NOTE) SARS-CoV-2 target nucleic acids are NOT DETECTED. The SARS-CoV-2 RNA is generally detectable in upper and lower respiratory specimens during the acute phase of infection. Negative results do not preclude SARS-CoV-2 infection, do not rule out co-infections with other pathogens, and should not be used as the sole basis for treatment or other patient management decisions. Negative  results must be combined with clinical observations, patient history, and epidemiological information. The expected result is Negative. Fact Sheet for Patients: SugarRoll.be Fact Sheet for Healthcare Providers: https://www.woods-mathews.com/ This test is not yet approved or cleared by the [place] FDA and  has been authorized for detection and/or diagnosis of SARS-CoV-2 by FDA under an Emergency Use Authorization (EUA). This EUA will remain  in effect (meaning this test can be used) for the duration of  the COVID-19 declaration under Section 56 4(b)(1) of the Act, 21 U.S.C. section 360bbb-3(b)(1), unless the authorization is terminated or revoked sooner. Performed at [facility], [place] [address], [place], [place] [address]    Creatinine: Recent Labs    11/16/19 1543  CREATININE 1.31*   Baseline Creatinine: 1.3  Impression/Assessment:  63yo with bilateral hydroceles  Plan:  The risks/benefits/alternatives to bilateral hydrocelectomy was explained to the patient and he understands and wishes to proceed with surgery  Nicolette Bang 11/21/2019, 7:35 AM

## 2019-11-21 NOTE — Transfer of Care (Signed)
Immediate Anesthesia Transfer of Care Note  Patient: Logan Brewer  Procedure(s) Performed: HYDROCELECTOMY ADULT (Bilateral Scrotum) HERNIA REPAIR INGUINAL ADULT (Left Inguinal)  Patient Location: PACU  Anesthesia Type:General  Level of Consciousness: awake, alert , sedated and drowsy  Airway & Oxygen Therapy: Patient Spontanous Breathing and Patient connected to nasal cannula oxygen  Post-op Assessment: Report given to RN, Post -op Vital signs reviewed and stable and Patient moving all extremities  Post vital signs: Reviewed and stable  Last Vitals:  Vitals Value Taken Time  BP 173/95 11/21/19 0927  Temp    Pulse    Resp 12 11/21/19 0928  SpO2    Vitals shown include unvalidated device data.  Last Pain:  Vitals:   11/21/19 0651  TempSrc: Oral  PainSc: 0-No pain      Patients Stated Pain Goal: 5 (11/21/19 0757)  Complications: No apparent anesthesia complications

## 2019-11-21 NOTE — Interval H&P Note (Signed)
History and Physical Interval Note:  11/21/2019 6:55 AM  Logan Brewer  has presented today for surgery, with the diagnosis of Bilateral hydroceles, left inguinal hernia.  The various methods of treatment have been discussed with the patient and family. After consideration of risks, benefits and other options for treatment, the patient has consented to  Procedure(s): HYDROCELECTOMY ADULT (Bilateral) HERNIA REPAIR INGUINAL ADULT (Left) as a surgical intervention.  The patient's history has been reviewed, patient examined, no change in status, stable for surgery.  I have reviewed the patient's chart and labs.  Questions were answered to the patient's satisfaction.     [name]

## 2019-11-21 NOTE — Progress Notes (Signed)
1:15pm , November 21, 2019. Work note mailed to patient home address.

## 2019-11-21 NOTE — Op Note (Signed)
Preoperative diagnosis: bilateral Hydrocele  Postoperative diagnosis: Same  Procedure: 1. bilateral hydrocelectomy  Attending: Wilkie Aye, MD  Anesthesia: General  History of blood loss: Minimal  Antibiotics: ancef  Drains: none  Specimens: none   Findings: 10cm right hydrocele, 6cm left hydrocele  Indications: Patient is a 64 year old male with a history of  bilateral hydrocele that was growing in size and causing him pain with walking.  We discussed the treatment options including observation versus excision after discussing treatment options he proceed with excision.   Procedure in detail: Prior to procedure consent was obtained.  Patient was brought to the operating room and a brief timeout was done to ensure correct patient, correct procedure, correct site.  General anesthesia was administered and patient was placed in supine position.  His genitalia was then prepped and draped in usual sterile fashion.  A 3 cm incision was made in the right hemiscrotum.  We dissected down to the tunica and then incised the tunica. A large hydrocele was encountered and was drained. We then excised the hydrocele sac and then over sewed the edge with 2-0 Vicryl in a running fashion. We then excised the right appendix testis. Hemostasis was then obtained with electrocautery. We then closed the defect in the epididymis with 3-0 vicryl in a running fashion. We then returned the testis to the left hemiscrotum and closed the overlying dartos with 3-0 vicryl in a running fashion. The skin was then closed with 4-0 monocryl in a running fashion. We then turned our attention to the left side. A 3 cm incision was made in the left hemiscrotum.  We dissected down to the tunica and then incised the tunica. A large hydrocele was encountered and was drained. We then excised the hydrocele sac and then over sewed the edge with 2-0 Vicryl in a running fashion. We then excised the right appendix testis. Hemostasis was  then obtained with electrocautery. We then closed the defect in the epididymis with 3-0 vicryl in a running fashion. We then returned the testis to the left hemiscrotum and closed the overlying dartos with 3-0 vicryl in a running fashion. The skin was then closed with 4-0 monocryl in a running fashion. Dermabond was placed on the incisions.  A dressing was then applied to the incision.  We then placed a scrotal fluff and this then concluded the procedure which was well tolerated by the patient.  Complications: None  Condition: Stable, extubated, transferred to PACU.  Plan: Patient is to be discharged home.  He is to follow up in 2 weeks for wound check.

## 2019-11-23 ENCOUNTER — Telehealth: Payer: Self-pay

## 2019-11-23 NOTE — Telephone Encounter (Signed)
Pt never called back office today stating he could come to be evaluated  with post op issues.

## 2019-11-23 NOTE — Telephone Encounter (Signed)
After speaking with Dr. Ronne Binning, recommends pt to be seen today. Dr. Annabell Howells agreed for pt to come to office today. Called pt. Pt will call girlfriend to see if she can bring pt to office today.

## 2019-11-23 NOTE — Telephone Encounter (Signed)
Received at call from [facility] that pt was having post op issues. I called the pt. Pt reports " Im bleeding from my balls. The incision is open" pt denies any lifting or pulling. Dr. Ronne Binning paged. Awaiting him to return call. Pt is 2 days post op

## 2019-11-27 NOTE — Addendum Note (Signed)
Addendum  created 11/27/19 1222 by Moshe Salisbury, CRNA   Charge Capture section accepted

## 2019-11-29 ENCOUNTER — Ambulatory Visit (INDEPENDENT_AMBULATORY_CARE_PROVIDER_SITE_OTHER): Payer: Self-pay | Admitting: Primary Care

## 2019-11-29 ENCOUNTER — Other Ambulatory Visit: Payer: Self-pay

## 2019-11-29 ENCOUNTER — Telehealth (INDEPENDENT_AMBULATORY_CARE_PROVIDER_SITE_OTHER): Payer: Self-pay | Admitting: General Surgery

## 2019-11-29 ENCOUNTER — Encounter: Payer: Self-pay | Admitting: General Surgery

## 2019-11-29 DIAGNOSIS — Z09 Encounter for follow-up examination after completed treatment for conditions other than malignant neoplasm: Secondary | ICD-10-CM

## 2019-11-29 MED ORDER — OXYCODONE-ACETAMINOPHEN 7.5-325 MG PO TABS: 1.0000 | ORAL_TABLET | Freq: Four times a day (QID) | ORAL | 0 refills | Status: DC | PRN

## 2019-11-29 NOTE — Progress Notes (Signed)
Subjective:     Logan Brewer  Virtual visit performed.  Patient states he is still having some incisional pain at both areas.  The inguinal incision is healing well.  He has had some issues with his scrotal incisions for which he has been followed by urology.  He is seeing Dr. Ronne Brewer next week in his office.  He denies any fever or chills. Objective:    There were no vitals taken for this visit.  General:  alert, cooperative and no distress       Assessment:    Postop incisional pain.  Not unexpected.    Plan:   Percocet has been reordered for pain.  Follow-up with Dr. Ronne Brewer next week.  I told him Dr. Ronne Brewer will call me if there are any issues with the inguinal incision.

## 2019-12-03 ENCOUNTER — Ambulatory Visit (INDEPENDENT_AMBULATORY_CARE_PROVIDER_SITE_OTHER): Payer: Self-pay | Admitting: Primary Care

## 2019-12-05 ENCOUNTER — Ambulatory Visit (INDEPENDENT_AMBULATORY_CARE_PROVIDER_SITE_OTHER): Payer: Self-pay | Admitting: Urology

## 2019-12-05 ENCOUNTER — Encounter: Payer: Self-pay | Admitting: Urology

## 2019-12-05 ENCOUNTER — Other Ambulatory Visit: Payer: Self-pay

## 2019-12-05 VITALS — BP 161/81 | HR 73 | Temp 97.5°F | Ht 67.0 in | Wt 168.0 lb

## 2019-12-05 DIAGNOSIS — N432 Other hydrocele: Secondary | ICD-10-CM | POA: Insufficient documentation

## 2019-12-05 MED ORDER — SULFAMETHOXAZOLE-TRIMETHOPRIM 800-160 MG PO TABS
1.0000 | ORAL_TABLET | Freq: Two times a day (BID) | ORAL | 0 refills | Status: DC
Start: 2019-12-05 — End: 2020-04-07

## 2019-12-05 MED ORDER — BACITRACIN 500 UNIT/GM EX OINT
1.0000 | TOPICAL_OINTMENT | Freq: Two times a day (BID) | CUTANEOUS | 0 refills | Status: DC
Start: 2019-12-05 — End: 2020-04-07

## 2019-12-05 NOTE — Progress Notes (Signed)
12/05/2019 4:05 PM   Logan Brewer December 11, 1955 [id number]  Referring provider: Kerin Perna, NP [address] [place],  [place] [address]  Left scrotal pain  HPI: Logan Brewer is a 64yo here for followup after bilateral hydrocelectomy. He had drainage from his left scrotal incision for 1 week which has since resolved. No pain. No swelling. No purulent drainage   PMH: Past Medical History:  Diagnosis Date  . Hydrocele in adult   . Hypertension   . Inguinal hernia     Surgical History: Past Surgical History:  Procedure Laterality Date  . APPENDECTOMY    . HYDROCELE EXCISION Bilateral 11/21/2019   Procedure: HYDROCELECTOMY ADULT;  Surgeon: Cleon Gustin, MD;  Location: [facility];  Service: Urology;  Laterality: Bilateral;  [id number]  . INGUINAL HERNIA REPAIR Left 11/21/2019   Procedure: HERNIA REPAIR INGUINAL ADULT;  Surgeon: Aviva Signs, MD;  Location: [facility];  Service: General;  Laterality: Left;  [id number]    Home Medications:  Allergies as of 12/05/2019   No Known Allergies     Medication List       Accurate as of December 05, 2019  4:05 PM. If you have any questions, ask your nurse or doctor.        amLODipine 10 MG tablet Commonly known as: NORVASC Take 1 tablet (10 mg total) by mouth daily.   hydrochlorothiazide 25 MG tablet Commonly known as: HYDRODIURIL Take 1 tablet (25 mg total) by mouth daily.   ibuprofen 800 MG tablet Commonly known as: ADVIL Take 1 tablet (800 mg total) by mouth every 8 (eight) hours as needed for moderate pain.   nicotine 21 mg/24hr patch Commonly known as: Nicoderm CQ Place 1 patch (21 mg total) onto the skin daily.   nicotine 14 mg/24hr patch Commonly known as: Nicoderm CQ Place 1 patch (14 mg total) onto the skin daily.   nicotine 7 mg/24hr patch Commonly known as: Nicoderm CQ Place 1 patch (7 mg total) onto the skin daily.   oxyCODONE-acetaminophen 7.5-325 MG tablet Commonly known as: Percocet Take 1 tablet  by mouth every 6 (six) hours as needed.       Allergies: No Known Allergies  Family History: No family history on file.  Social History:  reports that he has been smoking cigarettes. He has been smoking about 0.50 packs per day. He has never used smokeless tobacco. He reports current drug use. Drug: Marijuana. He reports that he does not drink alcohol.  ROS: All other review of systems were reviewed and are negative except what is noted above in HPI  Physical Exam: BP (!) 161/81   Pulse 73   Temp (!) 97.5 F (36.4 C)   Ht 5\' 7"  (1.702 m)   Wt 168 lb (76.2 kg)   BMI 26.31 kg/m   Constitutional:  Alert and oriented, No acute distress. HEENT: [place] AT, moist mucus membranes.  Trachea midline, no masses. Cardiovascular: No clubbing, cyanosis, or edema. Respiratory: Normal respiratory effort, no increased work of breathing. GI: Abdomen is soft, nontender, nondistended, no abdominal masses GU: No CVA tenderness. Circumcised phallus. No masses/lesions on penis, testis, scrotum. Prostate 40g smooth no nodules no induration.  Lymph: No cervical or inguinal lymphadenopathy. Skin: No rashes, bruises or suspicious lesions. Neurologic: Grossly intact, no focal deficits, moving all 4 extremities. Psychiatric: Normal mood and affect.  Laboratory Data: Lab Results  Component Value Date   WBC 6.5 11/16/2019   HGB 15.9 11/16/2019   HCT 46.4 11/16/2019   MCV  96.5 11/16/2019   PLT 242 11/16/2019    Lab Results  Component Value Date   CREATININE 1.31 (H) 11/16/2019    No results found for: PSA  No results found for: TESTOSTERONE  No results found for: HGBA1C  Urinalysis    Component Value Date/Time   COLORURINE YELLOW 09/09/2019 1152   APPEARANCEUR CLEAR 09/09/2019 1152   LABSPEC 1.023 09/09/2019 1152   PHURINE 5.0 09/09/2019 1152   GLUCOSEU NEGATIVE 09/09/2019 1152   HGBUR NEGATIVE 09/09/2019 1152   BILIRUBINUR NEGATIVE 09/09/2019 1152   KETONESUR NEGATIVE 09/09/2019 1152     PROTEINUR NEGATIVE 09/09/2019 1152   UROBILINOGEN 1.0 04/25/2013 1237   NITRITE NEGATIVE 09/09/2019 1152   LEUKOCYTESUR NEGATIVE 09/09/2019 1152    Lab Results  Component Value Date   BACTERIA NONE SEEN 07/26/2018    Pertinent Imaging:  No results found for this or any previous visit.  No results found for this or any previous visit.  No results found for this or any previous visit.  No results found for this or any previous visit.  No results found for this or any previous visit.  No results found for this or any previous visit.  No results found for this or any previous visit.  No results found for this or any previous visit.   Assessment & Plan:    1. Other hydrocele -bacitracin BID to incision. RTC 2 weeks for check   No follow-ups on file.  Wilkie Aye, MD  [facility] [place]

## 2019-12-05 NOTE — Progress Notes (Signed)
Urological Symptom Review  Patient is experiencing the following symptoms: None   Review of Systems  Gastrointestinal (upper)  : Negative for upper GI symptoms  Gastrointestinal (lower) : Constipation  Constitutional : Negative for symptoms  Skin: Negative for skin symptoms  Eyes: Negative for eye symptoms  Ear/Nose/Throat : Negative for Ear/Nose/Throat symptoms  Hematologic/Lymphatic: Negative for Hematologic/Lymphatic symptoms  Cardiovascular : Negative for cardiovascular symptoms  Respiratory : Negative for respiratory symptoms  Endocrine: Negative for endocrine symptoms  Musculoskeletal: Back pain  Neurological: Negative for neurological symptoms  Psychologic: Negative for psychiatric symptoms

## 2019-12-05 NOTE — Patient Instructions (Signed)
Hydrocele, Adult A hydrocele is a collection of fluid in the loose pouch of skin that holds the testicles (scrotum). This may happen because:  The amount of fluid produced in the scrotum is not absorbed by the rest of the body.  Fluid from the abdomen fills the scrotum. Normally, the testicles develop in the abdomen then move (drop) into to the scrotum before birth. The tube that the testicles travel through usually closes after the testicles drop. If the tube does not close, fluid from the abdomen can fill the scrotum. This is less common in adults. What are the causes? The cause of a hydrocele in adults is usually not known. However, it may be caused by:  An injury to the scrotum.  An infection (epididymitis).  Decreased blood flow to the scrotum.  Twisting of a testicle (testicular torsion).  A birth defect.  A tumor or cancer of the testicle. What are the signs or symptoms? A hydrocele feels like a water-filled balloon. It may also feel heavy. Other symptoms include:  Swelling of the scrotum. The swelling may decrease when you lie down. You may also notice more swelling at night than in the morning.  Swelling of the groin.  Mild discomfort in the scrotum.  Pain. This can develop if the hydrocele was caused by infection or twisting. The larger the hydrocele, the more likely you are to have pain. How is this diagnosed? This condition may be diagnosed based on:  Physical exam.  Medical history. You may also have other tests, including:  Imaging tests, such as ultrasound.  Blood or urine tests. How is this treated? Most hydroceles go away on their own. If you have no discomfort or pain, your health care provider may suggest close monitoring of your condition (called watch and wait or watchful waiting) until the condition goes away or symptoms develop. If treatment is needed, it may include:  Treating an underlying condition. This may include using an antibiotic medicine to  treat an infection.  Surgery to stop fluid from collecting in the scrotum.  Surgery to drain the fluid. Options include: ? Needle aspiration. A needle is used to drain fluid. However, the fluid buildup will come back quickly. ? Hydrocelectomy. For this procedure, an incision is made in the scrotum to remove the fluid sac. Follow these instructions at home:  Watch the hydrocele for any changes.  Take over-the-counter and prescription medicines only as told by your health care provider.  If you were prescribed an antibiotic medicine, use it as told by your health care provider. Do not stop taking the antibiotic even if you start to feel better.  Keep all follow-up visits as told by your health care provider. This is important. Contact a health care provider if:  You notice any changes in the hydrocele.  The swelling in your scrotum or groin gets worse.  The hydrocele becomes red, firm, painful, or tender to the touch.  You have a fever. Get help right away if you:  Develop a lot of pain, or your pain becomes worse. Summary  A hydrocele is a collection of fluid in the loose pouch of skin that holds the testicles (scrotum).  Hydroceles can cause swelling, discomfort, and sometimes pain.  In adults, the cause of a hydrocele usually is not known. However, it is sometimes caused by an infection or a rotation and twisting of the scrotum.  Treatment is usually not needed. Hydroceles often go away on their own. If a hydrocele causes pain, treatment   may be given to ease the pain. This information is not intended to replace advice given to you by your health care provider. Make sure you discuss any questions you have with your health care provider. Document Revised: 06/18/2017 Document Reviewed: 06/18/2017 Elsevier Patient Education  2020 Elsevier Inc.  

## 2019-12-10 ENCOUNTER — Other Ambulatory Visit: Payer: Self-pay

## 2019-12-10 ENCOUNTER — Encounter (INDEPENDENT_AMBULATORY_CARE_PROVIDER_SITE_OTHER): Payer: Self-pay | Admitting: Primary Care

## 2019-12-10 ENCOUNTER — Ambulatory Visit (INDEPENDENT_AMBULATORY_CARE_PROVIDER_SITE_OTHER): Payer: Self-pay | Admitting: Primary Care

## 2019-12-10 VITALS — BP 152/82 | HR 96 | Temp 98.6°F | Ht 67.0 in | Wt 168.4 lb

## 2019-12-10 DIAGNOSIS — F17200 Nicotine dependence, unspecified, uncomplicated: Secondary | ICD-10-CM

## 2019-12-10 DIAGNOSIS — Z0130 Encounter for examination of blood pressure without abnormal findings: Secondary | ICD-10-CM

## 2019-12-10 NOTE — Patient Instructions (Signed)

## 2019-12-10 NOTE — Progress Notes (Signed)
[place] for  hypertension evaluation, on previous visit medication was not  Adjusted continued HCTZ 25mg  and amlodipine 10 mg he was out of medication resume. Elevated today but prior to appointment he had a cigarette. Dis not pick up Nicoderm patches cost $80 "BUCKS" Requesting percocet for his back explain did not prescribe he would need to follow up with urologist.   Patient reports adherence with medications.  Current Medication List Current Outpatient Medications on File Prior to Visit  Medication Sig Dispense Refill  . amLODipine (NORVASC) 10 MG tablet Take 1 tablet (10 mg total) by mouth daily. 90 tablet 3  . hydrochlorothiazide (HYDRODIURIL) 25 MG tablet Take 1 tablet (25 mg total) by mouth daily. 90 tablet 3  . bacitracin 500 UNIT/GM ointment Apply 1 application topically 2 (two) times daily. (Patient not taking: Reported on 12/10/2019) 15 g 0  . ibuprofen (ADVIL) 800 MG tablet Take 1 tablet (800 mg total) by mouth every 8 (eight) hours as needed for moderate pain. (Patient not taking: Reported on 12/10/2019) 90 tablet 1  . sulfamethoxazole-trimethoprim (BACTRIM DS) 800-160 MG tablet Take 1 tablet by mouth every 12 (twelve) hours. (Patient not taking: Reported on 12/10/2019) 14 tablet 0   No current facility-administered medications on file prior to visit.   Past Medical History   Dietary habits include: healthy diet  Exercise habits include: No  Family / Social history:No ASCVD risk factors include- [place]  O:  Physical Exam Vitals reviewed.  Cardiovascular:     Rate and Rhythm: Normal rate and regular rhythm.  Pulmonary:     Effort: Pulmonary effort is normal.     Breath sounds: Normal breath sounds.  Abdominal:     General: Bowel sounds are normal.  Musculoskeletal:        General: Tenderness present. Normal range of motion.     Cervical back: Normal range of motion.     Comments: Lower back   Skin:    General: Skin is warm and dry.   Neurological:     Mental Status: He is alert and oriented to person, place, and time.  Psychiatric:        Mood and Affect: Mood normal.        Behavior: Behavior normal.        Thought Content: Thought content normal.      Review of Systems  Musculoskeletal: Positive for back pain.       Worst with standing  All other systems reviewed and are negative.   Last 3 Office BP readings: BP Readings from Last 3 Encounters:  12/10/19 (!) 152/82  12/05/19 (!) 161/81  11/21/19 (!) 149/70    BMET    Component Value Date/Time   NA 138 11/16/2019 1543   K 4.0 11/16/2019 1543   CL 105 11/16/2019 1543   CO2 25 11/16/2019 1543   GLUCOSE 84 11/16/2019 1543   BUN 26 (H) 11/16/2019 1543   CREATININE 1.31 (H) 11/16/2019 1543   CALCIUM 9.1 11/16/2019 1543   GFRNONAA 58 (L) 11/16/2019 1543   GFRAA >60 11/16/2019 1543    Renal function: CrCl cannot be calculated (Patient's most recent lab result is older than the maximum 21 days allowed.).  Clinical ASCVD: Yes  The ASCVD Risk score Mikey Bussing DC Jr., et al., 2013) failed to calculate for the following reasons:   Cannot find a previous HDL lab   Cannot find a previous total cholesterol lab   A/P: Blood pressure check Hypertension longstanding  diagnosed currently HCTZ 25mg /amlodipine 10mg  daily on current medications. BP Goal = 130/80 mmHg. Patient is adherent with current medications.  -Continued  -F/u labs ordered -no-Counseled on lifestyle modifications for blood pressure control including reduced dietary sodium, increased exercise, adequate sleep  Tobacco dependence Discussed stopped smoking at each visit to encourage cessation which can also assist with lowering his Blood pressure he will try.   

## 2019-12-19 ENCOUNTER — Ambulatory Visit: Payer: Self-pay | Admitting: Urology

## 2019-12-31 ENCOUNTER — Telehealth: Payer: Self-pay | Admitting: Urology

## 2019-12-31 NOTE — Telephone Encounter (Signed)
Pt Lm  requesting a nurse call him back regarding swelling in testicles.

## 2019-12-31 NOTE — Telephone Encounter (Signed)
Reports increase swelling over last 4-5 days. Right side.  Denies fever.

## 2020-01-03 NOTE — Telephone Encounter (Signed)
Pt called and made aware of recommendations.  

## 2020-01-03 NOTE — Telephone Encounter (Signed)
Have him continue scrotal support. If the testis continues to increase in size I will see him in the office

## 2020-02-13 ENCOUNTER — Ambulatory Visit: Payer: Self-pay | Admitting: Urology

## 2020-02-22 ENCOUNTER — Ambulatory Visit (INDEPENDENT_AMBULATORY_CARE_PROVIDER_SITE_OTHER): Payer: Self-pay | Admitting: Urology

## 2020-02-22 ENCOUNTER — Encounter: Payer: Self-pay | Admitting: Urology

## 2020-02-22 ENCOUNTER — Other Ambulatory Visit: Payer: Self-pay

## 2020-02-22 VITALS — BP 143/80 | HR 76 | Temp 98.1°F | Ht 67.0 in | Wt 168.4 lb

## 2020-02-22 DIAGNOSIS — N5201 Erectile dysfunction due to arterial insufficiency: Secondary | ICD-10-CM

## 2020-02-22 DIAGNOSIS — N432 Other hydrocele: Secondary | ICD-10-CM

## 2020-02-22 LAB — URINALYSIS, ROUTINE W REFLEX MICROSCOPIC
Bilirubin, UA: NEGATIVE
Ketones, UA: NEGATIVE
Leukocytes,UA: NEGATIVE
Nitrite, UA: NEGATIVE
RBC, UA: NEGATIVE
Specific Gravity, UA: 1.025 (ref 1.005–1.030)
Urobilinogen, Ur: 0.2 mg/dL (ref 0.2–1.0)
pH, UA: 5.5 (ref 5.0–7.5)

## 2020-02-22 MED ORDER — SILDENAFIL CITRATE 100 MG PO TABS: 100.0000 mg | ORAL_TABLET | Freq: Every day | ORAL | 5 refills | Status: DC | PRN

## 2020-02-22 MED ORDER — DOXYCYCLINE HYCLATE 100 MG PO CAPS: 100.0000 mg | ORAL_CAPSULE | Freq: Two times a day (BID) | ORAL | 0 refills | Status: DC

## 2020-02-22 NOTE — Progress Notes (Signed)
02/22/2020 3:10 PM   Logan Brewer 1955-12-31 [id number]  Referring provider: Grayce Sessions, NP [address] [place],  [place] [address]  Right scrotal pain  HPI: Logan Brewer is a 64yo here for followup for bilateral hydroceles s/p bilateral hydrocelectomy. 1 month ago he developed new onset right scrotal swelling and mild to moderate scrotal pain. No denies any LUTS. The pain is sharp, constant nonradiating. No other associated symptoms. No excerbating/alleviaitng events. The patient has issues getting and erection and has taken Viagra 100mg  in the past with good results.    PMH: Past Medical History:  Diagnosis Date  . Hydrocele in adult   . Hypertension   . Inguinal hernia     Surgical History: Past Surgical History:  Procedure Laterality Date  . APPENDECTOMY    . HYDROCELE EXCISION Bilateral 11/21/2019   Procedure: HYDROCELECTOMY ADULT;  Surgeon: 01/21/2020, MD;  Location: [facility];  Service: Urology;  Laterality: Bilateral;  [place] Gauze  . INGUINAL HERNIA REPAIR Left 11/21/2019   Procedure: HERNIA REPAIR INGUINAL ADULT;  Surgeon: 01/21/2020, MD;  Location: [facility];  Service: General;  Laterality: LeftFranky Macho    Home Medications:  Allergies as of 02/22/2020   No Known Allergies     Medication List       Accurate as of February 22, 2020  3:10 PM. If you have any questions, ask your nurse or doctor.        amLODipine 10 MG tablet Commonly known as: NORVASC Take 1 tablet (10 mg total) by mouth daily.   bacitracin 500 UNIT/GM ointment Apply 1 application topically 2 (two) times daily.   doxycycline 100 MG capsule Commonly known as: VIBRAMYCIN Take 1 capsule (100 mg total) by mouth every 12 (twelve) hours. Started by: February 24, 2020, MD   hydrochlorothiazide 25 MG tablet Commonly known as: HYDRODIURIL Take 1 tablet (25 mg total) by mouth daily.   ibuprofen 800 MG tablet Commonly known as: ADVIL Take 1 tablet (800 mg total) by mouth  every 8 (eight) hours as needed for moderate pain.   sildenafil 100 MG tablet Commonly known as: VIAGRA Take 1 tablet (100 mg total) by mouth daily as needed for erectile dysfunction. Started by: Wilkie Aye, MD   sulfamethoxazole-trimethoprim 800-160 MG tablet Commonly known as: BACTRIM DS Take 1 tablet by mouth every 12 (twelve) hours.       Allergies: No Known Allergies  Family History: History reviewed. No pertinent family history.  Social History:  reports that he has been smoking cigarettes. He has been smoking about 0.50 packs per day. He has never used smokeless tobacco. He reports current drug use. Drug: Marijuana. He reports that he does not drink alcohol.  ROS: All other review of systems were reviewed and are negative except what is noted above in HPI  Physical Exam: BP (!) 143/80   Pulse 76   Temp 98.1 F (36.7 C)   Ht 5\' 7"  (1.702 m)   Wt 168 lb 6.4 oz (76.4 kg)   BMI 26.38 kg/m   Constitutional:  Alert and oriented, No acute distress. HEENT: [place] AT, moist mucus membranes.  Trachea midline, no masses. Cardiovascular: No clubbing, cyanosis, or edema. Respiratory: Normal respiratory effort, no increased work of breathing. GI: Abdomen is soft, nontender, nondistended, no abdominal masses GU: No CVA tenderness. Circumcised phallus. No masses/lesions on penis, testis, scrotum. Prostate 40g smooth no nodules no induration.  Lymph: No cervical or inguinal lymphadenopathy. Skin: No rashes, bruises or suspicious lesions. Neurologic:  Grossly intact, no focal deficits, moving all 4 extremities. Psychiatric: Normal mood and affect.  Laboratory Data: Lab Results  Component Value Date   WBC 6.5 11/16/2019   HGB 15.9 11/16/2019   HCT 46.4 11/16/2019   MCV 96.5 11/16/2019   PLT 242 11/16/2019    Lab Results  Component Value Date   CREATININE 1.31 (H) 11/16/2019    No results found for: PSA  No results found for: TESTOSTERONE  No results found for:  HGBA1C  Urinalysis    Component Value Date/Time   COLORURINE YELLOW 09/09/2019 1152   APPEARANCEUR CLEAR 09/09/2019 1152   LABSPEC 1.023 09/09/2019 1152   PHURINE 5.0 09/09/2019 1152   GLUCOSEU NEGATIVE 09/09/2019 1152   HGBUR NEGATIVE 09/09/2019 1152   BILIRUBINUR NEGATIVE 09/09/2019 1152   KETONESUR NEGATIVE 09/09/2019 1152   PROTEINUR NEGATIVE 09/09/2019 1152   UROBILINOGEN 1.0 04/25/2013 1237   NITRITE NEGATIVE 09/09/2019 1152   LEUKOCYTESUR NEGATIVE 09/09/2019 1152    Lab Results  Component Value Date   BACTERIA NONE SEEN 07/26/2018    Pertinent Imaging:  No results found for this or any previous visit.  No results found for this or any previous visit.  No results found for this or any previous visit.  No results found for this or any previous visit.  No results found for this or any previous visit.  No results found for this or any previous visit.  No results found for this or any previous visit.  No results found for this or any previous visit.   Assessment & Plan:    1. Other hydrocele -scrotal US - Urinalysis, Routine w reflex microscopic - US Scrotum  2. Erectile dysfunction -sildenafil 100mg  prn   Return in about 2 weeks (around 03/07/2020).  03/09/2020, MD  [facility] [place]

## 2020-02-22 NOTE — Progress Notes (Signed)
Urological Symptom Review  Patient is experiencing the following symptoms: Swollen testicles   Review of Systems  Gastrointestinal (upper)  : Negative for upper GI symptoms  Gastrointestinal (lower) : Negative for lower GI symptoms  Constitutional : Negative for symptoms  Skin: Negative for skin symptoms  Eyes: Negative for eye symptoms  Ear/Nose/Throat : Negative for Ear/Nose/Throat symptoms  Hematologic/Lymphatic: Negative for Hematologic/Lymphatic symptoms  Cardiovascular : Negative for cardiovascular symptoms  Respiratory : Negative for respiratory symptoms  Endocrine: Negative for endocrine symptoms  Musculoskeletal: Negative for musculoskeletal symptoms  Neurological: Negative for neurological symptoms  Psychologic: Negative for psychiatric symptoms

## 2020-02-26 DIAGNOSIS — N5201 Erectile dysfunction due to arterial insufficiency: Secondary | ICD-10-CM | POA: Insufficient documentation

## 2020-02-28 ENCOUNTER — Ambulatory Visit (HOSPITAL_COMMUNITY)
Admission: RE | Admit: 2020-02-28 | Discharge: 2020-02-28 | Disposition: A | Discharge status: Home / Self Care | Payer: Self-pay | Source: Ambulatory Visit | Attending: Urology | Admitting: Urology

## 2020-02-28 ENCOUNTER — Other Ambulatory Visit: Payer: Self-pay

## 2020-02-28 DIAGNOSIS — N432 Other hydrocele: Secondary | ICD-10-CM | POA: Insufficient documentation

## 2020-03-03 ENCOUNTER — Telehealth: Payer: Self-pay | Admitting: Primary Care

## 2020-03-03 NOTE — Telephone Encounter (Signed)
Copied from CRM [id number]. Topic: General - Other >> Mar 03, 2020 12:31 PM Marylen Ponto wrote: Reason for CRM: Pt stated he needs to speak with his doctor because he is at work and need his medications refilled. Pt stated he does not have the name of the medications because he is at work at the moment. Pt requests call back

## 2020-03-03 NOTE — Telephone Encounter (Signed)
Attempted to reach patient. He has a voicemail that is not setup yet.

## 2020-03-05 NOTE — Telephone Encounter (Signed)
Attempted to reach patient to inform that he needs to contact pharmacy for refills. Voicemail not setup.

## 2020-03-07 NOTE — Telephone Encounter (Signed)
Attempts to reach patient have been unsuccessful. Will have PCP discuss with patient at office visit on 9/21.

## 2020-03-11 ENCOUNTER — Ambulatory Visit (INDEPENDENT_AMBULATORY_CARE_PROVIDER_SITE_OTHER): Payer: Self-pay | Admitting: Primary Care

## 2020-03-12 ENCOUNTER — Ambulatory Visit (INDEPENDENT_AMBULATORY_CARE_PROVIDER_SITE_OTHER): Payer: Self-pay | Admitting: Urology

## 2020-03-12 ENCOUNTER — Other Ambulatory Visit: Payer: Self-pay

## 2020-03-12 ENCOUNTER — Encounter: Payer: Self-pay | Admitting: Urology

## 2020-03-12 VITALS — BP 191/78 | HR 71 | Temp 98.1°F | Ht 67.0 in | Wt 168.4 lb

## 2020-03-12 DIAGNOSIS — N432 Other hydrocele: Secondary | ICD-10-CM

## 2020-03-12 DIAGNOSIS — N5201 Erectile dysfunction due to arterial insufficiency: Secondary | ICD-10-CM

## 2020-03-12 MED ORDER — OXYCODONE-ACETAMINOPHEN 5-325 MG PO TABS: 1.0000 | ORAL_TABLET | ORAL | 0 refills | Status: DC | PRN

## 2020-03-12 NOTE — Progress Notes (Signed)
° °03/12/2020 °3:49 PM  ° °Logan Brewer °11/17/1955 °[id number] ° °Referring provider: Edwards, Michelle P, NP °[address] °[place],  [place] [address] ° °followup right scrotal swelling ° °HPI: °[name] is a 64yo here for followup for right scrotal swelling and erectile dysfunction. Sildenafil 100mg  Gave him a form erection. He continues to have significant right scrotal swelling. 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 ° ° °PMH: °Past Medical History:  °Diagnosis Date  °• Hydrocele in adult   °• Hypertension   °• Inguinal hernia   ° ° °Surgical History: °Past Surgical History:  °Procedure Laterality Date  °• APPENDECTOMY    °• HYDROCELE EXCISION Bilateral 11/21/2019  ° Procedure: HYDROCELECTOMY ADULT;  Surgeon: Mersadez Linden L, MD;  Location: [facility];  Service: Urology;  Laterality: Bilateral;  [id number]  °• INGUINAL HERNIA REPAIR Left 11/21/2019  ° Procedure: HERNIA REPAIR INGUINAL ADULT;  Surgeon: Jenkins, Mark, MD;  Location: [facility];  Service: General;  Laterality: Left;  [id number]  ° ° °Home Medications:  °Allergies as of 03/12/2020   °No Known Allergies °  °  °Medication List  °  °  ° Accurate as of March 12, 2020  3:49 PM. If you have any questions, ask your nurse or doctor.  °  °  °  °amLODipine 10 MG tablet °Commonly known as: NORVASC °Take 1 tablet (10 mg total) by mouth daily. °  °bacitracin 500 UNIT/GM ointment °Apply 1 application topically 2 (two) times daily. °  °doxycycline 100 MG capsule °Commonly known as: VIBRAMYCIN °Take 1 capsule (100 mg total) by mouth every 12 (twelve) hours. °  °hydrochlorothiazide 25 MG tablet °Commonly known as: HYDRODIURIL °Take 1 tablet (25 mg total) by mouth daily. °  °ibuprofen 800 MG tablet °Commonly known as: ADVIL °Take 1 tablet (800 mg total) by mouth every 8 (eight) hours as needed for moderate pain. °  °sildenafil 100 MG tablet °Commonly known as: VIAGRA °Take 1 tablet (100 mg total) by mouth daily as needed for erectile dysfunction. °    °sulfamethoxazole-trimethoprim 800-160 MG tablet °Commonly known as: BACTRIM DS °Take 1 tablet by mouth every 12 (twelve) hours. °  °  ° ° °Allergies: No Known Allergies ° °Family History: °No family history on file. ° °Social History:  reports that he has been smoking cigarettes. He has been smoking about 0.50 packs per day. He has never used smokeless tobacco. He reports current drug use. Drug: Marijuana. He reports that he does not drink alcohol. ° °ROS: °All other review of systems were reviewed and are negative except what is noted above in HPI ° °Physical Exam: °BP (!) 191/78    Pulse 71    Temp 98.1 °F (36.7 °C)    Ht 5' 7" (1.702 m)    Wt 168 lb 6.4 oz (76.4 kg)    BMI 26.38 kg/m²   °Constitutional:  Alert and oriented, No acute distress. °HEENT: [place] AT, moist mucus membranes.  Trachea midline, no masses. °Cardiovascular: No clubbing, cyanosis, or edema. °Respiratory: Normal respiratory effort, no increased work of breathing. °GI: Abdomen is soft, nontender, nondistended, no abdominal masses °GU: No CVA tenderness.  °Lymph: No cervical or inguinal lymphadenopathy. °Skin: No rashes, bruises or suspicious lesions. °Neurologic: Grossly intact, no focal deficits, moving all 4 extremities. °Psychiatric: Normal mood and affect. ° °Laboratory Data: °Lab Results  °Component Value Date  ° WBC 6.5 11/16/2019  ° HGB 15.9 11/16/2019  ° HCT 46.4 11/16/2019  ° MCV 96.5 11/16/2019  ° PLT 242   11/16/2019  ° ° °Lab Results  °Component Value Date  ° CREATININE 1.31 (H) 11/16/2019  ° ° °No results found for: PSA ° °No results found for: TESTOSTERONE ° °No results found for: HGBA1C ° °Urinalysis °   °Component Value Date/Time  ° COLORURINE YELLOW 09/09/2019 1152  ° APPEARANCEUR Clear 02/22/2020 1420  ° LABSPEC 1.023 09/09/2019 1152  ° PHURINE 5.0 09/09/2019 1152  ° GLUCOSEU Trace (A) 02/22/2020 1420  ° HGBUR NEGATIVE 09/09/2019 1152  ° BILIRUBINUR Negative 02/22/2020 1420  ° KETONESUR NEGATIVE 09/09/2019 1152  ° PROTEINUR Trace  (A) 02/22/2020 1420  ° PROTEINUR NEGATIVE 09/09/2019 1152  ° UROBILINOGEN 1.0 04/25/2013 1237  ° NITRITE Negative 02/22/2020 1420  ° NITRITE NEGATIVE 09/09/2019 1152  ° LEUKOCYTESUR Negative 02/22/2020 1420  ° LEUKOCYTESUR NEGATIVE 09/09/2019 1152  ° ° °Lab Results  °Component Value Date  ° LABMICR Comment 02/22/2020  ° BACTERIA NONE SEEN 07/26/2018  ° ° °Pertinent Imaging: °Scrotal US 02/28/2020: Images reviewed and discussed with the patient  °No results found for this or any previous visit. ° °No results found for this or any previous visit. ° °No results found for this or any previous visit. ° °No results found for this or any previous visit. ° °No results found for this or any previous visit. ° °No results found for this or any previous visit. ° °No results found for this or any previous visit. ° °No results found for this or any previous visit. ° ° °Assessment & Plan:   ° °1. Other hydrocele °-We discussed the management including observation, aspiration, and hydrocelectomy. After discussing the options the patient elects for hydrocelectomy. Risks/benefits/alternatives discussed ° °2. Erectile dysfunction due to arterial insufficiency °-continue sildenafil 100mg prn ° ° °No follow-ups on file. ° °Lipa Knauff, MD ° °[facility] ° °

## 2020-03-12 NOTE — H&P (View-Only) (Signed)
Urological Symptom Review  Patient is experiencing the following symptoms:    Review of Systems  Gastrointestinal (upper)  : Negative for upper GI symptoms  Gastrointestinal (lower) : Negative for lower GI symptoms  Constitutional : Negative for symptoms  Skin: Negative for skin symptoms  Eyes: Negative for eye symptoms  Ear/Nose/Throat : Negative for Ear/Nose/Throat symptoms  Hematologic/Lymphatic: Negative for Hematologic/Lymphatic symptoms  Cardiovascular : Negative for cardiovascular symptoms  Respiratory : Negative for respiratory symptoms  Endocrine: Negative for endocrine symptoms  Musculoskeletal: Negative for musculoskeletal symptoms  Neurological: Negative for neurological symptoms  Psychologic: Negative for psychiatric symptoms  

## 2020-03-12 NOTE — H&P (View-Only) (Signed)
03/12/2020 3:49 PM   Logan Brewer April 28, 1956 [id number]  Referring provider: Grayce Sessions, NP [address] [place],  [place] [address]  followup right scrotal swelling  HPI: [name] is a 64yo here for followup for right scrotal swelling and erectile dysfunction. Sildenafil 100mg   Gave him a form erection. He continues to have significant right scrotal swelling. SCrotal showed a recurrent right hydrocele   PMH: Past Medical History:  Diagnosis Date   Hydrocele in adult    Hypertension    Inguinal hernia     Surgical History: Past Surgical History:  Procedure Laterality Date   APPENDECTOMY     HYDROCELE EXCISION Bilateral 11/21/2019   Procedure: HYDROCELECTOMY ADULT;  Surgeon: 01/21/2020, MD;  Location: [facility];  Service: Urology;  Laterality: Bilateral;  Mal[place] Gauze   INGUINAL HERNIA REPAIR Left 11/21/2019   Procedure: HERNIA REPAIR INGUINAL ADULT;  Surgeon: 01/21/2020, MD;  Location: [facility];  Service: General;  Laterality: LeftFranky Brewer    Home Medications:  Allergies as of 03/12/2020   No Known Allergies     Medication List       Accurate as of March 12, 2020  3:49 PM. If you have any questions, ask your nurse or doctor.        amLODipine 10 MG tablet Commonly known as: NORVASC Take 1 tablet (10 mg total) by mouth daily.   bacitracin 500 UNIT/GM ointment Apply 1 application topically 2 (two) times daily.   doxycycline 100 MG capsule Commonly known as: VIBRAMYCIN Take 1 capsule (100 mg total) by mouth every 12 (twelve) hours.   hydrochlorothiazide 25 MG tablet Commonly known as: HYDRODIURIL Take 1 tablet (25 mg total) by mouth daily.   ibuprofen 800 MG tablet Commonly known as: ADVIL Take 1 tablet (800 mg total) by mouth every 8 (eight) hours as needed for moderate pain.   sildenafil 100 MG tablet Commonly known as: VIAGRA Take 1 tablet (100 mg total) by mouth daily as needed for erectile dysfunction.     sulfamethoxazole-trimethoprim 800-160 MG tablet Commonly known as: BACTRIM DS Take 1 tablet by mouth every 12 (twelve) hours.       Allergies: No Known Allergies  Family History: No family history on file.  Social History:  reports that he has been smoking cigarettes. He has been smoking about 0.50 packs per day. He has never used smokeless tobacco. He reports current drug use. Drug: Marijuana. He reports that he does not drink alcohol.  ROS: All other review of systems were reviewed and are negative except what is noted above in HPI  Physical Exam: BP (!) 191/78    Pulse 71    Temp 98.1 F (36.7 C)    Ht 5\' 7"  (1.702 m)    Wt 168 lb 6.4 oz (76.4 kg)    BMI 26.38 kg/m   Constitutional:  Alert and oriented, No acute distress. HEENT: [place] AT, moist mucus membranes.  Trachea midline, no masses. Cardiovascular: No clubbing, cyanosis, or edema. Respiratory: Normal respiratory effort, no increased work of breathing. GI: Abdomen is soft, nontender, nondistended, no abdominal masses GU: No CVA tenderness.  Lymph: No cervical or inguinal lymphadenopathy. Skin: No rashes, bruises or suspicious lesions. Neurologic: Grossly intact, no focal deficits, moving all 4 extremities. Psychiatric: Normal mood and affect.  Laboratory Data: Lab Results  Component Value Date   WBC 6.5 11/16/2019   HGB 15.9 11/16/2019   HCT 46.4 11/16/2019   MCV 96.5 11/16/2019   PLT 242  11/16/2019    Lab Results  Component Value Date   CREATININE 1.31 (H) 11/16/2019    No results found for: PSA  No results found for: TESTOSTERONE  No results found for: HGBA1C  Urinalysis    Component Value Date/Time   COLORURINE YELLOW 09/09/2019 1152   APPEARANCEUR Clear 02/22/2020 1420   LABSPEC 1.023 09/09/2019 1152   PHURINE 5.0 09/09/2019 1152   GLUCOSEU Trace (A) 02/22/2020 1420   HGBUR NEGATIVE 09/09/2019 1152   BILIRUBINUR Negative 02/22/2020 1420   KETONESUR NEGATIVE 09/09/2019 1152   PROTEINUR Trace  (A) 02/22/2020 1420   PROTEINUR NEGATIVE 09/09/2019 1152   UROBILINOGEN 1.0 04/25/2013 1237   NITRITE Negative 02/22/2020 1420   NITRITE NEGATIVE 09/09/2019 1152   LEUKOCYTESUR Negative 02/22/2020 1420   LEUKOCYTESUR NEGATIVE 09/09/2019 1152    Lab Results  Component Value Date   LABMICR Comment 02/22/2020   BACTERIA NONE SEEN 07/26/2018    Pertinent Imaging: Scrotal US 02/28/2020: Images reviewed and discussed with the patient  No results found for this or any previous visit.  No results found for this or any previous visit.  No results found for this or any previous visit.  No results found for this or any previous visit.  No results found for this or any previous visit.  No results found for this or any previous visit.  No results found for this or any previous visit.  No results found for this or any previous visit.   Assessment & Plan:    1. Other hydrocele -We discussed the management including observation, aspiration, and hydrocelectomy. After discussing the options the patient elects for hydrocelectomy. Risks/benefits/alternatives discussed  2. Erectile dysfunction due to arterial insufficiency -continue sildenafil 100mg  prn   No follow-ups on file.  , MD  [facility] [place]

## 2020-03-12 NOTE — Patient Instructions (Signed)
Hydrocelectomy, Adult  A hydrocelectomy is a surgical procedure to remove a collection of fluid (hydrocele) from the scrotum, which is the pouch that holds the testicles. You may need to have this procedure if a hydrocele is causing painful swelling in your scrotum. Tell a health care provider about:  Any allergies you have.  All medicines you are taking, including vitamins, herbs, eye drops, creams, and over-the-counter medicines.  Any problems you or family members have had with anesthetic medicines.  Any blood disorders you have.  Any surgeries you have had.  Any medical conditions you have. What are the risks? Generally, this is a safe procedure. However, problems may occur, including:  Bleeding into the scrotum (scrotal hematoma).  Damage to nearby structures or organs, including to the testicle or the tube that carries sperm out of the testicle (vas deferens).  Infection.  Allergic reactions to medicines. What happens before the procedure? Staying hydrated Follow instructions from your health care provider about hydration, which may include:  Up to 2 hours before the procedure - you may continue to drink clear liquids, such as water, clear fruit juice, black coffee, and plain tea. Eating and drinking restrictions Follow instructions from your health care provider about eating and drinking, which may include:  8 hours before the procedure - stop eating heavy meals or foods, such as meat, fried foods, or fatty foods.  6 hours before the procedure - stop eating light meals or foods, such as toast or cereal.  6 hours before the procedure - stop drinking milk or drinks that contain milk.  2 hours before the procedure - stop drinking clear liquids. Medicines Ask your health care provider about:  Changing or stopping your regular medicines. This is especially important if you are taking diabetes medicines or blood thinners.  Taking medicines such as aspirin and ibuprofen.  These medicines can thin your blood. Do not take these medicines unless your health care provider tells you to take them.  Taking over-the-counter medicines, vitamins, herbs, and supplements. General instructions  Do not use any products that contain nicotine or tobacco for at least 4 weeks before the procedure. These products include cigarettes, e-cigarettes, and chewing tobacco. If you need help quitting, ask your health care provider.  Plan to have someone take you home from the hospital or clinic.  Plan to have a responsible adult care for you for at least 24 hours after you leave the hospital or clinic. This is important.  Ask your health care provider: ? How your surgery site will be marked. ? What steps will be taken to help prevent infection. These may include:  Removing hair at the surgery site.  Washing skin with a germ-killing soap.  Taking antibiotic medicine. What happens during the procedure?  An IV will be inserted into one of your veins.  You will be given one or more of the following: ? A medicine to make you relax (sedative). ? A medicine to make you fall asleep (general anesthetic).  A small incision will be made through the skin of your scrotum.  Your testicle and the hydrocele will be located, and the hydrocele sac will be opened with an incision.  The fluid will be drained from the hydrocele. Part of the hydrocele sac may be removed.  The hydrocele will be closed with stitches that dissolve (absorbable sutures). This prevents fluid from building up again.  If your hydrocele is large, you may have a thin, rubber drain placed to allow fluid to drain  after the procedure.  The incision in your scrotum will be closed with absorbable sutures, skin glue, or adhesives.  A bandage (dressing) will be placed over the incision. The dressing may be held in place with an athletic support strap (scrotal support). The procedure may vary among health care providers and  hospitals. What happens after the procedure?   Your blood pressure, heart rate, breathing rate, and blood oxygen level will be monitored until you leave the hospital or clinic.  You will be given pain medicine as needed.  Your IV will be removed, and your insertion site will be checked for bleeding.  Do not drive for 24 hours if you were given a sedative during your procedure.  You may need to wear a scrotal support. This holds the dressing in place and supports your scrotum. Summary  A hydrocelectomy is a surgical procedure to remove a collection of fluid (hydrocele) from the scrotum, which is the pouch that holds the testicles. You may need to have this procedure if a hydrocele is causing painful swelling in your scrotum.  During the procedure, the hydrocele will be drained and then closed with stitches that dissolve (absorbable sutures). This prevents fluid from building up again.  If your hydrocele is large, you may have a thin, rubber drain placed to allow fluid to drain after the procedure.  You may need to wear a scrotal support after your procedure. This holds the dressing in place and supports your scrotum. This information is not intended to replace advice given to you by your health care provider. Make sure you discuss any questions you have with your health care provider. Document Revised: 10/31/2018 Document Reviewed: 10/31/2018 Elsevier Patient Education  2020 [employer].

## 2020-03-12 NOTE — Progress Notes (Signed)
Urological Symptom Review  Patient is experiencing the following symptoms:    Review of Systems  Gastrointestinal (upper)  : Negative for upper GI symptoms  Gastrointestinal (lower) : Negative for lower GI symptoms  Constitutional : Negative for symptoms  Skin: Negative for skin symptoms  Eyes: Negative for eye symptoms  Ear/Nose/Throat : Negative for Ear/Nose/Throat symptoms  Hematologic/Lymphatic: Negative for Hematologic/Lymphatic symptoms  Cardiovascular : Negative for cardiovascular symptoms  Respiratory : Negative for respiratory symptoms  Endocrine: Negative for endocrine symptoms  Musculoskeletal: Negative for musculoskeletal symptoms  Neurological: Negative for neurological symptoms  Psychologic: Negative for psychiatric symptoms  

## 2020-03-13 ENCOUNTER — Other Ambulatory Visit (INDEPENDENT_AMBULATORY_CARE_PROVIDER_SITE_OTHER): Payer: Self-pay | Admitting: Primary Care

## 2020-04-02 NOTE — Patient Instructions (Signed)
Logan Brewer  04/02/2020     @PREFPERIOPPHARMACY @   Your procedure is scheduled on  04/07/2020.  Report to 04/09/2020 at  1215  P.M.  Call this number if you have problems the morning of surgery:  [phone]   Remember:  Do not eat or drink after midnight.                        Take these medicines the morning of surgery with A SIP OF WATER  Amlodipine, oxycodone(if needed).    Do not wear jewelry, make-up or nail polish.  Do not wear lotions, powders, or perfume. Please wear deodorant and brush your teeth.  Do not shave 48 hours prior to surgery.  Men may shave face and neck.  Do not bring valuables to the hospital.  [facility] is not responsible for any belongings or valuables.  Contacts, dentures or bridgework may not be worn into surgery.  Leave your suitcase in the car.  After surgery it may be brought to your room.  For patients admitted to the hospital, discharge time will be determined by your treatment team.  Patients discharged the day of surgery will not be allowed to drive home.   Name and phone number of your driver:   family Special instructions:   DO NOT smoke the morning of your proceure.  Please read over the following fact sheets that you were given. Anesthesia Post-op Instructions and Care and Recovery After Surgery       Current Urology, 10(1), 1-14. [email or web address]">  Hydrocelectomy, Adult, Care After This sheet gives you information about how to care for yourself after your procedure. Your health care provider may also give you more specific instructions. If you have problems or questions, contact your health care provider. What can I expect after the procedure? After your procedure, it is common to have:  Mild discomfort and swelling in the pouch that holds your testicles (scrotum).  Bruising of the scrotum. Follow these instructions at home: Medicines  Take over-the-counter and prescription medicines  only as told by your health care provider.  Ask your health care provider if the medicine prescribed to you: ? Requires you to avoid driving or using heavy machinery. ? Can cause constipation. You may need to take these actions to prevent or treat constipation:  Drink enough fluid to keep your urine pale yellow.  Take over-the-counter or prescription medicines.  Eat foods that are high in fiber, such as beans, whole grains, and fresh fruits and vegetables.  Limit foods that are high in fat and processed sugars, such as fried or sweet foods. Bathing  Do not take baths, swim, or use a hot tub until your health care provider approves. Ask your health care provider if you may take showers. You may only be allowed to take sponge baths.  If you were told to wear an athletic support strap (scrotal support), keep it dry. Take it off when you shower or bathe. Incision care   Follow instructions from your health care provider about how to take care of your incision. Make sure you: ? Wash your hands with soap and water before and after you change your bandage (dressing). If soap and water are not available, use hand sanitizer. ? Change your dressing as told by your health care provider. ? Leave stitches (sutures), skin glue, or adhesive strips in place. These skin closures may need to stay in place for  2 weeks or longer. If adhesive strip edges start to loosen and curl up, you may trim the loose edges. Do not remove adhesive strips completely unless your health care provider tells you to do that.  Check your incision and scrotum every day for signs of infection. Check for: ? More redness, swelling, or pain. ? Fluid or blood. ? Warmth. ? Pus or a bad smell. Managing pain and swelling If directed, put ice on the affected area. To do this:  Put ice in a plastic bag.  Place a towel between your skin and the bag.  Leave the ice on for 20 minutes, 2-3 times per day.  Activity  Do not do any  high-energy activities for as long as told by your health care provider.  Do not lift anything that is heavier than 10 lb (4.5 kg), or the limit that you are told, until your health care provider says that it is safe.  Return to your normal activities as told by your health care provider. Ask your health care provider what activities are safe for you.  Do not drive for 24 hours if you were given a sedative during your procedure.  Ask your health care provider when it is safe to drive. General instructions  Do not use any products that contain nicotine or tobacco, such as cigarettes, e-cigarettes, and chewing tobacco. These can delay incision healing after surgery. If you need help quitting, ask your health care provider.  If you were given a scrotal support, wear it as told by your health care provider.  If you had a drain put in during the procedure, you will need to have it removed at a follow-up visit.  Keep all follow-up visits as told by your health care provider. This is important. Contact a health care provider if:  Your pain is not controlled with medicine.  You have more redness, swelling, or pain around your scrotum.  You have fluid or blood coming from your incision.  Your incision feels warm to the touch.  You have pus or a bad smell coming from your scrotum.  You have a fever. Get help right away if:  You develop shaking, chills, and a fever that is higher than 101.69F (38.8C).  You have redness or swelling that starts at your scrotum and spreads outward to cover your whole groin.  You develop swelling of the legs or difficulty breathing. Summary  After a hydrocelectomy, it is common to have mild discomfort, swelling, and bruising.  Do not take baths, swim, or use a hot tub until your health care provider approves. Ask your health care provider if you may take showers.  If directed, put ice on the affected area to help with pain and swelling.  Do not do any  high-energy activities or lift anything heavier than 10 lb (4.5 kg) for as long as told by your health care provider.  If you were given a scrotal support, keep it dry. Wear the scrotal support as told by your health care provider. This information is not intended to replace advice given to you by your health care provider. Make sure you discuss any questions you have with your health care provider. Document Revised: 10/31/2018 Document Reviewed: 10/31/2018 Elsevier Patient Education  2020 Elsevier Inc.  General Anesthesia, Adult, Care After This sheet gives you information about how to care for yourself after your procedure. Your health care provider may also give you more specific instructions. If you have problems or questions, contact your  health care provider. What can I expect after the procedure? After the procedure, the following side effects are common:  Pain or discomfort at the IV site.  Nausea.  Vomiting.  Sore throat.  Trouble concentrating.  Feeling cold or chills.  Weak or tired.  Sleepiness and fatigue.  Soreness and body aches. These side effects can affect parts of the body that were not involved in surgery. Follow these instructions at home:  For at least 24 hours after the procedure:  Have a responsible adult stay with you. It is important to have someone help care for you until you are awake and alert.  Rest as needed.  Do not: ? Participate in activities in which you could fall or become injured. ? Drive. ? Use heavy machinery. ? Drink alcohol. ? Take sleeping pills or medicines that cause drowsiness. ? Make important decisions or sign legal documents. ? Take care of children on your own. Eating and drinking  Follow any instructions from your health care provider about eating or drinking restrictions.  When you feel hungry, start by eating small amounts of foods that are soft and easy to digest (bland), such as toast. Gradually return to your  regular diet.  Drink enough fluid to keep your urine pale yellow.  If you vomit, rehydrate by drinking water, juice, or clear broth. General instructions  If you have sleep apnea, surgery and certain medicines can increase your risk for breathing problems. Follow instructions from your health care provider about wearing your sleep device: ? Anytime you are sleeping, including during daytime naps. ? While taking prescription pain medicines, sleeping medicines, or medicines that make you drowsy.  Return to your normal activities as told by your health care provider. Ask your health care provider what activities are safe for you.  Take over-the-counter and prescription medicines only as told by your health care provider.  If you smoke, do not smoke without supervision.  Keep all follow-up visits as told by your health care provider. This is important. Contact a health care provider if:  You have nausea or vomiting that does not get better with medicine.  You cannot eat or drink without vomiting.  You have pain that does not get better with medicine.  You are unable to pass urine.  You develop a skin rash.  You have a fever.  You have redness around your IV site that gets worse. Get help right away if:  You have difficulty breathing.  You have chest pain.  You have blood in your urine or stool, or you vomit blood. Summary  After the procedure, it is common to have a sore throat or nausea. It is also common to feel tired.  Have a responsible adult stay with you for the first 24 hours after general anesthesia. It is important to have someone help care for you until you are awake and alert.  When you feel hungry, start by eating small amounts of foods that are soft and easy to digest (bland), such as toast. Gradually return to your regular diet.  Drink enough fluid to keep your urine pale yellow.  Return to your normal activities as told by your health care provider. Ask  your health care provider what activities are safe for you. This information is not intended to replace advice given to you by your health care provider. Make sure you discuss any questions you have with your health care provider. Document Revised: 06/10/2017 Document Reviewed: 01/21/2017 Elsevier Patient Education  2020 Elsevier  Inc. How to Use Chlorhexidine for Bathing Chlorhexidine gluconate (CHG) is a germ-killing (antiseptic) solution that is used to clean the skin. It can get rid of the bacteria that normally live on the skin and can keep them away for about 24 hours. To clean your skin with CHG, you may be given:  A CHG solution to use in the shower or as part of a sponge bath.  A prepackaged cloth that contains CHG. Cleaning your skin with CHG may help lower the risk for infection:  While you are staying in the intensive care unit of the hospital.  If you have a vascular access, such as a central line, to provide short-term or long-term access to your veins.  If you have a catheter to drain urine from your bladder.  If you are on a ventilator. A ventilator is a machine that helps you breathe by moving air in and out of your lungs.  After surgery. What are the risks? Risks of using CHG include:  A skin reaction.  Hearing loss, if CHG gets in your ears.  Eye injury, if CHG gets in your eyes and is not rinsed out.  The CHG product catching fire. Make sure that you avoid smoking and flames after applying CHG to your skin. Do not use CHG:  If you have a chlorhexidine allergy or have previously reacted to chlorhexidine.  On babies younger than 33 months of age. How to use CHG solution  Use CHG only as told by your health care provider, and follow the instructions on the label.  Use the full amount of CHG as directed. Usually, this is one bottle. During a shower Follow these steps when using CHG solution during a shower (unless your health care provider gives you different  instructions): 1. Start the shower. 2. Use your normal soap and shampoo to wash your face and hair. 3. Turn off the shower or move out of the shower stream. 4. Pour the CHG onto a clean washcloth. Do not use any type of brush or rough-edged sponge. 5. Starting at your neck, lather your body down to your toes. Make sure you follow these instructions: ? If you will be having surgery, pay special attention to the part of your body where you will be having surgery. Scrub this area for at least 1 minute. ? Do not use CHG on your head or face. If the solution gets into your ears or eyes, rinse them well with water. ? Avoid your genital area. ? Avoid any areas of skin that have broken skin, cuts, or scrapes. ? Scrub your back and under your arms. Make sure to wash skin folds. 6. Let the lather sit on your skin for 1-2 minutes or as long as told by your health care provider. 7. Thoroughly rinse your entire body in the shower. Make sure that all body creases and crevices are rinsed well. 8. Dry off with a clean towel. Do not put any substances on your body afterward--such as powder, lotion, or perfume--unless you are told to do so by your health care provider. Only use lotions that are recommended by the manufacturer. 9. Put on clean clothes or pajamas. 10. If it is the night before your surgery, sleep in clean sheets.  During a sponge bath Follow these steps when using CHG solution during a sponge bath (unless your health care provider gives you different instructions): 1. Use your normal soap and shampoo to wash your face and hair. 2. Pour the CHG  onto a clean washcloth. 3. Starting at your neck, lather your body down to your toes. Make sure you follow these instructions: ? If you will be having surgery, pay special attention to the part of your body where you will be having surgery. Scrub this area for at least 1 minute. ? Do not use CHG on your head or face. If the solution gets into your ears or  eyes, rinse them well with water. ? Avoid your genital area. ? Avoid any areas of skin that have broken skin, cuts, or scrapes. ? Scrub your back and under your arms. Make sure to wash skin folds. 4. Let the lather sit on your skin for 1-2 minutes or as long as told by your health care provider. 5. Using a different clean, wet washcloth, thoroughly rinse your entire body. Make sure that all body creases and crevices are rinsed well. 6. Dry off with a clean towel. Do not put any substances on your body afterward--such as powder, lotion, or perfume--unless you are told to do so by your health care provider. Only use lotions that are recommended by the manufacturer. 7. Put on clean clothes or pajamas. 8. If it is the night before your surgery, sleep in clean sheets. How to use CHG prepackaged cloths  Only use CHG cloths as told by your health care provider, and follow the instructions on the label.  Use the CHG cloth on clean, dry skin.  Do not use the CHG cloth on your head or face unless your health care provider tells you to.  When washing with the CHG cloth: ? Avoid your genital area. ? Avoid any areas of skin that have broken skin, cuts, or scrapes. Before surgery Follow these steps when using a CHG cloth to clean before surgery (unless your health care provider gives you different instructions): 1. Using the CHG cloth, vigorously scrub the part of your body where you will be having surgery. Scrub using a back-and-forth motion for 3 minutes. The area on your body should be completely wet with CHG when you are done scrubbing. 2. Do not rinse. Discard the cloth and let the area air-dry. Do not put any substances on the area afterward, such as powder, lotion, or perfume. 3. Put on clean clothes or pajamas. 4. If it is the night before your surgery, sleep in clean sheets.  For general bathing Follow these steps when using CHG cloths for general bathing (unless your health care provider  gives you different instructions). 1. Use a separate CHG cloth for each area of your body. Make sure you wash between any folds of skin and between your fingers and toes. Wash your body in the following order, switching to a new cloth after each step: ? The front of your neck, shoulders, and chest. ? Both of your arms, under your arms, and your hands. ? Your stomach and groin area, avoiding the genitals. ? Your right leg and foot. ? Your left leg and foot. ? The back of your neck, your back, and your buttocks. 2. Do not rinse. Discard the cloth and let the area air-dry. Do not put any substances on your body afterward--such as powder, lotion, or perfume--unless you are told to do so by your health care provider. Only use lotions that are recommended by the manufacturer. 3. Put on clean clothes or pajamas. Contact a health care provider if:  Your skin gets irritated after scrubbing.  You have questions about using your solution or cloth. Get  help right away if:  Your eyes become very red or swollen.  Your eyes itch badly.  Your skin itches badly and is red or swollen.  Your hearing changes.  You have trouble seeing.  You have swelling or tingling in your mouth or throat.  You have trouble breathing.  You swallow any chlorhexidine. Summary  Chlorhexidine gluconate (CHG) is a germ-killing (antiseptic) solution that is used to clean the skin. Cleaning your skin with CHG may help to lower your risk for infection.  You may be given CHG to use for bathing. It may be in a bottle or in a prepackaged cloth to use on your skin. Carefully follow your health care provider's instructions and the instructions on the product label.  Do not use CHG if you have a chlorhexidine allergy.  Contact your health care provider if your skin gets irritated after scrubbing. This information is not intended to replace advice given to you by your health care provider. Make sure you discuss any questions  you have with your health care provider. Document Revised: 08/24/2018 Document Reviewed: 05/05/2017 Elsevier Patient Education  2020 [employer]

## 2020-04-04 ENCOUNTER — Other Ambulatory Visit (HOSPITAL_COMMUNITY)
Admission: RE | Admit: 2020-04-04 | Discharge: 2020-04-04 | Disposition: A | Discharge status: Home / Self Care | Payer: Self-pay | Source: Ambulatory Visit | Attending: Urology | Admitting: Urology

## 2020-04-04 ENCOUNTER — Other Ambulatory Visit: Payer: Self-pay

## 2020-04-04 ENCOUNTER — Encounter (HOSPITAL_COMMUNITY)
Admission: RE | Admit: 2020-04-04 | Discharge: 2020-04-04 | Disposition: A | Discharge status: Home / Self Care | Payer: Self-pay | Source: Ambulatory Visit | Attending: Urology | Admitting: Urology

## 2020-04-04 DIAGNOSIS — Z20822 Contact with and (suspected) exposure to covid-19: Secondary | ICD-10-CM | POA: Insufficient documentation

## 2020-04-04 DIAGNOSIS — Z01812 Encounter for preprocedural laboratory examination: Secondary | ICD-10-CM | POA: Insufficient documentation

## 2020-04-04 DIAGNOSIS — Z01810 Encounter for preprocedural cardiovascular examination: Secondary | ICD-10-CM | POA: Insufficient documentation

## 2020-04-04 LAB — BASIC METABOLIC PANEL
Anion gap: 6 (ref 5–15)
BUN: 21 mg/dL (ref 8–23)
CO2: 23 mmol/L (ref 22–32)
Calcium: 9.2 mg/dL (ref 8.9–10.3)
Chloride: 105 mmol/L (ref 98–111)
Creatinine, Ser: 1.23 mg/dL (ref 0.61–1.24)
GFR, Estimated: >60 mL/min (ref >60)
Glucose, Bld: 195 mg/dL — ABNORMAL HIGH (ref 70–99)
Potassium: 3.4 mmol/L — ABNORMAL LOW (ref 3.5–5.1)
Sodium: 134 mmol/L — ABNORMAL LOW (ref 135–145)

## 2020-04-04 LAB — SARS CORONAVIRUS 2 (TAT 6-24 HRS): SARS Coronavirus 2: NEGATIVE

## 2020-04-07 ENCOUNTER — Ambulatory Visit (HOSPITAL_COMMUNITY): Payer: Self-pay | Admitting: Anesthesiology

## 2020-04-07 ENCOUNTER — Encounter (HOSPITAL_COMMUNITY)
Admission: RE | Disposition: A | Discharge status: Home / Self Care | Payer: Self-pay | Source: Home / Self Care | Attending: Urology

## 2020-04-07 ENCOUNTER — Ambulatory Visit (HOSPITAL_COMMUNITY)
Admission: RE | Admit: 2020-04-07 | Discharge: 2020-04-07 | Disposition: A | Discharge status: Home / Self Care | Payer: Self-pay | Attending: Urology | Admitting: Urology

## 2020-04-07 ENCOUNTER — Encounter (HOSPITAL_COMMUNITY): Payer: Self-pay | Admitting: Urology

## 2020-04-07 DIAGNOSIS — N433 Hydrocele, unspecified: Secondary | ICD-10-CM

## 2020-04-07 DIAGNOSIS — N432 Other hydrocele: Secondary | ICD-10-CM

## 2020-04-07 DIAGNOSIS — F17210 Nicotine dependence, cigarettes, uncomplicated: Secondary | ICD-10-CM | POA: Insufficient documentation

## 2020-04-07 DIAGNOSIS — I10 Essential (primary) hypertension: Secondary | ICD-10-CM | POA: Insufficient documentation

## 2020-04-07 DIAGNOSIS — Z79899 Other long term (current) drug therapy: Secondary | ICD-10-CM | POA: Insufficient documentation

## 2020-04-07 HISTORY — PX: HYDROCELE EXCISION: SHX482

## 2020-04-07 SURGERY — HYDROCELECTOMY
Anesthesia: General | Site: Scrotum | Laterality: Right

## 2020-04-07 MED ORDER — CHLORHEXIDINE GLUCONATE 0.12 % MT SOLN
15.0000 mL | Freq: Once | OROMUCOSAL | Status: AC
  Administered 2020-04-07: 15 mL via OROMUCOSAL

## 2020-04-07 MED ORDER — CEFAZOLIN SODIUM-DEXTROSE 2-4 GM/100ML-% IV SOLN
2.0000 g | INTRAVENOUS | Status: AC
  Administered 2020-04-07: 2 g via INTRAVENOUS

## 2020-04-07 MED ORDER — CEFAZOLIN SODIUM-DEXTROSE 2-4 GM/100ML-% IV SOLN
INTRAVENOUS | Status: AC
  Filled 2020-04-07: qty 100

## 2020-04-07 MED ORDER — BUPIVACAINE HCL (PF) 0.25 % IJ SOLN
INTRAMUSCULAR | Status: AC
  Filled 2020-04-07: qty 30

## 2020-04-07 MED ORDER — SODIUM CHLORIDE 0.9 % IV SOLN
INTRAVENOUS | Status: DC | PRN
  Administered 2020-04-07: 500 mg via INTRAPLEURAL

## 2020-04-07 MED ORDER — ONDANSETRON HCL 4 MG/2ML IJ SOLN
INTRAMUSCULAR | Status: AC
  Filled 2020-04-07: qty 2

## 2020-04-07 MED ORDER — HYDROMORPHONE HCL 1 MG/ML IJ SOLN
INTRAMUSCULAR | Status: AC
  Filled 2020-04-07: qty 0.5

## 2020-04-07 MED ORDER — FENTANYL CITRATE (PF) 100 MCG/2ML IJ SOLN
INTRAMUSCULAR | Status: AC
  Filled 2020-04-07: qty 2

## 2020-04-07 MED ORDER — 0.9 % SODIUM CHLORIDE (POUR BTL) OPTIME
TOPICAL | Status: DC | PRN
  Administered 2020-04-07: 1000 mL

## 2020-04-07 MED ORDER — LACTATED RINGERS IV SOLN: INTRAVENOUS | Status: DC | PRN

## 2020-04-07 MED ORDER — CHLORHEXIDINE GLUCONATE 0.12 % MT SOLN
OROMUCOSAL | Status: AC
  Filled 2020-04-07: qty 15

## 2020-04-07 MED ORDER — DOXYCYCLINE HYCLATE 100 MG IV SOLR
100.0000 mg | Freq: Once | INTRAVENOUS | Status: DC
  Filled 2020-04-07: qty 100

## 2020-04-07 MED ORDER — PROPOFOL 10 MG/ML IV BOLUS
INTRAVENOUS | Status: AC
  Filled 2020-04-07: qty 20

## 2020-04-07 MED ORDER — MIDAZOLAM HCL 2 MG/2ML IJ SOLN
INTRAMUSCULAR | Status: AC
  Filled 2020-04-07: qty 2

## 2020-04-07 MED ORDER — OXYCODONE-ACETAMINOPHEN 5-325 MG PO TABS: 1.0000 | ORAL_TABLET | ORAL | 0 refills | Status: DC | PRN

## 2020-04-07 MED ORDER — BUPIVACAINE HCL (PF) 0.25 % IJ SOLN
INTRAMUSCULAR | Status: DC | PRN
  Administered 2020-04-07: 10 mL

## 2020-04-07 MED ORDER — ONDANSETRON HCL 4 MG/2ML IJ SOLN: 4.0000 mg | Freq: Once | INTRAMUSCULAR | Status: DC | PRN

## 2020-04-07 MED ORDER — MIDAZOLAM HCL 5 MG/5ML IJ SOLN
INTRAMUSCULAR | Status: DC | PRN
  Administered 2020-04-07: 2 mg via INTRAVENOUS

## 2020-04-07 MED ORDER — HYDROMORPHONE HCL 1 MG/ML IJ SOLN
0.2500 mg | INTRAMUSCULAR | Status: DC | PRN
  Administered 2020-04-07 (×3): 0.5 mg via INTRAVENOUS
  Filled 2020-04-07 (×2): qty 0.5

## 2020-04-07 MED ORDER — LACTATED RINGERS IV SOLN
INTRAVENOUS | Status: DC
  Administered 2020-04-07: 1000 mL via INTRAVENOUS

## 2020-04-07 MED ORDER — ORAL CARE MOUTH RINSE: 15.0000 mL | Freq: Once | OROMUCOSAL | Status: AC

## 2020-04-07 MED ORDER — ONDANSETRON HCL 4 MG/2ML IJ SOLN
INTRAMUSCULAR | Status: DC | PRN
  Administered 2020-04-07: 4 mg via INTRAVENOUS

## 2020-04-07 MED ORDER — PROPOFOL 10 MG/ML IV BOLUS
INTRAVENOUS | Status: DC | PRN
  Administered 2020-04-07: 200 mg via INTRAVENOUS

## 2020-04-07 MED ORDER — FENTANYL CITRATE (PF) 100 MCG/2ML IJ SOLN
INTRAMUSCULAR | Status: DC | PRN
Start: 2020-04-07 — End: 2020-04-07
  Administered 2020-04-07: 100 ug via INTRAVENOUS

## 2020-04-07 SURGICAL SUPPLY — 31 items
BLADE SURG 15 STRL LF DISP TIS (BLADE) ×1 IMPLANT
BLADE SURG 15 STRL SS (BLADE) ×1
COVER LIGHT HANDLE STERIS (MISCELLANEOUS) ×4 IMPLANT
COVER WAND RF STERILE (DRAPES) ×2 IMPLANT
DECANTER SPIKE VIAL GLASS SM (MISCELLANEOUS) ×2 IMPLANT
DERMABOND ADVANCED (GAUZE/BANDAGES/DRESSINGS) ×1
DERMABOND ADVANCED .7 DNX12 (GAUZE/BANDAGES/DRESSINGS) ×1 IMPLANT
ELECT REM PT RETURN 9FT ADLT (ELECTROSURGICAL) ×2
ELECTRODE REM PT RTRN 9FT ADLT (ELECTROSURGICAL) ×1 IMPLANT
GAUZE SPONGE 4X4 12PLY STRL (GAUZE/BANDAGES/DRESSINGS) ×4 IMPLANT
GLOVE BIO SURGEON STRL SZ8 (GLOVE) ×2 IMPLANT
GLOVE BIOGEL PI IND STRL 7.0 (GLOVE) ×2 IMPLANT
GLOVE BIOGEL PI IND STRL 8 (GLOVE) ×1 IMPLANT
GLOVE BIOGEL PI INDICATOR 7.0 (GLOVE) ×2
GLOVE BIOGEL PI INDICATOR 8 (GLOVE) ×1
GOWN STRL REUS W/TWL LRG LVL3 (GOWN DISPOSABLE) ×2 IMPLANT
GOWN STRL REUS W/TWL XL LVL3 (GOWN DISPOSABLE) ×2 IMPLANT
KIT TURNOVER KIT A (KITS) ×2 IMPLANT
MANIFOLD NEPTUNE II (INSTRUMENTS) ×2 IMPLANT
NEEDLE HYPO 25X1 1.5 SAFETY (NEEDLE) ×2 IMPLANT
NS IRRIG 1000ML POUR BTL (IV SOLUTION) ×2 IMPLANT
PACK MINOR (CUSTOM PROCEDURE TRAY) ×2 IMPLANT
PAD ARMBOARD 7.5X6 YLW CONV (MISCELLANEOUS) ×2 IMPLANT
PENCIL SMOKE EVACUATOR (MISCELLANEOUS) ×2 IMPLANT
SET BASIN LINEN APH (SET/KITS/TRAYS/PACK) ×2 IMPLANT
SUPPORT SCROTAL LG STRP (MISCELLANEOUS) ×2 IMPLANT
SUT MNCRL AB 4-0 PS2 18 (SUTURE) ×2 IMPLANT
SUT VIC AB 3-0 SH 27 (SUTURE) ×1
SUT VIC AB 3-0 SH 27X BRD (SUTURE) ×1 IMPLANT
SYR CONTROL 10ML LL (SYRINGE) ×2 IMPLANT
YANKAUER SUCT 12FT TUBE ARGYLE (SUCTIONS) ×2 IMPLANT

## 2020-04-07 NOTE — Anesthesia Procedure Notes (Signed)
Procedure Name: LMA Insertion Date/Time: 04/07/2020 12:13 PM Performed by: Shanon Payor, CRNA Pre-anesthesia Checklist: Patient identified, Emergency Drugs available, Suction available, Patient being monitored and Timeout performed Patient Re-evaluated:Patient Re-evaluated prior to induction Oxygen Delivery Method: Circle system utilized Preoxygenation: Pre-oxygenation with 100% oxygen Induction Type: IV induction LMA: LMA inserted LMA Size: 5.0 Number of attempts: 1 Placement Confirmation: positive ETCO2 and breath sounds checked- equal and bilateral Tube secured with: Tape Dental Injury: Teeth and Oropharynx as per pre-operative assessment

## 2020-04-07 NOTE — Interval H&P Note (Signed)
History and Physical Interval Note:  04/07/2020 11:54 AM  Logan Brewer  has presented today for surgery, with the diagnosis of recurrent right hydrocele.  The various methods of treatment have been discussed with the patient and family. After consideration of risks, benefits and other options for treatment, the patient has consented to  Procedure(s) with comments: HYDROCELECTOMY ADULT (Right) - new arrival time given to PAT - 11:15 as a surgical intervention.  The patient's history has been reviewed, patient examined, no change in status, stable for surgery.  I have reviewed the patient's chart and labs.  Questions were answered to the patient's satisfaction.     Krisalyn Yankowski   

## 2020-04-07 NOTE — Anesthesia Preprocedure Evaluation (Signed)
Anesthesia Evaluation  Patient identified by MRN, date of birth, ID band Patient awake    Reviewed: Allergy & Precautions, NPO status , Patient's Chart, lab work & pertinent test results  Airway Mallampati: II  TM Distance: >3 FB Neck ROM: Full    Dental  (+) Partial Upper, Missing, Dental Advisory Given   Pulmonary Current Smoker and Patient abstained from smoking.,    Pulmonary exam normal breath sounds clear to auscultation       Cardiovascular Exercise Tolerance: Good hypertension, Pt. on medications (-) anginaNormal cardiovascular exam Rhythm:Regular Rate:Normal  16-Nov-2019 15:39:48  [facility] ROUTINE RECORD Normal sinus rhythm Right atrial enlargement Left ventricular hypertrophy with repolarization abnormality ( Sokolow-Lyon , Romhilt-Estes ) Abnormal ECG No significant change since last tracing Confirmed by [place], Peter [phone]) on 11/16/2019 6:05:22 PM   Neuro/Psych Anxiety  Neuromuscular disease    GI/Hepatic negative GI ROS, (+)     substance abuse  marijuana use,   Endo/Other    Renal/GU      Musculoskeletal Back pain   Abdominal   Peds  Hematology   Anesthesia Other Findings   Reproductive/Obstetrics                             Anesthesia Physical  Anesthesia Plan  ASA: II  Anesthesia Plan: General   Post-op Pain Management:    Induction:   PONV Risk Score and Plan: 4 or greater and Ondansetron and Midazolam  Airway Management Planned:   Additional Equipment:   Intra-op Plan:   Post-operative Plan:   Informed Consent: I have reviewed the patients History and Physical, chart, labs and discussed the procedure including the risks, benefits and alternatives for the proposed anesthesia with the patient or authorized representative who has indicated his/her understanding and acceptance.     Dental advisory given  Plan Discussed with:  CRNA and Surgeon  Anesthesia Plan Comments:         Anesthesia Quick Evaluation

## 2020-04-07 NOTE — Anesthesia Postprocedure Evaluation (Signed)
Anesthesia Post Note  Patient: Logan Brewer  Procedure(s) Performed: RIGHT HYDROCELECTOMY (Right Scrotum)  Anesthesia Type: General Level of consciousness: awake, oriented and patient cooperative Pain management: satisfactory to patient Vital Signs Assessment: post-procedure vital signs reviewed and stable Respiratory status: spontaneous breathing, respiratory function stable and nonlabored ventilation Cardiovascular status: stable Postop Assessment: no apparent nausea or vomiting Anesthetic complications: no   No complications documented.   Last Vitals:  Vitals:   04/07/20 1125 04/07/20 1258  BP: (!) 157/81 (!) 175/74  Pulse: 68 (P) 78  Resp: 20 19  Temp: 36.9 C (P) 36.5 C  SpO2: 98%     Last Pain:  Vitals:   04/07/20 1125  TempSrc: Oral  PainSc: 2                  Thorne Wirz

## 2020-04-07 NOTE — Transfer of Care (Signed)
Immediate Anesthesia Transfer of Care Note  Patient: Logan Brewer  Procedure(s) Performed: RIGHT HYDROCELECTOMY (Right Scrotum)  Patient Location: PACU  Anesthesia Type:General  Level of Consciousness: awake, oriented, drowsy and patient cooperative  Airway & Oxygen Therapy: Patient Spontanous Breathing  Post-op Assessment: Report given to RN, Post -op Vital signs reviewed and stable and Patient moving all extremities X 4  Post vital signs: Reviewed and stable  Last Vitals:  Vitals Value Taken Time  BP 175/74 04/07/20 1258  Temp    Pulse    Resp 17 04/07/20 1300  SpO2    Vitals shown include unvalidated device data.  Last Pain:  Vitals:   04/07/20 1125  TempSrc: Oral  PainSc: 2       Patients Stated Pain Goal: 5 (04/07/20 1125)  Complications: No complications documented.

## 2020-04-07 NOTE — Op Note (Addendum)
Preoperative diagnosis: Recurrent Right Hydrocele  Postoperative diagnosis: Same  Procedure: 1. Right hydrocelectomy  Attending: Wilkie Aye, MD  Anesthesia: General  History of blood loss: Minimal  Antibiotics: ancef  Drains: none  Specimens: 1. Right hydrocele sac   Findings: 6cm hydrocele. Significant scarring of the right testis from prior hydrocelectomy.   Indications: Patient is a 64 year old male with a history of recurrent right hydrocele that was growing in size and causing him pain with walking.  We discussed the treatment options including observation versus excision after discussing treatment options he proceed with excision.   Procedure in detail: Prior to procedure consent was obtained.  Patient was brought to the operating room and a brief timeout was done to ensure correct patient, correct procedure, correct site.  General anesthesia was administered and patient was placed in supine position.  His genitalia was then prepped and draped in usual sterile fashion.  A 3 cm incision was made in the right hemiscrotum.  We dissected down to the tunica and then incised the tunica. A large hydrocele was encountered and was drained. We then excised the hydrocele sac and then over sewed the edge with 2-0 Vicryl in a running fashion. We then excised the right appendix testis. Hemostasis was then obtained with electrocautery. We then returned the testis to the right hemiscrotum and placed 100mg  doxycyline as a sclerosing agent. We then closed the overlying dartos with 3-0 vicryl in a running fashion. The skin was then closed with 4-0 monocryl in a running fashion. Dermabond was placed on the incision.  A dressing was then applied to the incision.  We then placed a scrotal fluff and this then concluded the procedure which was well tolerated by the patient.  Complications: None  Condition: Stable, extubated, transferred to PACU.  Plan: Patient is to be discharged home.  He is to  follow up in 2 weeks for wound check.

## 2020-04-07 NOTE — Interval H&P Note (Signed)
History and Physical Interval Note:  04/07/2020 11:54 AM  Logan Brewer  has presented today for surgery, with the diagnosis of recurrent right hydrocele.  The various methods of treatment have been discussed with the patient and family. After consideration of risks, benefits and other options for treatment, the patient has consented to  Procedure(s) with comments: HYDROCELECTOMY ADULT (Right) - new arrival time given to PAT - 11:15 as a surgical intervention.  The patient's history has been reviewed, patient examined, no change in status, stable for surgery.  I have reviewed the patient's chart and labs.  Questions were answered to the patient's satisfaction.     Wilkie Aye

## 2020-04-07 NOTE — Discharge Instructions (Signed)
Current Urology, 10(1), 1-14. https://[email or web address]">  Hydrocelectomy, Adult, Care After This sheet gives you information about how to care for yourself after your procedure. Your health care provider may also give you more specific instructions. If you have problems or questions, contact your health care provider. What can I expect after the procedure? After your procedure, it is common to have:  Mild discomfort and swelling in the pouch that holds your testicles (scrotum).  Bruising of the scrotum. Follow these instructions at home: Medicines  Take over-the-counter and prescription medicines only as told by your health care provider.  Ask your health care provider if the medicine prescribed to you: ? Requires you to avoid driving or using heavy machinery. ? Can cause constipation. You may need to take these actions to prevent or treat constipation:  Drink enough fluid to keep your urine pale yellow.  Take over-the-counter or prescription medicines.  Eat foods that are high in fiber, such as beans, whole grains, and fresh fruits and vegetables.  Limit foods that are high in fat and processed sugars, such as fried or sweet foods. Bathing  Do not take baths, swim, or use a hot tub until your health care provider approves. Ask your health care provider if you may take showers. You may only be allowed to take sponge baths.  If you were told to wear an athletic support strap (scrotal support), keep it dry. Take it off when you shower or bathe. Incision care   Follow instructions from your health care provider about how to take care of your incision. Make sure you: ? Wash your hands with soap and water before and after you change your bandage (dressing). If soap and water are not available, use hand sanitizer. ? Change your dressing as told by your health care provider. ? Leave stitches (sutures), skin glue, or adhesive strips in place. These skin closures may need to stay  in place for 2 weeks or longer. If adhesive strip edges start to loosen and curl up, you may trim the loose edges. Do not remove adhesive strips completely unless your health care provider tells you to do that.  Check your incision and scrotum every day for signs of infection. Check for: ? More redness, swelling, or pain. ? Fluid or blood. ? Warmth. ? Pus or a bad smell. Managing pain and swelling If directed, put ice on the affected area. To do this:  Put ice in a plastic bag.  Place a towel between your skin and the bag.  Leave the ice on for 20 minutes, 2-3 times per day.  Activity  Do not do any high-energy activities for as long as told by your health care provider.  Do not lift anything that is heavier than 10 lb (4.5 kg), or the limit that you are told, until your health care provider says that it is safe.  Return to your normal activities as told by your health care provider. Ask your health care provider what activities are safe for you.  Do not drive for 24 hours if you were given a sedative during your procedure.  Ask your health care provider when it is safe to drive. General instructions  Do not use any products that contain nicotine or tobacco, such as cigarettes, e-cigarettes, and chewing tobacco. These can delay incision healing after surgery. If you need help quitting, ask your health care provider.  If you were given a scrotal support, wear it as told by your health care provider.  If you   had a drain put in during the procedure, you will need to have it removed at a follow-up visit.  Keep all follow-up visits as told by your health care provider. This is important. Contact a health care provider if:  Your pain is not controlled with medicine.  You have more redness, swelling, or pain around your scrotum.  You have fluid or blood coming from your incision.  Your incision feels warm to the touch.  You have pus or a bad smell coming from your  scrotum.  You have a fever. Get help right away if:  You develop shaking, chills, and a fever that is higher than 101.8F (38.8C).  You have redness or swelling that starts at your scrotum and spreads outward to cover your whole groin.  You develop swelling of the legs or difficulty breathing. Summary  After a hydrocelectomy, it is common to have mild discomfort, swelling, and bruising.  Do not take baths, swim, or use a hot tub until your health care provider approves. Ask your health care provider if you may take showers.  If directed, put ice on the affected area to help with pain and swelling.  Do not do any high-energy activities or lift anything heavier than 10 lb (4.5 kg) for as long as told by your health care provider.  If you were given a scrotal support, keep it dry. Wear the scrotal support as told by your health care provider. This information is not intended to replace advice given to you by your health care provider. Make sure you discuss any questions you have with your health care provider. Document Revised: 10/31/2018 Document Reviewed: 10/31/2018 Elsevier Patient Education  2020 Elsevier Inc.   General Anesthesia, Adult, Care After This sheet gives you information about how to care for yourself after your procedure. Your health care provider may also give you more specific instructions. If you have problems or questions, contact your health care provider. What can I expect after the procedure? After the procedure, the following side effects are common:  Pain or discomfort at the IV site.  Nausea.  Vomiting.  Sore throat.  Trouble concentrating.  Feeling cold or chills.  Weak or tired.  Sleepiness and fatigue.  Soreness and body aches. These side effects can affect parts of the body that were not involved in surgery. Follow these instructions at home:  For at least 24 hours after the procedure:  Have a responsible adult stay with you. It is  important to have someone help care for you until you are awake and alert.  Rest as needed.  Do not: ? Participate in activities in which you could fall or become injured. ? Drive. ? Use heavy machinery. ? Drink alcohol. ? Take sleeping pills or medicines that cause drowsiness. ? Make important decisions or sign legal documents. ? Take care of children on your own. Eating and drinking  Follow any instructions from your health care provider about eating or drinking restrictions.  When you feel hungry, start by eating small amounts of foods that are soft and easy to digest (bland), such as toast. Gradually return to your regular diet.  Drink enough fluid to keep your urine pale yellow.  If you vomit, rehydrate by drinking water, juice, or clear broth. General instructions  If you have sleep apnea, surgery and certain medicines can increase your risk for breathing problems. Follow instructions from your health care provider about wearing your sleep device: ? Anytime you are sleeping, including during daytime naps. ?   While taking prescription pain medicines, sleeping medicines, or medicines that make you drowsy.  Return to your normal activities as told by your health care provider. Ask your health care provider what activities are safe for you.  Take over-the-counter and prescription medicines only as told by your health care provider.  If you smoke, do not smoke without supervision.  Keep all follow-up visits as told by your health care provider. This is important. Contact a health care provider if:  You have nausea or vomiting that does not get better with medicine.  You cannot eat or drink without vomiting.  You have pain that does not get better with medicine.  You are unable to pass urine.  You develop a skin rash.  You have a fever.  You have redness around your IV site that gets worse. Get help right away if:  You have difficulty breathing.  You have chest  pain.  You have blood in your urine or stool, or you vomit blood. Summary  After the procedure, it is common to have a sore throat or nausea. It is also common to feel tired.  Have a responsible adult stay with you for the first 24 hours after general anesthesia. It is important to have someone help care for you until you are awake and alert.  When you feel hungry, start by eating small amounts of foods that are soft and easy to digest (bland), such as toast. Gradually return to your regular diet.  Drink enough fluid to keep your urine pale yellow.  Return to your normal activities as told by your health care provider. Ask your health care provider what activities are safe for you. This information is not intended to replace advice given to you by your health care provider. Make sure you discuss any questions you have with your health care provider. Document Revised: 06/10/2017 Document Reviewed: 01/21/2017 Elsevier Patient Education  2020 Elsevier Inc.  

## 2020-04-08 ENCOUNTER — Encounter (HOSPITAL_COMMUNITY): Payer: Self-pay | Admitting: Urology

## 2020-04-24 ENCOUNTER — Ambulatory Visit: Payer: Self-pay | Admitting: Urology

## 2020-04-25 ENCOUNTER — Other Ambulatory Visit: Payer: Self-pay

## 2020-04-25 ENCOUNTER — Encounter: Payer: Self-pay | Admitting: Urology

## 2020-04-25 ENCOUNTER — Ambulatory Visit (INDEPENDENT_AMBULATORY_CARE_PROVIDER_SITE_OTHER): Payer: Self-pay | Admitting: Urology

## 2020-04-25 VITALS — BP 169/99 | HR 81 | Temp 98.9°F | Ht 67.0 in | Wt 174.0 lb

## 2020-04-25 DIAGNOSIS — A64 Unspecified sexually transmitted disease: Secondary | ICD-10-CM | POA: Insufficient documentation

## 2020-04-25 DIAGNOSIS — N432 Other hydrocele: Secondary | ICD-10-CM

## 2020-04-25 LAB — URINALYSIS, ROUTINE W REFLEX MICROSCOPIC
Bilirubin, UA: NEGATIVE
Glucose, UA: NEGATIVE
Ketones, UA: NEGATIVE
Leukocytes,UA: NEGATIVE
Nitrite, UA: NEGATIVE
Protein,UA: NEGATIVE
RBC, UA: NEGATIVE
Specific Gravity, UA: 1.02 (ref 1.005–1.030)
Urobilinogen, Ur: 0.2 mg/dL (ref 0.2–1.0)
pH, UA: 5.5 (ref 5.0–7.5)

## 2020-04-25 MED ORDER — DOXYCYCLINE HYCLATE 100 MG PO CAPS
100.0000 mg | ORAL_CAPSULE | Freq: Two times a day (BID) | ORAL | 0 refills | Status: DC
Start: 2020-04-25 — End: 2020-07-10

## 2020-04-25 MED ORDER — OXYCODONE-ACETAMINOPHEN 5-325 MG PO TABS: 1.0000 | ORAL_TABLET | ORAL | 0 refills | Status: DC | PRN

## 2020-04-25 NOTE — Progress Notes (Signed)
04/25/2020 2:56 PM   Logan Brewer 1956/05/18 [id number]  Referring provider: Grayce Sessions, NP [address] [place],  [place] [address]  followup after hydrocelectomy  HPI: [name] is a 64yo here for followup after hydrocelectomy. He was doing well until 1 week ago when the incision opened in 1 area and has some drainage. He has mild rigth testicular pain. He was told by his partner that she tested positive for chlamydia last weak. He has mild dysuria    PMH: Past Medical History:  Diagnosis Date  . Hydrocele in adult   . Hypertension   . Inguinal hernia     Surgical History: Past Surgical History:  Procedure Laterality Date  . APPENDECTOMY    . HYDROCELE EXCISION Bilateral 11/21/2019   Procedure: HYDROCELECTOMY ADULT;  Surgeon: Logan Gauze, MD;  Location: [facility];  Service: Urology;  Laterality: Bilateral;  [id number]  . HYDROCELE EXCISION Right 04/07/2020   Procedure: RIGHT HYDROCELECTOMY;  Surgeon: Logan Gauze, MD;  Location: [facility];  Service: Urology;  Laterality: Right;  . INGUINAL HERNIA REPAIR Left 11/21/2019   Procedure: HERNIA REPAIR INGUINAL ADULT;  Surgeon: Logan Macho, MD;  Location: [facility];  Service: General;  Laterality: Left;  [id number]    Home Medications:  Allergies as of 04/25/2020   No Known Allergies     Medication List       Accurate as of April 25, 2020  2:56 PM. If you have any questions, ask your nurse or doctor.        amLODipine 10 MG tablet Commonly known as: NORVASC Take 1 tablet (10 mg total) by mouth daily.   doxycycline 100 MG capsule Commonly known as: VIBRAMYCIN Take 1 capsule (100 mg total) by mouth every 12 (twelve) hours.   hydrochlorothiazide 25 MG tablet Commonly known as: HYDRODIURIL Take 1 tablet (25 mg total) by mouth daily.   oxyCODONE-acetaminophen 5-325 MG tablet Commonly known as: Percocet Take 1 tablet by mouth every 4 (four) hours as needed for moderate pain or severe pain.     sildenafil 100 MG tablet Commonly known as: VIAGRA Take 1 tablet (100 mg total) by mouth daily as needed for erectile dysfunction.       Allergies: No Known Allergies  Family History: No family history on file.  Social History:  reports that he has been smoking cigarettes. He has been smoking about 0.50 packs per day. He has never used smokeless tobacco. He reports current drug use. Drug: Marijuana. He reports that he does not drink alcohol.  ROS: All other review of systems were reviewed and are negative except what is noted above in HPI  Physical Exam: BP (!) 169/99   Pulse 81   Temp 98.9 F (37.2 C)   Ht 5\' 7"  (1.702 m)   Wt 174 lb (78.9 kg)   BMI 27.25 kg/m   Constitutional:  Alert and oriented, No acute distress. HEENT: [place] AT, moist mucus membranes.  Trachea midline, no masses. Cardiovascular: No clubbing, cyanosis, or edema. Respiratory: Normal respiratory effort, no increased work of breathing. GI: Abdomen is soft, nontender, nondistended, no abdominal masses GU: No CVA tenderness. Circumcised phallus. No masses/lesions on penis, testis, scrotum. 1cm open area on right scrotal incision. Mild erythema, clear drainage Lymph: No cervical or inguinal lymphadenopathy. Skin: No rashes, bruises or suspicious lesions. Neurologic: Grossly intact, no focal deficits, moving all 4 extremities. Psychiatric: Normal mood and affect.  Laboratory Data: Lab Results  Component Value Date   WBC 6.5 11/16/2019  HGB 15.9 11/16/2019   HCT 46.4 11/16/2019   MCV 96.5 11/16/2019   PLT 242 11/16/2019    Lab Results  Component Value Date   CREATININE 1.23 04/04/2020    No results found for: PSA  No results found for: TESTOSTERONE  No results found for: HGBA1C  Urinalysis    Component Value Date/Time   COLORURINE YELLOW 09/09/2019 1152   APPEARANCEUR Clear 02/22/2020 1420   LABSPEC 1.023 09/09/2019 1152   PHURINE 5.0 09/09/2019 1152   GLUCOSEU Trace (A) 02/22/2020 1420    HGBUR NEGATIVE 09/09/2019 1152   BILIRUBINUR Negative 02/22/2020 1420   KETONESUR NEGATIVE 09/09/2019 1152   PROTEINUR Trace (A) 02/22/2020 1420   PROTEINUR NEGATIVE 09/09/2019 1152   UROBILINOGEN 1.0 04/25/2013 1237   NITRITE Negative 02/22/2020 1420   NITRITE NEGATIVE 09/09/2019 1152   LEUKOCYTESUR Negative 02/22/2020 1420   LEUKOCYTESUR NEGATIVE 09/09/2019 1152    Lab Results  Component Value Date   LABMICR Comment 02/22/2020   BACTERIA NONE SEEN 07/26/2018    Pertinent Imaging:  No results found for this or any previous visit.  No results found for this or any previous visit.  No results found for this or any previous visit.  No results found for this or any previous visit.  No results found for this or any previous visit.  No results found for this or any previous visit.  No results found for this or any previous visit.  No results found for this or any previous visit.   Assessment & Plan:    1. Other hydrocele -doxycycline 100mg  BID for 14 days - Urinalysis, Routine w reflex microscopic   No follow-ups on file.  , MD  [facility] [place]

## 2020-04-25 NOTE — Progress Notes (Signed)

## 2020-04-25 NOTE — Patient Instructions (Signed)
Chlamydia, Male    Chlamydia is an STD (sexually transmitted disease). It is a bacterial infection that spreads through sexual contact (is contagious). Chlamydia can occur in different areas of the body, including the tube that moves urine from the bladder out of the body (urethra), the throat, or the rectum. This condition is not difficult to treat. However, if left untreated, chlamydia can lead to more serious health problems.  What are the causes?  Chlamydia is caused by the bacteria Chlamydia trachomatis. It is passed from an infected partner during sexual activity. Chlamydia can spread through contact with the genitals, mouth, or rectum.  What are the signs or symptoms?  In some cases, there may not be any symptoms for this condition (asymptomatic), especially early in the infection. If symptoms develop, they may include:  · Burning when urinating.  · Urinating frequently.  · Pain or swelling in the testicles.  · Watery, mucus-like discharge from the penis.  · Redness, soreness, and swelling (inflammation) of the rectum.  · Bleeding or discharge from the rectum.  · Abdominal pain.  · Itching, burning, or redness in the eyes, or discharge from the eyes.  How is this diagnosed?  This condition may be diagnosed based on:  · Urine tests.  · Swab tests. Depending on your symptoms, your health care provider may use a cotton swab to collect discharge from your urethra or rectum to test for the bacteria.  How is this treated?  This condition is treated with antibiotic medicines.  Follow these instructions at home:  Medicines  · Take over-the-counter and prescription medicines only as told by your health care provider.  · Take your antibiotic medicine as told by your health care provider. Do not stop taking the antibiotic even if you start to feel better.  Sexual activity  · Tell sexual partners about your infection. This includes any oral, anal, or vaginal sex partners you have had within 60 days of when your symptoms  started. Sexual partners should also be treated, even if they have no signs of the disease.  · Do not have sex until you and your sexual partners have completed treatment and your health care provider says it is okay. If your health care provider prescribed you a single dose treatment, wait 7 days after taking the treatment before having sex.  General instructions  · It is your responsibility to get your test results. Ask your health care provider, or the department performing the test, when your results will be ready.  · Get plenty of rest.  · Eat a healthy, well-balanced diet.  · Drink enough fluids to keep your urine clear or pale yellow.  · Keep all follow-up visits as told by your health care provider. This is important. You may need to be tested for infection again 3 months after treatment.  How is this prevented?  The only sure way to prevent chlamydia is to avoid sexual intercourse. However, you can lower your risk by:  · Using latex condoms correctly every time you have sexual intercourse.  · Not having multiple sexual partners.  · Asking if your sexual partner has been tested for STIs and had negative results.  Contact a health care provider if:  · You develop new symptoms or your symptoms do not get better after completing treatment.  · You have a fever or chills.  · You have pain during sexual intercourse.  · You develop new joint pain or swelling near your joints.  · You   Chlamydia is an STD (sexually transmitted disease). It is a bacterial infection that spreads (is contagious) through sexual contact.  This condition is not difficult to treat, however, if left untreated, it can lead to more serious health problems.  In some cases, there may not  be any symptoms for this condition (asymptomatic).  This condition is treated with antibiotic medicines.  Using latex condoms correctly every time you have sexual intercourse can help prevent chlamydia. This information is not intended to replace advice given to you by your health care provider. Make sure you discuss any questions you have with your health care provider. Document Revised: 06/22/2017 Document Reviewed: 05/24/2016 Elsevier Patient Education  2020 Elsevier Inc.  

## 2020-05-18 ENCOUNTER — Encounter (HOSPITAL_COMMUNITY): Payer: Self-pay

## 2020-05-18 ENCOUNTER — Other Ambulatory Visit: Payer: Self-pay

## 2020-05-18 ENCOUNTER — Ambulatory Visit (HOSPITAL_COMMUNITY)
Admission: EM | Admit: 2020-05-18 | Discharge: 2020-05-18 | Disposition: A | Discharge status: Home / Self Care | Payer: Self-pay | Attending: Family Medicine | Admitting: Family Medicine

## 2020-05-18 DIAGNOSIS — H1131 Conjunctival hemorrhage, right eye: Secondary | ICD-10-CM

## 2020-05-18 MED ORDER — FLUORESCEIN SODIUM 1 MG OP STRP
ORAL_STRIP | OPHTHALMIC | Status: AC
  Filled 2020-05-18: qty 1

## 2020-05-18 MED ORDER — TETRACAINE HCL 0.5 % OP SOLN
OPHTHALMIC | Status: AC
  Filled 2020-05-18: qty 4

## 2020-05-18 MED ORDER — EYE WASH OPHTH SOLN
OPHTHALMIC | Status: AC
  Filled 2020-05-18: qty 118

## 2020-05-18 NOTE — ED Notes (Signed)
Dr lauenstein at bedside 

## 2020-05-18 NOTE — ED Notes (Signed)
Chemical reported as : sure klean [phone] Dispensing optician)

## 2020-05-18 NOTE — ED Provider Notes (Signed)
[facility]    CSN: [id number] Arrival date & time: 05/18/20  1529      History   Chief Complaint Chief Complaint  Patient presents with  . Eye Problem    Chemical spill    HPI Logan Brewer is a 64 y.o. male.   This is the initial Logan Brewer urgent care visit for this 64 year old man from [place] who spilled concrete cleaner in his right eye 1 week ago.  He flushed it copiously and is really not had much discomfort since but it is stayed red and he wanted it checked.  He says his vision has not changed.     Past Medical History:  Diagnosis Date  . Hydrocele in adult   . Hypertension   . Inguinal hernia     Patient Active Problem List   Diagnosis Date Noted  . STD (male) 04/25/2020  . Erectile dysfunction due to arterial insufficiency 02/26/2020  . Other hydrocele 12/05/2019  . Left inguinal hernia   . HTN (hypertension) 04/26/2013  . Leukocytosis, unspecified 04/26/2013  . Radiculopathy, lumbosacral region 04/25/2013  . Back pain 04/25/2013  . Anxiety 04/25/2013    Past Surgical History:  Procedure Laterality Date  . APPENDECTOMY    . HYDROCELE EXCISION Bilateral 11/21/2019   Procedure: HYDROCELECTOMY ADULT;  Surgeon: Malen Gauze, MD;  Location: [facility];  Service: Urology;  Laterality: Bilateral;  [id number]  . HYDROCELE EXCISION Right 04/07/2020   Procedure: RIGHT HYDROCELECTOMY;  Surgeon: Malen Gauze, MD;  Location: [facility];  Service: Urology;  Laterality: Right;  . INGUINAL HERNIA REPAIR Left 11/21/2019   Procedure: HERNIA REPAIR INGUINAL ADULT;  Surgeon: [name], MD;  Location: [facility];  Service: General;  Laterality: Left;  [id number]       Home Medications    Prior to Admission medications   Medication Sig Start Date End Date Taking? Authorizing Provider  amLODipine (NORVASC) 10 MG tablet Take 1 tablet (10 mg total) by mouth daily. 11/08/19   Grayce Sessions, NP  doxycycline (VIBRAMYCIN) 100 MG capsule Take 1 capsule  (100 mg total) by mouth every 12 (twelve) hours. 04/25/20   McKenzie, Mardene Celeste, MD  hydrochlorothiazide (HYDRODIURIL) 25 MG tablet Take 1 tablet (25 mg total) by mouth daily. 11/08/19   Grayce Sessions, NP  oxyCODONE-acetaminophen (PERCOCET) 5-325 MG tablet Take 1 tablet by mouth every 4 (four) hours as needed for moderate pain or severe pain. 04/25/20   McKenzie, Mardene Celeste, MD  sildenafil (VIAGRA) 100 MG tablet Take 1 tablet (100 mg total) by mouth daily as needed for erectile dysfunction. 02/22/20   McKenzie, Mardene Celeste, MD    Family History History reviewed. No pertinent family history.  Social History Social History   Tobacco Use  . Smoking status: Current Every Day Smoker    Packs/day: 0.50    Types: Cigarettes  . Smokeless tobacco: Never Used  Vaping Use  . Vaping Use: Never used  Substance Use Topics  . Alcohol use: No  . Drug use: Yes    Types: Marijuana    Comment: occasionally      Allergies   Patient has no known allergies.   Review of Systems Review of Systems  Eyes: Positive for redness.     Physical Exam Triage Vital Signs ED Triage Vitals [05/18/20 1621]  Enc Vitals Group     BP (!) 160/84     Pulse Rate 80     Resp 18     Temp 98.6 F (  37 C)     Temp Source Temporal     SpO2 100 %     Weight      Height      Head Circumference      Peak Flow      Pain Score 0     Pain Loc      Pain Edu?      Excl. in GC?    No data found.  Updated Vital Signs BP (!) 160/84 (BP Location: Right Arm)   Pulse 80   Temp 98.6 F (37 C) (Temporal)   Resp 18   SpO2 100%   Visual Acuity  Physical Exam Vitals and nursing note reviewed.  Constitutional:      Appearance: Normal appearance. He is normal weight.  Eyes:     Comments: Patient has a small right medial subconjunctival hemorrhage.  Fluorescein stain is negative.  Funduscopic exam is negative  EOM and pupils are equal  Pulmonary:     Effort: Pulmonary effort is normal.  Musculoskeletal:         General: Normal range of motion.     Cervical back: Normal range of motion and neck supple.  Skin:    General: Skin is warm and dry.  Neurological:     General: No focal deficit present.     Mental Status: He is alert.  Psychiatric:        Mood and Affect: Mood normal.      UC Treatments / Results  Labs (all labs ordered are listed, but only abnormal results are displayed) Labs Reviewed - No data to display  EKG   Radiology No results found.  Procedures Procedures (including critical care time)  Medications Ordered in UC Medications - No data to display  Initial Impression / Assessment and Plan / UC Course  I have reviewed the triage vital signs and the nursing notes.  Pertinent labs & imaging results that were available during my care of the patient were reviewed by me and considered in my medical decision making (see chart for details).    Final Clinical Impressions(s) / UC Diagnoses   Final diagnoses:  Subconjunctival hemorrhage of right eye     Discharge Instructions     No further treatment is necessary.  The redness will take another week to 10 days to resolve.    ED Prescriptions    None     PDMP not reviewed this encounter.   [name], MD 05/18/20 [phone]

## 2020-05-18 NOTE — ED Triage Notes (Signed)
Pt present right eye irritation. Pt states a week ago some type of chemical spilled in his eye. Pt state that he flushed his eyes out but the eye has not gotten better. Pt states right eye is itching, red and watery.

## 2020-05-18 NOTE — ED Notes (Signed)
Logan Brewer aware of patient

## 2020-05-18 NOTE — Discharge Instructions (Addendum)
No further treatment is necessary.  The redness will take another week to 10 days to resolve.

## 2020-05-22 ENCOUNTER — Ambulatory Visit: Payer: Self-pay | Admitting: Urology

## 2020-05-22 DIAGNOSIS — N5201 Erectile dysfunction due to arterial insufficiency: Secondary | ICD-10-CM

## 2020-05-22 DIAGNOSIS — N432 Other hydrocele: Secondary | ICD-10-CM

## 2020-06-18 ENCOUNTER — Ambulatory Visit: Payer: Self-pay | Admitting: Urology

## 2020-07-03 ENCOUNTER — Other Ambulatory Visit: Payer: Self-pay

## 2020-07-03 ENCOUNTER — Ambulatory Visit (INDEPENDENT_AMBULATORY_CARE_PROVIDER_SITE_OTHER): Payer: Self-pay | Admitting: Urology

## 2020-07-03 ENCOUNTER — Encounter: Payer: Self-pay | Admitting: Urology

## 2020-07-03 VITALS — BP 154/85 | HR 83 | Temp 99.4°F

## 2020-07-03 DIAGNOSIS — N5201 Erectile dysfunction due to arterial insufficiency: Secondary | ICD-10-CM

## 2020-07-03 DIAGNOSIS — F524 Premature ejaculation: Secondary | ICD-10-CM

## 2020-07-03 DIAGNOSIS — G8929 Other chronic pain: Secondary | ICD-10-CM

## 2020-07-03 DIAGNOSIS — M25512 Pain in left shoulder: Secondary | ICD-10-CM

## 2020-07-03 LAB — URINALYSIS, ROUTINE W REFLEX MICROSCOPIC
Bilirubin, UA: NEGATIVE
Glucose, UA: NEGATIVE
Ketones, UA: NEGATIVE
Leukocytes,UA: NEGATIVE
Nitrite, UA: NEGATIVE
Protein,UA: NEGATIVE
Specific Gravity, UA: 1.015 (ref 1.005–1.030)
Urobilinogen, Ur: 0.2 mg/dL (ref 0.2–1.0)
pH, UA: 6.5 (ref 5.0–7.5)

## 2020-07-03 LAB — MICROSCOPIC EXAMINATION
Bacteria, UA: NONE SEEN
Epithelial Cells (non renal): NONE SEEN /hpf (ref 0–10)
Renal Epithel, UA: NONE SEEN /hpf
WBC, UA: NONE SEEN /hpf (ref 0–5)

## 2020-07-03 MED ORDER — SILDENAFIL CITRATE 100 MG PO TABS: 100.0000 mg | ORAL_TABLET | Freq: Every day | ORAL | 5 refills | Status: DC | PRN

## 2020-07-03 MED ORDER — PAROXETINE HCL 10 MG PO TABS: 10.0000 mg | ORAL_TABLET | Freq: Every day | ORAL | 5 refills | Status: DC | PRN

## 2020-07-03 NOTE — Progress Notes (Signed)
Urological Symptom Review  Patient is experiencing the following symptoms: Frequent urination Get up at night to urinate   Review of Systems  Gastrointestinal (upper)  : Indigestion/heartburn  Gastrointestinal (lower) : Negative for lower GI symptoms  Constitutional : Negative for symptoms  Skin: Negative for skin symptoms  Eyes: Negative for eye symptoms  Ear/Nose/Throat : Negative for Ear/Nose/Throat symptoms  Hematologic/Lymphatic: Negative for Hematologic/Lymphatic symptoms  Cardiovascular : Negative for cardiovascular symptoms  Respiratory : Negative for respiratory symptoms  Endocrine: Negative for endocrine symptoms  Musculoskeletal: Back pain Joint pain  Neurological: Headaches  Psychologic: Negative for psychiatric symptoms

## 2020-07-03 NOTE — Progress Notes (Signed)
07/03/2020 11:48 AM   Logan Brewer 1956/03/06 [id number]  Referring provider: Grayce Sessions, NP [address] [place],  [place] [address]  Erectile dysfunction   HPI: Logan Brewer is a 64yo here for followup. He is doing well after hydrocelectomy. His complaint today is maintaining his erection and premature ejaculation. He ejaculated within 60 seconds of penetration on average. He is able to get a firm erection but he cannot maintain the erection. He has tried vaigra in the past which improves his erection but does no improve his premature ejaculation.    PMH: Past Medical History:  Diagnosis Date  . Hydrocele in adult   . Hypertension   . Inguinal hernia     Surgical History: Past Surgical History:  Procedure Laterality Date  . APPENDECTOMY    . HYDROCELE EXCISION Bilateral 11/21/2019   Procedure: HYDROCELECTOMY ADULT;  Surgeon: Malen Gauze, MD;  Location: [facility];  Service: Urology;  Laterality: Bilateral;  [id number]  . HYDROCELE EXCISION Right 04/07/2020   Procedure: RIGHT HYDROCELECTOMY;  Surgeon: Malen Gauze, MD;  Location: [facility];  Service: Urology;  Laterality: Right;  . INGUINAL HERNIA REPAIR Left 11/21/2019   Procedure: HERNIA REPAIR INGUINAL ADULT;  Surgeon: [name], MD;  Location: [facility];  Service: General;  Laterality: Left;  [id number]    Home Medications:  Allergies as of 07/03/2020   No Known Allergies     Medication List       Accurate as of July 03, 2020 11:48 AM. If you have any questions, ask your nurse or doctor.        amLODipine 10 MG tablet Commonly known as: NORVASC Take 1 tablet (10 mg total) by mouth daily.   doxycycline 100 MG capsule Commonly known as: VIBRAMYCIN Take 1 capsule (100 mg total) by mouth every 12 (twelve) hours.   hydrochlorothiazide 25 MG tablet Commonly known as: HYDRODIURIL Take 1 tablet (25 mg total) by mouth daily.   oxyCODONE-acetaminophen 5-325 MG tablet Commonly known as:  Percocet Take 1 tablet by mouth every 4 (four) hours as needed for moderate pain or severe pain.   sildenafil 100 MG tablet Commonly known as: VIAGRA Take 1 tablet (100 mg total) by mouth daily as needed for erectile dysfunction.       Allergies: No Known Allergies  Family History: No family history on file.  Social History:  reports that he has been smoking cigarettes. He has been smoking about 0.50 packs per day. He has never used smokeless tobacco. He reports current drug use. Drug: Marijuana. He reports that he does not drink alcohol.  ROS: All other review of systems were reviewed and are negative except what is noted above in HPI  Physical Exam: BP (!) 154/85   Pulse 83   Temp 99.4 F (37.4 C)   Constitutional:  Alert and oriented, No acute distress. HEENT: [place] AT, moist mucus membranes.  Trachea midline, no masses. Cardiovascular: No clubbing, cyanosis, or edema. Respiratory: Normal respiratory effort, no increased work of breathing. GI: Abdomen is soft, nontender, nondistended, no abdominal masses GU: No CVA tenderness. Circumcised phallus. No masses/lesions on penis, testis, scrotum. Prostate 40g smooth no nodules no induration.  Lymph: No cervical or inguinal lymphadenopathy. Skin: No rashes, bruises or suspicious lesions. Neurologic: Grossly intact, no focal deficits, moving all 4 extremities. Psychiatric: Normal mood and affect.  Laboratory Data: Lab Results  Component Value Date   WBC 6.5 11/16/2019   HGB 15.9 11/16/2019   HCT 46.4 11/16/2019  MCV 96.5 11/16/2019   PLT 242 11/16/2019    Lab Results  Component Value Date   CREATININE 1.23 04/04/2020    No results found for: PSA  No results found for: TESTOSTERONE  No results found for: HGBA1C  Urinalysis    Component Value Date/Time   COLORURINE YELLOW 09/09/2019 1152   APPEARANCEUR Clear 04/25/2020 1441   LABSPEC 1.023 09/09/2019 1152   PHURINE 5.0 09/09/2019 1152   GLUCOSEU Negative  04/25/2020 1441   HGBUR NEGATIVE 09/09/2019 1152   BILIRUBINUR Negative 04/25/2020 1441   KETONESUR NEGATIVE 09/09/2019 1152   PROTEINUR Negative 04/25/2020 1441   PROTEINUR NEGATIVE 09/09/2019 1152   UROBILINOGEN 1.0 04/25/2013 1237   NITRITE Negative 04/25/2020 1441   NITRITE NEGATIVE 09/09/2019 1152   LEUKOCYTESUR Negative 04/25/2020 1441   LEUKOCYTESUR NEGATIVE 09/09/2019 1152    Lab Results  Component Value Date   LABMICR Comment 04/25/2020   BACTERIA NONE SEEN 07/26/2018    Pertinent Imaging:  No results found for this or any previous visit.  No results found for this or any previous visit.  No results found for this or any previous visit.  No results found for this or any previous visit.  No results found for this or any previous visit.  No results found for this or any previous visit.  No results found for this or any previous visit.  No results found for this or any previous visit.   Assessment & Plan:    1. Erectile dysfunction due to arterial insufficiency -Continue sildenafil prn - Urinalysis, Routine w reflex microscopic  2. Premature ejaculation -We we trial paxil 10mg  prn -RTC 6 months   No follow-ups on file.  , MD  [facility] [place]

## 2020-07-03 NOTE — Patient Instructions (Signed)
Erectile Dysfunction Erectile dysfunction (ED) is the inability to get or keep an erection in order to have sexual intercourse. ED is considered a symptom of an underlying disorder and not considered a disease. Erectile dysfunction may include:  Inability to get an erection.  Lack of enough hardness of the erection to allow penetration.  Loss of the erection before sex is finished. What are the causes? This condition may be caused by:  Certain medicines, such as: ? Pain relievers. ? Antihistamines. ? Antidepressants. ? Blood pressure medicines. ? Water pills (diuretics). ? Ulcer medicines. ? Muscle relaxants. ? Drugs.  Excessive drinking.  Psychological causes, such as: ? Anxiety. ? Depression. ? Sadness. ? Exhaustion. ? Performance fear. ? Stress.  Physical causes, such as: ? Artery problems. This may include diabetes, smoking, liver disease, or atherosclerosis. ? High blood pressure. ? Hormonal problems, such as low testosterone. ? Obesity. ? Nerve problems. This may include back or pelvic injuries, diabetes mellitus, multiple sclerosis, or Parkinson's disease. What are the signs or symptoms? Symptoms of this condition include:  Inability to get an erection.  Lack of enough hardness of the erection to allow penetration.  Loss of the erection before sex is finished.  Normal erections at some times, but with frequent unsatisfactory episodes.  Low sexual satisfaction in either partner due to erection problems.  A curved penis occurring with erection. The curve may cause pain or the penis may be too curved to allow for intercourse.  Never having nighttime erections. How is this diagnosed? This condition is often diagnosed by:  Performing a physical exam to find other diseases or specific problems with the penis.  Asking you detailed questions about the problem.  Performing blood tests to check for diabetes mellitus or to measure hormone levels.  Performing  other tests to check for underlying health conditions.  Performing an ultrasound exam to check for scarring.  Performing a test to check blood flow to the penis.  Doing a sleep study at home to measure nighttime erections. How is this treated? This condition may be treated by:  Medicine taken by mouth to help you achieve an erection (oral medicine).  Hormone replacement therapy to replace low testosterone levels.  Medicine that is injected into the penis. Your health care provider may instruct you how to give yourself these injections at home.  Vacuum pump. This is a pump with a ring on it. The pump and ring are placed on the penis and used to create pressure that helps the penis become erect.  Penile implant surgery. In this procedure, you may receive: ? An inflatable implant. This consists of cylinders, a pump, and a reservoir. The cylinders can be inflated with a fluid that helps to create an erection, and they can be deflated after intercourse. ? A semi-rigid implant. This consists of two silicone rubber rods. The rods provide some rigidity. They are also flexible, so the penis can both curve downward in its normal position and become straight for sexual intercourse.  Blood vessel surgery, to improve blood flow to the penis. During this procedure, a blood vessel from a different part of the body is placed into the penis to allow blood to flow around (bypass) damaged or blocked blood vessels.  Lifestyle changes, such as exercising more, losing weight, and quitting smoking. Follow these instructions at home: Medicines  Take over-the-counter and prescription medicines only as told by your health care provider. Do not increase the dosage without first discussing it with your health care   provider.  If you are using self-injections, perform injections as directed by your health care provider. Make sure to avoid any veins that are on the surface of the penis. After giving an injection,  apply pressure to the injection site for 5 minutes.   General instructions  Exercise regularly, as directed by your health care provider. Work with your health care provider to lose weight, if needed.  Do not use any products that contain nicotine or tobacco, such as cigarettes and e-cigarettes. If you need help quitting, ask your health care provider.  Before using a vacuum pump, read the instructions that come with the pump and discuss any questions with your health care provider.  Keep all follow-up visits as told by your health care provider. This is important. Contact a health care provider if:  You feel nauseous.  You vomit. Get help right away if:  You are taking oral or injectable medicines and you have an erection that lasts longer than 4 hours. If your health care provider is unavailable, go to the nearest emergency room for evaluation. An erection that lasts much longer than 4 hours can result in permanent damage to your penis.  You have severe pain in your groin or abdomen.  You develop redness or severe swelling of your penis.  You have redness spreading up into your groin or lower abdomen.  You are unable to urinate.  You experience chest pain or a rapid heart beat (palpitations) after taking oral medicines. Summary  Erectile dysfunction (ED) is the inability to get or keep an erection during sexual intercourse. This problem can usually be treated successfully.  This condition is diagnosed based on a physical exam, your symptoms, and tests to determine the cause. Treatment varies depending on the cause and may include medicines, hormone therapy, surgery, or a vacuum pump.  You may need follow-up visits to make sure that you are using your medicines or devices correctly.  Get help right away if you are taking or injecting medicines and you have an erection that lasts longer than 4 hours. This information is not intended to replace advice given to you by your health  care provider. Make sure you discuss any questions you have with your health care provider. Document Revised: 02/22/2020 Document Reviewed: 02/22/2020 Elsevier Patient Education  2021 Elsevier Inc.  

## 2020-07-04 ENCOUNTER — Telehealth: Payer: Self-pay

## 2020-07-04 ENCOUNTER — Other Ambulatory Visit: Payer: Self-pay

## 2020-07-04 NOTE — Telephone Encounter (Signed)
Pt called saying he saw Dr. Ronne Binning yesterday and he was supposed to have refilled his oxycodone. Spoke with Dr. Ronne Binning and he said he was not refilling it. Pt. Notified.

## 2020-07-10 ENCOUNTER — Encounter: Payer: Self-pay | Admitting: Orthopaedic Surgery

## 2020-07-10 ENCOUNTER — Ambulatory Visit: Payer: Self-pay

## 2020-07-10 ENCOUNTER — Ambulatory Visit: Payer: Self-pay | Admitting: Orthopaedic Surgery

## 2020-07-10 ENCOUNTER — Other Ambulatory Visit: Payer: Self-pay

## 2020-07-10 VITALS — BP 163/89 | Ht 67.0 in | Wt 174.0 lb

## 2020-07-10 DIAGNOSIS — M25512 Pain in left shoulder: Secondary | ICD-10-CM

## 2020-07-10 DIAGNOSIS — G8929 Other chronic pain: Secondary | ICD-10-CM

## 2020-07-10 MED ORDER — HYDROCODONE-ACETAMINOPHEN 5-325 MG PO TABS
ORAL_TABLET | ORAL | 0 refills | Status: DC
Start: 2020-07-10 — End: 2020-07-24

## 2020-07-10 MED ORDER — NAPROXEN 500 MG PO TABS: 500.0000 mg | ORAL_TABLET | Freq: Two times a day (BID) | ORAL | 5 refills | Status: DC

## 2020-07-10 NOTE — Progress Notes (Signed)
Subjective:    Patient ID: Logan Brewer, male    DOB: [birth year], 65 y.o.   MRN: [id number]  HPI He has had pain in the left shoulder for almost three months.  He has no trauma.  He has had increasing pain and increasing loss of motion over this time.  He has no numbness, no swelling, no redness.  He has no other joint pains.  He has tried Tylenol and heat and ice with little help.  He has pain rolling over on it at night and is not sleeping well at all.  He is tired of hurting.  The cold weather and snow has made his pain worse he thinks.   Review of Systems  Constitutional: Positive for activity change.  Musculoskeletal: Positive for arthralgias.  All other systems reviewed and are negative.  For Review of Systems, all other systems reviewed and are negative.  The following is a summary of the past history medically, past history surgically, known current medicines, social history and family history.  This information is gathered electronically by the computer from prior information and documentation.  I review this each visit and have found including this information at this point in the chart is beneficial and informative.   Past Medical History:  Diagnosis Date  . Hydrocele in adult   . Hypertension   . Inguinal hernia     Past Surgical History:  Procedure Laterality Date  . APPENDECTOMY    . HYDROCELE EXCISION Bilateral 11/21/2019   Procedure: HYDROCELECTOMY ADULT;  Surgeon: Malen Gauze, MD;  Location: [facility];  Service: Urology;  Laterality: Bilateral;  [id number]  . HYDROCELE EXCISION Right 04/07/2020   Procedure: RIGHT HYDROCELECTOMY;  Surgeon: Malen Gauze, MD;  Location: [facility];  Service: Urology;  Laterality: Right;  . INGUINAL HERNIA REPAIR Left 11/21/2019   Procedure: HERNIA REPAIR INGUINAL ADULT;  Surgeon: [name], MD;  Location: [facility];  Service: General;  Laterality: Left;  [id number]    Current Outpatient Medications on File Prior to Visit   Medication Sig Dispense Refill  . amLODipine (NORVASC) 10 MG tablet Take 1 tablet (10 mg total) by mouth daily. 90 tablet 3  . hydrochlorothiazide (HYDRODIURIL) 25 MG tablet Take 1 tablet (25 mg total) by mouth daily. 90 tablet 3  . oxyCODONE-acetaminophen (PERCOCET) 5-325 MG tablet Take 1 tablet by mouth every 4 (four) hours as needed for moderate pain or severe pain. 30 tablet 0  . PARoxetine (PAXIL) 10 MG tablet Take 1 tablet (10 mg total) by mouth daily as needed. 30 tablet 5  . sildenafil (VIAGRA) 100 MG tablet Take 1 tablet (100 mg total) by mouth daily as needed for erectile dysfunction. 10 tablet 5   No current facility-administered medications on file prior to visit.    Social History   Socioeconomic History  . Marital status: Single    Spouse name: Not on file  . Number of children: Not on file  . Years of education: Not on file  . Highest education level: Not on file  Occupational History  . Not on file  Tobacco Use  . Smoking status: Current Every Day Smoker    Packs/day: 0.50    Types: Cigarettes  . Smokeless tobacco: Never Used  Vaping Use  . Vaping Use: Never used  Substance and Sexual Activity  . Alcohol use: No  . Drug use: Yes    Types: Marijuana    Comment: occasionally   . Sexual activity: Not on file  Other  Topics Concern  . Not on file  Social History Narrative  . Not on file   Social Determinants of Health   Financial Resource Strain: Not on file  Food Insecurity: Not on file  Transportation Needs: Not on file  Physical Activity: Not on file  Stress: Not on file  Social Connections: Not on file  Intimate Partner Violence: Not on file    No family history on file.  BP (!) 163/89   Ht 5\' 7"  (1.702 m)   Wt 174 lb (78.9 kg)   BMI 27.25 kg/m   Body mass index is 27.25 kg/m.      Objective:   Physical Exam Vitals and nursing note reviewed. Exam conducted with a chaperone present.  Constitutional:      Appearance: He is  well-developed and well-nourished.  HENT:     Head: Normocephalic and atraumatic.  Eyes:     Extraocular Movements: EOM normal.     Conjunctiva/sclera: Conjunctivae normal.     Pupils: Pupils are equal, round, and reactive to light.  Cardiovascular:     Rate and Rhythm: Normal rate and regular rhythm.     Pulses: Intact distal pulses.  Pulmonary:     Effort: Pulmonary effort is normal.  Abdominal:     Palpations: Abdomen is soft.  Musculoskeletal:       Arms:     Cervical back: Normal range of motion and neck supple.  Skin:    General: Skin is warm and dry.  Neurological:     Mental Status: He is alert and oriented to person, place, and time.     Cranial Nerves: No cranial nerve deficit.     Motor: No abnormal muscle tone.     Coordination: Coordination normal.     Deep Tendon Reflexes: Reflexes are normal and symmetric. Reflexes normal.  Psychiatric:        Mood and Affect: Mood and affect normal.        Behavior: Behavior normal.        Thought Content: Thought content normal.        Judgment: Judgment normal.    X-rays were done of the left shoulder, reported separately.       Assessment & Plan:   Encounter Diagnosis  Name Primary?  . Chronic left shoulder pain Yes   PROCEDURE NOTE:  The patient request injection, verbal consent was obtained.  The left shoulder was prepped appropriately after time out was performed.   Sterile technique was observed and injection of 1 cc of Depo-Medrol 40 mg with several cc's of plain xylocaine. Anesthesia was provided by ethyl chloride and a 20-gauge needle was used to inject the shoulder area. A posterior approach was used.  The injection was tolerated well.  A band aid dressing was applied.  The patient was advised to apply ice later today and tomorrow to the injection sight as needed.  I will begin Naprosyn and pain medicine.  I have reviewed the Controlled Substance Reporting System web site prior to  prescribing narcotic medicine for this patient.   Return in two weeks.  Consider MRI if not improved.  Electronically Signed [place], MD 1/20/20229:25 AM

## 2020-07-24 ENCOUNTER — Ambulatory Visit: Payer: Self-pay | Admitting: Orthopedic Surgery

## 2020-07-24 ENCOUNTER — Telehealth (INDEPENDENT_AMBULATORY_CARE_PROVIDER_SITE_OTHER): Payer: Self-pay | Admitting: Primary Care

## 2020-07-24 ENCOUNTER — Ambulatory Visit (INDEPENDENT_AMBULATORY_CARE_PROVIDER_SITE_OTHER): Payer: Self-pay | Admitting: Orthopaedic Surgery

## 2020-07-24 ENCOUNTER — Other Ambulatory Visit (INDEPENDENT_AMBULATORY_CARE_PROVIDER_SITE_OTHER): Payer: Self-pay | Admitting: Primary Care

## 2020-07-24 ENCOUNTER — Other Ambulatory Visit: Payer: Self-pay

## 2020-07-24 ENCOUNTER — Encounter: Payer: Self-pay | Admitting: Orthopaedic Surgery

## 2020-07-24 VITALS — BP 171/86 | HR 69 | Ht 67.0 in | Wt 178.0 lb

## 2020-07-24 DIAGNOSIS — G8929 Other chronic pain: Secondary | ICD-10-CM

## 2020-07-24 DIAGNOSIS — M25512 Pain in left shoulder: Secondary | ICD-10-CM

## 2020-07-24 MED ORDER — HYDROCODONE-ACETAMINOPHEN 5-325 MG PO TABS
ORAL_TABLET | ORAL | 0 refills | Status: DC
Start: 2020-07-24 — End: 2020-07-24

## 2020-07-24 MED ORDER — HYDROCODONE-ACETAMINOPHEN 5-325 MG PO TABS: ORAL_TABLET | ORAL | 0 refills | Status: DC

## 2020-07-24 NOTE — Progress Notes (Signed)
I called [facility] [address], advised not to fill Rx, Rx sent to [facility] for patient.

## 2020-07-24 NOTE — Progress Notes (Signed)
Patient Logan Brewer, male DOB:05/04/1956, 65 y.o. CBJ:[id number]  Chief Complaint  Patient presents with  . Shoulder Pain    Lt shoulder    HPI  Logan Brewer is a 65 y.o. male who has increasing pain of the left shoulder that is not getting any better.  He had only slight help from the injection last time.  He has no new trauma, no numbness.  I will get MRI of the shoulder on the left.   Body mass index is 27.88 kg/m.  ROS  Review of Systems  Constitutional: Positive for activity change.  Musculoskeletal: Positive for arthralgias.  All other systems reviewed and are negative.   All other systems reviewed and are negative.  The following is a summary of the past history medically, past history surgically, known current medicines, social history and family history.  This information is gathered electronically by the computer from prior information and documentation.  I review this each visit and have found including this information at this point in the chart is beneficial and informative.    Past Medical History:  Diagnosis Date  . Hydrocele in adult   . Hypertension   . Inguinal hernia     Past Surgical History:  Procedure Laterality Date  . APPENDECTOMY    . HYDROCELE EXCISION Bilateral 11/21/2019   Procedure: HYDROCELECTOMY ADULT;  Surgeon: Malen Gauze, MD;  Location: [facility];  Service: Urology;  Laterality: Bilateral;  [id number]  . HYDROCELE EXCISION Right 04/07/2020   Procedure: RIGHT HYDROCELECTOMY;  Surgeon: Malen Gauze, MD;  Location: [facility];  Service: Urology;  Laterality: Right;  . INGUINAL HERNIA REPAIR Left 11/21/2019   Procedure: HERNIA REPAIR INGUINAL ADULT;  Surgeon: [name], MD;  Location: [facility];  Service: General;  Laterality: Left;  [id number]    No family history on file.  Social History Social History   Tobacco Use  . Smoking status: Current Every Day Smoker    Packs/day: 0.50    Types: Cigarettes  . Smokeless tobacco:  Never Used  Vaping Use  . Vaping Use: Never used  Substance Use Topics  . Alcohol use: No  . Drug use: Yes    Types: Marijuana    Comment: occasionally     No Known Allergies  Current Outpatient Medications  Medication Sig Dispense Refill  . amLODipine (NORVASC) 10 MG tablet Take 1 tablet (10 mg total) by mouth daily. 90 tablet 3  . hydrochlorothiazide (HYDRODIURIL) 25 MG tablet Take 1 tablet (25 mg total) by mouth daily. 90 tablet 3  . HYDROcodone-acetaminophen (NORCO/VICODIN) 5-325 MG tablet One tablet every four hours for pain. 30 tablet 0  . naproxen (NAPROSYN) 500 MG tablet Take 1 tablet (500 mg total) by mouth 2 (two) times daily with a meal. 60 tablet 5  . PARoxetine (PAXIL) 10 MG tablet Take 1 tablet (10 mg total) by mouth daily as needed. 30 tablet 5  . sildenafil (VIAGRA) 100 MG tablet Take 1 tablet (100 mg total) by mouth daily as needed for erectile dysfunction. 10 tablet 5   No current facility-administered medications for this visit.     Physical Exam  Blood pressure (!) 171/86, pulse 69, height 5\' 7"  (1.702 m), weight 178 lb (80.7 kg).  Constitutional: overall normal hygiene, normal nutrition, well developed, normal grooming, normal body habitus. Assistive device:none  Musculoskeletal: gait and station Limp none, muscle tone and strength are normal, no tremors or atrophy is present.  .  Neurological: coordination overall normal.  Deep tendon reflex/nerve  stretch intact.  Sensation normal.  Cranial nerves II-XII intact.   Skin:   Normal overall no scars, lesions, ulcers or rashes. No psoriasis.  Psychiatric: Alert and oriented x 3.  Recent memory intact, remote memory unclear.  Normal mood and affect. Well groomed.  Good eye contact.  Cardiovascular: overall no swelling, no varicosities, no edema bilaterally, normal temperatures of the legs and arms, no clubbing, cyanosis and good capillary refill.  Lymphatic: palpation is normal.  Left shoulder motion is  painful. Forward 110, abduction 90 with pain, internal 25, external 25, extension 10, adduction nearly full.  All other systems reviewed and are negative   The patient has been educated about the nature of the problem(s) and counseled on treatment options.  The patient appeared to understand what I have discussed and is in agreement with it.  Encounter Diagnosis  Name Primary?  . Chronic left shoulder pain Yes    PLAN Call if any problems.  Precautions discussed.  Continue current medications.   Return to clinic 2 weeks   Get MRI of the left shoulder  I have reviewed the [place] Controlled Substance Reporting System web site prior to prescribing narcotic medicine for this patient.   Electronically Signed Darreld Mclean, MD 2/3/202210:49 AM

## 2020-07-24 NOTE — Telephone Encounter (Signed)
Patient called to ask why his BP medication request was denied.  He stated he is out of medication and needs it asap.  Please advise and call patient to discuss at [phone]

## 2020-07-26 ENCOUNTER — Other Ambulatory Visit: Payer: Self-pay | Admitting: Urology

## 2020-07-26 DIAGNOSIS — F524 Premature ejaculation: Secondary | ICD-10-CM

## 2020-07-28 ENCOUNTER — Other Ambulatory Visit (INDEPENDENT_AMBULATORY_CARE_PROVIDER_SITE_OTHER): Payer: Self-pay | Admitting: Primary Care

## 2020-07-28 DIAGNOSIS — I10 Essential (primary) hypertension: Secondary | ICD-10-CM

## 2020-07-28 MED ORDER — HYDROCHLOROTHIAZIDE 25 MG PO TABS: 25.0000 mg | ORAL_TABLET | Freq: Every day | ORAL | 0 refills | Status: DC

## 2020-07-28 MED ORDER — AMLODIPINE BESYLATE 10 MG PO TABS: 10.0000 mg | ORAL_TABLET | Freq: Every day | ORAL | Status: DC

## 2020-07-30 ENCOUNTER — Encounter: Payer: Self-pay | Admitting: Radiology

## 2020-08-14 ENCOUNTER — Ambulatory Visit: Payer: Self-pay | Admitting: Orthopaedic Surgery

## 2020-08-19 ENCOUNTER — Ambulatory Visit (INDEPENDENT_AMBULATORY_CARE_PROVIDER_SITE_OTHER): Payer: Self-pay | Admitting: Primary Care

## 2020-11-04 ENCOUNTER — Encounter (INDEPENDENT_AMBULATORY_CARE_PROVIDER_SITE_OTHER): Payer: Self-pay | Admitting: Primary Care

## 2020-11-04 ENCOUNTER — Other Ambulatory Visit (INDEPENDENT_AMBULATORY_CARE_PROVIDER_SITE_OTHER): Payer: Self-pay | Admitting: Primary Care

## 2020-11-04 ENCOUNTER — Ambulatory Visit (INDEPENDENT_AMBULATORY_CARE_PROVIDER_SITE_OTHER): Payer: Self-pay | Admitting: Primary Care

## 2020-11-04 ENCOUNTER — Other Ambulatory Visit: Payer: Self-pay

## 2020-11-04 VITALS — BP 172/70 | HR 72 | Temp 97.7°F | Ht 67.0 in | Wt 187.6 lb

## 2020-11-04 DIAGNOSIS — I10 Essential (primary) hypertension: Secondary | ICD-10-CM

## 2020-11-04 DIAGNOSIS — M545 Low back pain, unspecified: Secondary | ICD-10-CM

## 2020-11-04 DIAGNOSIS — E876 Hypokalemia: Secondary | ICD-10-CM

## 2020-11-04 DIAGNOSIS — G8929 Other chronic pain: Secondary | ICD-10-CM

## 2020-11-04 DIAGNOSIS — Z23 Encounter for immunization: Secondary | ICD-10-CM

## 2020-11-04 MED ORDER — HYDROCHLOROTHIAZIDE 25 MG PO TABS: 25.0000 mg | ORAL_TABLET | Freq: Every day | ORAL | 1 refills | Status: DC

## 2020-11-04 MED ORDER — CARVEDILOL 12.5 MG PO TABS: 12.5000 mg | ORAL_TABLET | Freq: Two times a day (BID) | ORAL | 1 refills | Status: DC

## 2020-11-04 MED ORDER — CARVEDILOL 12.5 MG PO TABS: 12.5000 mg | ORAL_TABLET | Freq: Two times a day (BID) | ORAL | 3 refills | Status: DC

## 2020-11-04 MED ORDER — AMLODIPINE BESYLATE 10 MG PO TABS: 10.0000 mg | ORAL_TABLET | Freq: Every day | ORAL | 1 refills | Status: DC

## 2020-11-04 NOTE — Patient Instructions (Signed)
https://doi.org/10.23970/AHRQEPCCER227">  Chronic Back Pain When back pain lasts longer than 3 months, it is called chronic back pain. The cause of your back pain may not be known. Some common causes include:  Wear and tear (degenerative disease) of the bones, ligaments, or disks in your back.  Inflammation and stiffness in your back (arthritis). People who have chronic back pain often go through certain periods in which the pain is more intense (flare-ups). Many people can learn to manage the pain with home care. Follow these instructions at home: Pay attention to any changes in your symptoms. Take these actions to help with your pain: Managing pain and stiffness  If directed, apply ice to the painful area. Your health care provider may recommend applying ice during the first 24-48 hours after a flare-up begins. To do this: ? Put ice in a plastic bag. ? Place a towel between your skin and the bag. ? Leave the ice on for 20 minutes, 2-3 times per day.  If directed, apply heat to the affected area as often as told by your health care provider. Use the heat source that your health care provider recommends, such as a moist heat pack or a heating pad. ? Place a towel between your skin and the heat source. ? Leave the heat on for 20-30 minutes. ? Remove the heat if your skin turns bright red. This is especially important if you are unable to feel pain, heat, or cold. You may have a greater risk of getting burned.  Try soaking in a warm tub.      Activity  Avoid bending and other activities that make the problem worse.  Maintain a proper position when standing or sitting: ? When standing, keep your upper back and neck straight, with your shoulders pulled back. Avoid slouching. ? When sitting, keep your back straight and relax your shoulders. Do not round your shoulders or pull them backward.  Do not sit or stand in one place for long periods of time.  Take brief periods of rest  throughout the day. This will reduce your pain. Resting in a lying or standing position is usually better than sitting to rest.  When you are resting for longer periods, mix in some mild activity or stretching between periods of rest. This will help to prevent stiffness and pain.  Get regular exercise. Ask your health care provider what activities are safe for you.  Do not lift anything that is heavier than 10 lb (4.5 kg), or the limit that you are told, until your health care provider says that it is safe. Always use proper lifting technique, which includes: ? Bending your knees. ? Keeping the load close to your body. ? Avoiding twisting.  Sleep on a firm mattress in a comfortable position. Try lying on your side with your knees slightly bent. If you lie on your back, put a pillow under your knees.   Medicines  Treatment may include medicines for pain and inflammation taken by mouth or applied to the skin, prescription pain medicine, or muscle relaxants. Take over-the-counter and prescription medicines only as told by your health care provider.  Ask your health care provider if the medicine prescribed to you: ? Requires you to avoid driving or using machinery. ? Can cause constipation. You may need to take these actions to prevent or treat constipation:  Drink enough fluid to keep your urine pale yellow.  Take over-the-counter or prescription medicines.  Eat foods that are high in fiber, such as   beans, whole grains, and fresh fruits and vegetables.  Limit foods that are high in fat and processed sugars, such as fried or sweet foods. General instructions  Do not use any products that contain nicotine or tobacco, such as cigarettes, e-cigarettes, and chewing tobacco. If you need help quitting, ask your health care provider.  Keep all follow-up visits as told by your health care provider. This is important. Contact a health care provider if:  You have pain that is not relieved with  rest or medicine.  Your pain gets worse, or you have new pain.  You have a high fever.  You have rapid weight loss.  You have trouble doing your normal activities. Get help right away if:  You have weakness or numbness in one or both of your legs or feet.  You have trouble controlling your bladder or your bowels.  You have severe back pain and have any of the following: ? Nausea or vomiting. ? Pain in your abdomen. ? Shortness of breath or you faint. Summary  Chronic back pain is back pain that lasts longer than 3 months.  When a flare-up begins, apply ice to the painful area for the first 24-48 hours.  Apply a moist heat pad or use a heating pad on the painful area as directed by your health care provider.  When you are resting for longer periods, mix in some mild activity or stretching between periods of rest. This will help to prevent stiffness and pain. This information is not intended to replace advice given to you by your health care provider. Make sure you discuss any questions you have with your health care provider. Document Revised: 07/18/2019 Document Reviewed: 07/18/2019 Elsevier Patient Education  2021 Elsevier Inc.  

## 2020-11-04 NOTE — Progress Notes (Signed)
Established Patient Office Visit  Subjective:  Patient ID: Logan Brewer, male    DOB: [birth year]  Age: 65 y.o. MRN: [id number]  CC:  Chief Complaint  Patient presents with  . Foot Pain  . Back Pain    HPI Logan Brewer nis a 65 year old male who  presents for complaints of bilateral feet pain and lower back- unable to work due to the pain. He is a Higher education careers adviser, mud, paint, and low back pain. He has elevated Bp refilled medication. Denies shortness of breath, headaches, chest pain or lower extremity edema.   Past Medical History:  Diagnosis Date  . Hydrocele in adult   . Hypertension   . Inguinal hernia     Past Surgical History:  Procedure Laterality Date  . APPENDECTOMY    . HYDROCELE EXCISION Bilateral 11/21/2019   Procedure: HYDROCELECTOMY ADULT;  Surgeon: Cleon Gustin, MD;  Location: [facility];  Service: Urology;  Laterality: Bilateral;  [id number]  . HYDROCELE EXCISION Right 04/07/2020   Procedure: RIGHT HYDROCELECTOMY;  Surgeon: Cleon Gustin, MD;  Location: [facility];  Service: Urology;  Laterality: Right;  . INGUINAL HERNIA REPAIR Left 11/21/2019   Procedure: HERNIA REPAIR INGUINAL ADULT;  Surgeon: Aviva Signs, MD;  Location: [facility];  Service: General;  Laterality: Left;  [id number]    History reviewed. No pertinent family history.  Social History   Socioeconomic History  . Marital status: Single    Spouse name: Not on file  . Number of children: Not on file  . Years of education: Not on file  . Highest education level: Not on file  Occupational History  . Not on file  Tobacco Use  . Smoking status: Current Every Day Smoker    Packs/day: 0.50    Types: Cigarettes  . Smokeless tobacco: Never Used  Vaping Use  . Vaping Use: Never used  Substance and Sexual Activity  . Alcohol use: No  . Drug use: Yes    Types: Marijuana    Comment: occasionally   . Sexual activity: Not on file  Other Topics Concern  . Not on file  Social  History Narrative  . Not on file   Social Determinants of Health   Financial Resource Strain: Not on file  Food Insecurity: Not on file  Transportation Needs: Not on file  Physical Activity: Not on file  Stress: Not on file  Social Connections: Not on file  Intimate Partner Violence: Not on file    Outpatient Medications Prior to Visit  Medication Sig Dispense Refill  . HYDROcodone-acetaminophen (NORCO/VICODIN) 5-325 MG tablet One tablet every four hours for pain. 30 tablet 0  . naproxen (NAPROSYN) 500 MG tablet Take 1 tablet (500 mg total) by mouth 2 (two) times daily with a meal. 60 tablet 5  . PARoxetine (PAXIL) 10 MG tablet TAKE 1 TABLET BY MOUTH EVERY DAY AS NEEDED 90 tablet 2  . sildenafil (VIAGRA) 100 MG tablet Take 1 tablet (100 mg total) by mouth daily as needed for erectile dysfunction. 10 tablet 5  . amLODipine (NORVASC) 10 MG tablet Take 1 tablet (10 mg total) by mouth daily. 30 tablet o  . hydrochlorothiazide (HYDRODIURIL) 25 MG tablet Take 1 tablet (25 mg total) by mouth daily. 30 tablet 0   No facility-administered medications prior to visit.    No Known Allergies  ROS Review of Systems  Musculoskeletal: Positive for back pain.       Bilateral feet pain   All other  systems reviewed and are negative.     Objective:    Physical Exam General: No apparent distress. Overweight male  Eyes: Extraocular eye movements intact, pupils equal and round. Neck: Supple, trachea midline. Thyroid: No enlargement, mobile without fixation, no tenderness. Cardiovascular: Regular rhythm and rate, no murmur, normal radial pulses. Respiratory: Normal respiratory effort, clear to auscultation. Gastrointestinal: Normal pitch active bowel sounds, nontender abdomen without distention or appreciable hepatomegaly. Neurologic: normal  deep tendon reflexes  Musculoskeletal: Normal muscle tone, no tenderness on palpation of tibia, no excessive thoracic kyphosis. Skin: Appropriate  warmth, no visible rash. Mental status: Alert, conversant, speech clear, thought logical, appropriate mood and affect, no hallucinations or delusions evident. Hematologic/lymphatic: No cervical adenopathy, no visible ecchymoses. BP (!) 172/70 (BP Location: Right Arm, Patient Position: Sitting, Cuff Size: Normal)   Pulse 72   Temp 97.7 F (36.5 C) (Temporal)   Ht 5' 7" (1.702 m)   Wt 187 lb 9.6 oz (85.1 kg)   SpO2 97%   BMI 29.38 kg/m  Wt Readings from Last 3 Encounters:  11/04/20 187 lb 9.6 oz (85.1 kg)  07/24/20 178 lb (80.7 kg)  07/10/20 174 lb (78.9 kg)   Health Maintenance Due  Topic Date Due  . COVID-19 Vaccine (1) Never done  . Hepatitis C Screening  Never done  . COLONOSCOPY (Pts 45-8yr Insurance coverage will need to be confirmed)  Never done    There are no preventive care reminders to display for this patient.  No results found for: TSH Lab Results  Component Value Date   WBC 6.5 11/16/2019   HGB 15.9 11/16/2019   HCT 46.4 11/16/2019   MCV 96.5 11/16/2019   PLT 242 11/16/2019   Lab Results  Component Value Date   NA 134 (L) 04/04/2020   K 3.4 (L) 04/04/2020   CO2 23 04/04/2020   GLUCOSE 195 (H) 04/04/2020   BUN 21 04/04/2020   CREATININE 1.23 04/04/2020   BILITOT 0.2 (L) 04/25/2013   ALKPHOS 74 04/25/2013   AST 12 04/25/2013   ALT 14 04/25/2013   PROT 7.0 04/25/2013   ALBUMIN 3.2 (L) 04/25/2013   CALCIUM 9.2 04/04/2020   ANIONGAP 6 04/04/2020   No results found for: CHOL No results found for: HDL No results found for: LDLCALC No results found for: TRIG No results found for: CHOLHDL No results found for: HGBA1C    Assessment & Plan:  GVitalywas seen today for foot pain and back pain.  Diagnoses and all orders for this visit:  Need for Tdap vaccination -     Tdap vaccine greater than or equal to 7yo IM  Essential primary hypertension Remains uncontrol continue to use tobacco. Occasional beer. Counseled on blood pressure goal of less than  130/80, low-sodium, DASH diet, medication compliance, 150 minutes of moderate intensity exercise per week. Discussed medication compliance, adverse effects. Added coreg 12.5 bid -     amLODipine (NORVASC) 10 MG tablet; Take 1 tablet (10 mg total) by mouth daily. -     hydrochlorothiazide (HYDRODIURIL) 25 MG tablet; Take 1 tablet (25 mg total) by mouth daily. -     Lipid Panel  Hypokalemia Previous review of labs 6 months  -     CMP14+EGFR  Chronic bilateral low back pain, unspecified whether sciatica present BACK PAIN  Location: lumbar/sacral Quality: burning, sharp, shooting, uncomfortable and with paresthesia Onset: worsening Worse with: walking and lifting   Better with: resting  Radiation: down to thighs Trauma:none  Best sitting/standing/leaning forward:  yes Red Flags Fecal/urinary incontinence: no  Numbness/Weakness: yes  Fever/chills/sweats: no  Night pain: no  Unexplained weight loss: yes  No relief with bedrest: no  h/o cancer/immunosuppression: no  IV drug use: no  PMH of osteoporosis or chronic steroid use: no  Previous kidney function decline may only use tylenol until review labs  Other orders -     Discontinue: carvedilol (COREG) 12.5 MG tablet; Take 1 tablet (12.5 mg total) by mouth 2 (two) times daily with a meal. -     carvedilol (COREG) 12.5 MG tablet; Take 1 tablet (12.5 mg total) by mouth 2 (two) times daily with a meal.     Meds ordered this encounter  Medications  . DISCONTD: carvedilol (COREG) 12.5 MG tablet    Sig: Take 1 tablet (12.5 mg total) by mouth 2 (two) times daily with a meal.    Dispense:  60 tablet    Refill:  3  . amLODipine (NORVASC) 10 MG tablet    Sig: Take 1 tablet (10 mg total) by mouth daily.    Dispense:  90 tablet    Refill:  1    Needs appt  . hydrochlorothiazide (HYDRODIURIL) 25 MG tablet    Sig: Take 1 tablet (25 mg total) by mouth daily.    Dispense:  90 tablet    Refill:  1    Needs appt  . carvedilol (COREG) 12.5 MG  tablet    Sig: Take 1 tablet (12.5 mg total) by mouth 2 (two) times daily with a meal.    Dispense:  180 tablet    Refill:  1    Follow-up: Return in about 3 weeks (around 11/25/2020) for Bp f/u .    Kerin Perna, NP

## 2020-11-05 LAB — CMP14+EGFR
ALT: 29 IU/L (ref 0–44)
AST: 20 IU/L (ref 0–40)
Albumin/Globulin Ratio: 1.5 (ref 1.2–2.2)
Albumin: 4.3 g/dL (ref 3.8–4.8)
Alkaline Phosphatase: 79 IU/L (ref 44–121)
BUN/Creatinine Ratio: 13 (ref 10–24)
BUN: 16 mg/dL (ref 8–27)
Bilirubin Total: <0.2 mg/dL (ref 0.0–1.2)
CO2: 24 mmol/L (ref 20–29)
Calcium: 9.6 mg/dL (ref 8.6–10.2)
Chloride: 102 mmol/L (ref 96–106)
Creatinine, Ser: 1.26 mg/dL (ref 0.76–1.27)
Globulin, Total: 2.8 g/dL (ref 1.5–4.5)
Glucose: 130 mg/dL — ABNORMAL HIGH (ref 65–99)
Potassium: 4.1 mmol/L (ref 3.5–5.2)
Sodium: 141 mmol/L (ref 134–144)
Total Protein: 7.1 g/dL (ref 6.0–8.5)
eGFR: 64 mL/min/{1.73_m2} (ref >59)

## 2020-11-05 LAB — LIPID PANEL
Chol/HDL Ratio: 3.9 ratio (ref 0.0–5.0)
Cholesterol, Total: 204 mg/dL — ABNORMAL HIGH (ref 100–199)
HDL: 52 mg/dL (ref >39)
LDL Chol Calc (NIH): 131 mg/dL — ABNORMAL HIGH (ref 0–99)
Triglycerides: 115 mg/dL (ref 0–149)
VLDL Cholesterol Cal: 21 mg/dL (ref 5–40)

## 2020-11-06 ENCOUNTER — Other Ambulatory Visit (INDEPENDENT_AMBULATORY_CARE_PROVIDER_SITE_OTHER): Payer: Self-pay | Admitting: Primary Care

## 2020-11-06 MED ORDER — PRAVASTATIN SODIUM 20 MG PO TABS: 20.0000 mg | ORAL_TABLET | Freq: Every day | ORAL | 1 refills | Status: DC

## 2020-11-07 ENCOUNTER — Telehealth (INDEPENDENT_AMBULATORY_CARE_PROVIDER_SITE_OTHER): Payer: Self-pay

## 2020-11-07 NOTE — Telephone Encounter (Signed)
-----   Message from Grayce Sessions, NP sent at 11/06/2020  7:15 PM EDT ----- Your  cholesterol is elevated this can lead to heart attack and stroke. To lower your number you can decrease your fatty foods, red meat, cheese, milk and increase fiber like whole grains and veggies.  Sent in pravastatin 20mg  take at bedtime

## 2020-11-07 NOTE — Telephone Encounter (Signed)
Patient is aware of elevated cholesterol which can lead to stroke and heart attack. Pravastatin sent to help lower cholesterol, take at bedtime. Advised on dietary changes. He verbalized understanding. Maryjean Morn, CMA

## 2020-11-20 ENCOUNTER — Ambulatory Visit: Payer: Self-pay | Admitting: Family Medicine

## 2020-11-24 ENCOUNTER — Telehealth (INDEPENDENT_AMBULATORY_CARE_PROVIDER_SITE_OTHER): Payer: Self-pay

## 2020-11-24 NOTE — Telephone Encounter (Signed)
Copied from CRM [id number]. Topic: General - Other >> Nov 21, 2020  2:36 PM Logan Brewer wrote: Reason for CRM: Patient called in to inform Ms Randa Evens that he heard from back specialist once but never heard from them again so was not seen for Brewer visit. Please advise >> Nov 21, 2020  4:09 PM Logan Brewer wrote: Patient called back in stating the phone number he was given was not working. I looked up [facility] and found this number [phone], and gave it to the patient to call.  >> Nov 21, 2020  4:03 PM Logan Brewer, Logan Brewer wrote: Called patient and let him know that Logan Brewer had reached out to him and I advised him to reach out to [place] for scheduling at [phone]. Patient understood and thanked me.  Marland Kitchen

## 2020-11-25 ENCOUNTER — Other Ambulatory Visit: Payer: Self-pay

## 2020-11-25 ENCOUNTER — Ambulatory Visit: Payer: Self-pay | Admitting: Family

## 2020-11-25 ENCOUNTER — Ambulatory Visit: Payer: Self-pay | Admitting: Orthopaedic Surgery

## 2020-11-25 ENCOUNTER — Encounter (INDEPENDENT_AMBULATORY_CARE_PROVIDER_SITE_OTHER): Payer: Self-pay | Admitting: Primary Care

## 2020-11-25 ENCOUNTER — Ambulatory Visit (INDEPENDENT_AMBULATORY_CARE_PROVIDER_SITE_OTHER): Payer: Self-pay | Admitting: Primary Care

## 2020-11-25 VITALS — BP 151/76 | HR 77 | Temp 97.5°F | Ht 67.0 in | Wt 184.2 lb

## 2020-11-25 DIAGNOSIS — Z1211 Encounter for screening for malignant neoplasm of colon: Secondary | ICD-10-CM

## 2020-11-25 DIAGNOSIS — Z8701 Personal history of pneumonia (recurrent): Secondary | ICD-10-CM

## 2020-11-25 DIAGNOSIS — Z23 Encounter for immunization: Secondary | ICD-10-CM

## 2020-11-25 DIAGNOSIS — G8929 Other chronic pain: Secondary | ICD-10-CM

## 2020-11-25 DIAGNOSIS — Z9189 Other specified personal risk factors, not elsewhere classified: Secondary | ICD-10-CM

## 2020-11-25 DIAGNOSIS — M545 Low back pain, unspecified: Secondary | ICD-10-CM

## 2020-11-25 DIAGNOSIS — I10 Essential (primary) hypertension: Secondary | ICD-10-CM

## 2020-11-25 MED ORDER — CELECOXIB 100 MG PO CAPS: 100.0000 mg | ORAL_CAPSULE | Freq: Two times a day (BID) | ORAL | 1 refills | Status: DC

## 2020-11-25 NOTE — Progress Notes (Signed)
[facility]  Subjective: CC: back pain PCP: Grayce Sessions, NP HPI: Patient is a 65 y.o. male presenting to clinic today for back pain. Concerns today include:  1. Back Pain Patient reports that pain began 5 months h/o back pain.( state was told he had a slipped disc)  Pain is a 9/10.  It radiates from lower back down legs.  Walking and standing  worsens pain.  Nothing improves pain.  Patient has been taking Naprosyn  for pain with some  relief.  Patient denies trauma or injury. Denies dysuria, hematuria, fevers, chills, nausea, vomiting, abdominal pain, renal stones. Aggravating factors: certain movements and prolonged walking/standing. Alleviating factors: rest. Progressive LE weakness or saddle anesthesia:none. Extremity sensation changes or weakness: none. Ambulatory with difficulty. Normal bowel/bladder habits: yes; without urinary retention. Normal PO intake without n/v. No associated abdominal pain/n/v. Self treatment: has OTC analgesics, with minimal relief. Patient denies: urinary retention/incontinence, bowel incontinence, weakness, falls, sensation changes or pain anywhere else. No h/o back surgeries.( lumbar injections)    Current Outpatient Medications:  .  amLODipine (NORVASC) 10 MG tablet, Take 1 tablet (10 mg total) by mouth daily., Disp: 90 tablet, Rfl: 1 .  carvedilol (COREG) 12.5 MG tablet, Take 1 tablet (12.5 mg total) by mouth 2 (two) times daily with a meal., Disp: 180 tablet, Rfl: 1 .  hydrochlorothiazide (HYDRODIURIL) 25 MG tablet, Take 1 tablet (25 mg total) by mouth daily., Disp: 90 tablet, Rfl: 1 .  HYDROcodone-acetaminophen (NORCO/VICODIN) 5-325 MG tablet, One tablet every four hours for pain., Disp: 30 tablet, Rfl: 0 .  naproxen (NAPROSYN) 500 MG tablet, Take 1 tablet (500 mg total) by mouth 2 (two) times daily with a meal., Disp: 60 tablet, Rfl: 5 .  PARoxetine (PAXIL) 10 MG tablet, TAKE 1 TABLET BY MOUTH EVERY DAY AS NEEDED, Disp: 90 tablet,  Rfl: 2 .  pravastatin (PRAVACHOL) 20 MG tablet, Take 1 tablet (20 mg total) by mouth daily., Disp: 90 tablet, Rfl: 1 No Known Allergies  Past Medical History:  Diagnosis Date  . Hydrocele in adult   . Hypertension   . Inguinal hernia    Social History   Socioeconomic History  . Marital status: Single    Spouse name: Not on file  . Number of children: Not on file  . Years of education: Not on file  . Highest education level: Not on file  Occupational History  . Not on file  Tobacco Use  . Smoking status: Current Every Day Smoker    Packs/day: 0.50    Types: Cigarettes  . Smokeless tobacco: Never Used  Vaping Use  . Vaping Use: Never used  Substance and Sexual Activity  . Alcohol use: No  . Drug use: Yes    Types: Marijuana    Comment: occasionally   . Sexual activity: Not on file  Other Topics Concern  . Not on file  Social History Narrative  . Not on file   Social Determinants of Health   Financial Resource Strain: Not on file  Food Insecurity: Not on file  Transportation Needs: Not on file  Physical Activity: Not on file  Stress: Not on file  Social Connections: Not on file  Intimate Partner Violence: Not on file   Past Surgical History:  Procedure Laterality Date  . APPENDECTOMY    . HYDROCELE EXCISION Bilateral 11/21/2019   Procedure: HYDROCELECTOMY ADULT;  Surgeon: Malen Gauze, MD;  Location: [facility];  Service: Urology;  Laterality: Bilateral;  [id number]  .  HYDROCELE EXCISION Right 04/07/2020   Procedure: RIGHT HYDROCELECTOMY;  Surgeon: Malen Gauze, MD;  Location: [facility];  Service: Urology;  Laterality: Right;  . INGUINAL HERNIA REPAIR Left 11/21/2019   Procedure: HERNIA REPAIR INGUINAL ADULT;  Surgeon: [name], MD;  Location: [facility];  Service: General;  Laterality: Left;  [id number]   ROS: per HPI  Objective: Office vital signs reviewed. BP (!) 151/76 (BP Location: Right Arm, Patient Position: Sitting, Cuff Size: Normal)   Pulse 77    Temp (!) 97.5 F (36.4 C) (Temporal)   Ht 5\' 7"  (1.702 m)   Wt 184 lb 3.2 oz (83.6 kg)   SpO2 96%   BMI 28.85 kg/m   Physical Examination:  General: Awake, alert, well nourished, NAD Eyes: Extraocular eye movements intact, pupils equal and round. Neck: Supple, trachea midline. Thyroid: No enlargement, mobile without fixation, no tenderness. Cardio: Regular rate and rhythm, S1S2 heard, no murmurs appreciated Pulm: Clear to auscultation bilaterally, no wheezes, rhonchi or rales Extremities: Warm, well-perfused. No edema, cyanosis or clubbing; + pulses bilaterally Musculoskeletal: Normal muscle tone,  tenderness on palpation of lower back , no excessive thoracic kyphosis. Skin: Appropriate warmth, no visible rash. Mental status: Alert, conversant, speech clear, thought logical, appropriate mood and affect, no hallucinations or delusions evident. Hematologic/lymphatic: No cervical adenopathy, no visible ecchymoses.  Assessment/ Plan: Logan Brewer was seen today for blood pressure check and pain.  Diagnoses and all orders for this visit:  Essential primary hypertension Counseled on blood pressure goal of less than 130/80, low-sodium, DASH diet, medication compliance, 150 minutes of moderate intensity exercise per week. Discussed medication compliance, adverse effects. Discontinue Viagra elevated Bp not prescribe by PCP. Consider Cialis later.   Chronic bilateral low back pain, unspecified whether sciatica present Discontinue naproxen drug drug interaction with Paxil. Changed to Celebrex 100mg  BID See HPI   [facility] had reached out to him and call [facility] for scheduling at [phone]  schedule appt. June 14,2021   Colon cancer screening -     Fecal occult blood, imunochemical; Future  History of pneumonia as indication for 23-polyvalent pneumococcal polysaccharide vaccine [facility] maintenance    Return in about 3 months (around 02/25/2021) for Bp/fasting labs .   The  above assessment and management plan was discussed with the patient. The patient verbalized understanding of and has agreed to the management plan. Patient is aware to call the clinic if symptoms persist or worsen. Patient is aware when to return to the clinic for a follow-up visit. Patient educated on when it is appropriate to go to the emergency department.   This note has been created with June 16,2021. Any transcriptional errors are unintentional.   04/27/2021, NP 11/25/2020, 9:45 AM

## 2020-11-27 NOTE — Telephone Encounter (Signed)
Called patient he has an appt schedule June 14,2022 with ortho

## 2020-12-04 ENCOUNTER — Encounter: Payer: Self-pay | Admitting: Orthopedic Surgery

## 2020-12-04 ENCOUNTER — Ambulatory Visit: Payer: Self-pay

## 2020-12-04 ENCOUNTER — Ambulatory Visit (INDEPENDENT_AMBULATORY_CARE_PROVIDER_SITE_OTHER): Payer: Self-pay | Admitting: Orthopedic Surgery

## 2020-12-04 DIAGNOSIS — M5442 Lumbago with sciatica, left side: Secondary | ICD-10-CM

## 2020-12-04 DIAGNOSIS — G8929 Other chronic pain: Secondary | ICD-10-CM

## 2020-12-04 DIAGNOSIS — M5441 Lumbago with sciatica, right side: Secondary | ICD-10-CM

## 2020-12-04 DIAGNOSIS — M545 Low back pain, unspecified: Secondary | ICD-10-CM

## 2020-12-04 MED ORDER — PREDNISONE 10 MG PO TABS: 10.0000 mg | ORAL_TABLET | Freq: Every day | ORAL | 1 refills | Status: DC

## 2020-12-04 NOTE — Progress Notes (Signed)
Office Visit Note   Patient: Logan Brewer           Date of Birth: [birth year]           MRN: [id number] Visit Date: 12/04/2020              Requested [place]: [place] [address] [place],  [place] [address] PCP: Grayce Sessions, NP  Chief Complaint  Patient presents with   Lower Back - Pain      HPI: Patient is a 65 year old gentleman who presents complaining of lower back pain with bilateral radicular symptoms down both legs to the lateral calf.  Patient states he is not taking any medicines for it he denies any injury.  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  Patient states he has had epidural steroids in the past that has helped.  His last MRI scan was in 2014.  Assessment & Plan: Visit Diagnoses:  1. Low back pain, unspecified back pain laterality, unspecified chronicity, unspecified whether sciatica present   2. Chronic low back pain with bilateral sciatica, unspecified back pain laterality     Plan: We will start him on a low-dose prednisone 10 mg with breakfast daily follow-up in 3 weeks.  Discussed with his new radicular symptoms we would need to repeat Cindee Lame an MRI scan to evaluate for repeat epidural steroid injections.  Follow-Up Instructions: Return in about 3 weeks (around 12/25/2020).   Ortho Exam  Patient is alert, oriented, no adenopathy, well-dressed, normal affect, normal respiratory effort. Examination patient has an antalgic gait he has a positive straight leg raise bilaterally.  There is no focal motor weakness in either lower extremity.  Imaging: XR Lumbar Spine 2-3 Views  Result Date: 12/04/2020 2 view radiographs of the lumbar spine shows osteophytic bone spurs anteriorly with joint space narrowing some calcification of the aorta with large osteophytic bone spurs on the [facility] view as well.  No images are attached to the encounter.  Labs: No results found for: HGBA1C, ESRSEDRATE, CRP, LABURIC,  REPTSTATUS, GRAMSTAIN, CULT, LABORGA   Lab Results  Component Value Date   ALBUMIN 4.3 11/04/2020   ALBUMIN 3.2 (L) 04/25/2013    No results found for: MG No results found for: VD25OH  No results found for: PREALBUMIN CBC EXTENDED Latest Ref Rng & Units 11/16/2019 10/19/2018 08/31/2018  WBC 4.0 - 10.5 K/uL 6.5 - 6.5  RBC 4.22 - 5.81 MIL/uL 4.81 - 4.19(L)  HGB 13.0 - 17.0 g/dL 29.9 37.1 69.6  HCT 78.9 - 52.0 % 46.4 44.0 40.3  PLT 150 - 400 K/uL 242 - 264  NEUTROABS 1.7 - 7.7 K/uL 3.1 - -  LYMPHSABS 0.7 - 4.0 K/uL 2.5 - -     There is no height or weight on file to calculate BMI.  Orders:  Orders Placed This Encounter  Procedures   XR Lumbar Spine 2-3 Views   No orders of the defined types were placed in this encounter.    Procedures: No procedures performed  Clinical Data: No additional findings.  ROS:  All other systems negative, except as noted in the HPI. Review of Systems  Objective: Vital Signs: There were no vitals taken for this visit.  Specialty Comments:  No specialty comments available.  PMFS History: Patient Active Problem List   Diagnosis Date Noted   Chronic left shoulder pain 07/03/2020   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   STD (male) 04/25/2020   Erectile dysfunction due  to arterial insufficiency 02/26/2020   Other hydrocele 12/05/2019   Left inguinal hernia    HTN (hypertension) 04/26/2013   Leukocytosis, unspecified 04/26/2013   Radiculopathy, lumbosacral region 04/25/2013   Back pain 04/25/2013   Anxiety 04/25/2013   Past Medical History:  Diagnosis Date   Hydrocele in adult    Hypertension    Inguinal hernia     History reviewed. No pertinent family history.  Past Surgical History:  Procedure Laterality Date   APPENDECTOMY     HYDROCELE EXCISION Bilateral 11/21/2019   Procedure: HYDROCELECTOMY ADULT;  Surgeon: Malen Gauze, MD;  Location: [facility];  Service: Urology;  Laterality: Bilateral;  [id number]   HYDROCELE EXCISION  Right 04/07/2020   Procedure: RIGHT HYDROCELECTOMY;  Surgeon: Malen Gauze, MD;  Location: [facility];  Service: Urology;  Laterality: Right;   INGUINAL HERNIA REPAIR Left 11/21/2019   Procedure: HERNIA REPAIR INGUINAL ADULT;  Surgeon: [name], MD;  Location: [facility];  Service: General;  Laterality: Left;  [id number]   Social History   Occupational History   Not on file  Tobacco Use   Smoking status: Every Day    Packs/day: 0.50    Pack years: 0.00    Types: Cigarettes   Smokeless tobacco: Never  Vaping Use   Vaping Use: Never used  Substance and Sexual Activity   Alcohol use: No   Drug use: Yes    Types: Marijuana    Comment: occasionally    Sexual activity: Not on file

## 2020-12-25 ENCOUNTER — Ambulatory Visit (INDEPENDENT_AMBULATORY_CARE_PROVIDER_SITE_OTHER): Payer: Self-pay | Admitting: Physician Assistant

## 2020-12-25 ENCOUNTER — Encounter: Payer: Self-pay | Admitting: Orthopedic Surgery

## 2020-12-25 DIAGNOSIS — M545 Low back pain, unspecified: Secondary | ICD-10-CM

## 2020-12-25 NOTE — Progress Notes (Signed)
Office Visit Note   Patient: Logan Brewer           Date of Birth: [birth year]           MRN: [id number] Visit Date: 12/25/2020              Requested by: Grayce Sessions, NP [address] [place],  [place] [address] PCP: Grayce Sessions, NP  Chief Complaint  Patient presents with   Lower Back - Follow-up      HPI: Patient presents in follow-up for his lower back pain.  3 weeks ago he was started on a course of prednisone.  He says this is not helped at all.  He continues to have bilateral lower extremity radicular symptoms.  He says it feels like his feet are "dead at times "denies any loss of bowel or bladder function  Assessment & Plan: Visit Diagnoses:  1. Low back pain, unspecified back pain laterality, unspecified chronicity, unspecified whether sciatica present     Plan: Patient has had epidural steroid injections in the past but has not had an MRI since 2014 1 will be ordered today.  Referral for Dr. Alvester Morin to see patient after MRI for possible ESI  Follow-Up Instructions: No follow-ups on file.   Ortho Exam  Patient is alert, oriented, no adenopathy, well-dressed, normal affect, normal respiratory effort. Examination patient walks slowly secondary to pain.  Strength in lower extremities is about 4 out of 5 bilaterally.  Has positive straight leg raise bilaterally.  Imaging: No results found. No images are attached to the encounter.  Labs: No results found for: HGBA1C, ESRSEDRATE, CRP, LABURIC, REPTSTATUS, GRAMSTAIN, CULT, LABORGA   Lab Results  Component Value Date   ALBUMIN 4.3 11/04/2020   ALBUMIN 3.2 (L) 04/25/2013    No results found for: MG No results found for: VD25OH  No results found for: PREALBUMIN CBC EXTENDED Latest Ref Rng & Units 11/16/2019 10/19/2018 08/31/2018  WBC 4.0 - 10.5 K/uL 6.5 - 6.5  RBC 4.22 - 5.81 MIL/uL 4.81 - 4.19(L)  HGB 13.0 - 17.0 g/dL 00.9 38.1 82.9  HCT 93.7 - 52.0 % 46.4 44.0 40.3  PLT 150 - 400 K/uL 242 -  264  NEUTROABS 1.7 - 7.7 K/uL 3.1 - -  LYMPHSABS 0.7 - 4.0 K/uL 2.5 - -     There is no height or weight on file to calculate BMI.  Orders:  Orders Placed This Encounter  Procedures   MR Lumbar Spine w/o contrast   Ambulatory referral to Physical Medicine Rehab   No orders of the defined types were placed in this encounter.    Procedures: No procedures performed  Clinical Data: No additional findings.  ROS:  All other systems negative, except as noted in the HPI. Review of Systems  Objective: Vital Signs: There were no vitals taken for this visit.  Specialty Comments:  No specialty comments available.  PMFS History: Patient Active Problem List   Diagnosis Date Noted   Chronic left shoulder pain 07/03/2020   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   STD (male) 04/25/2020   Erectile dysfunction due to arterial insufficiency 02/26/2020   Other hydrocele 12/05/2019   Left inguinal hernia    HTN (hypertension) 04/26/2013   Leukocytosis, unspecified 04/26/2013   Radiculopathy, lumbosacral region 04/25/2013   Back pain 04/25/2013   Anxiety 04/25/2013   Past Medical History:  Diagnosis Date   Hydrocele in adult    Hypertension    Inguinal hernia     No family  history on file.  Past Surgical History:  Procedure Laterality Date   APPENDECTOMY     HYDROCELE EXCISION Bilateral 11/21/2019   Procedure: HYDROCELECTOMY ADULT;  Surgeon: Malen Gauze, MD;  Location: [facility];  Service: Urology;  Laterality: Bilateral;  [id number]   HYDROCELE EXCISION Right 04/07/2020   Procedure: RIGHT HYDROCELECTOMY;  Surgeon: Malen Gauze, MD;  Location: [facility];  Service: Urology;  Laterality: Right;   INGUINAL HERNIA REPAIR Left 11/21/2019   Procedure: HERNIA REPAIR INGUINAL ADULT;  Surgeon: [name], MD;  Location: [facility];  Service: General;  Laterality: Left;  [id number]   Social History   Occupational History   Not on file  Tobacco Use   Smoking status: Every Day     Packs/day: 0.50    Pack years: 0.00    Types: Cigarettes   Smokeless tobacco: Never  Vaping Use   Vaping Use: Never used  Substance and Sexual Activity   Alcohol use: No   Drug use: Yes    Types: Marijuana    Comment: occasionally    Sexual activity: Not on file

## 2020-12-31 ENCOUNTER — Ambulatory Visit: Payer: Self-pay | Admitting: Urology

## 2021-01-03 ENCOUNTER — Inpatient Hospital Stay: Admission: RE | Admit: 2021-01-03 | Payer: Self-pay | Source: Ambulatory Visit

## 2021-01-06 ENCOUNTER — Other Ambulatory Visit: Payer: Self-pay

## 2021-01-06 ENCOUNTER — Ambulatory Visit (INDEPENDENT_AMBULATORY_CARE_PROVIDER_SITE_OTHER): Payer: Self-pay | Admitting: Primary Care

## 2021-01-06 ENCOUNTER — Ambulatory Visit
Admission: RE | Admit: 2021-01-06 | Discharge: 2021-01-06 | Disposition: A | Discharge status: Home / Self Care | Payer: Self-pay | Source: Ambulatory Visit | Attending: Physician Assistant | Admitting: Physician Assistant

## 2021-01-06 ENCOUNTER — Encounter (INDEPENDENT_AMBULATORY_CARE_PROVIDER_SITE_OTHER): Payer: Self-pay | Admitting: Primary Care

## 2021-01-06 VITALS — BP 158/81 | HR 75 | Temp 97.9°F | Ht 67.0 in | Wt 183.2 lb

## 2021-01-06 DIAGNOSIS — M5417 Radiculopathy, lumbosacral region: Secondary | ICD-10-CM

## 2021-01-06 DIAGNOSIS — M545 Low back pain, unspecified: Secondary | ICD-10-CM

## 2021-01-06 NOTE — Progress Notes (Signed)
Acute Office Visit  Subjective:    Patient ID: Logan Brewer, male    DOB: 1955/06/29, 65 y.o.   MRN: [id number]  Chief Complaint  Patient presents with   Back Pain   HPI  [name] is a  65 y.o. male who presents after a MRI wanting results done earlier today followed and managed by orthopedics and in pain. Advised to call ortho. Past Medical History:  Diagnosis Date   Hydrocele in adult    Hypertension    Inguinal hernia     Past Surgical History:  Procedure Laterality Date   APPENDECTOMY     HYDROCELE EXCISION Bilateral 11/21/2019   Procedure: HYDROCELECTOMY ADULT;  Surgeon: Cleon Gustin, MD;  Location: [facility];  Service: Urology;  Laterality: Bilateral;  [id number]   HYDROCELE EXCISION Right 04/07/2020   Procedure: RIGHT HYDROCELECTOMY;  Surgeon: Cleon Gustin, MD;  Location: [facility];  Service: Urology;  Laterality: Right;   INGUINAL HERNIA REPAIR Left 11/21/2019   Procedure: HERNIA REPAIR INGUINAL ADULT;  Surgeon: Aviva Signs, MD;  Location: [facility];  Service: General;  Laterality: Left;  [id number]    No family history on file.  Social History   Socioeconomic History   Marital status: Single    Spouse name: Not on file   Number of children: Not on file   Years of education: Not on file   Highest education level: Not on file  Occupational History   Not on file  Tobacco Use   Smoking status: Every Day    Packs/day: 0.50    Types: Cigarettes   Smokeless tobacco: Never  Vaping Use   Vaping Use: Never used  Substance and Sexual Activity   Alcohol use: No   Drug use: Yes    Types: Marijuana    Comment: occasionally    Sexual activity: Not on file  Other Topics Concern   Not on file  Social History Narrative   Not on file   Social Determinants of Health   Financial Resource Strain: Not on file  Food Insecurity: Not on file  Transportation Needs: Not on file  Physical Activity: Not on file  Stress: Not on file  Social Connections:  Not on file  Intimate Partner Violence: Not on file    Outpatient Medications Prior to Visit  Medication Sig Dispense Refill   amLODipine (NORVASC) 10 MG tablet Take 1 tablet (10 mg total) by mouth daily. 90 tablet 1   carvedilol (COREG) 12.5 MG tablet Take 1 tablet (12.5 mg total) by mouth 2 (two) times daily with a meal. 180 tablet 1   celecoxib (CELEBREX) 100 MG capsule Take 1 capsule (100 mg total) by mouth 2 (two) times daily. 60 capsule 1   hydrochlorothiazide (HYDRODIURIL) 25 MG tablet Take 1 tablet (25 mg total) by mouth daily. 90 tablet 1   HYDROcodone-acetaminophen (NORCO/VICODIN) 5-325 MG tablet One tablet every four hours for pain. 30 tablet 0   PARoxetine (PAXIL) 10 MG tablet TAKE 1 TABLET BY MOUTH EVERY DAY AS NEEDED 90 tablet 2   pravastatin (PRAVACHOL) 20 MG tablet Take 1 tablet (20 mg total) by mouth daily. 90 tablet 1   predniSONE (DELTASONE) 10 MG tablet Take 1 tablet (10 mg total) by mouth daily with breakfast. 30 tablet 1   No facility-administered medications prior to visit.    No Known Allergies  Review of Systems  Musculoskeletal:  Positive for back pain.       Radiated to feet  All other  systems reviewed and are negative.     Objective:    Physical Exam Vitals reviewed.  Constitutional:      Appearance: Normal appearance.  HENT:     Head: Normocephalic.     Right Ear: External ear normal.     Left Ear: External ear normal.     Nose: Nose normal.  Cardiovascular:     Rate and Rhythm: Normal rate and regular rhythm.  Pulmonary:     Effort: Pulmonary effort is normal.     Breath sounds: Normal breath sounds.  Abdominal:     General: Bowel sounds are normal.     Palpations: Abdomen is soft.  Musculoskeletal:        General: Tenderness present.     Cervical back: Normal range of motion.     Comments: back  Skin:    General: Skin is warm and dry.  Neurological:     Mental Status: He is oriented to person, place, and time.  Psychiatric:         Mood and Affect: Mood normal.        Behavior: Behavior normal.    BP (!) 158/81 (BP Location: Right Arm, Patient Position: Sitting, Cuff Size: Normal)   Pulse 75   Temp 97.9 F (36.6 C) (Temporal)   Ht $R'5\' 7"'Zc$  (1.702 m)   Wt 183 lb 3.2 oz (83.1 kg)   SpO2 96%   BMI 28.69 kg/m  Wt Readings from Last 3 Encounters:  01/06/21 183 lb 3.2 oz (83.1 kg)  11/25/20 184 lb 3.2 oz (83.6 kg)  11/04/20 187 lb 9.6 oz (85.1 kg)    Health Maintenance Due  Topic Date Due   COVID-19 Vaccine (1) Never done   Hepatitis C Screening  Never done   COLONOSCOPY (Pts 45-28yrs Insurance coverage will need to be confirmed)  Never done   Zoster Vaccines- Shingrix (1 of 2) Never done    There are no preventive care reminders to display for this patient.   No results found for: TSH Lab Results  Component Value Date   WBC 6.5 11/16/2019   HGB 15.9 11/16/2019   HCT 46.4 11/16/2019   MCV 96.5 11/16/2019   PLT 242 11/16/2019   Lab Results  Component Value Date   NA 141 11/04/2020   K 4.1 11/04/2020   CO2 24 11/04/2020   GLUCOSE 130 (H) 11/04/2020   BUN 16 11/04/2020   CREATININE 1.26 11/04/2020   BILITOT <0.2 11/04/2020   ALKPHOS 79 11/04/2020   AST 20 11/04/2020   ALT 29 11/04/2020   PROT 7.1 11/04/2020   ALBUMIN 4.3 11/04/2020   CALCIUM 9.6 11/04/2020   ANIONGAP 6 04/04/2020   EGFR 64 11/04/2020   Lab Results  Component Value Date   CHOL 204 (H) 11/04/2020   Lab Results  Component Value Date   HDL 52 11/04/2020   Lab Results  Component Value Date   LDLCALC 131 (H) 11/04/2020   Lab Results  Component Value Date   TRIG 115 11/04/2020   Lab Results  Component Value Date   CHOLHDL 3.9 11/04/2020   No results found for: HGBA1C     Assessment & Plan:  Logan Brewer was seen today for back pain.  Diagnoses and all orders for this visit:  Radiculopathy, lumbosacral region MRI earlier ortho will call with results and advised to call office for pain management      No orders of  the defined types were placed in this encounter.    Sharyn Lull  Harriette Ohara, NP

## 2021-01-12 ENCOUNTER — Encounter: Payer: Self-pay | Admitting: Orthopedic Surgery

## 2021-01-12 ENCOUNTER — Ambulatory Visit (INDEPENDENT_AMBULATORY_CARE_PROVIDER_SITE_OTHER): Payer: Self-pay | Admitting: Orthopedic Surgery

## 2021-01-12 DIAGNOSIS — M5410 Radiculopathy, site unspecified: Secondary | ICD-10-CM

## 2021-01-12 DIAGNOSIS — M545 Low back pain, unspecified: Secondary | ICD-10-CM

## 2021-01-13 ENCOUNTER — Encounter: Payer: Self-pay | Admitting: Orthopedic Surgery

## 2021-01-13 MED ORDER — HYDROCODONE-ACETAMINOPHEN 5-325 MG PO TABS: 1.0000 | ORAL_TABLET | ORAL | 0 refills | Status: DC | PRN

## 2021-01-13 NOTE — Progress Notes (Signed)
Office Visit Note   Patient: Logan Brewer           Date of Birth: Jul 07, 1955           MRN: [id number] Visit Date: 01/12/2021              Requested by: Grayce Sessions, NP [address] [place],  [place] [address] PCP: Grayce Sessions, NP  Chief Complaint  Patient presents with   Lower Back - Pain      HPI: Patient is a 65 year old gentleman who presents in follow-up status post MRI scan of his lumbar spine.  Patient states he has radicular pain from the lumbar spine that radiates down both legs to the lateral aspect of both calfs.  Assessment & Plan: Visit Diagnoses:  1. Low back pain, unspecified back pain laterality, unspecified chronicity, unspecified whether sciatica present   2. Radicular pain of both lower extremities     Plan: We will make a referral to Dr. Alvester Morin for evaluation for epidural steroid injections.  Prescription provided for Vicodin.  Follow-Up Instructions: Return if symptoms worsen or fail to improve.   Ortho Exam  Patient is alert, oriented, no adenopathy, well-dressed, normal affect, normal respiratory effort. Examination patient has radicular pain lateral aspect of both calves.  He has a straight leg raise on the left negative on the right motor strength is 5/5 in all motor groups of both lower extremities.  Review of the MRI scan shows degenerative changes at L4-5 that are worse than on the exam of 2014.  Patient also has left foraminal narrowing at L5-S1 with facet degenerative changes and disc bulge this is consistent with his left-sided symptoms.   Imaging: No results found. No images are attached to the encounter.  Labs: No results found for: HGBA1C, ESRSEDRATE, CRP, LABURIC, REPTSTATUS, GRAMSTAIN, CULT, LABORGA   Lab Results  Component Value Date   ALBUMIN 4.3 11/04/2020   ALBUMIN 3.2 (L) 04/25/2013    No results found for: MG No results found for: VD25OH  No results found for: PREALBUMIN CBC EXTENDED Latest Ref Rng  & Units 11/16/2019 10/19/2018 08/31/2018  WBC 4.0 - 10.5 K/uL 6.5 - 6.5  RBC 4.22 - 5.81 MIL/uL 4.81 - 4.19(L)  HGB 13.0 - 17.0 g/dL 73.5 32.9 92.4  HCT 26.8 - 52.0 % 46.4 44.0 40.3  PLT 150 - 400 K/uL 242 - 264  NEUTROABS 1.7 - 7.7 K/uL 3.1 - -  LYMPHSABS 0.7 - 4.0 K/uL 2.5 - -     There is no height or weight on file to calculate BMI.  Orders:  Orders Placed This Encounter  Procedures   Ambulatory referral to Physical Medicine Rehab   No orders of the defined types were placed in this encounter.    Procedures: No procedures performed  Clinical Data: No additional findings.  ROS:  All other systems negative, except as noted in the HPI. Review of Systems  Objective: Vital Signs: There were no vitals taken for this visit.  Specialty Comments:  No specialty comments available.  PMFS History: Patient Active Problem List   Diagnosis Date Noted   Chronic left shoulder pain 07/03/2020   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   STD (male) 04/25/2020   Erectile dysfunction due to arterial insufficiency 02/26/2020   Other hydrocele 12/05/2019   Left inguinal hernia    HTN (hypertension) 04/26/2013   Leukocytosis, unspecified 04/26/2013   Radiculopathy, lumbosacral region 04/25/2013   Back pain 04/25/2013   Anxiety 04/25/2013   Past Medical  History:  Diagnosis Date   Hydrocele in adult    Hypertension    Inguinal hernia     History reviewed. No pertinent family history.  Past Surgical History:  Procedure Laterality Date   APPENDECTOMY     HYDROCELE EXCISION Bilateral 11/21/2019   Procedure: HYDROCELECTOMY ADULT;  Surgeon: Malen Gauze, MD;  Location: [facility];  Service: Urology;  Laterality: Bilateral;  [id number]   HYDROCELE EXCISION Right 04/07/2020   Procedure: RIGHT HYDROCELECTOMY;  Surgeon: Malen Gauze, MD;  Location: [facility];  Service: Urology;  Laterality: Right;   INGUINAL HERNIA REPAIR Left 11/21/2019   Procedure: HERNIA REPAIR INGUINAL ADULT;   Surgeon: [name], MD;  Location: [facility];  Service: General;  Laterality: Left;  [id number]   Social History   Occupational History   Not on file  Tobacco Use   Smoking status: Every Day    Packs/day: 0.50    Types: Cigarettes   Smokeless tobacco: Never  Vaping Use   Vaping Use: Never used  Substance and Sexual Activity   Alcohol use: No   Drug use: Yes    Types: Marijuana    Comment: occasionally    Sexual activity: Not on file

## 2021-01-14 ENCOUNTER — Institutional Professional Consult (permissible substitution): Payer: Self-pay | Admitting: Physical Medicine and Rehabilitation

## 2021-01-15 ENCOUNTER — Other Ambulatory Visit: Payer: Self-pay

## 2021-01-15 ENCOUNTER — Ambulatory Visit (INDEPENDENT_AMBULATORY_CARE_PROVIDER_SITE_OTHER): Payer: Self-pay | Admitting: Physical Medicine and Rehabilitation

## 2021-01-15 ENCOUNTER — Encounter: Payer: Self-pay | Admitting: Physical Medicine and Rehabilitation

## 2021-01-15 VITALS — BP 156/91 | HR 89

## 2021-01-15 DIAGNOSIS — M5416 Radiculopathy, lumbar region: Secondary | ICD-10-CM

## 2021-01-15 DIAGNOSIS — M48062 Spinal stenosis, lumbar region with neurogenic claudication: Secondary | ICD-10-CM

## 2021-01-15 DIAGNOSIS — M47816 Spondylosis without myelopathy or radiculopathy, lumbar region: Secondary | ICD-10-CM

## 2021-01-15 DIAGNOSIS — M48061 Spinal stenosis, lumbar region without neurogenic claudication: Secondary | ICD-10-CM

## 2021-01-15 NOTE — Progress Notes (Signed)
Logan Brewer - 65 y.o. male MRN [id number]  Date of birth: June 11, 1956  Office Visit Note: Visit Date: 01/15/2021 PCP: Grayce Sessions, NP Referred by: Grayce Sessions, NP  Subjective: Chief Complaint  Patient presents with   Lower Back - Pain   Left Leg - Pain   Right Leg - Pain   Right Foot - Pain   Left Foot - Pain   HPI: Logan Brewer is a 65 y.o. male who comes in today per the request of Dr. Aldean Baker for evaluation of bilateral lower back pain radiating to buttocks and down anterior legs.  Patient reports the pain has been ongoing for 6 months but has become severe over the last several weeks.  Patient describes pain as soreness and shooting, rates 10 out of 10 at present.  Patient reports pain is exacerbated by walking, prolonged standing and lifting objects at work.  Patient reports minimal relief of pain with medications and periods of rest.  Patient's recent lumbar MRI exhibits moderate canal stenosis at L3-4, severe canal and bilateral recess narrowing at L4-L5, multi-level facet arthropathy and severe left foraminal narrowing at L5-S1.  Patient states he was admitted to [facility] in 2014 for lower back pain where he underwent a left L3 nerve block and reports that this did help to relieve his pain. 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 Patient denies focal weakness, numbness and tingling.  No loss of bowel or bladder function. Patient denies recent trauma or falls.  Review of Systems  Musculoskeletal:  Positive for back pain.       Bilateral hip and leg pain  Neurological:  Negative for tingling and focal weakness.  All other systems reviewed and are negative. Otherwise per HPI.  Assessment & Plan: Visit Diagnoses:    ICD-10-CM   1. Spinal stenosis of lumbar region with neurogenic claudication  M48.062     2. Lumbar radiculopathy  M54.16      3. Foraminal stenosis of lumbar region  M48.061     4. Facet arthropathy, lumbar  M47.816        Plan: Findings:  Chronic, worsening and severe bilateral lower back pain radiating to buttocks and down anterior legs.  Patient's imaging and clinical presentation is consistent with classic L4 distribution radiculopathy from lumbar stenosis noted on MRI.  Patient continues to have severe pain with walking and prolonged standing.  We reviewed patient's recent lumbar MRI with imaging and spinal model. We believe the next step is to perform a diagnostic and hopefully therapeutic bilateral L4-L5 transforaminal epidural steroid injection.  We encouraged patient to continue with conservative therapies at home such as medication and periods of rest while at work. We talked with patient in detail about spinal stenosis and also discussed possible surgical interventions. We will re-evaluate pain relief after injection and would then consider referral to neurosurgeon if severe pain continues.  If the patient does get some relief could consider second injection.  If he gets better relief with his legs but not his back would look at diagnostic facet joint blocks.  He will continue with current activity level and activity modification when needed.   Meds & Orders: No orders of the defined types were placed in this encounter.  No orders of the defined types were placed in this encounter.   Follow-up: Return in about 1 week (around 01/22/2021)  for Bilateral L4-L5 transforaminal epidural steroid injection.   Procedures: No procedures performed      Clinical History: MRI LUMBAR SPINE WITHOUT CONTRAST   TECHNIQUE: Multiplanar, multisequence MR imaging of the lumbar spine was performed. No intravenous contrast was administered.   COMPARISON:  MRI and CT of the lumbar spine 04/25/2013.   FINDINGS: Segmentation:  Standard.   Alignment:  Maintained.   Vertebrae: Mixed signal intensity lesion in the posterior  aspect of L2 is unchanged consistent with benign entity such as a hemangioma. No fracture or worrisome lesion is seen. No evidence of discitis.   Conus medullaris and cauda equina: Conus extends to the L2 level. Conus and cauda equina appear normal.   Paraspinal and other soft tissues: Negative.   Disc levels:   T11-12 is imaged in the sagittal plane only and appears normal.   T12-L1: Negative.   L1-2: Shallow left paracentral protrusion without stenosis is unchanged.   L2-3: There is mild to moderate facet degenerative disease and a shallow disc bulge. Previously seen left lateral recess protrusion has almost completely regressed. There is mild to moderate central canal narrowing. Narrowing in the left lateral recess has improved. Mild bilateral foraminal narrowing is seen.   L3-4: Moderate facet degenerative disease and a shallow disc bulge, more prominent to the left. Moderate central canal stenosis and left greater than right mild to moderate foraminal narrowing appear unchanged.   L4-5: Spondylosis is worse than on the prior examination. There has been progression of moderate to moderately severe facet arthropathy, ligamentum flavum thickening and a broad-based disc bulge. There is severe central canal and bilateral subarticular recess narrowing. Moderate bilateral foraminal narrowing is seen.   L5-S1: Left worse than right facet degenerative disease is present. There is a shallow disc bulge to the left. The central canal is open. Severe left foraminal narrowing is worse than on the prior exam. Mild to moderate right foraminal narrowing is unchanged.   IMPRESSION: Degenerative change at L4-5 is worse than on the 2014 exam. There is severe central canal and bilateral subarticular recess narrowing at L4-5. Moderate bilateral foraminal narrowing is also present at L4-5.   Severe left foraminal narrowing at L5-S1 due to disc bulging and facet degenerative disease is  worse than on the prior exam. Mild to moderate right foraminal narrowing appears unchanged.   Left side disc protrusion at L2-3 seen on the prior MRI has largely regressed. There is mild narrowing in the left subarticular recess and mild to moderate central canal stenosis overall.     Electronically Signed   By: Drusilla Kanner M.D.   On: 01/06/2021 15:20   He reports that he has been smoking cigarettes. He has been smoking an average of .5 packs per day. He has never used smokeless tobacco. No results for input(s): HGBA1C, LABURIC in the last 8760 hours.  Objective:  VS:  HT:    WT:   BMI:     BP:(!) 156/91  HR:89bpm  TEMP: ( )  RESP:  Physical Exam HENT:     Head: Normocephalic and atraumatic.     Right Ear: Tympanic membrane normal.     Left Ear: Tympanic membrane normal.     Nose: Nose normal.     Mouth/Throat:     Mouth: Mucous membranes are moist.  Eyes:     Pupils: Pupils are equal, round, and reactive to light.  Cardiovascular:     Rate and Rhythm: Normal rate.     Pulses: Normal pulses.  Pulmonary:     Effort: Pulmonary effort is normal.  Abdominal:     General: Abdomen is flat. There is no distension.  Musculoskeletal:     Cervical back: Normal range of motion and neck supple.     Comments: Pt is slow to rise from seated to standing position. Good lumbar range of motion. Strong distal strength without clonus, no pain upon palpation of greater trochanters. Sensation intact bilaterally. Walks independently, gait steady.    Skin:    General: Skin is warm and dry.     Capillary Refill: Capillary refill takes less than 2 seconds.  Neurological:     General: No focal deficit present.     Mental Status: He is alert.  Psychiatric:        Mood and Affect: Mood normal.    Ortho Exam  Imaging: No results found.  Past Medical/Family/Surgical/Social History: Medications & Allergies reviewed per EMR, new medications updated. Patient Active Problem List    Diagnosis Date Noted   Chronic left shoulder pain 07/03/2020   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   STD (male) 04/25/2020   Erectile dysfunction due to arterial insufficiency 02/26/2020   Other hydrocele 12/05/2019   Left inguinal hernia    HTN (hypertension) 04/26/2013   Leukocytosis, unspecified 04/26/2013   Radiculopathy, lumbosacral region 04/25/2013   Back pain 04/25/2013   Anxiety 04/25/2013   Past Medical History:  Diagnosis Date   Hydrocele in adult    Hypertension    Inguinal hernia    History reviewed. No pertinent family history. Past Surgical History:  Procedure Laterality Date   APPENDECTOMY     HYDROCELE EXCISION Bilateral 11/21/2019   Procedure: HYDROCELECTOMY ADULT;  Surgeon: Malen Gauze, MD;  Location: [facility];  Service: Urology;  Laterality: Bilateral;  [id number]   HYDROCELE EXCISION Right 04/07/2020   Procedure: RIGHT HYDROCELECTOMY;  Surgeon: Malen Gauze, MD;  Location: [facility];  Service: Urology;  Laterality: Right;   INGUINAL HERNIA REPAIR Left 11/21/2019   Procedure: HERNIA REPAIR INGUINAL ADULT;  Surgeon: [name], MD;  Location: [facility];  Service: General;  Laterality: Left;  2025-4270   Social History   Occupational History   Not on file  Tobacco Use   Smoking status: Every Day    Packs/day: 0.50    Types: Cigarettes   Smokeless tobacco: Never  Vaping Use   Vaping Use: Never used  Substance and Sexual Activity   Alcohol use: No   Drug use: Yes    Types: Marijuana    Comment: occasionally    Sexual activity: Not on file

## 2021-01-15 NOTE — Progress Notes (Signed)
Pt state lower back pain that travels down both legs and foot. Pt state walking, standing and bending makes the pain worse. Pt state he takes pain meds to help ease his pain.  Numeric Pain Rating Scale and Functional Assessment Average Pain 10 Pain Right Now 10 My pain is constant and aching Pain is worse with: walking, standing Pain improves with: medication   In the last MONTH (on 0-10 scale) has pain interfered with the following?  1. General activity like being  able to carry out your everyday physical activities such as walking, climbing stairs, carrying groceries, or moving a chair?  Rating(10)  2. Relation with others like being able to carry out your usual social activities and roles such as  activities at home, at work and in your community. Rating(10)  3. Enjoyment of life such that you have  been bothered by emotional problems such as feeling anxious, depressed or irritable?  Rating(10)

## 2021-02-09 ENCOUNTER — Ambulatory Visit: Payer: Self-pay | Admitting: Physical Medicine and Rehabilitation

## 2021-02-25 ENCOUNTER — Ambulatory Visit (INDEPENDENT_AMBULATORY_CARE_PROVIDER_SITE_OTHER): Payer: Self-pay | Admitting: Primary Care

## 2021-03-11 ENCOUNTER — Inpatient Hospital Stay (HOSPITAL_COMMUNITY)
Admission: EM | Admit: 2021-03-11 | Discharge: 2021-03-19 | DRG: 246 | Disposition: A | Discharge status: Home Health | Payer: Self-pay | Attending: Internal Medicine | Admitting: Internal Medicine

## 2021-03-11 ENCOUNTER — Emergency Department (HOSPITAL_COMMUNITY): Payer: Self-pay

## 2021-03-11 ENCOUNTER — Encounter (HOSPITAL_COMMUNITY)
Admission: EM | Disposition: A | Discharge status: Home Health | Payer: Self-pay | Source: Home / Self Care | Attending: Internal Medicine

## 2021-03-11 DIAGNOSIS — I213 ST elevation (STEMI) myocardial infarction of unspecified site: Secondary | ICD-10-CM | POA: Diagnosis present

## 2021-03-11 DIAGNOSIS — G931 Anoxic brain damage, not elsewhere classified: Secondary | ICD-10-CM | POA: Diagnosis present

## 2021-03-11 DIAGNOSIS — E876 Hypokalemia: Secondary | ICD-10-CM | POA: Diagnosis present

## 2021-03-11 DIAGNOSIS — I272 Pulmonary hypertension, unspecified: Secondary | ICD-10-CM | POA: Diagnosis present

## 2021-03-11 DIAGNOSIS — K7200 Acute and subacute hepatic failure without coma: Secondary | ICD-10-CM | POA: Diagnosis present

## 2021-03-11 DIAGNOSIS — Z955 Presence of coronary angioplasty implant and graft: Secondary | ICD-10-CM

## 2021-03-11 DIAGNOSIS — J9601 Acute respiratory failure with hypoxia: Secondary | ICD-10-CM | POA: Diagnosis present

## 2021-03-11 DIAGNOSIS — Z978 Presence of other specified devices: Secondary | ICD-10-CM | POA: Diagnosis present

## 2021-03-11 DIAGNOSIS — R570 Cardiogenic shock: Secondary | ICD-10-CM | POA: Diagnosis present

## 2021-03-11 DIAGNOSIS — I4901 Ventricular fibrillation: Principal | ICD-10-CM | POA: Diagnosis present

## 2021-03-11 DIAGNOSIS — Z79899 Other long term (current) drug therapy: Secondary | ICD-10-CM

## 2021-03-11 DIAGNOSIS — I110 Hypertensive heart disease with heart failure: Secondary | ICD-10-CM | POA: Diagnosis present

## 2021-03-11 DIAGNOSIS — J690 Pneumonitis due to inhalation of food and vomit: Secondary | ICD-10-CM | POA: Diagnosis present

## 2021-03-11 DIAGNOSIS — K567 Ileus, unspecified: Secondary | ICD-10-CM

## 2021-03-11 DIAGNOSIS — Z20822 Contact with and (suspected) exposure to covid-19: Secondary | ICD-10-CM | POA: Diagnosis present

## 2021-03-11 DIAGNOSIS — I25119 Atherosclerotic heart disease of native coronary artery with unspecified angina pectoris: Secondary | ICD-10-CM

## 2021-03-11 DIAGNOSIS — I25110 Atherosclerotic heart disease of native coronary artery with unstable angina pectoris: Secondary | ICD-10-CM | POA: Diagnosis present

## 2021-03-11 DIAGNOSIS — I472 Ventricular tachycardia: Secondary | ICD-10-CM

## 2021-03-11 DIAGNOSIS — E872 Acidosis, unspecified: Secondary | ICD-10-CM

## 2021-03-11 DIAGNOSIS — J810 Acute pulmonary edema: Secondary | ICD-10-CM | POA: Diagnosis present

## 2021-03-11 DIAGNOSIS — N179 Acute kidney failure, unspecified: Secondary | ICD-10-CM | POA: Diagnosis present

## 2021-03-11 DIAGNOSIS — J432 Centrilobular emphysema: Secondary | ICD-10-CM | POA: Diagnosis present

## 2021-03-11 DIAGNOSIS — R7303 Prediabetes: Secondary | ICD-10-CM | POA: Diagnosis present

## 2021-03-11 DIAGNOSIS — Z6829 Body mass index (BMI) 29.0-29.9, adult: Secondary | ICD-10-CM

## 2021-03-11 DIAGNOSIS — I255 Ischemic cardiomyopathy: Secondary | ICD-10-CM | POA: Diagnosis present

## 2021-03-11 DIAGNOSIS — R739 Hyperglycemia, unspecified: Secondary | ICD-10-CM | POA: Diagnosis present

## 2021-03-11 DIAGNOSIS — Z23 Encounter for immunization: Secondary | ICD-10-CM

## 2021-03-11 DIAGNOSIS — K559 Vascular disorder of intestine, unspecified: Secondary | ICD-10-CM | POA: Diagnosis present

## 2021-03-11 DIAGNOSIS — Z7982 Long term (current) use of aspirin: Secondary | ICD-10-CM

## 2021-03-11 DIAGNOSIS — Z7902 Long term (current) use of antithrombotics/antiplatelets: Secondary | ICD-10-CM

## 2021-03-11 DIAGNOSIS — Z452 Encounter for adjustment and management of vascular access device: Secondary | ICD-10-CM

## 2021-03-11 DIAGNOSIS — F17210 Nicotine dependence, cigarettes, uncomplicated: Secondary | ICD-10-CM | POA: Diagnosis present

## 2021-03-11 DIAGNOSIS — Z950 Presence of cardiac pacemaker: Secondary | ICD-10-CM

## 2021-03-11 DIAGNOSIS — I462 Cardiac arrest due to underlying cardiac condition: Secondary | ICD-10-CM | POA: Diagnosis present

## 2021-03-11 DIAGNOSIS — R042 Hemoptysis: Secondary | ICD-10-CM | POA: Diagnosis not present

## 2021-03-11 DIAGNOSIS — I5041 Acute combined systolic (congestive) and diastolic (congestive) heart failure: Secondary | ICD-10-CM | POA: Diagnosis present

## 2021-03-11 DIAGNOSIS — I469 Cardiac arrest, cause unspecified: Secondary | ICD-10-CM | POA: Diagnosis present

## 2021-03-11 DIAGNOSIS — I252 Old myocardial infarction: Secondary | ICD-10-CM

## 2021-03-11 DIAGNOSIS — I2109 ST elevation (STEMI) myocardial infarction involving other coronary artery of anterior wall: Secondary | ICD-10-CM

## 2021-03-11 DIAGNOSIS — I4891 Unspecified atrial fibrillation: Secondary | ICD-10-CM | POA: Diagnosis present

## 2021-03-11 HISTORY — PX: CORONARY/GRAFT ACUTE MI REVASCULARIZATION: CATH118305

## 2021-03-11 HISTORY — PX: CORONARY STENT INTERVENTION: CATH118234

## 2021-03-11 HISTORY — PX: RIGHT/LEFT HEART CATH AND CORONARY ANGIOGRAPHY: CATH118266

## 2021-03-11 HISTORY — DX: Nicotine dependence, unspecified, uncomplicated: F17.200

## 2021-03-11 LAB — I-STAT CHEM 8, ED
BUN: 18 mg/dL (ref 8–23)
Calcium, Ion: 1.05 mmol/L — ABNORMAL LOW (ref 1.15–1.40)
Chloride: 106 mmol/L (ref 98–111)
Creatinine, Ser: 1.5 mg/dL — ABNORMAL HIGH (ref 0.61–1.24)
Glucose, Bld: 270 mg/dL — ABNORMAL HIGH (ref 70–99)
HCT: 44 % (ref 39.0–52.0)
Hemoglobin: 15 g/dL (ref 13.0–17.0)
Potassium: 3.6 mmol/L (ref 3.5–5.1)
Sodium: 143 mmol/L (ref 135–145)
TCO2: 19 mmol/L — ABNORMAL LOW (ref 22–32)

## 2021-03-11 LAB — I-STAT VENOUS BLOOD GAS, ED
Acid-base deficit: 18 mmol/L — ABNORMAL HIGH (ref 0.0–2.0)
Bicarbonate: 16 mmol/L — ABNORMAL LOW (ref 20.0–28.0)
Calcium, Ion: 1.04 mmol/L — ABNORMAL LOW (ref 1.15–1.40)
HCT: 43 % (ref 39.0–52.0)
Hemoglobin: 14.6 g/dL (ref 13.0–17.0)
O2 Saturation: 56 %
Potassium: 3.6 mmol/L (ref 3.5–5.1)
Sodium: 143 mmol/L (ref 135–145)
TCO2: 18 mmol/L — ABNORMAL LOW (ref 22–32)
pCO2, Ven: 82.5 mmHg (ref 44.0–60.0)
pH, Ven: 6.896 — CL (ref 7.250–7.430)
pO2, Ven: 50 mmHg — ABNORMAL HIGH (ref 32.0–45.0)

## 2021-03-11 LAB — PROTIME-INR
INR: 1.5 — ABNORMAL HIGH (ref 0.8–1.2)
Prothrombin Time: 17.7 seconds — ABNORMAL HIGH (ref 11.4–15.2)

## 2021-03-11 SURGERY — CORONARY/GRAFT ACUTE MI REVASCULARIZATION
Anesthesia: LOCAL

## 2021-03-11 MED ORDER — EPINEPHRINE 1 MG/10ML IJ SOSY
PREFILLED_SYRINGE | INTRAMUSCULAR | Status: AC | PRN
  Administered 2021-03-11 (×2): 1 mg via INTRAVENOUS

## 2021-03-11 MED ORDER — FENTANYL BOLUS VIA INFUSION
25.0000 ug | INTRAVENOUS | Status: DC | PRN
  Filled 2021-03-11: qty 25

## 2021-03-11 MED ORDER — ROCURONIUM BROMIDE 50 MG/5ML IV SOLN
INTRAVENOUS | Status: AC | PRN
  Administered 2021-03-11: 100 mg via INTRAVENOUS

## 2021-03-11 MED ORDER — ASPIRIN 300 MG RE SUPP
300.0000 mg | Freq: Once | RECTAL | Status: AC
  Administered 2021-03-11: 300 mg via RECTAL

## 2021-03-11 MED ORDER — FENTANYL CITRATE PF 50 MCG/ML IJ SOSY
50.0000 ug | PREFILLED_SYRINGE | Freq: Once | INTRAMUSCULAR | Status: AC
Start: 2021-03-11 — End: 2021-03-11
  Administered 2021-03-11: 50 ug via INTRAVENOUS

## 2021-03-11 MED ORDER — HEPARIN SODIUM (PORCINE) 1000 UNIT/ML IJ SOLN
INTRAMUSCULAR | Status: AC
  Filled 2021-03-11: qty 1

## 2021-03-11 MED ORDER — STERILE WATER FOR INJECTION IV SOLN
INTRAVENOUS | Status: DC
  Filled 2021-03-11 (×3): qty 1000

## 2021-03-11 MED ORDER — LIDOCAINE IN D5W 4-5 MG/ML-% IV SOLN
1.0000 mg/min | INTRAVENOUS | Status: DC
  Administered 2021-03-11: 1 mg/min via INTRAVENOUS
  Filled 2021-03-11: qty 500

## 2021-03-11 MED ORDER — LIDOCAINE HCL (PF) 1 % IJ SOLN
INTRAMUSCULAR | Status: AC
  Filled 2021-03-11: qty 30

## 2021-03-11 MED ORDER — NOREPINEPHRINE 4 MG/250ML-% IV SOLN
0.0000 ug/min | INTRAVENOUS | Status: DC
Start: 2021-03-11 — End: 2021-03-12
  Administered 2021-03-11: 3 ug/min via INTRAVENOUS

## 2021-03-11 MED ORDER — SODIUM BICARBONATE 8.4 % IV SOLN
100.0000 meq | Freq: Once | INTRAVENOUS | Status: AC
  Administered 2021-03-11: 100 meq via INTRAVENOUS

## 2021-03-11 MED ORDER — SODIUM CHLORIDE 0.9 % IV SOLN: 250.0000 mL | INTRAVENOUS | Status: DC

## 2021-03-11 MED ORDER — AMIODARONE HCL IN DEXTROSE 360-4.14 MG/200ML-% IV SOLN
30.0000 mg/h | INTRAVENOUS | Status: DC
  Administered 2021-03-12 – 2021-03-14 (×5): 30 mg/h via INTRAVENOUS
  Filled 2021-03-11 (×5): qty 200

## 2021-03-11 MED ORDER — SODIUM BICARBONATE 8.4 % IV SOLN
INTRAVENOUS | Status: AC | PRN
  Administered 2021-03-11: 50 meq via INTRAVENOUS

## 2021-03-11 MED ORDER — MAGNESIUM SULFATE 50 % IJ SOLN
2.0000 g | Freq: Once | INTRAMUSCULAR | Status: AC
  Administered 2021-03-11: 2 g via INTRAVENOUS
  Filled 2021-03-11: qty 4

## 2021-03-11 MED ORDER — NITROGLYCERIN 1 MG/10 ML FOR IR/CATH LAB
INTRA_ARTERIAL | Status: AC
  Filled 2021-03-11: qty 10

## 2021-03-11 MED ORDER — FENTANYL 2500MCG IN NS 250ML (10MCG/ML) PREMIX INFUSION
25.0000 ug/h | INTRAVENOUS | Status: DC
  Administered 2021-03-11: 100 ug/h via INTRAVENOUS
  Filled 2021-03-11: qty 250

## 2021-03-11 MED ORDER — HEPARIN SODIUM (PORCINE) 5000 UNIT/ML IJ SOLN: 60.0000 [IU]/kg | Freq: Once | INTRAMUSCULAR | Status: DC

## 2021-03-11 MED ORDER — HEPARIN (PORCINE) IN NACL 1000-0.9 UT/500ML-% IV SOLN
INTRAVENOUS | Status: AC
  Filled 2021-03-11: qty 1000

## 2021-03-11 MED ORDER — ETOMIDATE 2 MG/ML IV SOLN
INTRAVENOUS | Status: AC | PRN
  Administered 2021-03-11: 20 mg via INTRAVENOUS

## 2021-03-11 MED ORDER — HEPARIN SODIUM (PORCINE) 5000 UNIT/ML IJ SOLN
4000.0000 [IU] | Freq: Once | INTRAMUSCULAR | Status: AC
  Administered 2021-03-11: 4000 [IU] via INTRAVENOUS

## 2021-03-11 MED ORDER — FENTANYL CITRATE PF 50 MCG/ML IJ SOSY
PREFILLED_SYRINGE | INTRAMUSCULAR | Status: AC
  Filled 2021-03-11: qty 1

## 2021-03-11 MED ORDER — VERAPAMIL HCL 2.5 MG/ML IV SOLN
INTRAVENOUS | Status: AC
  Filled 2021-03-11: qty 2

## 2021-03-11 MED ORDER — AMIODARONE HCL IN DEXTROSE 360-4.14 MG/200ML-% IV SOLN
60.0000 mg/h | INTRAVENOUS | Status: AC
  Administered 2021-03-11: 60 mg/h via INTRAVENOUS
  Filled 2021-03-11: qty 200

## 2021-03-11 SURGICAL SUPPLY — 22 items
BALLN SAPPHIRE 2.5X15 (BALLOONS) ×2
BALLN SAPPHIRE ~~LOC~~ 3.25X15 (BALLOONS) ×1 IMPLANT
BALLOON SAPPHIRE 2.5X15 (BALLOONS) IMPLANT
CATH 5FR JL3.5 JR4 ANG PIG MP (CATHETERS) ×1 IMPLANT
CATH SWAN GANZ VIP 7.5F (CATHETERS) ×1 IMPLANT
CATH VISTA GUIDE 6FR XBLAD3.0 (CATHETERS) ×1 IMPLANT
GLIDESHEATH SLEND SS 6F .021 (SHEATH) ×1 IMPLANT
GUIDEWIRE INQWIRE 1.5J.035X260 (WIRE) IMPLANT
INQWIRE 1.5J .035X260CM (WIRE) ×2
KIT ENCORE 26 ADVANTAGE (KITS) ×1 IMPLANT
KIT HEART LEFT (KITS) ×2 IMPLANT
PACK CARDIAC CATHETERIZATION (CUSTOM PROCEDURE TRAY) ×2 IMPLANT
SHEATH PINNACLE 6F 10CM (SHEATH) ×1 IMPLANT
SHEATH PINNACLE 8F 10CM (SHEATH) ×1 IMPLANT
SHEATH PROBE COVER 6X72 (BAG) ×1 IMPLANT
SLEEVE REPOSITIONING LENGTH 30 (MISCELLANEOUS) ×1 IMPLANT
STENT ONYX FRONTIER 2.75X18 (Permanent Stent) ×1 IMPLANT
SYR MEDRAD MARK 7 150ML (SYRINGE) ×2 IMPLANT
TRANSDUCER W/STOPCOCK (MISCELLANEOUS) ×2 IMPLANT
TUBING CIL FLEX 10 FLL-RA (TUBING) ×2 IMPLANT
WIRE ASAHI PROWATER 180CM (WIRE) ×1 IMPLANT
WIRE J 3MM .035X145CM (WIRE) ×1 IMPLANT

## 2021-03-11 NOTE — ED Notes (Signed)
Pulse check, no palpated pulses, compressions continued

## 2021-03-11 NOTE — ED Provider Notes (Signed)
[facility] EMERGENCY DEPARTMENT Provider Note   CSN: [id number] Arrival date & time: 03/11/21  2232     History Chief Complaint  Patient presents with   Cardiac Arrest    Logan Brewer is a 65 y.o. male.  ViablePatient presented as a cardiac arrest from the field.  Per EMS patient had complained of some chest pain.  He was getting into the car with his wife when he suddenly became unresponsive and EMS was called.  On arrival patient was reported as having agonal breathing and in cardiac arrest.  Initial rhythm reported as PEA.  EMS states that as they were going through CPR, he went into V. fib rhythm several times and he was shocked 3-4 times.  He was given amiodarone 300 and then 150 mg.  He had return of circulation for several minutes, however just prior to arriving in the ER the last circulation again and resume CPR.      No past medical history on file.  There are no problems to display for this patient.      No family history on file.     Home Medications Prior to Admission medications   Not on File    Allergies    Patient has no allergy information on record.  Review of Systems   Review of Systems  Unable to perform ROS: Acuity of condition   Physical Exam Updated Vital Signs BP (!) 151/98   Pulse (!) 107   Resp 16   SpO2 90%   Physical Exam Constitutional:      Appearance: He is well-developed.     Comments: Intubated, obtunded  HENT:     Head: Normocephalic.     Nose: Nose normal.  Cardiovascular:     Rate and Rhythm: Tachycardia present.  Pulmonary:     Comments: Intubated, being bagged Skin:    Coloration: Skin is not jaundiced.  Neurological:     Comments: Agonal breathing, intubated, no response to painful stimuli.  Pupils are 3 bilaterally and sluggish.    ED Results / Procedures / Treatments   Labs (all labs ordered are listed, but only abnormal results are displayed) Labs Reviewed  I-STAT CHEM 8, ED -  Abnormal; Notable for the following components:      Result Value   Creatinine, Ser 1.50 (*)    Glucose, Bld 270 (*)    Calcium, Ion 1.05 (*)    TCO2 19 (*)    All other components within normal limits  I-STAT VENOUS BLOOD GAS, ED - Abnormal; Notable for the following components:   pH, Ven 6.896 (*)    pCO2, Ven 82.5 (*)    pO2, Ven 50.0 (*)    Bicarbonate 16.0 (*)    TCO2 18 (*)    Acid-base deficit 18.0 (*)    Calcium, Ion 1.04 (*)    All other components within normal limits  RESP PANEL BY RT-PCR (FLU A&B, COVID) ARPGX2  CBC WITH DIFFERENTIAL/PLATELET  COMPREHENSIVE METABOLIC PANEL  PROTIME-INR  LACTIC ACID, PLASMA  LACTIC ACID, PLASMA  I-STAT ARTERIAL BLOOD GAS, ED  TROPONIN I (HIGH SENSITIVITY)    EKG EKG Interpretation  Date/Time:  Wednesday March 11 2021 22:51:23 EDT Ventricular Rate:  110 PR Interval:    QRS Duration: 233 QT Interval:  359 QTC Calculation: 486 R Axis:   -5 Text Interpretation: Atrial fibrillation Nonspecific intraventricular conduction delay Borderline abnrm T, anterolateral leads diffuse ST elevation leads I,II, avl, ant lat leads Confirmed by [place] (  [id number]) on 03/11/2021 11:01:39 PM  Radiology DG Chest Portable 1 View  Result Date: 03/11/2021 CLINICAL DATA:  Check endotracheal tube placement, unresponsive on way to ER, recent CPR performed EXAM: PORTABLE CHEST 1 VIEW COMPARISON:  None. FINDINGS: Cardiac shadow is within normal limits. Endotracheal tube and gastric catheter are noted. The proximal side port of the gastric catheter is in the distal esophagus. This should be advanced several cm. Diffuse bilateral airspace opacity is noted worst in the right upper lobe likely related to underlying edema. No bony abnormality is seen. IMPRESSION: Diffuse airspace opacity bilaterally worse on the right likely related underlying edema. Endotracheal tube in satisfactory position. Gastric catheter shows proximal side port within the distal esophagus.  This should be advanced several cm deeper into the stomach. Electronically Signed   By: Alcide Clever M.D.   On: 03/11/2021 23:03    Procedures .Critical Care Performed by: Cheryll Cockayne, MD Authorized by: Cheryll Cockayne, MD   Critical care provider statement:    Critical care time (minutes):  30   Critical care time was exclusive of:  Separately billable procedures and treating other patients and teaching time   Critical care was necessary to treat or prevent imminent or life-threatening deterioration of the following conditions:  Respiratory failure and cardiac failure Comments:        Date/Time: 03/11/2021 11:16 PM Performed by: Cheryll Cockayne, MD Comments: Glide scope used.  Etomidate 20 mg and rocuronium 100 mg, 7.5 ET tube secured at 23 at the lips.  Patient was intubated during cardiac arrest in the middle of a code, with on-going CPR.      Medications Ordered in ED Medications  norepinephrine (LEVOPHED) 4mg  in premix infusion (1 mcg/min Intravenous Infusion Verify 03/11/21 2315)  fentaNYL 03/13/21 in NS (29mcg/ml) infusion-PREMIX (100 mcg/hr Intravenous New Bag/Given 03/11/21 2300)  fentaNYL (SUBLIMAZE) bolus via infusion 25 mcg (has no administration in time range)  amiodarone (NEXTERONE PREMIX) 360-4.14 MG/200ML-% (1.8 mg/mL) IV infusion (60 mg/hr Intravenous New Bag/Given 03/11/21 2304)  amiodarone (NEXTERONE PREMIX) 360-4.14 MG/200ML-% (1.8 mg/mL) IV infusion (has no administration in time range)  fentaNYL (SUBLIMAZE) 50 MCG/ML injection (has no administration in time range)  heparin injection 60 Units/kg (has no administration in time range)  0.9 %  sodium chloride infusion (has no administration in time range)  sodium bicarbonate injection 100 mEq (has no administration in time range)  sodium bicarbonate 150 mEq in sterile water 1,150 mL infusion (has no administration in time range)  EPINEPHrine (ADRENALIN) 1 MG/10ML injection (1 mg Intravenous Given 03/11/21  2235)  sodium bicarbonate injection (50 mEq Intravenous Given 03/11/21 2234)  etomidate (AMIDATE) injection (20 mg Intravenous Given 03/11/21 2238)  rocuronium (ZEMURON) injection (100 mg Intravenous Given 03/11/21 2239)  fentaNYL (SUBLIMAZE) injection 50 mcg (50 mcg Intravenous Given 03/11/21 2252)  aspirin suppository 300 mg (300 mg Rectal Given 03/11/21 2315)    ED Course  I have reviewed the triage vital signs and the nursing notes.  Pertinent labs & imaging results that were available during my care of the patient were reviewed by me and considered in my medical decision making (see chart for details).    MDM Rules/Calculators/A&P                           On arrival patient continue to be in PEA rhythm.  CPR was continued several rounds of epinephrine given.  Bicarb 1 amp given.  King airway  was removed and glide scope was used to intubate the patient with 7.5 ET tube.  After several rounds, patient had return of spontaneous circulation and CPR was halted.  EKG concerning for ST elevations in leads I, 2, aVL, V to V3 V4.  Case discussed with STEMI cardiologist who will take the patient to Cath Lab. Patient given aspirin suppository and heparin bolus as well as amiodarone drip.  ABG returned severely acidotic pH of 6.89.  Patient given additional 2 Amps of bicarb and bicarb drip started.   Final Clinical Impression(s) / ED Diagnoses Final diagnoses:  Cardiopulmonary arrest (HCC)  Metabolic acidosis  ST elevation myocardial infarction (STEMI), unspecified artery [facility])    Rx / DC Orders ED Discharge Orders     None        Cheryll Cockayne, MD 03/11/21 2317

## 2021-03-11 NOTE — H&P (Addendum)
Cardiology Consultation:   Patient ID: Logan Brewer MRN: [id number]; DOB: 11/24/1950  Admit date: 03/11/2021 Date of Consult: 03/12/2021  PCP:  Grayce Sessions, NP   [facility] Providers Cardiologist:  None        Patient Profile:   Logan Brewer is a 65 y.o. male with a hx of uncontrolled HTN, heavy longtime smoking who is being seen 03/12/2021 for the evaluation of STEMI/VF arrest at the request of Dr Audley Hose, ED.  History of Present Illness:   Logan Brewer developed acute onset chest pain starting ~4:30 pm today new for him. Accompanied by vomiting diaphoresis. Initially abated but reccurred around 10 pm precipitating wife to drive him to the ED. While on route patient coded. EMS called, ~10 minutes from code event to first compressions. Initial rhythm VF. Defibrillated x4 before ROSC. While on route to and in ED coded a second time and received more defibrillations, intubated. Ekg with St elevations in leads V1-V3, aVL, I, III, aVF, ST depressions V4-V6. QRS 232, appears metabolic. No baseline EKG. Started on amiodarone gtt 1/min. Aspirin, heparin 4000U given. VT arrest again x2 defibrillations, later VT with pulse s/p synchronized cardioversion x1. Lidocaine bolus 100 mg and gtt 1/min started. ABG pH 6.9 bicarb 16, pCO2 67.5. Given bicarb boluses and gtt. Satting 96% on 100%/10 of PEEP. POCUS TTE with LVEF ~25-30%, globally hypokinetic, RV function intact, no pericardial effusion, IVC small/collapsible. Taken to cath lab, has 95% prox Lad culprit lesion, otherwise no significant disease, s/p 1 DES to LAD with TIMI 3 flow. Ticag loaded. Swan placed fem vein with RA 20, Pa 46/30 (38), W 28, CO 5.5 both Fick and TD, CI 2.7. SBP120s -> 160s following PCI   No past medical history on file.     Home Medications:  Prior to Admission medications   Not on File    Inpatient Medications: Scheduled Meds:  fentaNYL       Continuous Infusions:  sodium chloride     sodium chloride  10 mL/hr (03/12/21 0006)   amiodarone 60 mg/hr (03/11/21 2304)   amiodarone     fentaNYL infusion INTRAVENOUS 100 mcg/hr (03/11/21 2300)   lidocaine 1 mg/min (03/11/21 2339)   norepinephrine (LEVOPHED) Adult infusion 1 mcg/min (03/11/21 2315)    sodium bicarbonate (isotonic) infusion in sterile water     PRN Meds: sodium chloride, [MAR Hold] fentaNYL, lidocaine (PF), sodium bicarbonate  Allergies:   Not on File  Social History:   Social History   Socioeconomic History   Marital status: Single    Spouse name: Not on file   Number of children: Not on file   Years of education: Not on file   Highest education level: Not on file  Occupational History   Not on file  Tobacco Use   Smoking status: Not on file   Smokeless tobacco: Not on file  Substance and Sexual Activity   Alcohol use: Not on file   Drug use: Not on file   Sexual activity: Not on file  Other Topics Concern   Not on file  Social History Narrative   Not on file   Social Determinants of Health   Financial Resource Strain: Not on file  Food Insecurity: Not on file  Transportation Needs: Not on file  Physical Activity: Not on file  Stress: Not on file  Social Connections: Not on file  Intimate Partner Violence: Not on file    Family History:   No family history on file.   ROS:  Please see the history of present illness.   All other ROS reviewed and negative.     Physical Exam/Data:   Vitals:   03/11/21 2325 03/11/21 2330 03/11/21 2330 03/12/21 0000  BP: 121/84 123/84    Pulse: (!) 101 (!) 103    Resp: (!) 22 (!) 22    Temp:   (!) 97.5 F (36.4 C)   TempSrc:   Oral   SpO2: 97% 96%  98%    Intake/Output Summary (Last 24 hours) at 03/12/2021 0010 Last data filed at 03/11/2021 2315 Gross per 24 hour  Intake 757.06 ml  Output --  Net 757.06 ml   No flowsheet data found.   There is no height or weight on file to calculate BMI.  General:  intubated, sedated HEENT: normal Neck: no  JVD Vascular: No carotid bruits; Distal pulses 2+ bilaterally Cardiac:  normal S1, S2; RRR; no murmur  Lungs:  clear to auscultation bilaterally, no wheezing, rhonchi or rales  Abd: soft, nontender, no hepatomegaly  Ext: no edema Musculoskeletal:  No deformities Skin: warm and dry  Neuro:  sedated Psych:  sedated  EKG:  The EKG was personally reviewed and demonstrates:  STEMI on anteroseptal, lateral, inferior leads, very wide QRS, St depressions in anterolateral leads Telemetry:  Telemetry was personally reviewed and demonstrates:  VT, sinus tachycardia  Relevant CV Studies: N/A  Laboratory Data:  High Sensitivity Troponin:  No results for input(s): TROPONINIHS in the last 720 hours.   Chemistry Recent Labs  Lab 03/11/21 2300  NA 143  143  K 3.6  3.6  CL 106  GLUCOSE 270*  BUN 18  CREATININE 1.50*    No results for input(s): PROT, ALBUMIN, AST, ALT, ALKPHOS, BILITOT in the last 168 hours. Lipids No results for input(s): CHOL, TRIG, HDL, LABVLDL, LDLCALC, CHOLHDL in the last 168 hours.  Hematology Recent Labs  Lab 03/11/21 2245 03/11/21 2300  WBC 9.6  --   RBC 4.27  --   HGB 13.9 15.0  14.6  HCT 44.7 44.0  43.0  MCV 104.7*  --   MCH 32.6  --   MCHC 31.1  --   RDW 13.3  --   PLT 168  --    Thyroid No results for input(s): TSH, FREET4 in the last 168 hours.  BNPNo results for input(s): BNP, PROBNP in the last 168 hours.  DDimer No results for input(s): DDIMER in the last 168 hours.   Radiology/Studies:  DG Chest Portable 1 View  Result Date: 03/11/2021 CLINICAL DATA:  Check endotracheal tube placement, unresponsive on way to ER, recent CPR performed EXAM: PORTABLE CHEST 1 VIEW COMPARISON:  None. FINDINGS: Cardiac shadow is within normal limits. Endotracheal tube and gastric catheter are noted. The proximal side port of the gastric catheter is in the distal esophagus. This should be advanced several cm. Diffuse bilateral airspace opacity is noted worst in the  right upper lobe likely related to underlying edema. No bony abnormality is seen. IMPRESSION: Diffuse airspace opacity bilaterally worse on the right likely related underlying edema. Endotracheal tube in satisfactory position. Gastric catheter shows proximal side port within the distal esophagus. This should be advanced several cm deeper into the stomach. Electronically Signed   By: Alcide Clever M.D.   On: 03/11/2021 23:03     Assessment and Plan:   VF/VT arrest: ischemic VT/VF given STEMI worsened by severe acidosis  Anterior STEMI - Lidocaine 1/min (s/p bolus) - amiodarone 1/min (s/p boluses) - s/p DES to  prox LAD - therapeutic temperature monitoring for 36 hours - aspirin 325 then 81 daily (ordered) - received heparin bolus - ticag 180 x1 followed by 90 bid for 1 year (ordered) - lipitor 80 (ordered) - formal TTE (ordered) - metoprolol tartrate 12.5 bid (ordered) - lasix 40 Iv x1 in lab, suspect intracardiac pressures due to afterload and intrapulmonary injury more then volume, will repeat tomorrow AM (ordered) - low-dose nitro gtt for afterload reduction (ordered) - trend troponins, EKG - K > 4, Mag > 2 - risk stratification labs - no evidence of cardiogenic shock  3. Acute Hypoxic Respiratory Failure - intubated on 100%/10 PEEP - management per critical care, ARDSNET - suspect portion of chemical pneumonitis, possible aspiration pneumonia, possible flail chest from compressions  4. Severe acidosis - combined respiratory from poor lung compliance and metabolic acidosis from lactic acidosis - bicarb gtt and boluses - increased RR to 28 - continue to trend ABGs/VBGs, permissive acidosis > 7.2 reasonable  5. Neurologic Status - given prolonged time down concern for anoxic brain injury - intubated for airway protection - TTM as above - CT head - neuroprognsotication after 36 hours  6. AKI on CKD: Cr 1.5 -> 1.8. Suspect ATN after time down. Monitor UOP with foley. No current  Hd indications  7. Shock Liver: likely ischemic from malperfusion. Continue to monitor  8. HTN: Has baseline uncontrolled HTN. Nitro gtt, metoprolol for now. Later will need ACEi/ARB    Risk Assessment/Risk Scores:     TIMI Risk Score for ST  Elevation MI:   The patient's TIMI risk score is 8, which indicates a 26.8% risk of all cause mortality at 30 days.{        For questions or updates, please contact [facility] Please consult [email or web address] for contact info under    Signed, [place] Sandford Diop, MD  03/12/2021 12:10 AM

## 2021-03-11 NOTE — ED Triage Notes (Signed)
Pt arrives via [facility] as Cpr in progress. Pt was c/o cp since 1630, wife was driving him to ER and pt went unresponsive, Cpr started on scene, EMS took over, initial rhythm v-fib. Total 6 epi's, 450 amiodarone and NS given. Pt has king airway in place on arrival

## 2021-03-11 NOTE — ED Notes (Signed)
Pulse check, carotid pulse palpated, CPR stopped. Levo drip started

## 2021-03-11 NOTE — ED Notes (Addendum)
Pt HR rhythm in vtach, 150J shock delivered

## 2021-03-11 NOTE — H&P (Addendum)
NAME:  Logan Brewer MRN:  [id number] DOB:  11/24/1950 LOS: 0 ADMISSION DATE:  03/11/2021 DATE OF SERVICE:  03/11/2021  CHIEF COMPLAINT:  cardiac arrest in the field   HISTORY & PHYSICAL  History of Present Illness  This 65 y.o. African American male presented to the [facility] Emergency Department via EMS CPR in progress. Per verbal report, the patient was being driven to the hospital by a family member after complaining of chest pain; however, the patient became unresponsive en route.  EMS was called from a parking lot.  Family reports patient was down for 10 min without complressions.  ACLS efforts included at least 6 amps of epinephrine, amiodarone boluses (totaling 450 mg) and IV fluids, in addition to shocks and chest compressions.  ROSC was briefly achieved just prior to arrival, but CPR was reinitiated and in progress on arrival.  Subsequently, the patient did have a change out of the [facility] airway placed in the field to a standard endotracheal tube, which did require administration of RSI medications, as the patient was reported to be moving all 4 extremities purposefully.  At the time of clinical interview, the patient is intubated and still under the effects of RSI medications.  Case discussed with Dr. Herbie Baltimore (Interventional Cardiology) at the bedside.  Plans to proceed immediately to cath lab for CODE STEMI noted.  Of note, the patient appeared to have recurrent VF/torsades arrest (at least 2 more bouts during clinical encounter) while in the ER despite amiodarone infusion.  Each bout was responsive to defibrillation x 1.  REVIEW OF SYSTEMS This patient is critically ill and cannot provide additional history nor review of systems due to mental status/unconsciousness and endotracheally intubated.   Past Medical/Surgical/Social/Family History   No past medical history on file.  Social History   Tobacco Use   Smoking status: Not on file   Smokeless tobacco: Not on  file  Substance Use Topics   Alcohol use: Not on file    No family history on file.   Procedures:  9/21: CPR and King airway placed in the field. Endotracheally intubated in ER. Shock x 2 in ER   Significant Diagnostic Tests:     Micro Data:  No results found for this or any previous visit.    Antimicrobials:      Interim history/subjective:     Objective   BP 123/84   Pulse (!) 103   Temp (!) 97.5 F (36.4 C) (Oral)   Resp (!) 22   SpO2 96%     There were no vitals filed for this visit.  Intake/Output Summary (Last 24 hours) at 03/11/2021 2356 Last data filed at 03/11/2021 2315 Gross per 24 hour  Intake 757.06 ml  Output --  Net 757.06 ml        Examination: GENERAL: Intubated. comatose or well-developed. No acute distress. HEAD: normocephalic, atraumatic EYE: PERRLA, EOM intact, no scleral icterus, no pallor. THROAT/ORAL CAVITY: Normal dentition. No oral thrush. No exudate. Mucous membranes are moist. No tonsillar enlargement. ETT in situ. NECK: supple, no thyromegaly, no JVD, no lymphadenopathy. Trachea midline. CHEST/LUNG: symmetric in development and expansion. Good air entry. Scattered crackles.  No wheezes. HEART: Regular S1 and S2 without murmur, rub or gallop. ABDOMEN: soft, nontender, nondistended. Normoactive bowel sounds. No rebound. No guarding. No hepatosplenomegaly. EXTREMITIES: Edema: Trace. No cyanosis. No clubbing. 2+ DP pulses LYMPHATIC: no cervical/axillary/inguinal lymph nodes appreciated MUSCULOSKELETAL: No point tenderness. No bulk atrophy. Joints: normal inspection.  SKIN:  No rash or lesion. NEUROLOGIC: still under the effects of RSI medications   Resolved Hospital Problem list      Assessment & Plan:   ASSESSMENT/PLAN:  ASSESSMENT (included in the Hospital Problem List)  Principal Problem:   Cardiac arrest [facility]) Active Problems:   STEMI (ST elevation myocardial infarction) (HCC)   Atrial fibrillation (HCC)   AKI  (acute kidney injury) (HCC)   Shock liver   Acute pulmonary edema (HCC)   Endotracheally intubated   Anoxic encephalopathy (HCC)   By systems: PULMONARY Endotracheally intubated Acute pulmonary edema Titrate vent settings based on ABG results Trach aspirate for Gram stain, C/S Titrate sedation for RASS -2 Propofol/fentanyl for sedation   CARDIOVASCULAR Cardiac arrest in the field Ventricular fibrillation/torsades de pointes STEMI Atrial fibrillation Cardiology input appreciated.  To cath lab now. Agree with starting TTM after cardiac catheterization.   RENAL Acute kidney injury Monitor urine output Avoid nephrotoxic drugs Renal dosing of medications   GASTROINTESTINAL Abnormal LFTs, likely shock liver GI PROPHYLAXIS: Protonix   HEMATOLOGIC DVT PROPHYLAXIS: heparin   INFECTIOUS: No acute issues   ENDOCRINE Hyperglycemia Check HgbA1c   NEUROLOGIC Anoxic encephalopathy Monitor neurologic status Titrate sedation for RASS -2 for now   PLAN/RECOMMENDATIONS  Admit to ICU under my service (Attending: Marcelle Smiling, MD) with the diagnoses highlighted above in the active Hospital Problem List (ASSESSMENT).     My assessment, plan of care, findings, medications, side effects, etc. were discussed with: nurse and Dr. Herbie Baltimore (Interventional Cardiology).   Best practice:  Diet: tube feeds Pain/Anxiety/Delirium protocol (if indicated): YES VAP protocol (if indicated): YES DVT prophylaxis: heparin GI prophylaxis: Protonix Glucose control: check HgbA1c Mobility/Activity: bedrest   Code Status: Not on file Family Communication:   no family at bedside Disposition: admit to ICU after cath lab   Labs   CBC: Recent Labs  Lab 03/11/21 2245 03/11/21 2300  WBC 9.6  --   NEUTROABS PENDING  --   HGB 13.9 15.0  14.6  HCT 44.7 44.0  43.0  MCV 104.7*  --   PLT 168  --     Basic Metabolic Panel: Recent Labs  Lab 03/11/21 2300  NA 143  143  K 3.6  3.6   CL 106  GLUCOSE 270*  BUN 18  CREATININE 1.50*   GFR: CrCl cannot be calculated (Unknown ideal weight.). Recent Labs  Lab 03/11/21 2245  WBC 9.6    Liver Function Tests: No results for input(s): AST, ALT, ALKPHOS, BILITOT, PROT, ALBUMIN in the last 168 hours. No results for input(s): LIPASE, AMYLASE in the last 168 hours. No results for input(s): AMMONIA in the last 168 hours.  ABG    Component Value Date/Time   HCO3 16.0 (L) 03/11/2021 2300   TCO2 19 (L) 03/11/2021 2300   TCO2 18 (L) 03/11/2021 2300   ACIDBASEDEF 18.0 (H) 03/11/2021 2300   O2SAT 56.0 03/11/2021 2300     Coagulation Profile: Recent Labs  Lab 03/11/21 2245  INR 1.5*    Cardiac Enzymes: No results for input(s): CKTOTAL, CKMB, CKMBINDEX, TROPONINI in the last 168 hours.  HbA1C: No results found for: HGBA1C  CBG: No results for input(s): GLUCAP in the last 168 hours.   Past Medical History  No past medical history on file.    Surgical History   History reviewed. No pertinent surgical history.    Social History   Social History   Socioeconomic History   Marital status: Single    Spouse name: Not on file  Number of children: Not on file   Years of education: Not on file   Highest education level: Not on file  Occupational History   Not on file  Tobacco Use   Smoking status: Not on file   Smokeless tobacco: Not on file  Substance and Sexual Activity   Alcohol use: Not on file   Drug use: Not on file   Sexual activity: Not on file  Other Topics Concern   Not on file  Social History Narrative   Not on file   Social Determinants of Health   Financial Resource Strain: Not on file  Food Insecurity: Not on file  Transportation Needs: Not on file  Physical Activity: Not on file  Stress: Not on file  Social Connections: Not on file      Family History   No family history on file. family history is not on file.    Allergies Not on File    Current  Medications  Current Facility-Administered Medications:    0.9 %  sodium chloride infusion, 250 mL, Intravenous, Continuous, Hong, Eustace Moore, MD   amiodarone (NEXTERONE PREMIX) 360-4.14 MG/200ML-% (1.8 mg/mL) IV infusion, 60 mg/hr, Intravenous, Continuous, Hong, Eustace Moore, MD, Last Rate: 33.3 mL/hr at 03/11/21 2304, 60 mg/hr at 03/11/21 2304   [START ON 03/12/2021] amiodarone (NEXTERONE PREMIX) 360-4.14 MG/200ML-% (1.8 mg/mL) IV infusion, 30 mg/hr, Intravenous, Continuous, Hong, Eustace Moore, MD   fentaNYL (SUBLIMAZE) 50 MCG/ML injection, , , ,    [MAR Hold] fentaNYL (SUBLIMAZE) bolus via infusion 25 mcg, 25 mcg, Intravenous, Q1H PRN, Hong, Joshua S, MD   fentaNYL in NS (80mcg/ml) infusion-PREMIX, 25-400 mcg/hr, Intravenous, Continuous, Hong, Joshua S, MD, Last Rate: 10 mL/hr at 03/11/21 2300, 100 mcg/hr at 03/11/21 2300   lidocaine (cardiac) 2000 mg in dextrose 5% 500 mL (4mg /mL) IV infusion, 1 mg/min, Intravenous, Continuous, Hong, Joshua S, MD, Last Rate: 15 mL/hr at 03/11/21 2339, 1 mg/min at 03/11/21 2339   norepinephrine (LEVOPHED) 4mg  in 2340 premix infusion, 0-10 mcg/min, Intravenous, Continuous, , [facility], Last Rate: 3.75 mL/hr at 03/11/21 2315, 1 mcg/min at 03/11/21 2315   sodium bicarbonate 150 mEq in sterile water 1,150 mL infusion, , Intravenous, Continuous, Hong, 03/13/21, MD   Home Medications  Prior to Admission medications   Not on File      Critical care time: 45 minutes.  The treatment and management of the patient's condition was required based on the threat of imminent deterioration. This time reflects time spent by the physician evaluating, providing care and managing the critically ill patient's care. The time was spent at the immediate bedside (or on the same floor/unit and dedicated to this patient's care). Time involved in separately billable procedures is NOT included int he critical care time indicated above. Family meeting and update time may be  included above if and only if the patient is unable/incompetent to participate in clinical interview and/or decision making, and the discussion was necessary to determining treatment decisions.   08-23-1969, MD Board Certified by the ABIM, Pulmonary Diseases & Critical Care Medicine

## 2021-03-11 NOTE — ED Notes (Signed)
OG tube advanced per xray

## 2021-03-11 NOTE — ED Notes (Addendum)
Per family, pt was down 10 min without compressions.

## 2021-03-11 NOTE — ED Notes (Signed)
Family updated as to patient's status.

## 2021-03-11 NOTE — ED Notes (Addendum)
Pt HR rhythm in vtach, cardiology and critical care at bedside, 120J shock delivered

## 2021-03-11 NOTE — ED Notes (Signed)
Family at bedside. 

## 2021-03-12 ENCOUNTER — Inpatient Hospital Stay (HOSPITAL_COMMUNITY): Payer: Self-pay

## 2021-03-12 ENCOUNTER — Encounter (HOSPITAL_COMMUNITY): Payer: Self-pay | Admitting: Pulmonary Disease

## 2021-03-12 DIAGNOSIS — E872 Acidosis, unspecified: Secondary | ICD-10-CM

## 2021-03-12 DIAGNOSIS — I2109 ST elevation (STEMI) myocardial infarction involving other coronary artery of anterior wall: Secondary | ICD-10-CM

## 2021-03-12 DIAGNOSIS — N179 Acute kidney failure, unspecified: Secondary | ICD-10-CM | POA: Diagnosis present

## 2021-03-12 DIAGNOSIS — G931 Anoxic brain damage, not elsewhere classified: Secondary | ICD-10-CM | POA: Diagnosis present

## 2021-03-12 DIAGNOSIS — I25110 Atherosclerotic heart disease of native coronary artery with unstable angina pectoris: Secondary | ICD-10-CM

## 2021-03-12 DIAGNOSIS — I25119 Atherosclerotic heart disease of native coronary artery with unspecified angina pectoris: Secondary | ICD-10-CM

## 2021-03-12 DIAGNOSIS — I469 Cardiac arrest, cause unspecified: Secondary | ICD-10-CM | POA: Diagnosis present

## 2021-03-12 DIAGNOSIS — Z978 Presence of other specified devices: Secondary | ICD-10-CM | POA: Diagnosis present

## 2021-03-12 DIAGNOSIS — I213 ST elevation (STEMI) myocardial infarction of unspecified site: Secondary | ICD-10-CM | POA: Diagnosis present

## 2021-03-12 DIAGNOSIS — I2510 Atherosclerotic heart disease of native coronary artery without angina pectoris: Secondary | ICD-10-CM

## 2021-03-12 DIAGNOSIS — I4891 Unspecified atrial fibrillation: Secondary | ICD-10-CM | POA: Diagnosis present

## 2021-03-12 DIAGNOSIS — K7200 Acute and subacute hepatic failure without coma: Secondary | ICD-10-CM | POA: Diagnosis present

## 2021-03-12 DIAGNOSIS — I2102 ST elevation (STEMI) myocardial infarction involving left anterior descending coronary artery: Secondary | ICD-10-CM

## 2021-03-12 DIAGNOSIS — J810 Acute pulmonary edema: Secondary | ICD-10-CM | POA: Diagnosis present

## 2021-03-12 HISTORY — DX: ST elevation (STEMI) myocardial infarction involving left anterior descending coronary artery: I21.02

## 2021-03-12 LAB — URINALYSIS, MICROSCOPIC (REFLEX)

## 2021-03-12 LAB — RESP PANEL BY RT-PCR (FLU A&B, COVID) ARPGX2
Influenza A by PCR: NEGATIVE
Influenza B by PCR: NEGATIVE
SARS Coronavirus 2 by RT PCR: NEGATIVE

## 2021-03-12 LAB — CBC
HCT: 45.1 % (ref 39.0–52.0)
HCT: 43.2 % (ref 39.0–52.0)
HCT: 42.5 % (ref 39.0–52.0)
Hemoglobin: 15.7 g/dL (ref 13.0–17.0)
Hemoglobin: 14.4 g/dL (ref 13.0–17.0)
Hemoglobin: 14.7 g/dL (ref 13.0–17.0)
MCH: 32.9 pg (ref 26.0–34.0)
MCH: 32.1 pg (ref 26.0–34.0)
MCH: 32.5 pg (ref 26.0–34.0)
MCHC: 34.8 g/dL (ref 30.0–36.0)
MCHC: 33.3 g/dL (ref 30.0–36.0)
MCHC: 34.6 g/dL (ref 30.0–36.0)
MCV: 94.5 fL (ref 80.0–100.0)
MCV: 96.2 fL (ref 80.0–100.0)
MCV: 94 fL (ref 80.0–100.0)
Platelets: 269 10*3/uL (ref 150–400)
Platelets: 260 10*3/uL (ref 150–400)
Platelets: 235 10*3/uL (ref 150–400)
RBC: 4.77 MIL/uL (ref 4.22–5.81)
RBC: 4.49 MIL/uL (ref 4.22–5.81)
RBC: 4.52 MIL/uL (ref 4.22–5.81)
RDW: 13.2 % (ref 11.5–15.5)
RDW: 13.4 % (ref 11.5–15.5)
RDW: 13.5 % (ref 11.5–15.5)
WBC: 23 10*3/uL — ABNORMAL HIGH (ref 4.0–10.5)
WBC: 25.3 10*3/uL — ABNORMAL HIGH (ref 4.0–10.5)
WBC: 27.8 10*3/uL — ABNORMAL HIGH (ref 4.0–10.5)
nRBC: 0.1 % (ref 0.0–0.2)
nRBC: 0 % (ref 0.0–0.2)
nRBC: 0 % (ref 0.0–0.2)

## 2021-03-12 LAB — POCT I-STAT 7, (LYTES, BLD GAS, ICA,H+H)
Acid-base deficit: 4 mmol/L — ABNORMAL HIGH (ref 0.0–2.0)
Acid-base deficit: 7 mmol/L — ABNORMAL HIGH (ref 0.0–2.0)
Acid-base deficit: 16 mmol/L — ABNORMAL HIGH (ref 0.0–2.0)
Bicarbonate: 21.7 mmol/L (ref 20.0–28.0)
Bicarbonate: 23.4 mmol/L (ref 20.0–28.0)
Bicarbonate: 16 mmol/L — ABNORMAL LOW (ref 20.0–28.0)
Calcium, Ion: 0.96 mmol/L — ABNORMAL LOW (ref 1.15–1.40)
Calcium, Ion: 0.99 mmol/L — ABNORMAL LOW (ref 1.15–1.40)
Calcium, Ion: 1.08 mmol/L — ABNORMAL LOW (ref 1.15–1.40)
HCT: 42 % (ref 39.0–52.0)
HCT: 43 % (ref 39.0–52.0)
HCT: 45 % (ref 39.0–52.0)
Hemoglobin: 14.3 g/dL (ref 13.0–17.0)
Hemoglobin: 14.6 g/dL (ref 13.0–17.0)
Hemoglobin: 15.3 g/dL (ref 13.0–17.0)
O2 Saturation: 98 %
O2 Saturation: 98 %
O2 Saturation: 89 %
Patient temperature: 98.6
Potassium: 3.2 mmol/L — ABNORMAL LOW (ref 3.5–5.1)
Potassium: 3.3 mmol/L — ABNORMAL LOW (ref 3.5–5.1)
Potassium: 3.5 mmol/L (ref 3.5–5.1)
Sodium: 141 mmol/L (ref 135–145)
Sodium: 143 mmol/L (ref 135–145)
Sodium: 140 mmol/L (ref 135–145)
TCO2: 23 mmol/L (ref 22–32)
TCO2: 25 mmol/L (ref 22–32)
TCO2: 18 mmol/L — ABNORMAL LOW (ref 22–32)
pCO2 arterial: 50.2 mmHg — ABNORMAL HIGH (ref 32.0–48.0)
pCO2 arterial: 56.2 mmHg — ABNORMAL HIGH (ref 32.0–48.0)
pCO2 arterial: 65.9 mmHg (ref 32.0–48.0)
pH, Arterial: 7.196 — CL (ref 7.350–7.450)
pH, Arterial: 7.277 — ABNORMAL LOW (ref 7.350–7.450)
pH, Arterial: 6.992 — CL (ref 7.350–7.450)
pO2, Arterial: 113 mmHg — ABNORMAL HIGH (ref 83.0–108.0)
pO2, Arterial: 129 mmHg — ABNORMAL HIGH (ref 83.0–108.0)
pO2, Arterial: 87 mmHg (ref 83.0–108.0)

## 2021-03-12 LAB — BASIC METABOLIC PANEL
Anion gap: 19 — ABNORMAL HIGH (ref 5–15)
Anion gap: 17 — ABNORMAL HIGH (ref 5–15)
Anion gap: 10 (ref 5–15)
BUN: 19 mg/dL (ref 8–23)
BUN: 22 mg/dL (ref 8–23)
BUN: 21 mg/dL (ref 8–23)
CO2: 18 mmol/L — ABNORMAL LOW (ref 22–32)
CO2: 18 mmol/L — ABNORMAL LOW (ref 22–32)
CO2: 25 mmol/L (ref 22–32)
Calcium: 7.3 mg/dL — ABNORMAL LOW (ref 8.9–10.3)
Calcium: 7.4 mg/dL — ABNORMAL LOW (ref 8.9–10.3)
Calcium: 7.8 mg/dL — ABNORMAL LOW (ref 8.9–10.3)
Chloride: 99 mmol/L (ref 98–111)
Chloride: 101 mmol/L (ref 98–111)
Chloride: 102 mmol/L (ref 98–111)
Creatinine, Ser: 2.2 mg/dL — ABNORMAL HIGH (ref 0.61–1.24)
Creatinine, Ser: 2.68 mg/dL — ABNORMAL HIGH (ref 0.61–1.24)
Creatinine, Ser: 2.1 mg/dL — ABNORMAL HIGH (ref 0.61–1.24)
GFR, Estimated: 32 mL/min — ABNORMAL LOW (ref >60)
GFR, Estimated: 25 mL/min — ABNORMAL LOW (ref >60)
GFR, Estimated: 35 mL/min — ABNORMAL LOW (ref >60)
Glucose, Bld: 315 mg/dL — ABNORMAL HIGH (ref 70–99)
Glucose, Bld: 222 mg/dL — ABNORMAL HIGH (ref 70–99)
Glucose, Bld: 110 mg/dL — ABNORMAL HIGH (ref 70–99)
Potassium: 3.4 mmol/L — ABNORMAL LOW (ref 3.5–5.1)
Potassium: 3.2 mmol/L — ABNORMAL LOW (ref 3.5–5.1)
Potassium: 3.6 mmol/L (ref 3.5–5.1)
Sodium: 136 mmol/L (ref 135–145)
Sodium: 136 mmol/L (ref 135–145)
Sodium: 137 mmol/L (ref 135–145)

## 2021-03-12 LAB — TROPONIN I (HIGH SENSITIVITY)
Troponin I (High Sensitivity): 178 ng/L (ref <18)
Troponin I (High Sensitivity): >24000 ng/L (ref <18)
Troponin I (High Sensitivity): >24000 ng/L (ref <18)

## 2021-03-12 LAB — COOXEMETRY PANEL
Carboxyhemoglobin: 0.2 % — ABNORMAL LOW (ref 0.5–1.5)
Carboxyhemoglobin: 0.6 % (ref 0.5–1.5)
Carboxyhemoglobin: 0 % — ABNORMAL LOW (ref 0.5–1.5)
Methemoglobin: 1.4 % (ref 0.0–1.5)
Methemoglobin: 1.3 % (ref 0.0–1.5)
Methemoglobin: 0.7 % (ref 0.0–1.5)
O2 Saturation: 98.9 %
O2 Saturation: 54.4 %
O2 Saturation: 67.9 %
Total hemoglobin: 15.1 g/dL (ref 12.0–16.0)
Total hemoglobin: 15.1 g/dL (ref 12.0–16.0)
Total hemoglobin: 15.3 g/dL (ref 12.0–16.0)

## 2021-03-12 LAB — ECHOCARDIOGRAM COMPLETE
Area-P 1/2: 7.29 cm2
Calc EF: 27.9 %
Height: 69 in
S' Lateral: 3.5 cm
Single Plane A2C EF: 17.2 %
Single Plane A4C EF: 35.4 %
Weight: 3100.55 oz

## 2021-03-12 LAB — POCT I-STAT EG7
Acid-base deficit: 4 mmol/L — ABNORMAL HIGH (ref 0.0–2.0)
Bicarbonate: 23.6 mmol/L (ref 20.0–28.0)
Calcium, Ion: 0.88 mmol/L — CL (ref 1.15–1.40)
HCT: 41 % (ref 39.0–52.0)
Hemoglobin: 13.9 g/dL (ref 13.0–17.0)
O2 Saturation: 76 %
Potassium: 2.9 mmol/L — ABNORMAL LOW (ref 3.5–5.1)
Sodium: 144 mmol/L (ref 135–145)
TCO2: 25 mmol/L (ref 22–32)
pCO2, Ven: 53.2 mmHg (ref 44.0–60.0)
pH, Ven: 7.255 (ref 7.250–7.430)
pO2, Ven: 48 mmHg — ABNORMAL HIGH (ref 32.0–45.0)

## 2021-03-12 LAB — COMPREHENSIVE METABOLIC PANEL
ALT: 418 U/L — ABNORMAL HIGH (ref 0–44)
AST: 481 U/L — ABNORMAL HIGH (ref 15–41)
Albumin: 2.6 g/dL — ABNORMAL LOW (ref 3.5–5.0)
Alkaline Phosphatase: 56 U/L (ref 38–126)
Anion gap: 20 — ABNORMAL HIGH (ref 5–15)
BUN: 15 mg/dL (ref 8–23)
CO2: 13 mmol/L — ABNORMAL LOW (ref 22–32)
Calcium: 7.9 mg/dL — ABNORMAL LOW (ref 8.9–10.3)
Chloride: 107 mmol/L (ref 98–111)
Creatinine, Ser: 1.79 mg/dL — ABNORMAL HIGH (ref 0.61–1.24)
GFR, Estimated: 41 mL/min — ABNORMAL LOW (ref >60)
Glucose, Bld: 284 mg/dL — ABNORMAL HIGH (ref 70–99)
Potassium: 3.6 mmol/L (ref 3.5–5.1)
Sodium: 140 mmol/L (ref 135–145)
Total Bilirubin: 0.8 mg/dL (ref 0.3–1.2)
Total Protein: 5.2 g/dL — ABNORMAL LOW (ref 6.5–8.1)

## 2021-03-12 LAB — CBC WITH DIFFERENTIAL/PLATELET
Abs Immature Granulocytes: 0.26 10*3/uL — ABNORMAL HIGH (ref 0.00–0.07)
Basophils Absolute: 0 10*3/uL (ref 0.0–0.1)
Basophils Relative: 0 %
Eosinophils Absolute: 0.1 10*3/uL (ref 0.0–0.5)
Eosinophils Relative: 1 %
HCT: 44.7 % (ref 39.0–52.0)
Hemoglobin: 13.9 g/dL (ref 13.0–17.0)
Immature Granulocytes: 3 %
Lymphocytes Relative: 57 %
Lymphs Abs: 5.6 10*3/uL — ABNORMAL HIGH (ref 0.7–4.0)
MCH: 32.6 pg (ref 26.0–34.0)
MCHC: 31.1 g/dL (ref 30.0–36.0)
MCV: 104.7 fL — ABNORMAL HIGH (ref 80.0–100.0)
Monocytes Absolute: 0.3 10*3/uL (ref 0.1–1.0)
Monocytes Relative: 3 %
Neutro Abs: 3.4 10*3/uL (ref 1.7–7.7)
Neutrophils Relative %: 36 %
Platelets: 168 10*3/uL (ref 150–400)
RBC: 4.27 MIL/uL (ref 4.22–5.81)
RDW: 13.3 % (ref 11.5–15.5)
WBC: 9.6 10*3/uL (ref 4.0–10.5)
nRBC: 0.3 % — ABNORMAL HIGH (ref 0.0–0.2)

## 2021-03-12 LAB — HEMOGLOBIN A1C
Hgb A1c MFr Bld: 6.4 % — ABNORMAL HIGH (ref 4.8–5.6)
Mean Plasma Glucose: 137 mg/dL

## 2021-03-12 LAB — LIPID PANEL
Cholesterol: 154 mg/dL (ref 0–200)
HDL: 37 mg/dL — ABNORMAL LOW (ref >40)
LDL Cholesterol: 103 mg/dL — ABNORMAL HIGH (ref 0–99)
Total CHOL/HDL Ratio: 4.2 RATIO
Triglycerides: 70 mg/dL (ref <150)
VLDL: 14 mg/dL (ref 0–40)

## 2021-03-12 LAB — MAGNESIUM
Magnesium: 2.7 mg/dL — ABNORMAL HIGH (ref 1.7–2.4)
Magnesium: 3.2 mg/dL — ABNORMAL HIGH (ref 1.7–2.4)

## 2021-03-12 LAB — URINALYSIS, ROUTINE W REFLEX MICROSCOPIC
Bilirubin Urine: NEGATIVE
Glucose, UA: 100 mg/dL — AB
Ketones, ur: NEGATIVE mg/dL
Leukocytes,Ua: NEGATIVE
Nitrite: NEGATIVE
Protein, ur: NEGATIVE mg/dL
Specific Gravity, Urine: 1.01 (ref 1.005–1.030)
pH: 6 (ref 5.0–8.0)

## 2021-03-12 LAB — POCT ACTIVATED CLOTTING TIME: Activated Clotting Time: 318 seconds

## 2021-03-12 LAB — GLUCOSE, CAPILLARY
Glucose-Capillary: 211 mg/dL — ABNORMAL HIGH (ref 70–99)
Glucose-Capillary: 115 mg/dL — ABNORMAL HIGH (ref 70–99)
Glucose-Capillary: 98 mg/dL (ref 70–99)

## 2021-03-12 LAB — MRSA NEXT GEN BY PCR, NASAL: MRSA by PCR Next Gen: NOT DETECTED

## 2021-03-12 LAB — CREATININE, SERUM
Creatinine, Ser: 1.79 mg/dL — ABNORMAL HIGH (ref 0.61–1.24)
GFR, Estimated: 41 mL/min — ABNORMAL LOW (ref >60)

## 2021-03-12 LAB — LACTIC ACID, PLASMA
Lactic Acid, Venous: >11 mmol/L (ref 0.5–1.9)
Lactic Acid, Venous: 8.8 mmol/L (ref 0.5–1.9)
Lactic Acid, Venous: >11 mmol/L (ref 0.5–1.9)

## 2021-03-12 LAB — HIV ANTIBODY (ROUTINE TESTING W REFLEX): HIV Screen 4th Generation wRfx: NONREACTIVE

## 2021-03-12 MED ORDER — METOPROLOL TARTRATE 12.5 MG HALF TABLET: 12.5000 mg | ORAL_TABLET | Freq: Two times a day (BID) | ORAL | Status: DC

## 2021-03-12 MED ORDER — NITROGLYCERIN IN D5W 200-5 MCG/ML-% IV SOLN: 2.0000 ug/min | INTRAVENOUS | Status: DC

## 2021-03-12 MED ORDER — TICAGRELOR 90 MG PO TABS: 90.0000 mg | ORAL_TABLET | Freq: Two times a day (BID) | ORAL | Status: DC

## 2021-03-12 MED ORDER — ATORVASTATIN CALCIUM 80 MG PO TABS
80.0000 mg | ORAL_TABLET | Freq: Every day | ORAL | Status: DC
  Administered 2021-03-12 – 2021-03-15 (×4): 80 mg
  Filled 2021-03-12 (×4): qty 1

## 2021-03-12 MED ORDER — SPIRONOLACTONE 12.5 MG HALF TABLET
12.5000 mg | ORAL_TABLET | Freq: Every day | ORAL | Status: DC
  Administered 2021-03-13 – 2021-03-15 (×3): 12.5 mg via ORAL
  Filled 2021-03-12 (×3): qty 1

## 2021-03-12 MED ORDER — FUROSEMIDE 10 MG/ML IJ SOLN
40.0000 mg | Freq: Once | INTRAMUSCULAR | Status: AC
  Administered 2021-03-12: 40 mg via INTRAVENOUS
  Filled 2021-03-12: qty 4

## 2021-03-12 MED ORDER — METOPROLOL TARTRATE 5 MG/5ML IV SOLN
INTRAVENOUS | Status: AC
  Filled 2021-03-12: qty 5

## 2021-03-12 MED ORDER — FENTANYL 2500MCG IN NS 250ML (10MCG/ML) PREMIX INFUSION
25.0000 ug/h | INTRAVENOUS | Status: DC
  Administered 2021-03-12: 75 ug/h via INTRAVENOUS

## 2021-03-12 MED ORDER — SODIUM CHLORIDE 0.9% FLUSH
3.0000 mL | INTRAVENOUS | Status: DC | PRN
  Administered 2021-03-12: 3 mL via INTRAVENOUS

## 2021-03-12 MED ORDER — ORAL CARE MOUTH RINSE
15.0000 mL | Freq: Two times a day (BID) | OROMUCOSAL | Status: DC
  Administered 2021-03-13 – 2021-03-15 (×4): 15 mL via OROMUCOSAL

## 2021-03-12 MED ORDER — FENTANYL BOLUS VIA INFUSION
25.0000 ug | INTRAVENOUS | Status: DC | PRN
  Administered 2021-03-12 (×2): 50 ug via INTRAVENOUS
  Filled 2021-03-12: qty 100

## 2021-03-12 MED ORDER — SODIUM CHLORIDE 0.9 % IV SOLN: INTRAVENOUS | Status: DC

## 2021-03-12 MED ORDER — POLYETHYLENE GLYCOL 3350 17 G PO PACK: 17.0000 g | PACK | Freq: Every day | ORAL | Status: DC | PRN

## 2021-03-12 MED ORDER — POTASSIUM CHLORIDE 10 MEQ/100ML IV SOLN
10.0000 meq | INTRAVENOUS | Status: AC
Start: 2021-03-12 — End: 2021-03-12
  Administered 2021-03-12 (×4): 10 meq via INTRAVENOUS
  Filled 2021-03-12 (×3): qty 100

## 2021-03-12 MED ORDER — IOHEXOL 350 MG/ML SOLN
INTRAVENOUS | Status: DC | PRN
  Administered 2021-03-12: 160 mL via INTRA_ARTERIAL

## 2021-03-12 MED ORDER — HEPARIN SODIUM (PORCINE) 5000 UNIT/ML IJ SOLN
5000.0000 [IU] | Freq: Three times a day (TID) | INTRAMUSCULAR | Status: DC
  Administered 2021-03-12 – 2021-03-14 (×5): 5000 [IU] via SUBCUTANEOUS
  Filled 2021-03-12 (×4): qty 1

## 2021-03-12 MED ORDER — MAGNESIUM SULFATE 2 GM/50ML IV SOLN
2.0000 g | Freq: Once | INTRAVENOUS | Status: AC
  Administered 2021-03-12: 2 g via INTRAVENOUS
  Filled 2021-03-12: qty 50

## 2021-03-12 MED ORDER — METOCLOPRAMIDE HCL 5 MG/ML IJ SOLN
10.0000 mg | Freq: Three times a day (TID) | INTRAMUSCULAR | Status: DC
  Administered 2021-03-12 – 2021-03-14 (×5): 10 mg via INTRAVENOUS
  Filled 2021-03-12 (×5): qty 2

## 2021-03-12 MED ORDER — ALBUTEROL SULFATE (2.5 MG/3ML) 0.083% IN NEBU: 2.5000 mg | INHALATION_SOLUTION | Freq: Four times a day (QID) | RESPIRATORY_TRACT | Status: DC | PRN

## 2021-03-12 MED ORDER — FUROSEMIDE 10 MG/ML IJ SOLN
INTRAMUSCULAR | Status: AC
  Filled 2021-03-12: qty 4

## 2021-03-12 MED ORDER — NITROGLYCERIN 1 MG/10 ML FOR IR/CATH LAB
INTRA_ARTERIAL | Status: DC | PRN
  Administered 2021-03-12: 200 ug via INTRACORONARY

## 2021-03-12 MED ORDER — TICAGRELOR 90 MG PO TABS
ORAL_TABLET | ORAL | Status: AC
  Filled 2021-03-12: qty 1

## 2021-03-12 MED ORDER — SODIUM CHLORIDE 0.9 % IV SOLN
250.0000 mL | INTRAVENOUS | Status: DC | PRN
  Administered 2021-03-12: 250 mL via INTRAVENOUS

## 2021-03-12 MED ORDER — CHLORHEXIDINE GLUCONATE 0.12% ORAL RINSE (MEDLINE KIT)
15.0000 mL | Freq: Two times a day (BID) | OROMUCOSAL | Status: DC
  Administered 2021-03-12: 15 mL via OROMUCOSAL

## 2021-03-12 MED ORDER — TICAGRELOR 90 MG PO TABS
90.0000 mg | ORAL_TABLET | Freq: Two times a day (BID) | ORAL | Status: DC
  Administered 2021-03-12 – 2021-03-15 (×7): 90 mg
  Filled 2021-03-12 (×8): qty 1

## 2021-03-12 MED ORDER — LIDOCAINE HCL (PF) 1 % IJ SOLN
INTRAMUSCULAR | Status: DC | PRN
  Administered 2021-03-12: 15 mL via SUBCUTANEOUS

## 2021-03-12 MED ORDER — CHLORHEXIDINE GLUCONATE CLOTH 2 % EX PADS
6.0000 | MEDICATED_PAD | Freq: Every day | CUTANEOUS | Status: DC
  Administered 2021-03-13 – 2021-03-14 (×3): 6 via TOPICAL

## 2021-03-12 MED ORDER — HYDRALAZINE HCL 20 MG/ML IJ SOLN
10.0000 mg | INTRAMUSCULAR | Status: AC | PRN
  Filled 2021-03-12: qty 1

## 2021-03-12 MED ORDER — MILRINONE LACTATE IN DEXTROSE 20-5 MG/100ML-% IV SOLN
0.1250 ug/kg/min | INTRAVENOUS | Status: DC
  Administered 2021-03-12 – 2021-03-13 (×2): 0.125 ug/kg/min via INTRAVENOUS
  Filled 2021-03-12 (×2): qty 100

## 2021-03-12 MED ORDER — SODIUM CHLORIDE 0.9% IV SOLUTION: INTRAVENOUS | Status: DC

## 2021-03-12 MED ORDER — POLYETHYLENE GLYCOL 3350 17 G PO PACK
17.0000 g | PACK | Freq: Every day | ORAL | Status: DC
  Administered 2021-03-12 – 2021-03-13 (×2): 17 g
  Filled 2021-03-12 (×2): qty 1

## 2021-03-12 MED ORDER — CHLORHEXIDINE GLUCONATE CLOTH 2 % EX PADS
6.0000 | MEDICATED_PAD | Freq: Every day | CUTANEOUS | Status: DC
  Administered 2021-03-12 – 2021-03-19 (×6): 6 via TOPICAL

## 2021-03-12 MED ORDER — SODIUM CHLORIDE 0.9 % IV SOLN
INTRAVENOUS | Status: DC
  Administered 2021-03-14: 10 mL/h via INTRAVENOUS

## 2021-03-12 MED ORDER — DOCUSATE SODIUM 50 MG/5ML PO LIQD
100.0000 mg | Freq: Two times a day (BID) | ORAL | Status: DC | PRN
  Filled 2021-03-12: qty 10

## 2021-03-12 MED ORDER — LABETALOL HCL 5 MG/ML IV SOLN
10.0000 mg | INTRAVENOUS | Status: AC | PRN
  Filled 2021-03-12: qty 4

## 2021-03-12 MED ORDER — ONDANSETRON HCL 4 MG/2ML IJ SOLN
4.0000 mg | Freq: Four times a day (QID) | INTRAMUSCULAR | Status: DC | PRN
  Administered 2021-03-12: 4 mg via INTRAVENOUS
  Filled 2021-03-12: qty 2

## 2021-03-12 MED ORDER — DIGOXIN 125 MCG PO TABS
0.1250 mg | ORAL_TABLET | Freq: Every day | ORAL | Status: DC
  Administered 2021-03-13 – 2021-03-16 (×4): 0.125 mg via ORAL
  Filled 2021-03-12 (×5): qty 1

## 2021-03-12 MED ORDER — SODIUM CHLORIDE 0.9% FLUSH
10.0000 mL | Freq: Two times a day (BID) | INTRAVENOUS | Status: DC
  Administered 2021-03-12 – 2021-03-15 (×7): 10 mL
  Administered 2021-03-15: 40 mL
  Administered 2021-03-16 – 2021-03-19 (×6): 10 mL

## 2021-03-12 MED ORDER — FUROSEMIDE 10 MG/ML IJ SOLN
INTRAMUSCULAR | Status: DC | PRN
  Administered 2021-03-12: 40 mg via INTRAVENOUS

## 2021-03-12 MED ORDER — ORAL CARE MOUTH RINSE
15.0000 mL | OROMUCOSAL | Status: DC
  Administered 2021-03-12 (×4): 15 mL via OROMUCOSAL

## 2021-03-12 MED ORDER — CHLORHEXIDINE GLUCONATE 0.12 % MT SOLN
15.0000 mL | Freq: Two times a day (BID) | OROMUCOSAL | Status: DC
  Administered 2021-03-12 – 2021-03-15 (×5): 15 mL via OROMUCOSAL
  Filled 2021-03-12 (×3): qty 15

## 2021-03-12 MED ORDER — FENTANYL CITRATE PF 50 MCG/ML IJ SOSY
12.5000 ug | PREFILLED_SYRINGE | INTRAMUSCULAR | Status: DC | PRN
Start: 2021-03-12 — End: 2021-03-13
  Administered 2021-03-12 – 2021-03-13 (×5): 12.5 ug via INTRAVENOUS
  Filled 2021-03-12 (×5): qty 1

## 2021-03-12 MED ORDER — SODIUM CHLORIDE 0.9% IV SOLUTION: INTRAVENOUS | Status: DC | PRN

## 2021-03-12 MED ORDER — PANTOPRAZOLE SODIUM 40 MG IV SOLR
40.0000 mg | Freq: Every day | INTRAVENOUS | Status: DC
  Administered 2021-03-12 – 2021-03-13 (×3): 40 mg via INTRAVENOUS
  Filled 2021-03-12 (×3): qty 40

## 2021-03-12 MED ORDER — METOPROLOL TARTRATE 5 MG/5ML IV SOLN
INTRAVENOUS | Status: DC | PRN
  Administered 2021-03-12: 5 mg via INTRAVENOUS

## 2021-03-12 MED ORDER — ACETAMINOPHEN 325 MG PO TABS
650.0000 mg | ORAL_TABLET | ORAL | Status: DC | PRN
  Administered 2021-03-12: 650 mg
  Filled 2021-03-12 (×2): qty 2

## 2021-03-12 MED ORDER — SODIUM BICARBONATE 8.4 % IV SOLN
INTRAVENOUS | Status: DC | PRN
  Administered 2021-03-12 (×2): 50 meq via INTRAVENOUS

## 2021-03-12 MED ORDER — SPIRONOLACTONE 12.5 MG HALF TABLET: 12.5000 mg | ORAL_TABLET | Freq: Every day | ORAL | Status: DC

## 2021-03-12 MED ORDER — HEPARIN SODIUM (PORCINE) 1000 UNIT/ML IJ SOLN
INTRAMUSCULAR | Status: DC | PRN
  Administered 2021-03-12: 5000 [IU] via INTRAVENOUS

## 2021-03-12 MED ORDER — PROPOFOL 1000 MG/100ML IV EMUL
0.0000 ug/kg/min | INTRAVENOUS | Status: DC
  Filled 2021-03-12: qty 100

## 2021-03-12 MED ORDER — TICAGRELOR 90 MG PO TABS
ORAL_TABLET | ORAL | Status: DC | PRN
  Administered 2021-03-12: 180 mg via NASOGASTRIC

## 2021-03-12 MED ORDER — POTASSIUM CHLORIDE 10 MEQ/50ML IV SOLN
10.0000 meq | INTRAVENOUS | Status: AC
  Administered 2021-03-12 (×4): 10 meq via INTRAVENOUS
  Filled 2021-03-12 (×4): qty 50

## 2021-03-12 MED ORDER — SODIUM CHLORIDE 0.9 % IV SOLN: 250.0000 mL | INTRAVENOUS | Status: DC

## 2021-03-12 MED ORDER — NOREPINEPHRINE 4 MG/250ML-% IV SOLN
2.0000 ug/min | INTRAVENOUS | Status: DC
Start: 2021-03-12 — End: 2021-03-12

## 2021-03-12 MED ORDER — FENTANYL CITRATE (PF) 100 MCG/2ML IJ SOLN: 25.0000 ug | Freq: Once | INTRAMUSCULAR | Status: DC

## 2021-03-12 MED ORDER — SODIUM CHLORIDE 0.9% FLUSH
3.0000 mL | Freq: Two times a day (BID) | INTRAVENOUS | Status: DC
  Administered 2021-03-12 – 2021-03-15 (×5): 3 mL via INTRAVENOUS

## 2021-03-12 MED ORDER — SODIUM CHLORIDE 0.9 % IV SOLN
INTRAVENOUS | Status: AC | PRN
  Administered 2021-03-12: 10 mL/h via INTRAVENOUS

## 2021-03-12 MED ORDER — INSULIN ASPART 100 UNIT/ML IJ SOLN
0.0000 [IU] | INTRAMUSCULAR | Status: DC
  Administered 2021-03-12: 5 [IU] via SUBCUTANEOUS
  Administered 2021-03-13 – 2021-03-14 (×3): 2 [IU] via SUBCUTANEOUS
  Administered 2021-03-15: 3 [IU] via SUBCUTANEOUS
  Administered 2021-03-15 (×3): 2 [IU] via SUBCUTANEOUS
  Administered 2021-03-16 – 2021-03-17 (×3): 3 [IU] via SUBCUTANEOUS
  Administered 2021-03-18: 2 [IU] via SUBCUTANEOUS
  Administered 2021-03-18 – 2021-03-19 (×2): 3 [IU] via SUBCUTANEOUS

## 2021-03-12 MED ORDER — DIGOXIN 125 MCG PO TABS: 0.1250 mg | ORAL_TABLET | Freq: Every day | ORAL | Status: DC

## 2021-03-12 MED ORDER — ALBUTEROL SULFATE (2.5 MG/3ML) 0.083% IN NEBU
2.5000 mg | INHALATION_SOLUTION | Freq: Once | RESPIRATORY_TRACT | Status: AC
  Administered 2021-03-12: 2.5 mg via RESPIRATORY_TRACT
  Filled 2021-03-12: qty 3

## 2021-03-12 MED ORDER — SODIUM CHLORIDE 0.9% FLUSH: 10.0000 mL | INTRAVENOUS | Status: DC | PRN

## 2021-03-12 MED ORDER — ASPIRIN 81 MG PO CHEW
81.0000 mg | CHEWABLE_TABLET | Freq: Every day | ORAL | Status: DC
  Administered 2021-03-12 – 2021-03-15 (×4): 81 mg
  Filled 2021-03-12 (×4): qty 1

## 2021-03-12 MED ORDER — SODIUM CHLORIDE 0.9 % IV SOLN
1.0000 g | INTRAVENOUS | Status: DC
  Administered 2021-03-12: 1 g via INTRAVENOUS
  Filled 2021-03-12 (×2): qty 10

## 2021-03-12 NOTE — Progress Notes (Signed)
Chaplain responded to request for family support for post CPR.  Chaplain met the family in the consult room and went to check on the patient.  Chaplain facilitated MD updating family and was able to take family bedside prior to going to Cath Lab.  Patient and significant other have been together 12 years.  Patient is from [place].  Significant other shared about driving to the hospital and having to pull over and call 911 and how traumatic that was for her.  Chaplain offered space for significant other to talk about their life together.  She is [place] and we offered prayer together.  Chaplain let medical team know where family would be waiting.  Significant other holding out hope of a miracle.  Chaplain provided hospitality. Available as needed. [place], [place].    03/12/21 0000  Clinical Encounter Type  Visited With Patient;Family;Health care provider  Visit Type Critical Care  Referral From Nurse  Consult/Referral To Chaplain  Spiritual Encounters  Spiritual Needs Prayer;Emotional

## 2021-03-12 NOTE — Progress Notes (Signed)
NAME:  Logan Brewer MRN:  [id number] DOB:  11/24/1950 LOS: 0 ADMISSION DATE:  03/11/2021 DATE OF SERVICE:  03/11/2021  CHIEF COMPLAINT:  cardiac arrest in the field   HISTORY & PHYSICAL  History of Present Illness  This 65 y.o. African American male presented to the [facility] Emergency Department via EMS CPR in progress. Per verbal report, the patient was being driven to the hospital by a family member after complaining of chest pain; however, the patient became unresponsive en route.  EMS was called from a parking lot.  Family reports patient was down for 10 min without complressions.  ACLS efforts included at least 6 amps of epinephrine, amiodarone boluses (totaling 450 mg) and IV fluids, in addition to shocks and chest compressions.  ROSC was briefly achieved just prior to arrival, but CPR was reinitiated and in progress on arrival.  Subsequently, the patient did have a change out of the [facility] airway placed in the field to a standard endotracheal tube, which did require administration of RSI medications, as the patient was reported to be moving all 4 extremities purposefully.  At the time of clinical interview, the patient is intubated and still under the effects of RSI medications.  Case discussed with Dr. Herbie Baltimore (Interventional Cardiology) at the bedside.  Plans to proceed immediately to cath lab for CODE STEMI noted.  Of note, the patient appeared to have recurrent VF/torsades arrest (at least 2 more bouts during clinical encounter) while in the ER despite amiodarone infusion.  Each bout was responsive to defibrillation x 1.  REVIEW OF SYSTEMS This patient is critically ill and cannot provide additional history nor review of systems due to mental status/unconsciousness and endotracheally intubated.   Past Medical/Surgical/Social/Family History   History reviewed. No pertinent past medical history.  Social History   Tobacco Use   Smoking status: Not on file    Smokeless tobacco: Not on file  Substance Use Topics   Alcohol use: Not on file    History reviewed. No pertinent family history.   Procedures:  9/21: CPR and [place] airway placed in the field. Endotracheally intubated in ER. Shock x 2 in ER   Significant Diagnostic Tests:  LHC: Ost LAD to Prox LAD 99% stenosed, mid LA 30% stenosed   DES placed with 0% residual stenosis  Moderate pulm hypertensino    Micro Data:   Covid neg   Antimicrobials:    Rocephin 9/22>  Interim history/subjective:   Cath lab with DES LAD  Weaning sedation this morning   Objective   BP 100/79   Pulse 92   Temp (!) 94.8 F (34.9 C)   Resp 20   Ht 5\' 9"  (1.753 m)   Wt 87.9 kg   SpO2 100%   BMI 28.62 kg/m  PAP: (25-55)/(15-37) 55/32 CVP:  [8 mmHg-23 mmHg] 17 mmHg CO:  [3.6 L/min] 3.6 L/min CI:  [1.8 L/min/m2] 1.8 L/min/m2  Filed Weights   03/12/21 0158 03/12/21 0200  Weight: 83 kg 87.9 kg    Intake/Output Summary (Last 24 hours) at 03/12/2021 0730 Last data filed at 03/12/2021 0600 Gross per 24 hour  Intake 2020.74 ml  Output 125 ml  Net 1895.74 ml    Vent Mode: PRVC FiO2 (%):  [100 %] 100 % Set Rate:  [25 bmp-28 bmp] 28 bmp Vt Set:  [560 mL] 560 mL PEEP:  [10 cmH20] 10 cmH20 Plateau Pressure:  [18 cmH20-30 cmH20] 30 cmH20   Examination: GENERAL: Critically ill appearing older  adult M intubated sedated  HENT: NCAT pink mmm anicteric sclera ETT secure  CHEST/LUNG: Symmetrical chest expansion, even unlabored on full support.  HEART: rrr s1s2 no rgm cap refill brisk 2+ radial pulses ABDOMEN: soft round ndnt + bowel sounds  EXTREMITIES: no acute joint deformity no cyanosis or clubbing  SKIN:  c/d/w no rash  NEUROLOGIC: sedated. Opens eyes spontaneously. + corneal, gag, withdraws to pain    Resolved Hospital Problem list     Assessment & Plan:   Acute hypoxic respiratory failure Aspiration PNA RUL  P -start rocephin x 5d -WUA SBT after sheath removed. For now keep RASS  0, intubated   Vfib arrest Severe CAD -- s/p DES PCT ostial LAD  -DAPT x 12 mo -cont IV amio then transition to PO  -on lido -- likely come off today, defer to cards  -avoid fever   Lactic acidosis -likely in setting of MI   Acute diastolic HF  Moderate pulmonary hypertension  HTN P -BID metop  -ECHO pending   AKI  Hypokalemia  AGMA P -replace K  -trend renal indices UOP   Hyperglycemia P -SSI   Best practice:  Diet: tube feeds Pain/Anxiety/Delirium protocol (if indicated): YES VAP protocol (if indicated): YES DVT prophylaxis: heparin GI prophylaxis: Protonix Glucose control: check HgbA1c Mobility/Activity: bedrest   Code Status: Full Code Family Communication:   no family at bedside Disposition: ICU   Labs   CBC: Recent Labs  Lab 03/11/21 2245 03/11/21 2300 03/12/21 0008 03/12/21 0059 03/12/21 0101 03/12/21 0200 03/12/21 0512  WBC 9.6  --   --   --   --  23.0* 25.3*  NEUTROABS 3.4  --   --   --   --   --   --   HGB 13.9   < > 14.3 13.9 14.6 15.7 14.4  HCT 44.7   < > 42.0 41.0 43.0 45.1 43.2  MCV 104.7*  --   --   --   --  94.5 96.2  PLT 168  --   --   --   --  269 260   < > = values in this interval not displayed.    Basic Metabolic Panel: Recent Labs  Lab 03/11/21 2245 03/11/21 2300 03/11/21 2339 03/12/21 0008 03/12/21 0059 03/12/21 0101 03/12/21 0200 03/12/21 0512  NA 140 143  143 140 143 144 141  --  136  K 3.6 3.6  3.6 3.5 3.3* 2.9* 3.2*  --  3.4*  CL 107 106  --   --   --   --   --  99  CO2 13*  --   --   --   --   --   --  18*  GLUCOSE 284* 270*  --   --   --   --   --  315*  BUN 15 18  --   --   --   --   --  19  CREATININE 1.79* 1.50*  --   --   --   --  1.79* 2.20*  CALCIUM 7.9*  --   --   --   --   --   --  7.3*  MG  --   --   --   --   --   --  2.7* 3.2*   GFR: Estimated Creatinine Clearance: 35.3 mL/min (A) (by C-G formula based on SCr of 2.2 mg/dL (H)). Recent Labs  Lab 03/11/21 2245 03/11/21 2248 03/12/21 0200  03/12/21 0209 03/12/21 0512 03/12/21 0615  WBC 9.6  --  23.0*  --  25.3*  --   LATICACIDVEN  --  >11.0*  --  8.8*  --  >11.0*    Liver Function Tests: Recent Labs  Lab 03/11/21 2245  AST 481*  ALT 418*  ALKPHOS 56  BILITOT 0.8  PROT 5.2*  ALBUMIN 2.6*   No results for input(s): LIPASE, AMYLASE in the last 168 hours. No results for input(s): AMMONIA in the last 168 hours.  ABG    Component Value Date/Time   PHART 7.277 (L) 03/12/2021 0101   PCO2ART 50.2 (H) 03/12/2021 0101   PO2ART 113 (H) 03/12/2021 0101   HCO3 23.4 03/12/2021 0101   TCO2 25 03/12/2021 0101   ACIDBASEDEF 4.0 (H) 03/12/2021 0101   O2SAT 98.9 03/12/2021 0523     Coagulation Profile: Recent Labs  Lab 03/11/21 2245  INR 1.5*    Cardiac Enzymes: No results for input(s): CKTOTAL, CKMB, CKMBINDEX, TROPONINI in the last 168 hours.  HbA1C: No results found for: HGBA1C  CBG: No results for input(s): GLUCAP in the last 168 hours.  CRITICAL CARE Performed by: Lanier Clam   Total critical care time: 47 minutes  Critical care time was exclusive of separately billable procedures and treating other patients.  Critical care was necessary to treat or prevent imminent or life-threatening deterioration.  Critical care was time spent personally by me on the following activities: development of treatment plan with patient and/or surrogate as well as nursing, discussions with consultants, evaluation of patient's response to treatment, examination of patient, obtaining history from patient or surrogate, ordering and performing treatments and interventions, ordering and review of laboratory studies, ordering and review of radiographic studies, pulse oximetry and re-evaluation of patient's condition.  Tessie Fass MSN, AGACNP-BC [facility] Pulmonary/Critical Care Medicine Amion for pager  03/12/2021, 10:03 AM

## 2021-03-12 NOTE — Procedures (Signed)
Extubation Procedure Note  Patient Details:   Name: Logan Brewer DOB: 11/24/1950 MRN: [id number]   Airway Documentation:    Vent end date: 03/12/21 Vent end time: 1355   Evaluation  O2 sats: stable throughout Complications: No apparent complications Patient did tolerate procedure well. Bilateral Breath Sounds: Diminished, Rhonchi   Yes  RT extubated patient to 5L [place] per MD order with RN at bedside. Positive cuff leak noted. Patient tolerated well. No distress or stridor noted at this time. Sats currently 95%. RT will continue to monitor as needed.   Jaquelyn Bitter 03/12/2021, 2:05 PM

## 2021-03-12 NOTE — Progress Notes (Signed)
CH attempted visit as follow-up to Chaplain Newton's referral this AM; pt. undergoing echocardiogram per RN and no family present.  RN will call if family arrive and chaplain support is needed.  Logan Brewer, Chaplain Pager: [phone]

## 2021-03-12 NOTE — Progress Notes (Addendum)
Advanced Heart Failure Team Consult Note   Primary Physician: Grayce Sessions, NP PCP-Cardiologist:  None  Reason for Consultation: Cardiogenic shock  HPI:    Logan Brewer is seen today for evaluation of cardiogenic shock at the request of Dr. Eldridge Dace with Cardiology. He is a 65 year old male with history of uncontrolled HTN and heavy, longstanding tobacco use. History obtained on review of records. Patient intubated at time of evaluation but following commands.   Patient developed CP on evening of 03/11/21. Around 10 pm, he was on way to ED via his wife and became unresponsive. CPR started when EMS arrived about 10 minutes later. Had recurrent VT/VF arrest and required multiple defibrillations. He was intubated. Given amiodarone bolus followed by drip. Recurrent VF/polymorphic VT requiring defibrillation. Lidocaine started. Severely acidotic with pH 6.9.  ECG with sinus tach, ST elevations in V1-V3, aVL, AVR, I, II, III and ST depressions in V4-V6. Coronary angiogram demonstrated 99% ostial to proximal LAD treated with PCI/DES. Successful defibrillation X 2  for polymorphic VT/VF while crossing into LV. LVEDP initially 45 mmHg > improved to 28 mmHg after furosemide.   Scr 1.79 > 2.20 > 2.68 (baseline not certain)  Lactic acid > 11. HS troponin > 24,0000.   CO-OX 54% this am.  CVP 9  SWAN numbers: CI 1.4 CO 2.87 PA 30/17 (20) PCWP 13 SVR 1800   Echo today: EF 20-25%, akinesis of anteroseptal wall and apex, RV okay, no MR, IVC normal in size  Review of Systems: [y] = yes, [ ]  = no  Limited evaluation as patient currently intubated General: Weight gain [ ] ; Weight loss [ ] ; Anorexia [ ] ; Fatigue [ ] ; Fever [ ] ; Chills [ ] ; Weakness [ ]   Cardiac: Chest pain/pressure [ ] ; Resting SOB [ ] ; Exertional SOB [ ] ; Orthopnea [ ] ; Pedal Edema [ ] ; Palpitations [ ] ; Syncope [ ] ; Presyncope [ ] ; Paroxysmal nocturnal dyspnea[ ]   Pulmonary: Cough [ ] ; Wheezing[ ] ; Hemoptysis[ ] ;  Sputum [ ] ; Snoring [ ]   GI: Vomiting[ ] ; Dysphagia[ ] ; Melena[ ] ; Hematochezia [ ] ; Heartburn[ ] ; Abdominal pain [ ] ; Constipation [ ] ; Diarrhea [ ] ; BRBPR [ ]   GU: Hematuria[ ] ; Dysuria [ ] ; Nocturia[ ]   Vascular: Pain in legs with walking [ ] ; Pain in feet with lying flat [ ] ; Non-healing sores [ ] ; Stroke [ ] ; TIA [ ] ; Slurred speech [ ] ;  Neuro: Headaches[ ] ; Vertigo[ ] ; Seizures[ ] ; Paresthesias[ ] ;Blurred vision [ ] ; Diplopia [ ] ; Vision changes [ ]   Ortho/Skin: Arthritis [ ] ; Joint pain [ ] ; Muscle pain [ ] ; Joint swelling [ ] ; Back Pain [ ] ; Rash [ ]   Psych: Depression[ ] ; Anxiety[ ]   Heme: Bleeding problems [ ] ; Clotting disorders [ ] ; Anemia [ ]   Endocrine: Diabetes [ ] ; Thyroid dysfunction[ ]   Home Medications Prior to Admission medications   Medication Sig Start Date End Date Taking? Authorizing Provider  amLODipine (NORVASC) 10 MG tablet Take 10 mg by mouth daily.   Yes [provider]  hydrochlorothiazide (HYDRODIURIL) 25 MG tablet Take 25 mg by mouth daily.   Yes [provider]    Past Medical History: History reviewed. No pertinent past medical history.  Past Surgical History: Past Surgical History:  Procedure Laterality Date   CORONARY STENT INTERVENTION Brewer/A 03/11/2021   Procedure: CORONARY STENT INTERVENTION;  Surgeon: , MD;  Location: [facility] INVASIVE CV LAB;  Service: Cardiovascular;  Laterality: Brewer/A;   CORONARY/GRAFT ACUTE  MI REVASCULARIZATION Brewer/A 03/11/2021   Procedure: Coronary/Graft Acute MI Revascularization;  Surgeon: Marykay Lex, MD;  Location: [facility] INVASIVE CV LAB;  Service: Cardiovascular;  Laterality: Brewer/A;   RIGHT/LEFT HEART CATH AND CORONARY ANGIOGRAPHY Brewer/A 03/11/2021   Procedure: RIGHT/LEFT HEART CATH AND CORONARY ANGIOGRAPHY;  Surgeon: Marykay Lex, MD;  Location: [facility] INVASIVE CV LAB;  Service: Cardiovascular;  Laterality: Brewer/A;    Family History: History reviewed. No pertinent family history.  Social History: Social  History   Socioeconomic History   Marital status: Single    Spouse name: Not on file   Number of children: Not on file   Years of education: Not on file   Highest education level: Not on file  Occupational History   Not on file  Tobacco Use   Smoking status: Not on file   Smokeless tobacco: Not on file  Substance and Sexual Activity   Alcohol use: Not on file   Drug use: Not on file   Sexual activity: Not on file  Other Topics Concern   Not on file  Social History Narrative   Not on file   Social Determinants of Health   Financial Resource Strain: Not on file  Food Insecurity: Not on file  Transportation Needs: Not on file  Physical Activity: Not on file  Stress: Not on file  Social Connections: Not on file    Allergies:  No Known Allergies  Objective:    Vital Signs:   Temp:  [94.6 F (34.8 C)-97.5 F (36.4 C)] 97.5 F (36.4 C) (09/22 1200) Pulse Rate:  [56-130] 70 (09/22 1200) Resp:  [12-81] 22 (09/22 1200) BP: (88-216)/(16-113) 102/67 (09/22 1200) SpO2:  [71 %-100 %] 100 % (09/22 1200) Arterial Line BP: (81-208)/(25-91) 127/56 (09/22 1200) FiO2 (%):  [60 %-100 %] 60 % (09/22 1055) Weight:  [83 kg-87.9 kg] 87.9 kg (09/22 0200)    Weight change: Filed Weights   03/12/21 0158 03/12/21 0200  Weight: 83 kg 87.9 kg    Intake/Output:   Intake/Output Summary (Last 24 hours) at 03/12/2021 1205 Last data filed at 03/12/2021 0600 Gross per 24 hour  Intake 2020.74 ml  Output 125 ml  Net 1895.74 ml      Physical Exam    General:  AAM in no distress. Intubated, awake on vent HEENT: + ETT Neck: supple. No JVD. Carotids 2+ bilat; no bruits.  Cor: PMI nondisplaced. Regular rate & rhythm. No rubs, gallops or murmurs. Lungs: clear Abdomen: soft, nontender, nondistended. No hepatosplenomegaly. No bruits or masses. Extremities: no cyanosis, clubbing, rash, edema, RFV SWAN Neuro: following commands   Telemetry   No recurrent VT.   EKG    As in  HPI  Labs   Basic Metabolic Panel: Recent Labs  Lab 03/11/21 2245 03/11/21 2300 03/11/21 2339 03/12/21 0008 03/12/21 0059 03/12/21 0101 03/12/21 0200 03/12/21 0512  NA 140 143  143 140 143 144 141  --  136  K 3.6 3.6  3.6 3.5 3.3* 2.9* 3.2*  --  3.4*  CL 107 106  --   --   --   --   --  99  CO2 13*  --   --   --   --   --   --  18*  GLUCOSE 284* 270*  --   --   --   --   --  315*  BUN 15 18  --   --   --   --   --  19  CREATININE 1.79* 1.50*  --   --   --   --  1.79* 2.20*  CALCIUM 7.9*  --   --   --   --   --   --  7.3*  MG  --   --   --   --   --   --  2.7* 3.2*    Liver Function Tests: Recent Labs  Lab 03/11/21 2245  AST 481*  ALT 418*  ALKPHOS 56  BILITOT 0.8  PROT 5.2*  ALBUMIN 2.6*   No results for input(s): LIPASE, AMYLASE in the last 168 hours. No results for input(s): AMMONIA in the last 168 hours.  CBC: Recent Labs  Lab 03/11/21 2245 03/11/21 2300 03/12/21 0008 03/12/21 0059 03/12/21 0101 03/12/21 0200 03/12/21 0512  WBC 9.6  --   --   --   --  23.0* 25.3*  NEUTROABS 3.4  --   --   --   --   --   --   HGB 13.9   < > 14.3 13.9 14.6 15.7 14.4  HCT 44.7   < > 42.0 41.0 43.0 45.1 43.2  MCV 104.7*  --   --   --   --  94.5 96.2  PLT 168  --   --   --   --  269 260   < > = values in this interval not displayed.    Cardiac Enzymes: No results for input(s): CKTOTAL, CKMB, CKMBINDEX, TROPONINI in the last 168 hours.  BNP: BNP (last 3 results) No results for input(s): BNP in the last 8760 hours.  ProBNP (last 3 results) No results for input(s): PROBNP in the last 8760 hours.   CBG: Recent Labs  Lab 03/12/21 1122  GLUCAP 211*    Coagulation Studies: Recent Labs    03/11/21 2245  LABPROT 17.7*  INR 1.5*     Imaging   CARDIAC CATHETERIZATION  Result Date: 03/12/2021   Ost LAD to Prox LAD lesion is 99% stenosed.   Mid LAD lesion is 30% stenosed.   A drug-eluting stent was successfully placed using a STENT ONYX FRONTIER [id number].    Post intervention, there is a 0% residual stenosis.   LV end diastolic pressure is severely elevated.   Hemodynamic findings consistent with moderate pulmonary hypertension.   There is no aortic valve stenosis. SUMMARY: Ischemic Cardiac Arrest-Ventricular Tachycardia-V. fib Severe single-vessel CAD with ostial LAD 95 to 99% thrombotic stenosis and roughly 30% mid LAD.  TIMI I flow Successful DES PCI of the ostial LAD with Onyx Frontier 3.0 mm x 18 mm postdilated to 3.3 mm -> reducing stenosis to 0% with TIMI-3 flow. ACUTE DIASTOLIC HEART FAILURE with opening LVEDP of 45 mmHg-PCWP post PCI and Lasix reduced to 28 mmHg. Moderate pulmonary hypertension with PA pressures 46/30 mmHg - mean 38 mmHg. Ao sat 98%, PA sat 76%. Cardiac Output-Index: (Fick) 5.96-2.96; (thermal) 5.55-2.76. Successful defibrillation x2-on 2 occasions crossing into the left ventricle, the patient decompensated into polymorphic VT on 1 occasion and VF on another.  Both successfully defibrillated, no CPR required. RECOMMENDATIONS Admit to CCU with PCCM managing vent. Monitor for Neurologic Recovery Consider low-dose nitroglycerin infusion for hypertension along with beta-blocker Continue lidocaine for 18 hours, then discontinue Continue amiodarone infusion per protocol and then convert to oral after 24-hour infusion. Continue bicarb drip to be managed by PCCM. Uninterrupted DAPT x 12 months   Port CXR  Result Date: 03/12/2021 CLINICAL DATA:  Cardiac arrest. EXAM: PORTABLE CHEST 1 VIEW  COMPARISON:  March 11, 2021. FINDINGS: The heart size and mediastinal contours are within normal limits. Endotracheal and nasogastric tubes are unchanged in position. Bilateral upper lobe opacities are noted, right greater than left, concerning for pneumonia or possibly edema. The visualized skeletal structures are unremarkable. IMPRESSION: Bilateral upper lobe airspace opacities are again noted, right greater than left, concerning for pneumonia or edema.  Stable support apparatus. Electronically Signed   By: Marijo Conception M.D.   On: 03/12/2021 08:57   DG Chest Portable 1 View  Result Date: 03/11/2021 CLINICAL DATA:  Check endotracheal tube placement, unresponsive on way to ER, recent CPR performed EXAM: PORTABLE CHEST 1 VIEW COMPARISON:  None. FINDINGS: Cardiac shadow is within normal limits. Endotracheal tube and gastric catheter are noted. The proximal side port of the gastric catheter is in the distal esophagus. This should be advanced several cm. Diffuse bilateral airspace opacity is noted worst in the right upper lobe likely related to underlying edema. No bony abnormality is seen. IMPRESSION: Diffuse airspace opacity bilaterally worse on the right likely related underlying edema. Endotracheal tube in satisfactory position. Gastric catheter shows proximal side port within the distal esophagus. This should be advanced several cm deeper into the stomach. Electronically Signed   By: Inez Catalina M.D.   On: 03/11/2021 23:03     Medications:     Current Medications:  aspirin  81 mg Per Tube Daily   atorvastatin  80 mg Per Tube Daily   chlorhexidine gluconate (MEDLINE KIT)  15 mL Mouth Rinse BID   Chlorhexidine Gluconate Cloth  6 each Topical Daily   Chlorhexidine Gluconate Cloth  6 each Topical Daily   heparin  5,000 Units Subcutaneous Q8H   insulin aspart  0-15 Units Subcutaneous Q4H   mouth rinse  15 mL Mouth Rinse 10 times per day   pantoprazole (PROTONIX) IV  40 mg Intravenous QHS   polyethylene glycol  17 g Per Tube Daily   sodium chloride flush  10-40 mL Intracatheter Q12H   sodium chloride flush  3 mL Intravenous Q12H   ticagrelor  90 mg Per Tube BID    Infusions:  sodium chloride 10 mL/hr at 03/12/21 0421   sodium chloride     sodium chloride     sodium chloride     sodium chloride 10 mL/hr at 03/12/21 0600   sodium chloride 10 mL/hr at 03/12/21 0600   amiodarone 30 mg/hr (03/12/21 0600)   cefTRIAXone (ROCEPHIN)  IV 1 g  (03/12/21 1100)   fentaNYL infusion INTRAVENOUS 75 mcg/hr (03/12/21 0600)   propofol (DIPRIVAN) infusion      sodium bicarbonate (isotonic) infusion in sterile water 125 mL/hr at 03/12/21 0600      Patient Profile   Logan Brewer is a 65 year old male with history of uncontrolled hypertension and tobacco use admitted with VF arrest in setting of anterior ST elevation MI with cardiogenic shock.  Assessment/Plan  Cardiogenic shock: -Secondary to anterior ST elevation MI/OOH arrest -EF 20-25% on echo with akinesis of anteroseptal wall and apex -Co-ox marginal. CI 1.4. Will add milrinone 0.125 mg/kg/min, low dose spiro and digoxin 0.125 mg daily -Will keep on IV amiodarone while on ionotrope support -No diuresis. Does not appear overloaded. CVP 9, PCWP 13. Keep SWAN in place to monitor hemodynamics.  2. Anterior ST elevation MI: -s/p PCI/DES to LAD as above -DAPT with aspirin and ticagrelor -no BB yet d/t shock -Continue statin  3. VT/VF arrest: -D/t #2 -No recurrent VT/VF after revascularization -  Lidocaine drip now off -Continues on amiodarone at 30 mg/hr  4. AKI: -Scr 1.7 > 2.68 -D/t shock.  -Baseline not certain.  Continue to monitor  5. Elevated LFTs: -Due to shock liver -Monitor  6. Acute respiratory failure: -Secondary to AMI and shock -Also aspiration pneumonia -Hope to extubate today, discussed with CCM  7. Hypokalemia: -K down to 2.9, up to 3.2 -Replace -Mag okay  8. Tobacco use   Length of Stay: 0  Logan Lantier N, PA-C  03/12/2021, 12:05 PM  Advanced Heart Failure Team Pager [phone] (M-F; 7a - 5p)  Please contact [facility] for night-coverage after hours (4p -7a ) and weekends on [email or web address]   Patient seen and examined with the above-signed Advanced Practice Provider and/or Housestaff. I personally reviewed laboratory data, imaging studies and relevant notes. I independently examined the patient and formulated the important aspects of the plan.  I have edited the note to reflect any of my changes or salient points. I have personally discussed the plan with the patient and/or family.  65 y/o male with HTN, CKD admitted overnight with VF arrest in setting of anterior STEMI.  Had OOH arrest with rapid CPR.   Underwent emergent stenting of ostial LAD with good result. Otherwise minimal CAD. ECHO EF 20-25%. Hstrop > 24K  Currently intubated, awake and falling all commands.   Rhythm stable on amio. Co-ox 54% Has AKI and shocked liver  Swan #s done personally with RNs at bedside CVP 9 PA 30/17 PCWP 13 Thermo 3.1/1.5  General:  Awake on vent Follows all commands HEENT: normal + ETT Neck: supple. JVP 9 Carotids 2+ bilat; no bruits. No lymphadenopathy or thryomegaly appreciated. Cor: PMI nondisplaced. Regular rate & rhythm. No rubs, gallops or murmurs. Lungs: clear Abdomen: soft, nontender, nondistended. No hepatosplenomegaly. No bruits or masses. Good bowel sounds. Extremities: no cyanosis, clubbing, rash, edema RFV swan Neuro: following commands  He has had large anterior MI c/b VF arrest with resultant severe LV dysfunction/shock.  Hemodynamics remain marginal. Will start milrinone 0.125, dig 0.125 daily and low-dose spiro. Leave swan in. Follow hemodynamics and end-organ function.   I have d/w CCM. Hopefully we can extubate this afternoon. Continue amio for now. Likely stop soon.   Wife updated at bedside.  CRITICAL CARE Performed by: Glori Bickers  Total critical care time: 35 minutes  Critical care time was exclusive of separately billable procedures and treating other patients.  Critical care was necessary to treat or prevent imminent or life-threatening deterioration.  Critical care was time spent personally by me (independent of midlevel providers or residents) on the following activities: development of treatment plan with patient and/or surrogate as well as nursing, discussions with consultants, evaluation of  patient's response to treatment, examination of patient, obtaining history from patient or surrogate, ordering and performing treatments and interventions, ordering and review of laboratory studies, ordering and review of radiographic studies, pulse oximetry and re-evaluation of patient's condition.  Glori Bickers, MD  1:08 PM

## 2021-03-12 NOTE — Progress Notes (Signed)
    Patient with stable BP.  Rhythm has been stable so will stop Lidocaine and continue Amio.   Nurse reports that patient woke up and folowed commands.  Also, cardiac index by Ernestine Conrad has been low- 1.4.  Echo reviewed showing apical akinesis with decreased LVEF.  Due to clinical scenario of ongoing symptoms throughtout te day prior to cardiac arrest, he may have significant permanent LV dysfunction.   Will have CHF service eval as well prior to removing PA catheter.   Corky Crafts, MD

## 2021-03-12 NOTE — Progress Notes (Signed)
eLink Physician-Brief Progress Note Patient Name: Logan Brewer DOB: 11/24/1950 MRN: [id number]   Date of Service  03/12/2021  HPI/Events of Note  Nursing request for new temperature management protocol post cardiac arrest.   eICU Interventions  Will order new temperature management protocol post cardiac arrest.      Intervention Category Major Interventions: Other:  Lenell Antu 03/12/2021, 2:34 AM

## 2021-03-12 NOTE — Interval H&P Note (Signed)
History and Physical Interval Note:  03/12/2021 1:46 AM   I saw and evaluate the patient in the emergency room along with Dr.Tannenbaum.  The story was provided by the ER team.  Noted to having chest pain beginning roughly 4:30 PM on 03/11/2021.  He had some nausea and diaphoresis.  Initially symptoms went away and then recurred at 10 PM, at which time his wife decided to take him to the emergency room.  While driving to the ER, he became unresponsive and his wife called EMS.  She did not start compressions, but indicated that it was maybe 5 to 10 minutes for EMS got there to start CPR.  He was found to have VF.  Had a total of 4 defibrillations before ROSC.  He had additional defibrillation upon arrival to the ER.  King airway was exchanged for standard ET tube.  Once stabilized, post ROSC EKG suggested anterior elevations with precordial and lateral ST elevations as well as some inferior leads.  This is concerning for potential multivessel disease versus even left main.  Interestingly, he was hemodynamically stable in the ER with pressures not going below 120s.  During initial evaluation after having received amiodarone bolus and infusion as well as heparin, he did have 3 more runs of V. tach-2 were polymorphic, mom is monomorphic.  All 3 were successfully treated with a single shock-2 defibrillations and 1 cardioversion synchronized.-This prompted initiation of lidocaine drip along with amiodarone.  Plan will be to run the strip for 18 hours.  Once stabilized, he was brought to the cardiac catheterization lab for emergent catheterization based on EKG findings.  He was profoundly acidotic initial VBG with pH of 6.9.  Follow-up ABGs were in the 7.1 range.  He was given 1 amp of bicarb in the ER and additional amp of bicarb upon arrival to the Cath Lab.  (1 more amp was given during the case).  Physical exam showed an intubated sedated gentleman who otherwise appears healthy.  Extremely coarse breath sounds  throughout with diffuse rhonchi.  Very difficult to hear heart sounds, although no obvious murmurs or gallops heard.  Pedal pulses and radial pulses were palpable.  Neurologic exam was difficult because he just been paralyzed for sedation.  Bedside echo revealed relatively normal right heart function, but difficult to fully assess left ventricular wall motion.  There is did appear to be modestly decreased EF, but still relatively vigorous function.  I reviewed the H&P with Dr. Patsi Sears.  The plan listed was prior discussion.  I agree with his findings, exam and recommendations.   Logan Brewer  has presented today for emergent cardiac catheterization, with the diagnosis of Cardiac Arrest Secondary to Acute Anterior STEMI.  The various methods of treatment have been discussed with the patient's family.    After consideration of risks, benefits and other options for treatment, the patient has consented to  Procedure(s): Coronary/Graft Acute MI Revascularization (N/A) RIGHT/LEFT HEART CATH AND CORONARY ANGIOGRAPHY (N/A)  PERCUTANEOUS CORONARY INTERVENTION  as a surgical intervention.  The patient's history has been reviewed, patient examined, no change in status, stable for surgery.  I have reviewed the patient's chart and labs.  Questions were answered to the patient's satisfaction.      Bryan Lemma

## 2021-03-12 NOTE — Progress Notes (Signed)
According to patient's wife, she gave patient's DOB incorrectly upon admission last night. Patient and his wife gave correct DOB, which is Nov 17, 1955. Prior DOB listed was 11/24/1950. DOB corrected in EMR.

## 2021-03-12 NOTE — Progress Notes (Signed)
  Echocardiogram 2D Echocardiogram has been performed.  Logan Brewer 03/12/2021, 10:06 AM

## 2021-03-13 ENCOUNTER — Encounter (HOSPITAL_COMMUNITY): Payer: Self-pay | Admitting: Pulmonary Disease

## 2021-03-13 ENCOUNTER — Other Ambulatory Visit: Payer: Self-pay

## 2021-03-13 ENCOUNTER — Inpatient Hospital Stay (HOSPITAL_COMMUNITY): Payer: Self-pay

## 2021-03-13 ENCOUNTER — Other Ambulatory Visit (HOSPITAL_COMMUNITY): Payer: Self-pay

## 2021-03-13 LAB — COOXEMETRY PANEL
Carboxyhemoglobin: 0.5 % (ref 0.5–1.5)
Carboxyhemoglobin: 1 % (ref 0.5–1.5)
Methemoglobin: 1.4 % (ref 0.0–1.5)
Methemoglobin: 1.6 % — ABNORMAL HIGH (ref 0.0–1.5)
O2 Saturation: 97.3 %
O2 Saturation: 85.6 %
Total hemoglobin: 14.4 g/dL (ref 12.0–16.0)
Total hemoglobin: 13.5 g/dL (ref 12.0–16.0)

## 2021-03-13 LAB — BASIC METABOLIC PANEL
Anion gap: 9 (ref 5–15)
Anion gap: 8 (ref 5–15)
BUN: 21 mg/dL (ref 8–23)
BUN: 18 mg/dL (ref 8–23)
CO2: 26 mmol/L (ref 22–32)
CO2: 25 mmol/L (ref 22–32)
Calcium: 7.7 mg/dL — ABNORMAL LOW (ref 8.9–10.3)
Calcium: 8 mg/dL — ABNORMAL LOW (ref 8.9–10.3)
Chloride: 99 mmol/L (ref 98–111)
Chloride: 103 mmol/L (ref 98–111)
Creatinine, Ser: 2.06 mg/dL — ABNORMAL HIGH (ref 0.61–1.24)
Creatinine, Ser: 1.84 mg/dL — ABNORMAL HIGH (ref 0.61–1.24)
GFR, Estimated: 36 mL/min — ABNORMAL LOW (ref >60)
GFR, Estimated: 41 mL/min — ABNORMAL LOW (ref >60)
Glucose, Bld: 138 mg/dL — ABNORMAL HIGH (ref 70–99)
Glucose, Bld: 132 mg/dL — ABNORMAL HIGH (ref 70–99)
Potassium: 3.8 mmol/L (ref 3.5–5.1)
Potassium: 3.8 mmol/L (ref 3.5–5.1)
Sodium: 134 mmol/L — ABNORMAL LOW (ref 135–145)
Sodium: 136 mmol/L (ref 135–145)

## 2021-03-13 LAB — CBC
HCT: 39.9 % (ref 39.0–52.0)
Hemoglobin: 14 g/dL (ref 13.0–17.0)
MCH: 33.2 pg (ref 26.0–34.0)
MCHC: 35.1 g/dL (ref 30.0–36.0)
MCV: 94.5 fL (ref 80.0–100.0)
Platelets: 186 10*3/uL (ref 150–400)
RBC: 4.22 MIL/uL (ref 4.22–5.81)
RDW: 13.7 % (ref 11.5–15.5)
WBC: 26.8 10*3/uL — ABNORMAL HIGH (ref 4.0–10.5)
nRBC: 0 % (ref 0.0–0.2)

## 2021-03-13 LAB — HEPATIC FUNCTION PANEL
ALT: 340 U/L — ABNORMAL HIGH (ref 0–44)
AST: 598 U/L — ABNORMAL HIGH (ref 15–41)
Albumin: 2.6 g/dL — ABNORMAL LOW (ref 3.5–5.0)
Alkaline Phosphatase: 63 U/L (ref 38–126)
Bilirubin, Direct: <0.1 mg/dL (ref 0.0–0.2)
Total Bilirubin: 0.7 mg/dL (ref 0.3–1.2)
Total Protein: 5.7 g/dL — ABNORMAL LOW (ref 6.5–8.1)

## 2021-03-13 LAB — GLUCOSE, CAPILLARY
Glucose-Capillary: 110 mg/dL — ABNORMAL HIGH (ref 70–99)
Glucose-Capillary: 117 mg/dL — ABNORMAL HIGH (ref 70–99)
Glucose-Capillary: 131 mg/dL — ABNORMAL HIGH (ref 70–99)
Glucose-Capillary: 135 mg/dL — ABNORMAL HIGH (ref 70–99)
Glucose-Capillary: 149 mg/dL — ABNORMAL HIGH (ref 70–99)
Glucose-Capillary: 149 mg/dL — ABNORMAL HIGH (ref 70–99)
Glucose-Capillary: 99 mg/dL (ref 70–99)

## 2021-03-13 LAB — LACTIC ACID, PLASMA
Lactic Acid, Venous: 1.6 mmol/L (ref 0.5–1.9)
Lactic Acid, Venous: 1.5 mmol/L (ref 0.5–1.9)
Lactic Acid, Venous: 1.4 mmol/L (ref 0.5–1.9)

## 2021-03-13 LAB — URIC ACID: Uric Acid, Serum: 7.2 mg/dL (ref 3.7–8.6)

## 2021-03-13 LAB — TROPONIN I (HIGH SENSITIVITY): Troponin I (High Sensitivity): >24000 ng/L (ref <18)

## 2021-03-13 LAB — MAGNESIUM: Magnesium: 2.3 mg/dL (ref 1.7–2.4)

## 2021-03-13 MED ORDER — PIPERACILLIN-TAZOBACTAM 3.375 G IVPB
3.3750 g | Freq: Three times a day (TID) | INTRAVENOUS | Status: DC
  Administered 2021-03-13 – 2021-03-15 (×8): 3.375 g via INTRAVENOUS
  Filled 2021-03-13 (×9): qty 50

## 2021-03-13 MED ORDER — INFLUENZA VAC SPLIT QUAD 0.5 ML IM SUSY: 0.5000 mL | PREFILLED_SYRINGE | INTRAMUSCULAR | Status: DC

## 2021-03-13 MED ORDER — FENTANYL CITRATE PF 50 MCG/ML IJ SOSY
25.0000 ug | PREFILLED_SYRINGE | INTRAMUSCULAR | Status: DC | PRN
  Administered 2021-03-14: 25 ug via INTRAVENOUS
  Filled 2021-03-13: qty 1

## 2021-03-13 MED ORDER — SIMETHICONE 40 MG/0.6ML PO SUSP
40.0000 mg | Freq: Four times a day (QID) | ORAL | Status: DC | PRN
  Filled 2021-03-13: qty 0.6

## 2021-03-13 MED ORDER — SODIUM CHLORIDE 0.9 % IV SOLN
2.0000 g | INTRAVENOUS | Status: DC
  Filled 2021-03-13: qty 20

## 2021-03-13 MED ORDER — POTASSIUM CHLORIDE CRYS ER 20 MEQ PO TBCR
40.0000 meq | EXTENDED_RELEASE_TABLET | Freq: Once | ORAL | Status: AC
  Administered 2021-03-13: 40 meq via ORAL
  Filled 2021-03-13: qty 2

## 2021-03-13 MED ORDER — FENTANYL CITRATE PF 50 MCG/ML IJ SOSY: 12.5000 ug | PREFILLED_SYRINGE | INTRAMUSCULAR | Status: DC | PRN

## 2021-03-13 NOTE — Progress Notes (Signed)
  Patient seen on evening rounds at approximately 730p.   Abdominal CT scan reviewed. C/w ischemic bowel. No perforation.   Abdominal pain improved when I saw him but belly still diffusely tender to palpation and distended. Was passing gas.  BP stable. Cardiac out stable.   Repeat lactate 1.5 -> 1.4.   Remains with low-grade temp.   Continue supportive care.   Additional CCT. 30 mins  Arvilla Meres, MD  10:38 PM

## 2021-03-13 NOTE — Progress Notes (Addendum)
HF CSW reached out to [facility] about screening Mr. Janee Morn for [facility]. It looks like [employer] made an attempt today but the patient was not in his room at the time.   CSW will continue to follow throughout discharge.  Merrisa Skorupski, MSW, LCSWA [phone] Heart Failure Social Worker

## 2021-03-13 NOTE — Progress Notes (Signed)
PT Cancellation Note  Patient Details Name: Logan Brewer MRN: [id number] DOB: December 07, 1955   Cancelled Treatment:    Reason Eval/Treat Not Completed: Patient not medically ready at this time, remains with femoral sheath in RLE and on bedrest. Will continue to follow and evaluate as appropriate.   Vickki Muff, PT, DPT   Acute Rehabilitation Department Pager #: [phone]   Ronnie Derby 03/13/2021, 12:48 PM

## 2021-03-13 NOTE — Progress Notes (Signed)
OT Cancellation Note  Patient Details Name: Logan Brewer MRN: [id number] DOB: 06-17-1956   Cancelled Treatment:    Reason Eval/Treat Not Completed: Active bedrest order (pt with femoral sheath)  Evern Bio 03/13/2021, 12:53 PM Martie Round, OTR/L Acute Rehabilitation Services Pager: [phone] Office: [phone]

## 2021-03-13 NOTE — Progress Notes (Addendum)
NAME:  Logan Brewer MRN:  [id number] DOB:  [birth year] LOS: 1 ADMISSION DATE:  03/11/2021 DATE OF SERVICE:  03/11/2021  CHIEF COMPLAINT:  cardiac arrest in the field   HISTORY & PHYSICAL  History of Present Illness  This 65 y.o. African American male presented to the [facility] via EMS CPR in progress. Per verbal report, the patient was being driven to the hospital by a family member after complaining of chest pain; however, the patient became unresponsive en route.  EMS was called from a parking lot.  Family reports patient was down for 10 min without complressions.  ACLS efforts included at least 6 amps of epinephrine, amiodarone boluses (totaling 450 mg) and IV fluids, in addition to shocks and chest compressions.  ROSC was briefly achieved just prior to arrival, but CPR was reinitiated and in progress on arrival.  Subsequently, the patient did have a change out of the [facility] airway placed in the field to a standard endotracheal tube, which did require administration of RSI medications, as the patient was reported to be moving all 4 extremities purposefully.  At the time of clinical interview, the patient is intubated and still under the effects of RSI medications.  Case discussed with Dr. Herbie Baltimore (Interventional Cardiology) at the bedside.  Plans to proceed immediately to cath lab for CODE STEMI noted.  Of note, the patient appeared to have recurrent VF/torsades arrest (at least 2 more bouts during clinical encounter) while in the ER despite amiodarone infusion.  Each bout was responsive to defibrillation x 1.  Past Medical/Surgical/Social/Family History   Past Medical History:  Diagnosis Date   Smoker     Social History   Tobacco Use   Smoking status: Not on file   Smokeless tobacco: Not on file  Substance Use Topics   Alcohol use: Not on file    History reviewed. No pertinent family history.   Procedures:  9/21: CPR and [facility] airway placed  in the field. Endotracheally intubated in ER. Shock x 2 in ER ETT 9/21-22  Significant Diagnostic Tests:  LHC: Ost LAD to Prox LAD 99% stenosed, mid LA 30% stenosed   DES placed with 0% residual stenosis  Moderate pulm hypertensino    Micro Data:   Covid neg   Antimicrobials:    Rocephin 9/22 -9/23 Zosyn 9/23>   Interim history/subjective:   Extubated yesterday On milrinone   Some abd distention today and pain, had a bloody stool    Objective   BP 123/76 (BP Location: Left Arm)   Pulse 87   Temp 99.5 F (37.5 C) (Core)   Resp (!) 22   Ht 5\' 9"  (1.753 m)   Wt 89.5 kg   SpO2 96%   BMI 29.14 kg/m  PAP: (18-41)/(0-23) 29/12 CVP:  [3 mmHg-23 mmHg] 10 mmHg PCWP:  [13 mmHg] 13 mmHg CO:  [2.9 L/min-7 L/min] 7 L/min CI:  [1.4 L/min/m2-3.5 L/min/m2] 3.5 L/min/m2  Filed Weights   03/12/21 0158 03/12/21 0200 03/13/21 0400  Weight: 83 kg 87.9 kg 89.5 kg    Intake/Output Summary (Last 24 hours) at 03/13/2021 1103 Last data filed at 03/13/2021 0600 Gross per 24 hour  Intake 1054.83 ml  Output 2125 ml  Net -1070.17 ml    Vent Mode: CPAP;PSV FiO2 (%):  [40 %] 40 % PEEP:  [5 cmH20] 5 cmH20 Pressure Support:  [10 cmH20] 10 cmH20   Examination: GENERAL: Ill appearing older adult M reclined in bed NAD  HENT: NCAT  pink mm trachea midline anicteric sclera CHEST/LUNG: CTAb symmetrical chest expansion even and unlabored on RA  HEART: rrr s1s2 no rgm cap refill brisk  ABDOMEN: Soft round, distended. Borderline hypoactive sounds x4.  EXTREMITIES: No acute joint deformity. R Fem  swan SKIN:  c/d/w no rash NEUROLOGIC: AAOx4 following commands    Resolved Hospital Problem list   Acute hypoxic respiratory failure Lactic acidosis  Assessment & Plan:   Suspected sepsis RUL aspiration PNA P -rocephin broadened to zosyn 9/23 -trend WBC fever curve   Vfib arrest Severe CAD -- s/p DES PCT ostial LAD  Cardiogenic shock  Acute diastolic HF Moderate pulmonary HTN HTN   P -DAPT x 12 mo -Cont IV amio  -Dig, aldactone, lipitor -Milrinone per Adv HF   AKI Hypokalemia P -replace K, mag  -trend renal indices UOP   Transaminitis -shock liver, from cardiac arrest P -Trend PRN   Abdominal distention -KUB 9/22 with normal gas pattern -did have blood in stool 9/23 and with persistent WBC count, some concern for possible hypoperfusion. LA has improved pretty remarkably, which could argue against P -CT a/p  -reglan  -NPO for now   Hyperglycemia P -SSI   Best practice:  Diet: NPO Pain/Anxiety/Delirium protocol (if indicated): na VAP protocol (if indicated): Yna DVT prophylaxis: heparin GI prophylaxis: Protonix Glucose control: SSI Mobility/Activity: bedrest   Code Status: Full Code Family Communication:   pt updated  Disposition: ICU   Labs   CBC: Recent Labs  Lab 03/11/21 2245 03/11/21 2300 03/12/21 0101 03/12/21 0200 03/12/21 0512 03/12/21 1944 03/13/21 0325  WBC 9.6  --   --  23.0* 25.3* 27.8* 26.8*  NEUTROABS 3.4  --   --   --   --   --   --   HGB 13.9   < > 14.6 15.7 14.4 14.7 14.0  HCT 44.7   < > 43.0 45.1 43.2 42.5 39.9  MCV 104.7*  --   --  94.5 96.2 94.0 94.5  PLT 168  --   --  269 260 235 186   < > = values in this interval not displayed.    Basic Metabolic Panel: Recent Labs  Lab 03/11/21 2245 03/11/21 2300 03/11/21 2339 03/12/21 0101 03/12/21 0200 03/12/21 0512 03/12/21 1118 03/12/21 1944 03/13/21 0325  NA 140 143  143   < > 141  --  136 136 137 134*  K 3.6 3.6  3.6   < > 3.2*  --  3.4* 3.2* 3.6 3.8  CL 107 106  --   --   --  99 101 102 99  CO2 13*  --   --   --   --  18* 18* 25 26  GLUCOSE 284* 270*  --   --   --  315* 222* 110* 138*  BUN 15 18  --   --   --  19 22 21 21   CREATININE 1.79* 1.50*  --   --  1.79* 2.20* 2.68* 2.10* 2.06*  CALCIUM 7.9*  --   --   --   --  7.3* 7.4* 7.8* 7.7*  MG  --   --   --   --  2.7* 3.2*  --   --  2.3   < > = values in this interval not displayed.    GFR: Estimated Creatinine Clearance: 40.6 mL/min (A) (by C-G formula based on SCr of 2.06 mg/dL (H)). Recent Labs  Lab 03/11/21 2248 03/12/21 0200 03/12/21 0209 03/12/21 0512 03/12/21  0615 03/12/21 1944 03/13/21 0325  WBC  --  23.0*  --  25.3*  --  27.8* 26.8*  LATICACIDVEN >11.0*  --  8.8*  --  >11.0*  --  1.6    Liver Function Tests: Recent Labs  Lab 03/11/21 2245 03/13/21 0325  AST 481* 598*  ALT 418* 340*  ALKPHOS 56 63  BILITOT 0.8 0.7  PROT 5.2* 5.7*  ALBUMIN 2.6* 2.6*   No results for input(s): LIPASE, AMYLASE in the last 168 hours. No results for input(s): AMMONIA in the last 168 hours.  ABG    Component Value Date/Time   PHART 7.277 (L) 03/12/2021 0101   PCO2ART 50.2 (H) 03/12/2021 0101   PO2ART 113 (H) 03/12/2021 0101   HCO3 23.4 03/12/2021 0101   TCO2 25 03/12/2021 0101   ACIDBASEDEF 4.0 (H) 03/12/2021 0101   O2SAT 85.6 03/13/2021 0707     Coagulation Profile: Recent Labs  Lab 03/11/21 2245  INR 1.5*    Cardiac Enzymes: No results for input(s): CKTOTAL, CKMB, CKMBINDEX, TROPONINI in the last 168 hours.  HbA1C: Hgb A1c MFr Bld  Date/Time Value Ref Range Status  03/12/2021 05:12 AM 6.4 (H) 4.8 - 5.6 % Final    Comment:    (NOTE)         Prediabetes: 5.7 - 6.4         Diabetes: >6.4         Glycemic control for adults with diabetes: <7.0     CBG: Recent Labs  Lab 03/12/21 1700 03/12/21 1929 03/13/21 0019 03/13/21 0432 03/13/21 0753  GLUCAP 115* 98 135* 149* 149*    CRITICAL CARE Performed by: Lanier Clam   Total critical care time: 46 minutes  Critical care time was exclusive of separately billable procedures and treating other patients.  Critical care was necessary to treat or prevent imminent or life-threatening deterioration.  Critical care was time spent personally by me on the following activities: development of treatment plan with patient and/or surrogate as well as nursing, discussions with consultants,  evaluation of patient's response to treatment, examination of patient, obtaining history from patient or surrogate, ordering and performing treatments and interventions, ordering and review of laboratory studies, ordering and review of radiographic studies, pulse oximetry and re-evaluation of patient's condition.  Tessie Fass MSN, AGACNP-BC [facility] Amion for pager 03/13/2021, 11:03 AM

## 2021-03-13 NOTE — Plan of Care (Signed)

## 2021-03-13 NOTE — Progress Notes (Addendum)
Advanced Heart Failure Rounding Note  PCP-Cardiologist: None    Patient Profile   Logan Brewer is a 65 year old male admitted with VT/VF arrest secondary to acute anterior ST elevation MI with subsequent respiratory failure and cardiogenic shock   Subjective:     Initial Co-ox 97% from A-line. Repeat 85%.  on 0.125 milrinone  SWAN #s PA 36/14 (24) CO 7.04 CI: 3.45  MAPs via A-line 70s-80s.  CVP 8-9  Temp last night up to 100.9 F. WBC elevated, 9.6 > 25.3 > 27.8 > 26.8.  Complaining of abdominal pain. Abdomen distended. Bloody stools.  Right knee pain also noted.  Scr now trending down 2.68 > 2.1> 2.06     Objective:   Weight Range: 89.5 kg Body mass index is 29.14 kg/m.   Vital Signs:   Temp:  [95 F (35 C)-100.9 F (38.3 C)] 99.5 F (37.5 C) (09/23 0600) Pulse Rate:  [68-101] 83 (09/23 0600) Resp:  [10-31] 24 (09/22 2000) BP: (90-159)/(59-86) 106/70 (09/23 0600) SpO2:  [91 %-100 %] 97 % (09/23 0600) Arterial Line BP: (81-208)/(25-81) 132/58 (09/23 0600) FiO2 (%):  [40 %-100 %] 40 % (09/22 1320) Weight:  [89.5 kg] 89.5 kg (09/23 0400) Last BM Date: 03/12/21  Weight change: Filed Weights   03/12/21 0158 03/12/21 0200 03/13/21 0400  Weight: 83 kg 87.9 kg 89.5 kg    Intake/Output:   Intake/Output Summary (Last 24 hours) at 03/13/2021 0708 Last data filed at 03/13/2021 0600 Gross per 24 hour  Intake 1556.11 ml  Output 2125 ml  Net -568.89 ml      Physical Exam  CVP 8-9 General:  Well appearing. No resp difficulty HEENT: Normal Neck: Supple. JVP .8 Carotids 2+ bilat; no bruits. No lymphadenopathy or thyromegaly appreciated. Cor: PMI nondisplaced. Regular rate & rhythm. No rubs, gallops or murmurs. Lungs: Clear Abdomen: + distended. Tender to palpation. No hepatosplenomegaly. No bruits or masses. Good bowel sounds. Extremities: No cyanosis, clubbing, rash, edema. RFV swan Neuro: Alert & orientedx3, cranial nerves grossly intact. moves all  4 extremities w/o difficulty. Affect pleasant   Telemetry   Sinus 70s-90s, 4 beat run NSVT  EKG    No new  Labs    CBC Recent Labs    03/11/21 2245 03/11/21 2300 03/12/21 1944 03/13/21 0325  WBC 9.6   < > 27.8* 26.8*  NEUTROABS 3.4  --   --   --   HGB 13.9   < > 14.7 14.0  HCT 44.7   < > 42.5 39.9  MCV 104.7*   < > 94.0 94.5  PLT 168   < > 235 186   < > = values in this interval not displayed.   Basic Metabolic Panel Recent Labs    87/86/76 0512 03/12/21 1118 03/12/21 1944 03/13/21 0325  NA 136   < > 137 134*  K 3.4*   < > 3.6 3.8  CL 99   < > 102 99  CO2 18*   < > 25 26  GLUCOSE 315*   < > 110* 138*  BUN 19   < > 21 21  CREATININE 2.20*   < > 2.10* 2.06*  CALCIUM 7.3*   < > 7.8* 7.7*  MG 3.2*  --   --  2.3   < > = values in this interval not displayed.   Liver Function Tests Recent Labs    03/11/21 2245 03/13/21 0325  AST 481* 598*  ALT 418* 340*  ALKPHOS 56 63  BILITOT 0.8 0.7  PROT 5.2* 5.7*  ALBUMIN 2.6* 2.6*   No results for input(s): LIPASE, AMYLASE in the last 72 hours. Cardiac Enzymes No results for input(s): CKTOTAL, CKMB, CKMBINDEX, TROPONINI in the last 72 hours.  BNP: BNP (last 3 results) No results for input(s): BNP in the last 8760 hours.  ProBNP (last 3 results) No results for input(s): PROBNP in the last 8760 hours.   D-Dimer No results for input(s): DDIMER in the last 72 hours. Hemoglobin A1C Recent Labs    03/12/21 0512  HGBA1C 6.4*   Fasting Lipid Panel Recent Labs    03/12/21 0512  CHOL 154  HDL 37*  LDLCALC 103*  TRIG 70  CHOLHDL 4.2   Thyroid Function Tests No results for input(s): TSH, T4TOTAL, T3FREE, THYROIDAB in the last 72 hours.  Invalid input(s): FREET3  Other results:   Imaging    DG Abd Portable 1V  Result Date: 03/12/2021 CLINICAL DATA:  Cardiac arrest. EXAM: PORTABLE ABDOMEN - 1 VIEW COMPARISON:  None. FINDINGS: The bowel gas pattern is normal. No radio-opaque calculi are seen.  Radiopaque contrast is noted within the urinary bladder. IMPRESSION: Negative. Electronically Signed   By: Aram Candela M.D.   On: 03/12/2021 20:08   ECHOCARDIOGRAM COMPLETE  Result Date: 03/12/2021    ECHOCARDIOGRAM REPORT   Patient Name:   Logan Brewer Date of Exam: 03/12/2021 Medical Rec #:  [id number]       Height:       69.0 in Accession #:    [id number]      Weight:       193.8 lb Date of Birth:  11/24/1950        BSA:          2.038 m Patient Age:    70 years        BP:           159/86 mmHg Patient Gender: M               HR:           51 bpm. Exam Location:  Inpatient Procedure: 2D Echo, Cardiac Doppler and Color Doppler Indications:    Acute myocardial infarction, unspecified I21.9  History:        Patient has no prior history of Echocardiogram examinations.  Sonographer:    Eulah Pont RDCS Referring Phys: [phone] DAVID W HARDING IMPRESSIONS  1. Akinesis of the anteroseptal wall and apex with overall severe LV dysfunction.  2. Left ventricular ejection fraction, by estimation, is 20 to 25%. The left ventricle has severely decreased function. The left ventricle demonstrates regional wall motion abnormalities (see scoring diagram/findings for description). There is severe left ventricular hypertrophy. Left ventricular diastolic parameters are consistent with Grade II diastolic dysfunction (pseudonormalization). Elevated left atrial pressure.  3. Right ventricular systolic function is normal. The right ventricular size is normal.  4. The mitral valve is normal in structure. No evidence of mitral valve regurgitation. No evidence of mitral stenosis.  5. The aortic valve is tricuspid. Aortic valve regurgitation is not visualized. No aortic stenosis is present.  6. The inferior vena cava is normal in size with greater than 50% respiratory variability, suggesting right atrial pressure of 3 mmHg. FINDINGS  Left Ventricle: Left ventricular ejection fraction, by estimation, is 20 to 25%. The left ventricle  has severely decreased function. The left ventricle demonstrates regional wall motion abnormalities. The left ventricular internal cavity size was normal  in size. There is severe left ventricular  hypertrophy. Left ventricular diastolic parameters are consistent with Grade II diastolic dysfunction (pseudonormalization). Elevated left atrial pressure. Right Ventricle: The right ventricular size is normal. Right ventricular systolic function is normal. Left Atrium: Left atrial size was normal in size. Right Atrium: Right atrial size was normal in size. Pericardium: There is no evidence of pericardial effusion. Mitral Valve: The mitral valve is normal in structure. No evidence of mitral valve regurgitation. No evidence of mitral valve stenosis. Tricuspid Valve: The tricuspid valve is normal in structure. Tricuspid valve regurgitation is trivial. No evidence of tricuspid stenosis. Aortic Valve: The aortic valve is tricuspid. Aortic valve regurgitation is not visualized. No aortic stenosis is present. Pulmonic Valve: The pulmonic valve was normal in structure. Pulmonic valve regurgitation is not visualized. No evidence of pulmonic stenosis. Aorta: The aortic root is normal in size and structure. Venous: The inferior vena cava is normal in size with greater than 50% respiratory variability, suggesting right atrial pressure of 3 mmHg. IAS/Shunts: No atrial level shunt detected by color flow Doppler. Additional Comments: Akinesis of the anteroseptal wall and apex with overall severe LV dysfunction. A venous catheter is visualized.  LEFT VENTRICLE PLAX 2D LVIDd:         4.20 cm      Diastology LVIDs:         3.50 cm      LV e' medial:    3.32 cm/s LV PW:         1.20 cm      LV E/e' medial:  19.9 LV IVS:        1.10 cm      LV e' lateral:   3.60 cm/s LVOT diam:     2.00 cm      LV E/e' lateral: 18.3 LV SV:         26 LV SV Index:   13 LVOT Area:     3.14 cm  LV Volumes (MOD) LV vol d, MOD A2C: 78.1 ml LV vol d, MOD A4C:  117.0 ml LV vol s, MOD A2C: 64.7 ml LV vol s, MOD A4C: 75.6 ml LV SV MOD A2C:     13.4 ml LV SV MOD A4C:     117.0 ml LV SV MOD BP:      27.7 ml RIGHT VENTRICLE RV S prime:     7.93 cm/s TAPSE (M-mode): 1.4 cm LEFT ATRIUM             Index       RIGHT ATRIUM           Index LA diam:        2.70 cm 1.32 cm/m  RA Area:     12.50 cm LA Vol (A2C):   22.3 ml 10.94 ml/m RA Volume:   33.50 ml  16.43 ml/m LA Vol (A4C):   24.0 ml 11.77 ml/m LA Biplane Vol: 24.0 ml 11.77 ml/m  AORTIC VALVE LVOT Vmax:   57.20 cm/s LVOT Vmean:  35.600 cm/s LVOT VTI:    0.082 m  AORTA Ao Root diam: 3.20 cm Ao Asc diam:  2.90 cm MITRAL VALVE MV Area (PHT): 7.29 cm    SHUNTS MV Decel Time: 104 msec    Systemic VTI:  0.08 m MV E velocity: 66.00 cm/s  Systemic Diam: 2.00 cm MV A velocity: 38.40 cm/s MV E/A ratio:  1.72 Olga Millers MD Electronically signed by Olga Millers MD Signature Date/Time: 03/12/2021/12:18:05 PM    Final      Medications:  Scheduled Medications:  aspirin  81 mg Per Tube Daily   atorvastatin  80 mg Per Tube Daily   chlorhexidine  15 mL Mouth Rinse BID   Chlorhexidine Gluconate Cloth  6 each Topical Daily   Chlorhexidine Gluconate Cloth  6 each Topical Daily   digoxin  0.125 mg Oral Daily   heparin  5,000 Units Subcutaneous Q8H   [START ON 03/14/2021] influenza vac split quadrivalent PF  0.5 mL Intramuscular Tomorrow-1000   insulin aspart  0-15 Units Subcutaneous Q4H   mouth rinse  15 mL Mouth Rinse q12n4p   metoCLOPramide (REGLAN) injection  10 mg Intravenous Q8H   pantoprazole (PROTONIX) IV  40 mg Intravenous QHS   polyethylene glycol  17 g Per Tube Daily   sodium chloride flush  10-40 mL Intracatheter Q12H   sodium chloride flush  3 mL Intravenous Q12H   spironolactone  12.5 mg Oral Daily   ticagrelor  90 mg Per Tube BID    Infusions:  sodium chloride 10 mL/hr at 03/12/21 0421   sodium chloride     sodium chloride     sodium chloride     sodium chloride Stopped (03/12/21 2229)    sodium chloride Stopped (03/13/21 0516)   amiodarone 30 mg/hr (03/13/21 0600)   cefTRIAXone (ROCEPHIN)  IV     milrinone 0.125 mcg/kg/min (03/13/21 0600)    sodium bicarbonate (isotonic) infusion in sterile water Stopped (03/12/21 0730)    PRN Medications: sodium chloride, sodium chloride, acetaminophen, albuterol, docusate, fentaNYL (SUBLIMAZE) injection, ondansetron (ZOFRAN) IV, polyethylene glycol, sodium chloride flush, sodium chloride flush   Assessment/Plan   1. Cardiogenic shock (acute systolic HF) -Secondary to anterior ST elevation MI/OOH arrest -EF 20-25% on echo with akinesis of anteroseptal wall and apex -On 0.125 milrinone. Co-ox 85% MAPS 70s-80s. CO 7.04/CI 3.45. Lactic acid down to 1.6. -Hopefully can wean off milrinone today -Will keep on IV amiodarone while on ionotrope support -No diuresis. Does not appear overloaded. CVP 8-9.  -continue spiro 12.5 mg daily -Continue digoxin 0.125 mg daily -Hopefully add SGLT2i. A1c 6.4% -Will need to consider LifeVest at discharge d/t recurrent VT/VF arrest and severely reduced LV function   2. Anterior ST elevation MI: -s/p PCI/DES to LAD as above -DAPT with aspirin and ticagrelor -no BB yet d/t shock -Continue statin   3. VT/VF arrest: -D/t #2 -Just one 4 beat run NSVT overnight.  -Lidocaine drip now off -Continues on amiodarone at 30 mg/hr   4. AKI: -Scr 1.7 > 2.68 > 2.06 -D/t shock.  -Baseline not certain.  Continue to monitor   5. Elevated LFTs: -Due to shock liver -Monitor -Still trending up. AST 598, ALT 340   6. Acute respiratory failure: -Secondary to AMI and shock -Also aspiration pneumonia - on course of rocephin -Extubated yesterday. 02 sats stable on 5L [place].   7. Hypokalemia: -K down to 2.9, up to 3.8 -Replace -Mag okay   8. Tobacco use:  9. Leukocytosis/fever: - WBC 27, temp up to 100.9 F last night - UA yesterday okay - Chest x-ray yesterday with bilateral upper lobe airspace opacities,  R>L. On course of rocephin for aspiration pneumonia - Repeat x-ray this am pending  10. Right knee pain: -Check uric acid  11. Abdominal pain: - tender to palpation, distended - KUB last night unremarkable. May need repeat imaging today if not improving - bloody stools overnight, less volume this am. Not anemic.  Length of Stay: 1  FINCH, LINDSAY N, PA-C  03/13/2021, 7:08 AM  Advanced Heart Failure Team Pager [phone] (M-F; 7a - 5p)  Please contact [facility] for night-coverage after hours (5p -7a ) and weekends on [email or web address]   Agree with above  Extubated yesterday. Now on milrinone 0.125. Cardiac output way up from yesterday. Having some belly pain with occasional bloody stools.  Lactate 1.6. Hstrop remains elevated > 24K. + febrile overnight. On rocephin. Rhythm stable  General:  Sitting up in bed . No resp difficulty HEENT: normal Neck: supple. JVP 8-9 Carotids 2+ bilat; no bruits. No lymphadenopathy or thryomegaly appreciated. Cor: PMI nondisplaced. Regular rate & rhythm. No rubs, gallops or murmurs. Lungs: clear Abdomen: soft, + tender, nondistended. No hepatosplenomegaly. No bruits or masses. Good bowel sounds. Extremities: no cyanosis, clubbing, rash, edema RFV swan Neuro: alert & orientedx3, cranial nerves grossly intact. moves all 4 extremities w/o difficulty. Affect pleasant  Now extubated. CO rising rapidly with high co-ox and ab pain. Concern for ischemic colitis/early sepsis. Switch to rocephin to zosyn. Repeat KUB. Would not adjust HF meds too aggressively today until possible septic picture plays out more today. Continue DAPT/statin. Continue amio one more day then can stop likely.   CRITICAL CARE Performed by: Arvilla Meres  Total critical care time: 45 minutes  Critical care time was exclusive of separately billable procedures and treating other patients.  Critical care was necessary to treat or prevent imminent or life-threatening  deterioration.  Critical care was time spent personally by me (independent of midlevel providers or residents) on the following activities: development of treatment plan with patient and/or surrogate as well as nursing, discussions with consultants, evaluation of patient's response to treatment, examination of patient, obtaining history from patient or surrogate, ordering and performing treatments and interventions, ordering and review of laboratory studies, ordering and review of radiographic studies, pulse oximetry and re-evaluation of patient's condition.   Arvilla Meres, MD  8:28 AM

## 2021-03-13 NOTE — Progress Notes (Signed)
SLP Cancellation Note  Patient Details Name: Logan Brewer MRN: [id number] DOB: 1955/11/02   Cancelled treatment:       Reason Eval/Treat Not Completed: Other (comment). RN reports [name] screen was done last night, will defer SLP clinical swallow eval today.    Diallo Ponder, Riley Nearing 03/13/2021, 8:56 AM

## 2021-03-13 NOTE — Progress Notes (Signed)
eLink Physician-Brief Progress Note Patient Name: Logan Brewer DOB: 01/11/1956 MRN: [id number]   Date of Service  03/13/2021  HPI/Events of Note  Abdominal pain not relieved by ordered Fentanyl dose. BP = 155/64 by A-line.   eICU Interventions  Plan: Increase Fentanyl to 25-50 mcg IV Q 4 hours PRN pain.      Intervention Category Major Interventions: Other:  Lenell Antu 03/13/2021, 10:21 PM

## 2021-03-13 NOTE — Progress Notes (Addendum)
Pt c/o abdominal pain overnight. Abdomen soft but slightly distended. 2 small bloody/mucus stools. Tmax 100.6.

## 2021-03-14 ENCOUNTER — Inpatient Hospital Stay: Payer: Self-pay

## 2021-03-14 LAB — POCT ACTIVATED CLOTTING TIME
Activated Clotting Time: 0 seconds
Activated Clotting Time: 138 seconds

## 2021-03-14 LAB — CBC
HCT: 36.6 % — ABNORMAL LOW (ref 39.0–52.0)
Hemoglobin: 12.3 g/dL — ABNORMAL LOW (ref 13.0–17.0)
MCH: 32.6 pg (ref 26.0–34.0)
MCHC: 33.6 g/dL (ref 30.0–36.0)
MCV: 97.1 fL (ref 80.0–100.0)
Platelets: 145 10*3/uL — ABNORMAL LOW (ref 150–400)
RBC: 3.77 MIL/uL — ABNORMAL LOW (ref 4.22–5.81)
RDW: 13.6 % (ref 11.5–15.5)
WBC: 20.5 10*3/uL — ABNORMAL HIGH (ref 4.0–10.5)
nRBC: 0 % (ref 0.0–0.2)

## 2021-03-14 LAB — BASIC METABOLIC PANEL
Anion gap: 7 (ref 5–15)
BUN: 15 mg/dL (ref 8–23)
CO2: 24 mmol/L (ref 22–32)
Calcium: 8 mg/dL — ABNORMAL LOW (ref 8.9–10.3)
Chloride: 105 mmol/L (ref 98–111)
Creatinine, Ser: 1.84 mg/dL — ABNORMAL HIGH (ref 0.61–1.24)
GFR, Estimated: 41 mL/min — ABNORMAL LOW (ref >60)
Glucose, Bld: 110 mg/dL — ABNORMAL HIGH (ref 70–99)
Potassium: 3.7 mmol/L (ref 3.5–5.1)
Sodium: 136 mmol/L (ref 135–145)

## 2021-03-14 LAB — GLUCOSE, CAPILLARY
Glucose-Capillary: 118 mg/dL — ABNORMAL HIGH (ref 70–99)
Glucose-Capillary: 129 mg/dL — ABNORMAL HIGH (ref 70–99)
Glucose-Capillary: 113 mg/dL — ABNORMAL HIGH (ref 70–99)
Glucose-Capillary: 136 mg/dL — ABNORMAL HIGH (ref 70–99)
Glucose-Capillary: 116 mg/dL — ABNORMAL HIGH (ref 70–99)
Glucose-Capillary: 108 mg/dL — ABNORMAL HIGH (ref 70–99)

## 2021-03-14 LAB — COOXEMETRY PANEL
Carboxyhemoglobin: 0.6 % (ref 0.5–1.5)
Carboxyhemoglobin: 0.8 % (ref 0.5–1.5)
Carboxyhemoglobin: 0 % — ABNORMAL LOW (ref 0.5–1.5)
Methemoglobin: 1.8 % — ABNORMAL HIGH (ref 0.0–1.5)
Methemoglobin: 1.7 % — ABNORMAL HIGH (ref 0.0–1.5)
Methemoglobin: 0.6 % (ref 0.0–1.5)
O2 Saturation: 98.6 %
O2 Saturation: 72.5 %
O2 Saturation: 62.6 %
Total hemoglobin: 12.8 g/dL (ref 12.0–16.0)
Total hemoglobin: 12.6 g/dL (ref 12.0–16.0)
Total hemoglobin: 15.5 g/dL (ref 12.0–16.0)

## 2021-03-14 LAB — CULTURE, RESPIRATORY W GRAM STAIN: Culture: NORMAL

## 2021-03-14 LAB — MAGNESIUM: Magnesium: 1.9 mg/dL (ref 1.7–2.4)

## 2021-03-14 LAB — LACTIC ACID, PLASMA: Lactic Acid, Venous: 0.8 mmol/L (ref 0.5–1.9)

## 2021-03-14 MED ORDER — MAGNESIUM SULFATE 2 GM/50ML IV SOLN
2.0000 g | Freq: Once | INTRAVENOUS | Status: AC
  Administered 2021-03-14: 2 g via INTRAVENOUS
  Filled 2021-03-14: qty 50

## 2021-03-14 MED ORDER — FUROSEMIDE 10 MG/ML IJ SOLN: 40.0000 mg | Freq: Once | INTRAMUSCULAR | Status: DC

## 2021-03-14 MED ORDER — TRAMADOL HCL 50 MG PO TABS
50.0000 mg | ORAL_TABLET | Freq: Four times a day (QID) | ORAL | Status: DC | PRN
  Administered 2021-03-14 – 2021-03-16 (×2): 50 mg via ORAL
  Filled 2021-03-14 (×2): qty 1

## 2021-03-14 MED ORDER — ENOXAPARIN SODIUM 40 MG/0.4ML IJ SOSY
40.0000 mg | PREFILLED_SYRINGE | INTRAMUSCULAR | Status: DC
  Administered 2021-03-15 – 2021-03-18 (×4): 40 mg via SUBCUTANEOUS
  Filled 2021-03-14 (×4): qty 0.4

## 2021-03-14 MED ORDER — FUROSEMIDE 10 MG/ML IJ SOLN
40.0000 mg | Freq: Once | INTRAMUSCULAR | Status: AC
  Administered 2021-03-14: 40 mg via INTRAVENOUS
  Filled 2021-03-14: qty 4

## 2021-03-14 MED ORDER — HYDRALAZINE HCL 20 MG/ML IJ SOLN
10.0000 mg | INTRAMUSCULAR | Status: DC | PRN
  Administered 2021-03-14: 10 mg via INTRAVENOUS
  Filled 2021-03-14: qty 1

## 2021-03-14 MED ORDER — CARVEDILOL 6.25 MG PO TABS
6.2500 mg | ORAL_TABLET | Freq: Two times a day (BID) | ORAL | Status: DC
  Administered 2021-03-14 – 2021-03-19 (×10): 6.25 mg via ORAL
  Filled 2021-03-14 (×10): qty 1

## 2021-03-14 MED ORDER — SACUBITRIL-VALSARTAN 49-51 MG PO TABS
1.0000 | ORAL_TABLET | Freq: Two times a day (BID) | ORAL | Status: DC
  Administered 2021-03-14 – 2021-03-17 (×6): 1 via ORAL
  Filled 2021-03-14 (×8): qty 1

## 2021-03-14 MED ORDER — FUROSEMIDE 10 MG/ML IJ SOLN
80.0000 mg | Freq: Two times a day (BID) | INTRAMUSCULAR | Status: DC
Start: 2021-03-14 — End: 2021-03-14

## 2021-03-14 MED ORDER — ATROPINE SULFATE 1 MG/10ML IJ SOSY
PREFILLED_SYRINGE | INTRAMUSCULAR | Status: AC
  Filled 2021-03-14: qty 10

## 2021-03-14 MED ORDER — LOSARTAN POTASSIUM 25 MG PO TABS: 25.0000 mg | ORAL_TABLET | Freq: Every day | ORAL | Status: DC

## 2021-03-14 NOTE — Plan of Care (Signed)

## 2021-03-14 NOTE — Progress Notes (Signed)
Peripherally Inserted Central Catheter Placement  The IV Nurse has discussed with the patient and/or persons authorized to consent for the patient, the purpose of this procedure and the potential benefits and risks involved with this procedure.  The benefits include less needle sticks, lab draws from the catheter, and the patient may be discharged home with the catheter. Risks include, but not limited to, infection, bleeding, blood clot (thrombus formation), and puncture of an artery; nerve damage and irregular heartbeat and possibility to perform a PICC exchange if needed/ordered by physician.  Alternatives to this procedure were also discussed.  Bard Power PICC patient education guide, fact sheet on infection prevention and patient information card has been provided to patient /or left at bedside.    PICC Placement Documentation  PICC Double Lumen 03/14/21 PICC Right Basilic 37 cm 0 cm (Active)  Indication for Insertion or Continuance of Line Prolonged intravenous therapies 03/14/21 1100  Exposed Catheter (cm) 0 cm 03/14/21 1100  Site Assessment Clean;Dry;Intact 03/14/21 1100  Lumen #1 Status Flushed;Saline locked;Blood return noted 03/14/21 1100  Lumen #2 Status Flushed;Saline locked;Blood return noted 03/14/21 1100  Dressing Type Transparent;Securing device 03/14/21 1100  Dressing Status Clean;Dry;Intact 03/14/21 1100  Antimicrobial disc in place? Yes 03/14/21 1100  Safety Lock Not Applicable 03/14/21 1100  Line Care Connections checked and tightened 03/14/21 1100  Dressing Intervention New dressing 03/14/21 1100  Dressing Change Due 03/21/21 03/14/21 1100       Logan Brewer [name] 03/14/2021, 11:09 AM

## 2021-03-14 NOTE — Progress Notes (Signed)
NAME:  Logan Brewer MRN:  [id number] DOB:  May 30, 1956 LOS: 2 ADMISSION DATE:  03/11/2021 DATE OF SERVICE:  03/11/2021  CHIEF COMPLAINT:  cardiac arrest in the field   HISTORY & PHYSICAL  History of Present Illness  This 65 y.o. African American male presented to the [facility] Emergency Department via EMS CPR in progress. Per verbal report, the patient was being driven to the hospital by a family member after complaining of chest pain; however, the patient became unresponsive en route.  EMS was called from a parking lot.  Family reports patient was down for 10 min without complressions.  ACLS efforts included at least 6 amps of epinephrine, amiodarone boluses (totaling 450 mg) and IV fluids, in addition to shocks and chest compressions.  ROSC was briefly achieved just prior to arrival, but CPR was reinitiated and in progress on arrival.  Subsequently, the patient did have a change out of the [facility] airway placed in the field to a standard endotracheal tube, which did require administration of RSI medications, as the patient was reported to be moving all 4 extremities purposefully.  At the time of clinical interview, the patient is intubated and still under the effects of RSI medications.  Case discussed with Dr. Herbie Baltimore (Interventional Cardiology) at the bedside.  Plans to proceed immediately to cath lab for CODE STEMI noted.  Of note, the patient appeared to have recurrent VF/torsades arrest (at least 2 more bouts during clinical encounter) while in the ER despite amiodarone infusion.  Each bout was responsive to defibrillation x 1.  Past Medical/Surgical/Social/Family History   Past Medical History:  Diagnosis Date   Smoker     Social History   Tobacco Use   Smoking status: Not on file   Smokeless tobacco: Not on file  Substance Use Topics   Alcohol use: Not on file    History reviewed. No pertinent family history.   Course in hospital  9/22 LHC: Ost LAD to Prox  LAD 99% stenosed, mid LA 30% stenosed  DES placed with 0% residual stenosis  Moderate pulm hypertension EF 25% with anterior akinesis on echo immediately following PCI  9/22 started on milrinone.  9/23 complaining of abdominal pain - CT abdomen showing possible ischemic sigmoiditis. Lactate down-trending.  Started on Zosyn for ischemic colitis.   Interim history/subjective:   Abdominal pain has improved. Occasional bloody bowel movement. Denies SOB. Chest still tender to palpation due to CPR  Objective   BP (!) 165/84   Pulse 81   Temp 99.7 F (37.6 C)   Resp (!) 26   Ht 5\' 9"  (1.753 m)   Wt 89.5 kg   SpO2 93%   BMI 29.14 kg/m  PAP: (18-69)/(3-25) 37/11 CVP:  [3 mmHg-35 mmHg] 16 mmHg CO:  [5.5 L/min-8.5 L/min] 5.5 L/min CI:  [2.7 L/min/m2-4.2 L/min/m2] 2.7 L/min/m2  Filed Weights   03/12/21 0200 03/13/21 0400 03/14/21 0500  Weight: 87.9 kg 89.5 kg 89.5 kg    Intake/Output Summary (Last 24 hours) at 03/14/2021 03/16/2021 Last data filed at 03/14/2021 0600 Gross per 24 hour  Intake 720.74 ml  Output 500 ml  Net 220.74 ml         Examination: GENERAL:  well appearing man who appears younger than stated age, in no distress HENT: NCAT pink mm trachea midline anicteric sclera CHEST/LUNG: chest is clear on 4L/min HEART: JVP 3cm ASA. Apex normal and S1S2 normal S4 +  ABDOMEN: Soft round, distended. Borderline hypoactive sounds x4.  EXTREMITIES: No acute joint deformity. R femora Swan SKIN:  c/d/w no rash NEUROLOGIC: AAOx4 following commands   POC echo shows improved LVSF with EF now 45% with mild anterior hypokinesis.   Relevant labs   ScvO2: 75, Cr: 1.85.   Assessment & Plan:   Critically ill due acute cardiogenic shock due to STEMI requiring milrinone titration.  Ischemic cardiomyopathy S/P DES to prox LAD Hypertension with LVH Smoker 1/2 pack per day Pre-diabetes S/p ischemic VF cardiac arrest. AKI Low flow non-obstructive ischemic sigmoiditis.   Plan:  -  Patient is now hypertensive suggestive of improving cardiac function as evidenced by improved POC echo - will stop milrinone today. Follow up SCVO2 this afternoon and pull PAC if numbers still look good - Diurese today for elevated CVP and O2 requirement - Introduce GDFHT starting with Losartan  - If SCVO2 and BP remain good this afternoon, stop amiodarone and transition to beta-blocker (patient has had no further VF after initial ischemic event) - Continue DAPT and statin - As abdominal pain improving and lactate has cleared, will advance diet - Progressive ambulation once PAC removed.  - Low degree of nicotine addiction.  No cravings at present. Would be good with nicotine gum at discharge.   Best practice:  Diet: heart healthy Pain/Anxiety/Delirium protocol (if indicated): na VAP protocol (if indicated): not required DVT prophylaxis: heparin GI prophylaxis: none indicated.  Glucose control: SSI Mobility/Activity: bedrest   Code Status: Full Code Family Communication:   pt updated  Disposition: ICU   Labs   CBC: Recent Labs  Lab 03/11/21 2245 03/11/21 2300 03/12/21 0200 03/12/21 0512 03/12/21 1944 03/13/21 0325 03/14/21 0306  WBC 9.6  --  23.0* 25.3* 27.8* 26.8* 20.5*  NEUTROABS 3.4  --   --   --   --   --   --   HGB 13.9   < > 15.7 14.4 14.7 14.0 12.3*  HCT 44.7   < > 45.1 43.2 42.5 39.9 36.6*  MCV 104.7*  --  94.5 96.2 94.0 94.5 97.1  PLT 168  --  269 260 235 186 145*   < > = values in this interval not displayed.     Basic Metabolic Panel: Recent Labs  Lab 03/12/21 0200 03/12/21 0512 03/12/21 1118 03/12/21 1944 03/13/21 0325 03/13/21 1445 03/14/21 0306  NA  --  136 136 137 134* 136 136  K  --  3.4* 3.2* 3.6 3.8 3.8 3.7  CL  --  99 101 102 99 103 105  CO2  --  18* 18* 25 26 25 24   GLUCOSE  --  315* 222* 110* 138* 132* 110*  BUN  --  19 22 21 21 18 15   CREATININE 1.79* 2.20* 2.68* 2.10* 2.06* 1.84* 1.84*  CALCIUM  --  7.3* 7.4* 7.8* 7.7* 8.0* 8.0*  MG  2.7* 3.2*  --   --  2.3  --  1.9    GFR: Estimated Creatinine Clearance: 45.5 mL/min (A) (by C-G formula based on SCr of 1.84 mg/dL (H)). Recent Labs  Lab 03/12/21 0512 03/12/21 0615 03/12/21 1944 03/13/21 0325 03/13/21 1432 03/13/21 2032 03/14/21 0306  WBC 25.3*  --  27.8* 26.8*  --   --  20.5*  LATICACIDVEN  --    < >  --  1.6 1.5 1.4 0.8   < > = values in this interval not displayed.     Liver Function Tests: Recent Labs  Lab 03/11/21 2245 03/13/21 0325  AST 481* 598*  ALT  418* 340*  ALKPHOS 56 63  BILITOT 0.8 0.7  PROT 5.2* 5.7*  ALBUMIN 2.6* 2.6*    No results for input(s): LIPASE, AMYLASE in the last 168 hours. No results for input(s): AMMONIA in the last 168 hours.  ABG    Component Value Date/Time   PHART 7.277 (L) 03/12/2021 0101   PCO2ART 50.2 (H) 03/12/2021 0101   PO2ART 113 (H) 03/12/2021 0101   HCO3 23.4 03/12/2021 0101   TCO2 25 03/12/2021 0101   ACIDBASEDEF 4.0 (H) 03/12/2021 0101   O2SAT 72.5 03/14/2021 0610      Coagulation Profile: Recent Labs  Lab 03/11/21 2245  INR 1.5*     Cardiac Enzymes: No results for input(s): CKTOTAL, CKMB, CKMBINDEX, TROPONINI in the last 168 hours.  HbA1C: Hgb A1c MFr Bld  Date/Time Value Ref Range Status  03/12/2021 05:12 AM 6.4 (H) 4.8 - 5.6 % Final    Comment:    (NOTE)         Prediabetes: 5.7 - 6.4         Diabetes: >6.4         Glycemic control for adults with diabetes: <7.0     CBG: Recent Labs  Lab 03/13/21 1959 03/13/21 2337 03/14/21 0300 03/14/21 0645 03/14/21 0857  GLUCAP 131* 99 118* 129* 113*     CRITICAL CARE Performed by: Lynnell Catalan   Total critical care time: 40 minutes  Critical care time was exclusive of separately billable procedures and treating other patients.  Critical care was necessary to treat or prevent imminent or life-threatening deterioration.  Critical care was time spent personally by me on the following activities: development of treatment plan  with patient and/or surrogate as well as nursing, discussions with consultants, evaluation of patient's response to treatment, examination of patient, obtaining history from patient or surrogate, ordering and performing treatments and interventions, ordering and review of laboratory studies, ordering and review of radiographic studies, pulse oximetry and re-evaluation of patient's condition.  Lynnell Catalan, MD [facility] ICU Physician [facility]  Pager: [phone] Or Epic Secure Chat After hours: [phone].  03/14/2021, 9:33 AM     03/14/2021, 9:03 AM

## 2021-03-14 NOTE — Progress Notes (Signed)
Mg 1.9 Replaced per protocol  

## 2021-03-14 NOTE — Progress Notes (Signed)
Advanced Heart Failure Rounding Note  PCP-Cardiologist: None    Patient Profile   Mr. Laymon is a 65 year old male admitted with VT/VF arrest secondary to acute anterior ST elevation MI with subsequent respiratory failure and cardiogenic shock   Subjective:    Developed ischemic bowel yesterday. Confirmed with CT. Still with some belly pain   Tmax 101.1. WBC 26.8 -> 20.5 Lactic acid 1.4 -> 0.8  Still with some ab pain but improved. Received fentanyl overnight. + passing gas.   SBP 150-160s SCr stable 1.8  Co-ox 73% Milrinone stopped this am   Bedside echo ~35-40%   Swan #s done personally at bedside with RN RA 13 PA 38/18 (25) PCWP 17 CO 5.4 CI: 2.63   Objective:   Weight Range: 89.5 kg Body mass index is 29.14 kg/m.   Vital Signs:   Temp:  [99.1 F (37.3 C)-101.1 F (38.4 C)] 99.7 F (37.6 C) (09/24 0645) Pulse Rate:  [75-107] 81 (09/24 0645) Resp:  [17-35] 26 (09/24 0645) BP: (103-178)/(63-120) 165/84 (09/24 0645) SpO2:  [89 %-99 %] 93 % (09/24 0645) Arterial Line BP: (123-191)/(50-89) 163/69 (09/24 0645) Weight:  [89.5 kg] 89.5 kg (09/24 0500) Last BM Date: 03/13/21  Weight change: Filed Weights   03/12/21 0200 03/13/21 0400 03/14/21 0500  Weight: 87.9 kg 89.5 kg 89.5 kg    Intake/Output:   Intake/Output Summary (Last 24 hours) at 03/14/2021 0920 Last data filed at 03/14/2021 0900 Gross per 24 hour  Intake 720.74 ml  Output 1050 ml  Net -329.26 ml       Physical Exam   General:  Lying in bed. No resp difficulty HEENT: normal Neck: supple. JVP to ear Carotids 2+ bilat; no bruits. No lymphadenopathy or thryomegaly appreciated. Cor: PMI nondisplaced. Regular rate & rhythm. No rubs, gallops or murmurs. Lungs: clear Abdomen: soft, minimally tender, mildly distended. No hepatosplenomegaly. No bruits or masses. Good bowel sounds. Extremities: no cyanosis, clubbing, rash, edema RFV swan Neuro: alert & orientedx3, cranial nerves grossly  intact. moves all 4 extremities w/o difficulty. Affect pleasant   Telemetry   Sinus 80s Personally reviewed  Labs    CBC Recent Labs    03/11/21 2245 03/11/21 2300 03/13/21 0325 03/14/21 0306  WBC 9.6   < > 26.8* 20.5*  NEUTROABS 3.4  --   --   --   HGB 13.9   < > 14.0 12.3*  HCT 44.7   < > 39.9 36.6*  MCV 104.7*   < > 94.5 97.1  PLT 168   < > 186 145*   < > = values in this interval not displayed.    Basic Metabolic Panel Recent Labs    89/38/10 0325 03/13/21 1445 03/14/21 0306  NA 134* 136 136  K 3.8 3.8 3.7  CL 99 103 105  CO2 26 25 24   GLUCOSE 138* 132* 110*  BUN 21 18 15   CREATININE 2.06* 1.84* 1.84*  CALCIUM 7.7* 8.0* 8.0*  MG 2.3  --  1.9    Liver Function Tests Recent Labs    03/11/21 2245 03/13/21 0325  AST 481* 598*  ALT 418* 340*  ALKPHOS 56 63  BILITOT 0.8 0.7  PROT 5.2* 5.7*  ALBUMIN 2.6* 2.6*    No results for input(s): LIPASE, AMYLASE in the last 72 hours. Cardiac Enzymes No results for input(s): CKTOTAL, CKMB, CKMBINDEX, TROPONINI in the last 72 hours.  BNP: BNP (last 3 results) No results for input(s): BNP in the last 8760 hours.  ProBNP (last 3 results) No results for input(s): PROBNP in the last 8760 hours.   D-Dimer No results for input(s): DDIMER in the last 72 hours. Hemoglobin A1C Recent Labs    03/12/21 0512  HGBA1C 6.4*    Fasting Lipid Panel Recent Labs    03/12/21 0512  CHOL 154  HDL 37*  LDLCALC 103*  TRIG 70  CHOLHDL 4.2    Thyroid Function Tests No results for input(s): TSH, T4TOTAL, T3FREE, THYROIDAB in the last 72 hours.  Invalid input(s): FREET3  Other results:   Imaging    CT ABDOMEN PELVIS WO CONTRAST  Result Date: 03/13/2021 CLINICAL DATA:  Abdominal distension EXAM: CT ABDOMEN AND PELVIS WITHOUT CONTRAST TECHNIQUE: Multidetector CT imaging of the abdomen and pelvis was performed following the standard protocol without IV contrast. COMPARISON:  None. FINDINGS: Lower chest: Dependent  bibasilar atelectasis.  Heart size is normal. Hepatobiliary: Unremarkable unenhanced appearance of the liver. No focal liver lesion identified. Hyperdense material within the gallbladder lumen likely related to excreted contrast. No pericholecystic inflammatory changes are evident. No discrete gallstone. No biliary dilatation. Pancreas: Unremarkable. No pancreatic ductal dilatation or surrounding inflammatory changes. Spleen: Normal in size without focal abnormality. Adrenals/Urinary Tract: Unremarkable adrenal glands. Small left renal cyst. Kidneys are otherwise unremarkable. No renal stone or hydronephrosis. Bladder wall is mildly thickened. Excreted contrast is noted within the bladder. Stomach/Bowel: Small hiatal hernia. Stomach otherwise within normal limits. There is a small amount of contrast present within the bowel. No dilated loops of bowel to suggest obstruction. Long segment wall thickening and probable mucosal edema within the distal sigmoid colon and rectum. No pericolonic inflammatory changes. Vascular/Lymphatic: Scattered aortoiliac atherosclerotic calcifications without aneurysm. No abdominopelvic lymphadenopathy. Right femoral approach central venous catheter present. Reproductive: Prostate is unremarkable. Other: No free fluid. No abdominopelvic fluid collection. No pneumoperitoneum. No abdominal wall hernia. Musculoskeletal: No acute or significant osseous findings. IMPRESSION: 1. Long segment wall thickening and probable mucosal edema within the distal sigmoid colon and rectum suggestive of an infectious or inflammatory colitis. No evidence of bowel obstruction. 2. Mildly thickened urinary bladder wall. Correlate with urinalysis to exclude cystitis. 3. Small hiatal hernia. Aortic Atherosclerosis (ICD10-I70.0). Electronically Signed   By: Duanne Guess D.O.   On: 03/13/2021 14:17     Medications:     Scheduled Medications:  aspirin  81 mg Per Tube Daily   atorvastatin  80 mg Per Tube  Daily   chlorhexidine  15 mL Mouth Rinse BID   Chlorhexidine Gluconate Cloth  6 each Topical Daily   Chlorhexidine Gluconate Cloth  6 each Topical Daily   digoxin  0.125 mg Oral Daily   furosemide  40 mg Intravenous Once   heparin  5,000 Units Subcutaneous Q8H   influenza vac split quadrivalent PF  0.5 mL Intramuscular Tomorrow-1000   insulin aspart  0-15 Units Subcutaneous Q4H   losartan  25 mg Oral Daily   mouth rinse  15 mL Mouth Rinse q12n4p   metoCLOPramide (REGLAN) injection  10 mg Intravenous Q8H   polyethylene glycol  17 g Per Tube Daily   sodium chloride flush  10-40 mL Intracatheter Q12H   sodium chloride flush  3 mL Intravenous Q12H   spironolactone  12.5 mg Oral Daily   ticagrelor  90 mg Per Tube BID    Infusions:  sodium chloride 10 mL/hr at 03/12/21 0421   sodium chloride     sodium chloride     sodium chloride     sodium chloride 10 mL/hr (  03/14/21 0408)   sodium chloride Stopped (03/13/21 0516)   amiodarone 30 mg/hr (03/14/21 0600)   piperacillin-tazobactam (ZOSYN)  IV 12.5 mL/hr at 03/14/21 0600    PRN Medications: sodium chloride, sodium chloride, acetaminophen, albuterol, docusate, ondansetron (ZOFRAN) IV, polyethylene glycol, simethicone, sodium chloride flush, sodium chloride flush   Assessment/Plan   1. Cardiogenic shock (acute systolic HF) -Secondary to anterior ST elevation MI/OOH arrest -EF 20-25% on echo with akinesis of anteroseptal wall and apex - Bedside echo EF 35-40% 03/14/21 -Off milrinone. Swan numbers improved  - BP high  - lasix 40 IV x 1 - Add entresto 49/51 bid -continue spiro 12.5 mg daily -Continue digoxin 0.125 mg daily - Possible b-blocker in am  -Hopefully add SGLT2i. A1c 6.4% - Place PICC. Remove swan  - Repeat limited echo    2. Anterior ST elevation MI: -s/p PCI/DES to LAD as above -DAPT with aspirin and ticagrelor - No angina -Continue statin. Hopefully can start b-blocker tomorrow - CR seeing   3. VT/VF  arrest: -D/t #2 - stable can stop amio   4. Ischemic bowel - confirmed on CT. No perf - clinically improving  - lactate coming down. Pain improved - continue zosyn - keep SBP > 120    5. AKI: -Scr 1.7 > 2.68 > 2.06 > 1.8 -D/t ATN/shock.  -Baseline not certain.  Continue to monitor   6. Elevated LFTs: -Due to shock liver -Monitor   6. Acute respiratory failure: -Secondary to AMI and shock -Extubated 9/22   7. Hypokalemia: -K 3.7 -Replace -Mag okay   8. Tobacco use:  9. Leukocytosis/fever: - likely due to MI and ischemic bowel - WBC and fever curve improving - continue zosyn and supportive care  CRITICAL CARE Performed by: Arvilla Meres  Total critical care time: 35 minutes  Critical care time was exclusive of separately billable procedures and treating other patients.  Critical care was necessary to treat or prevent imminent or life-threatening deterioration.  Critical care was time spent personally by me (independent of midlevel providers or residents) on the following activities: development of treatment plan with patient and/or surrogate as well as nursing, discussions with consultants, evaluation of patient's response to treatment, examination of patient, obtaining history from patient or surrogate, ordering and performing treatments and interventions, ordering and review of laboratory studies, ordering and review of radiographic studies, pulse oximetry and re-evaluation of patient's condition.   Length of Stay: 2  Arvilla Meres, MD  03/14/2021, 9:20 AM  Advanced Heart Failure Team Pager [phone] (M-F; 7a - 5p)  Please contact [facility] for night-coverage after hours (5p -7a ) and weekends on [email or web address]

## 2021-03-14 NOTE — Discharge Instructions (Signed)

## 2021-03-14 NOTE — Progress Notes (Signed)
Nutrition Education Note  RD consulted for nutrition education regarding CHF and diabetes.  Lab Results  Component Value Date   HGBA1C 6.4 (H) 03/12/2021   PTA DM medications are none.   Labs reviewed: CBGS: 113-129 (inpatient orders for glycemic control are 0-15 units insulin aspart every 4 hours).    Pt unavailable at time of visit.   RD provided "Heart Healthy, Consistent Carbohydrate Nutrition Therapy" handout from the Academy of [employer] Dietetics. Attached to AVS/ discharge summary.  RD will re-attempt education once pt is more medically stable (transferred out of ICU).    Current diet order is heart healthy/ carb modified, patient is consuming approximately n/a% of meals at this time. Labs and medications reviewed. No further nutrition interventions warranted at this time. RD contact information provided. If additional nutrition issues arise, please re-consult RD.   Levada Schilling, RD, LDN, CDCES Registered Dietitian II Certified Diabetes Care and Education Specialist Please refer to [facility] for RD and/or RD on-call/weekend/after hours pager

## 2021-03-14 NOTE — Progress Notes (Signed)
Removed arterial and venous femoral sheaths with Theone Murdoch catheter per order at 1400 with Delories Heinz RN. ACT 138. Pressure held for a total of 50 minutes with pulse and BP checks per protocol. No hematoma noted. Pulses palpable throughout. Pressure dressing applied and bed rest set to end at 2100.

## 2021-03-15 ENCOUNTER — Encounter (HOSPITAL_COMMUNITY): Payer: Self-pay | Admitting: Pulmonary Disease

## 2021-03-15 LAB — CBC
HCT: 39.4 % (ref 39.0–52.0)
Hemoglobin: 13.5 g/dL (ref 13.0–17.0)
MCH: 32.8 pg (ref 26.0–34.0)
MCHC: 34.3 g/dL (ref 30.0–36.0)
MCV: 95.6 fL (ref 80.0–100.0)
Platelets: 137 10*3/uL — ABNORMAL LOW (ref 150–400)
RBC: 4.12 MIL/uL — ABNORMAL LOW (ref 4.22–5.81)
RDW: 13.3 % (ref 11.5–15.5)
WBC: 17.9 10*3/uL — ABNORMAL HIGH (ref 4.0–10.5)
nRBC: 0 % (ref 0.0–0.2)

## 2021-03-15 LAB — COOXEMETRY PANEL
Carboxyhemoglobin: 0.4 % — ABNORMAL LOW (ref 0.5–1.5)
Methemoglobin: 0.7 % (ref 0.0–1.5)
O2 Saturation: 69.8 %
Total hemoglobin: 12.3 g/dL (ref 12.0–16.0)

## 2021-03-15 LAB — BASIC METABOLIC PANEL
Anion gap: 8 (ref 5–15)
BUN: 20 mg/dL (ref 8–23)
CO2: 24 mmol/L (ref 22–32)
Calcium: 8.1 mg/dL — ABNORMAL LOW (ref 8.9–10.3)
Chloride: 101 mmol/L (ref 98–111)
Creatinine, Ser: 1.68 mg/dL — ABNORMAL HIGH (ref 0.61–1.24)
GFR, Estimated: 45 mL/min — ABNORMAL LOW (ref >60)
Glucose, Bld: 175 mg/dL — ABNORMAL HIGH (ref 70–99)
Potassium: 3.4 mmol/L — ABNORMAL LOW (ref 3.5–5.1)
Sodium: 133 mmol/L — ABNORMAL LOW (ref 135–145)

## 2021-03-15 LAB — GLUCOSE, CAPILLARY
Glucose-Capillary: 122 mg/dL — ABNORMAL HIGH (ref 70–99)
Glucose-Capillary: 127 mg/dL — ABNORMAL HIGH (ref 70–99)
Glucose-Capillary: 147 mg/dL — ABNORMAL HIGH (ref 70–99)
Glucose-Capillary: 160 mg/dL — ABNORMAL HIGH (ref 70–99)
Glucose-Capillary: 164 mg/dL — ABNORMAL HIGH (ref 70–99)
Glucose-Capillary: 90 mg/dL (ref 70–99)

## 2021-03-15 LAB — MAGNESIUM: Magnesium: 2 mg/dL (ref 1.7–2.4)

## 2021-03-15 MED ORDER — DAPAGLIFLOZIN PROPANEDIOL 5 MG PO TABS: 5.0000 mg | ORAL_TABLET | Freq: Every day | ORAL | Status: DC

## 2021-03-15 MED ORDER — TICAGRELOR 90 MG PO TABS
90.0000 mg | ORAL_TABLET | Freq: Two times a day (BID) | ORAL | Status: DC
  Administered 2021-03-15 – 2021-03-19 (×8): 90 mg via ORAL
  Filled 2021-03-15 (×7): qty 1

## 2021-03-15 MED ORDER — DAPAGLIFLOZIN PROPANEDIOL 10 MG PO TABS
10.0000 mg | ORAL_TABLET | Freq: Every day | ORAL | Status: DC
  Administered 2021-03-16 – 2021-03-19 (×4): 10 mg via ORAL
  Filled 2021-03-15 (×6): qty 1

## 2021-03-15 MED ORDER — ATORVASTATIN CALCIUM 80 MG PO TABS
80.0000 mg | ORAL_TABLET | Freq: Every day | ORAL | Status: DC
  Administered 2021-03-16 – 2021-03-19 (×4): 80 mg via ORAL
  Filled 2021-03-15 (×4): qty 1

## 2021-03-15 NOTE — Progress Notes (Addendum)
Advanced Heart Failure Rounding Note  PCP-Cardiologist: None    Patient Profile   Logan Brewer is a 65 year old male admitted with VT/VF arrest secondary to acute anterior ST elevation MI with subsequent respiratory failure and cardiogenic shock   Subjective:    Developed ischemic bowel 9/23. Confirmed with CT.   Feeling much better today. Off inotropes. Able to eat and ambulate. Belly pain improved. Denies CP, SOB, orthopnea or PND.  Co-ox 70%  Bedside echo ~35-40%   Objective:   Weight Range: 81.1 kg Body mass index is 26.4 kg/m.   Vital Signs:   Temp:  [99.1 F (37.3 C)-100 F (37.8 C)] 99.7 F (37.6 C) (09/25 0718) Pulse Rate:  [66-99] 75 (09/25 0600) Resp:  [15-39] 29 (09/25 0800) BP: (91-172)/(53-120) 132/77 (09/25 0800) SpO2:  [90 %-98 %] 96 % (09/25 0600) Arterial Line BP: (153-188)/(53-85) 185/74 (09/24 1345) Weight:  [81.1 kg] 81.1 kg (09/25 0432) Last BM Date: 03/15/21  Weight change: Filed Weights   03/13/21 0400 03/14/21 0500 03/15/21 0432  Weight: 89.5 kg 89.5 kg 81.1 kg    Intake/Output:   Intake/Output Summary (Last 24 hours) at 03/15/2021 0917 Last data filed at 03/15/2021 0600 Gross per 24 hour  Intake 453.45 ml  Output 3870 ml  Net -3416.55 ml     Physical Exam   General:  Lying in bed. well appearing. No resp difficulty HEENT: normal Neck: supple. no JVD. Carotids 2+ bilat; no bruits. No lymphadenopathy or thryomegaly appreciated. Cor: PMI nondisplaced. Regular rate & rhythm. No rubs, gallops or murmurs. Lungs: clear Abdomen: soft, nontender, nondistended. No hepatosplenomegaly. No bruits or masses. Good bowel sounds. Extremities: no cyanosis, clubbing, rash, edema Neuro: alert & orientedx3, cranial nerves grossly intact. moves all 4 extremities w/o difficulty. Affect pleasant  Telemetry   Sinus 70-80 Personally reviewed  Labs    CBC Recent Labs    03/14/21 0306 03/15/21 0450  WBC 20.5* 17.9*  HGB 12.3* 13.5  HCT  36.6* 39.4  MCV 97.1 95.6  PLT 145* 137*    Basic Metabolic Panel Recent Labs    85/88/50 1445 03/14/21 0306 03/15/21 0450  NA 136 136  --   K 3.8 3.7  --   CL 103 105  --   CO2 25 24  --   GLUCOSE 132* 110*  --   BUN 18 15  --   CREATININE 1.84* 1.84*  --   CALCIUM 8.0* 8.0*  --   MG  --  1.9 2.0    Liver Function Tests Recent Labs    03/13/21 0325  AST 598*  ALT 340*  ALKPHOS 63  BILITOT 0.7  PROT 5.7*  ALBUMIN 2.6*    No results for input(s): LIPASE, AMYLASE in the last 72 hours. Cardiac Enzymes No results for input(s): CKTOTAL, CKMB, CKMBINDEX, TROPONINI in the last 72 hours.  BNP: BNP (last 3 results) No results for input(s): BNP in the last 8760 hours.  ProBNP (last 3 results) No results for input(s): PROBNP in the last 8760 hours.   D-Dimer No results for input(s): DDIMER in the last 72 hours. Hemoglobin A1C No results for input(s): HGBA1C in the last 72 hours.  Fasting Lipid Panel No results for input(s): CHOL, HDL, LDLCALC, TRIG, CHOLHDL, LDLDIRECT in the last 72 hours.  Thyroid Function Tests No results for input(s): TSH, T4TOTAL, T3FREE, THYROIDAB in the last 72 hours.  Invalid input(s): FREET3  Other results:   Imaging    [place] EKG SITE RITE  Result Date: 03/14/2021 If Site Rite image not attached, placement could not be confirmed due to current cardiac rhythm.    Medications:     Scheduled Medications:  aspirin  81 mg Per Tube Daily   atorvastatin  80 mg Per Tube Daily   carvedilol  6.25 mg Oral BID WC   chlorhexidine  15 mL Mouth Rinse BID   Chlorhexidine Gluconate Cloth  6 each Topical Daily   Chlorhexidine Gluconate Cloth  6 each Topical Daily   digoxin  0.125 mg Oral Daily   enoxaparin (LOVENOX) injection  40 mg Subcutaneous Q24H   insulin aspart  0-15 Units Subcutaneous Q4H   mouth rinse  15 mL Mouth Rinse q12n4p   polyethylene glycol  17 g Per Tube Daily   sacubitril-valsartan  1 tablet Oral BID   sodium chloride  flush  10-40 mL Intracatheter Q12H   sodium chloride flush  3 mL Intravenous Q12H   spironolactone  12.5 mg Oral Daily   ticagrelor  90 mg Per Tube BID    Infusions:  sodium chloride     sodium chloride     sodium chloride Stopped (03/13/21 0516)   piperacillin-tazobactam (ZOSYN)  IV 3.375 g (03/15/21 0702)    PRN Medications: sodium chloride, acetaminophen, albuterol, docusate, hydrALAZINE, ondansetron (ZOFRAN) IV, polyethylene glycol, simethicone, sodium chloride flush, sodium chloride flush, traMADol   Assessment/Plan   1. Cardiogenic shock (acute systolic HF) -Secondary to anterior ST elevation MI/OOH arrest -EF 20-25% on echo with akinesis of anteroseptal wall and apex - Bedside echo EF 35-40% 03/14/21. Full echo pending - Co-ox 70% CVP 5-6 - Continue entresto 49/51 bid - Continue spiro 12.5 mg daily - Continue digoxin 0.125 mg daily - Continue carvedilol 6.25 bid - Add Jardiance tomorrow if can afford (no insurance) - Repeat limited echo  - Transfer to floor   2. CAD/Anterior ST elevation MI: - s/p PCI/DES to LAD as above - DAPT with aspirin and ticagrelor - No s/s angina - Continue statin.  - CR seeing   3. VT/VF arrest: -D/t #2 - stable can stop amio   4. Ischemic bowel - confirmed on CT. No perf - clinically improved   5. AKI: -Scr 1.7 > 2.68 > 2.06 > 1.8 > pending -D/t ATN/shock.  -Baseline not certain.  Continue to monitor   6. Elevated LFTs: -Due to shock liver -Monitor   6. Acute respiratory failure: -Secondary to AMI and shock -Extubated 9/22   7. Hypokalemia: -K 3.7 -Replace -Mag 2.0   8. Tobacco use:  9. Leukocytosis/fever: - likely due to MI and ischemic bowel - WBC and fever curve improving - continue zosyn for 5 day course  OK to transfer to floor  Length of Stay: 3  Arvilla Meres, MD  03/15/2021, 9:17 AM  Advanced Heart Failure Team Pager [phone] (M-F; 7a - 5p)  Please contact [facility] for night-coverage  after hours (5p -7a ) and weekends on [email or web address]

## 2021-03-15 NOTE — Progress Notes (Signed)
NAME:  Logan Brewer MRN:  [id number] DOB:  1956/03/01 LOS: 3 ADMISSION DATE:  03/11/2021 DATE OF SERVICE:  03/11/2021  CHIEF COMPLAINT:  cardiac arrest in the field   HISTORY & PHYSICAL  History of Present Illness  This 65 y.o. African American male presented to the [facility] Emergency Department via EMS CPR in progress. Per verbal report, the patient was being driven to the hospital by a family member after complaining of chest pain; however, the patient became unresponsive en route.  EMS was called from a parking lot.  Family reports patient was down for 10 min without complressions.  ACLS efforts included at least 6 amps of epinephrine, amiodarone boluses (totaling 450 mg) and IV fluids, in addition to shocks and chest compressions.  ROSC was briefly achieved just prior to arrival, but CPR was reinitiated and in progress on arrival.  Subsequently, the patient did have a change out of the [facility] airway placed in the field to a standard endotracheal tube, which did require administration of RSI medications, as the patient was reported to be moving all 4 extremities purposefully.  At the time of clinical interview, the patient is intubated and still under the effects of RSI medications.  Case discussed with Dr. Herbie Baltimore (Interventional Cardiology) at the bedside.  Plans to proceed immediately to cath lab for CODE STEMI noted.  Of note, the patient appeared to have recurrent VF/torsades arrest (at least 2 more bouts during clinical encounter) while in the ER despite amiodarone infusion.  Each bout was responsive to defibrillation x 1.  Past Medical/Surgical/Social/Family History   Past Medical History:  Diagnosis Date   Smoker     Social History   Tobacco Use   Smoking status: Not on file   Smokeless tobacco: Not on file  Substance Use Topics   Alcohol use: Not on file    History reviewed. No pertinent family history.   Course in hospital  9/22 LHC: Ost LAD to Prox  LAD 99% stenosed, mid LA 30% stenosed  DES placed with 0% residual stenosis  Moderate pulm hypertension EF 25% with anterior akinesis on echo immediately following PCI  9/22 started on milrinone.  9/23 complaining of abdominal pain - CT abdomen showing possible ischemic sigmoiditis. Lactate down-trending.  Started on Zosyn for ischemic colitis.   Interim history/subjective:   Abdominal pain has improved and the patient is ambulating. Denies chest pain or SOB. Tolerating oral intake and had non-bloody bowel movement. Single episode of blood tinged sputum  Objective   BP 120/69   Pulse 82   Temp 99.3 F (37.4 C) (Oral)   Resp (!) 23   Ht 5\' 9"  (1.753 m)   Wt 81.1 kg   SpO2 96%   BMI 26.40 kg/m  CVP:  [0 mmHg-13 mmHg] 7 mmHg  Filed Weights   03/13/21 0400 03/14/21 0500 03/15/21 0432  Weight: 89.5 kg 89.5 kg 81.1 kg    Intake/Output Summary (Last 24 hours) at 03/15/2021 1447 Last data filed at 03/15/2021 1300 Gross per 24 hour  Intake 390.04 ml  Output 1820 ml  Net -1429.96 ml         Examination: GENERAL:  well appearing man who appears younger than stated age, in no distress HENT: NCAT pink mm trachea midline anicteric sclera CHEST/LUNG: chest is clear on room air HEART: JVP 3cm ASA. Apex normal and S1S2 normal S4 +  ABDOMEN: Soft round, distended. Borderline hypoactive sounds x4.  EXTREMITIES: No acute joint deformity. R  femora Swan SKIN:  c/d/w no rash NEUROLOGIC: AAOx4 following commands   POC echo shows improved LVSF with EF now 45% with mild anterior hypokinesis.   Relevant labs   ScvO2: 70, Cr: 1.68.   Assessment & Plan:   Was critically ill due acute cardiogenic shock due to STEMI requiring milrinone titration.  Ischemic cardiomyopathy S/P DES to prox LAD Hypertension with LVH Smoker 1/2 pack per day Pre-diabetes S/p ischemic VF cardiac arrest. AKI Low flow non-obstructive ischemic sigmoiditis.   Plan:  - Now off milrinone with good clinical  perfusion and normal ScvO2 - ready to transfer.  - Continue current GDHFT doses - may need further Coreg optimization  - Continue DAPT and statin - Continue current diet - Progressive ambulation, phase 1 cardiac rehabilitation - Start Farxiga for Prediabetes and HF.  - Low degree of nicotine addiction.  No cravings at present. Would be good with nicotine gum at discharge.  - Monitor sputum and re-consult PCCM if persists.   Best practice:  Diet: heart healthy Pain/Anxiety/Delirium protocol (if indicated): na VAP protocol (if indicated): not required DVT prophylaxis: heparin GI prophylaxis: none indicated.  Glucose control: SSI Mobility/Activity: bedrest   Code Status: Full Code Family Communication:   pt updated  Disposition: transfer to telemetry. Orders reconciled and TRH notified.    Labs   CBC: Recent Labs  Lab 03/11/21 2245 03/11/21 2300 03/12/21 0512 03/12/21 1944 03/13/21 0325 03/14/21 0306 03/15/21 0450  WBC 9.6   < > 25.3* 27.8* 26.8* 20.5* 17.9*  NEUTROABS 3.4  --   --   --   --   --   --   HGB 13.9   < > 14.4 14.7 14.0 12.3* 13.5  HCT 44.7   < > 43.2 42.5 39.9 36.6* 39.4  MCV 104.7*   < > 96.2 94.0 94.5 97.1 95.6  PLT 168   < > 260 235 186 145* 137*   < > = values in this interval not displayed.     Basic Metabolic Panel: Recent Labs  Lab 03/12/21 0200 03/12/21 0512 03/12/21 1118 03/12/21 1944 03/13/21 0325 03/13/21 1445 03/14/21 0306 03/15/21 0450 03/15/21 0838  NA  --  136   < > 137 134* 136 136  --  133*  K  --  3.4*   < > 3.6 3.8 3.8 3.7  --  3.4*  CL  --  99   < > 102 99 103 105  --  101  CO2  --  18*   < > 25 26 25 24   --  24  GLUCOSE  --  315*   < > 110* 138* 132* 110*  --  175*  BUN  --  19   < > 21 21 18 15   --  20  CREATININE 1.79* 2.20*   < > 2.10* 2.06* 1.84* 1.84*  --  1.68*  CALCIUM  --  7.3*   < > 7.8* 7.7* 8.0* 8.0*  --  8.1*  MG 2.7* 3.2*  --   --  2.3  --  1.9 2.0  --    < > = values in this interval not displayed.     GFR: Estimated Creatinine Clearance: 45 mL/min (A) (by C-G formula based on SCr of 1.68 mg/dL (H)). Recent Labs  Lab 03/12/21 1944 03/13/21 0325 03/13/21 1432 03/13/21 2032 03/14/21 0306 03/15/21 0450  WBC 27.8* 26.8*  --   --  20.5* 17.9*  LATICACIDVEN  --  1.6 1.5 1.4 0.8  --  Liver Function Tests: Recent Labs  Lab 03/11/21 2245 03/13/21 0325  AST 481* 598*  ALT 418* 340*  ALKPHOS 56 63  BILITOT 0.8 0.7  PROT 5.2* 5.7*  ALBUMIN 2.6* 2.6*    No results for input(s): LIPASE, AMYLASE in the last 168 hours. No results for input(s): AMMONIA in the last 168 hours.  ABG    Component Value Date/Time   PHART 7.277 (L) 03/12/2021 0101   PCO2ART 50.2 (H) 03/12/2021 0101   PO2ART 113 (H) 03/12/2021 0101   HCO3 23.4 03/12/2021 0101   TCO2 25 03/12/2021 0101   ACIDBASEDEF 4.0 (H) 03/12/2021 0101   O2SAT 69.8 03/15/2021 0442      Coagulation Profile: Recent Labs  Lab 03/11/21 2245  INR 1.5*     Cardiac Enzymes: No results for input(s): CKTOTAL, CKMB, CKMBINDEX, TROPONINI in the last 168 hours.  HbA1C: Hgb A1c MFr Bld  Date/Time Value Ref Range Status  03/12/2021 05:12 AM 6.4 (H) 4.8 - 5.6 % Final    Comment:    (NOTE)         Prediabetes: 5.7 - 6.4         Diabetes: >6.4         Glycemic control for adults with diabetes: <7.0     CBG: Recent Labs  Lab 03/14/21 2055 03/15/21 0032 03/15/21 0450 03/15/21 0717 03/15/21 1149  GLUCAP 108* 147* 90 127* 164*    35 min spent with >50% in counseling and coordination of care.   Lynnell Catalan, MD [facility] ICU Physician [facility] [place] [facility]  Pager: [phone] Or Epic Secure Chat After hours: [phone].  03/15/2021, 2:47 PM

## 2021-03-16 ENCOUNTER — Inpatient Hospital Stay (HOSPITAL_COMMUNITY): Payer: Self-pay

## 2021-03-16 ENCOUNTER — Encounter (HOSPITAL_COMMUNITY): Payer: Self-pay | Admitting: Pulmonary Disease

## 2021-03-16 ENCOUNTER — Other Ambulatory Visit (HOSPITAL_COMMUNITY): Payer: Self-pay

## 2021-03-16 DIAGNOSIS — F17200 Nicotine dependence, unspecified, uncomplicated: Secondary | ICD-10-CM

## 2021-03-16 DIAGNOSIS — I255 Ischemic cardiomyopathy: Secondary | ICD-10-CM

## 2021-03-16 DIAGNOSIS — I2109 ST elevation (STEMI) myocardial infarction involving other coronary artery of anterior wall: Secondary | ICD-10-CM

## 2021-03-16 HISTORY — DX: Nicotine dependence, unspecified, uncomplicated: F17.200

## 2021-03-16 LAB — BASIC METABOLIC PANEL
Anion gap: 8 (ref 5–15)
BUN: 22 mg/dL (ref 8–23)
CO2: 25 mmol/L (ref 22–32)
Calcium: 8.4 mg/dL — ABNORMAL LOW (ref 8.9–10.3)
Chloride: 103 mmol/L (ref 98–111)
Creatinine, Ser: 1.7 mg/dL — ABNORMAL HIGH (ref 0.61–1.24)
GFR, Estimated: 45 mL/min — ABNORMAL LOW (ref >60)
Glucose, Bld: 122 mg/dL — ABNORMAL HIGH (ref 70–99)
Potassium: 3.1 mmol/L — ABNORMAL LOW (ref 3.5–5.1)
Sodium: 136 mmol/L (ref 135–145)

## 2021-03-16 LAB — CBC
HCT: 37.3 % — ABNORMAL LOW (ref 39.0–52.0)
Hemoglobin: 13 g/dL (ref 13.0–17.0)
MCH: 33 pg (ref 26.0–34.0)
MCHC: 34.9 g/dL (ref 30.0–36.0)
MCV: 94.7 fL (ref 80.0–100.0)
Platelets: 153 10*3/uL (ref 150–400)
RBC: 3.94 MIL/uL — ABNORMAL LOW (ref 4.22–5.81)
RDW: 13.1 % (ref 11.5–15.5)
WBC: 13.6 10*3/uL — ABNORMAL HIGH (ref 4.0–10.5)
nRBC: 0 % (ref 0.0–0.2)

## 2021-03-16 LAB — HEPATIC FUNCTION PANEL
ALT: 112 U/L — ABNORMAL HIGH (ref 0–44)
AST: 146 U/L — ABNORMAL HIGH (ref 15–41)
Albumin: 2.5 g/dL — ABNORMAL LOW (ref 3.5–5.0)
Alkaline Phosphatase: 47 U/L (ref 38–126)
Bilirubin, Direct: 0.2 mg/dL (ref 0.0–0.2)
Indirect Bilirubin: 1.2 mg/dL — ABNORMAL HIGH (ref 0.3–0.9)
Total Bilirubin: 1.4 mg/dL — ABNORMAL HIGH (ref 0.3–1.2)
Total Protein: 6.2 g/dL — ABNORMAL LOW (ref 6.5–8.1)

## 2021-03-16 LAB — ECHOCARDIOGRAM LIMITED
Calc EF: 39.8 %
Height: 69 in
Single Plane A2C EF: 48.2 %
Single Plane A4C EF: 27.1 %
Weight: 2851.2 oz

## 2021-03-16 LAB — COOXEMETRY PANEL
Carboxyhemoglobin: 1.3 % (ref 0.5–1.5)
Methemoglobin: 1.2 % (ref 0.0–1.5)
O2 Saturation: 66.8 %
Total hemoglobin: 13.4 g/dL (ref 12.0–16.0)

## 2021-03-16 LAB — GLUCOSE, CAPILLARY
Glucose-Capillary: 113 mg/dL — ABNORMAL HIGH (ref 70–99)
Glucose-Capillary: 133 mg/dL — ABNORMAL HIGH (ref 70–99)
Glucose-Capillary: 147 mg/dL — ABNORMAL HIGH (ref 70–99)
Glucose-Capillary: 158 mg/dL — ABNORMAL HIGH (ref 70–99)
Glucose-Capillary: 98 mg/dL (ref 70–99)

## 2021-03-16 MED ORDER — SPIRONOLACTONE 25 MG PO TABS
25.0000 mg | ORAL_TABLET | Freq: Every day | ORAL | Status: DC
  Administered 2021-03-16 – 2021-03-19 (×4): 25 mg via ORAL
  Filled 2021-03-16 (×4): qty 1

## 2021-03-16 MED ORDER — POTASSIUM CHLORIDE CRYS ER 20 MEQ PO TBCR
40.0000 meq | EXTENDED_RELEASE_TABLET | Freq: Once | ORAL | Status: AC
  Administered 2021-03-16: 40 meq via ORAL
  Filled 2021-03-16: qty 2

## 2021-03-16 NOTE — Evaluation (Signed)
Physical Therapy Evaluation Patient Details Name: Logan Brewer MRN: [id number] DOB: [birth year] Today's Date: 03/16/2021  History of Present Illness  65 yo male presenting 9/21 with chest pain, became unresponsive in route resulting in being down 10 min prior to initiation of CPR by EMS on arrival. Pt intubated on arrival, and continued to have recurrent VF/torsades arrests in ER, each requiring defibrillation. Admitted with VT/VF arrest secondary to acute anterior ST elevation MI with subsequent respiratory failure and cardiogenic shock  Extubated 9/22. history of uncontrolled HTN and heavy, longstanding tobacco use.  Clinical Impression  PTA pt living with wife in single story home with 3 steps to enter. Pt reports independence with mobility, ADLs and iADLs. Pt is limited in safe mobility by decreased dynamic balance and generalized weakness. Pt is currently mod I for transfers and generally supervision for ambulation without AD, however does experience 1x LoB requiring min A for steadying. Pt able to ambulate on RA with SaO2 98%O2. Pt will not have any PT or equipment needs at discharge, however PT will continue to follow pt acutely to work on dynamic balance.      Recommendations for follow up therapy are one component of a multi-disciplinary discharge planning process, led by the attending physician.  Recommendations may be updated based on patient status, additional functional criteria and insurance authorization.  Follow Up Recommendations No PT follow up;Supervision for mobility/OOB    Equipment Recommendations  None recommended by PT       Precautions / Restrictions Precautions Precautions: None Restrictions Weight Bearing Restrictions: No      Mobility  Bed Mobility               General bed mobility comments: OOB in recliner on entry    Transfers Overall transfer level: Modified independent               General transfer comment: increased time and effort,  however able to self steady  Ambulation/Gait Ambulation/Gait assistance: Min assist;Supervision Gait Distance (Feet): 120 Feet Assistive device: IV Pole;None Gait Pattern/deviations: Step-through pattern;Decreased step length - right;Decreased step length - left;Drifts right/left;Narrow base of support Gait velocity: slowed Gait velocity interpretation: 1.31 - 2.62 ft/sec, indicative of limited community ambulator General Gait Details: Pt started to door with supervision level assist, with stopping to open door pt has 1x LoB requiring min A for steadying. Pt pushed IV pole for 80 feet, last 40 feet able to ambulate without AD and supervision level of assist        Balance Overall balance assessment: Mild deficits observed, not formally tested (dynamic LoB)                                           Pertinent Vitals/Pain Pain Assessment: No/denies pain    Home Living Family/patient expects to be discharged to:: Private residence Living Arrangements: Spouse/significant other Available Help at Discharge: Family;Available 24 hours/day Type of Home: House Home Access: Stairs to enter Entrance Stairs-Rails: None Entrance Stairs-Number of Steps: 3 Home Layout: One level Home Equipment: None      Prior Function Level of Independence: Independent         Comments: drives, was working        Extremity/Trunk Assessment   Upper Extremity Assessment Upper Extremity Assessment: Overall WFL for tasks assessed    Lower Extremity Assessment Lower Extremity Assessment: Overall WFL for tasks assessed  Communication   Communication: No difficulties  Cognition Arousal/Alertness: Awake/alert Behavior During Therapy: WFL for tasks assessed/performed Overall Cognitive Status: Within Functional Limits for tasks assessed                                        General Comments General comments (skin integrity, edema, etc.): At rest on RA SaO2  100%O2, HR 84, with ambulation SaO2 on RA 98%, HR 98 bpm        Assessment/Plan    PT Assessment Patient needs continued PT services  PT Problem List Decreased activity tolerance;Decreased balance       PT Treatment Interventions DME instruction;Gait training;Stair training;Functional mobility training;Therapeutic activities;Therapeutic exercise;Balance training;Cognitive remediation;Patient/family education    PT Goals (Current goals can be found in the Care Plan section)  Acute Rehab PT Goals Patient Stated Goal: go home PT Goal Formulation: With patient Time For Goal Achievement: 03/30/21 Potential to Achieve Goals: Fair    Frequency Min 3X/week    AM-PAC PT "6 Clicks" Mobility  Outcome Measure Help needed turning from your back to your side while in a flat bed without using bedrails?: None Help needed moving from lying on your back to sitting on the side of a flat bed without using bedrails?: None Help needed moving to and from a bed to a chair (including a wheelchair)?: None Help needed standing up from a chair using your arms (e.g., wheelchair or bedside chair)?: None Help needed to walk in hospital room?: None Help needed climbing 3-5 steps with a railing? : A Little 6 Click Score: 23    End of Session Equipment Utilized During Treatment: Gait belt Activity Tolerance: Patient tolerated treatment well Patient left: in chair;with call bell/phone within reach;with family/visitor present Nurse Communication: Mobility status;Other (comment) (ambulates on RA) PT Visit Diagnosis: Unsteadiness on feet (R26.81);Muscle weakness (generalized) (M62.81)    Time: 2197-5883 PT Time Calculation (min) (ACUTE ONLY): 18 min   Charges:   PT Evaluation $PT Eval Moderate Complexity: 1 Mod          Shanai Lartigue B. Beverely Risen PT, DPT Acute Rehabilitation Services Pager [phone] Office [phone]   Elon Alas Fleet 03/16/2021, 3:52 PM

## 2021-03-16 NOTE — Progress Notes (Signed)
  Echocardiogram 2D Echocardiogram has been performed.  Logan Brewer 03/16/2021, 5:23 PM

## 2021-03-16 NOTE — Progress Notes (Signed)
[facility] PROGRESS NOTE    Progress Note  Logan Brewer  EXN:[id number] DOB: 1955/08/10 DOA: 03/11/2021 PCP: Grayce Sessions, NP     Brief Narrative:   Logan Brewer is an 65 y.o. male past medical history of uncontrolled hypertension, heavy longtime smoker comes in to the ED for status post PPM arrest.  As reading from the chart he started having chest pain around 4:30 PM on the day of admission with diaphoresis while in route by EMS he coded, ROSC was achieved after 4 defibrillated attempts, he was intubated and KG showed ST segment elevation started on amiodarone, aspirin, heparin, he arrested again which required to defibrillation's, so he was started on lidocaine initially admitted to the ICU 2D echo showed an EF of 25% he was taken to the cardiac Cath Lab after cardiology consultation and he status post DES to the LAD    Significant Events: 9/22 LHC: Ost LAD to Prox LAD 99% stenosed, mid LA 30% stenosed  DES placed with 0% residual stenosis  Moderate pulm hypertension EF 25% with anterior akinesis on echo immediately following PCI  9/22 started on milrinone.    Significant studies: 9/23 complaining of abdominal pain - CT abdomen showing possible ischemic sigmoiditis. Lactate down-trending.  Started on Zosyn for ischemic colitis.  Antibiotics: None  Microbiology data: Blood culture:  Procedures: None  Assessment/Plan:   Acute cardiogenic shock due to STEMI/acute systolic heart failure: Status postcardiac cath with DES stent placement to the LAD.  Initial echo showed an EF of 20% with akinesia, material wall pain index He was titrated off milrinone on 03/16/2019. He is currently on Entresto Aldactone digoxin and Coreg He is also going back to statin Cardiology recommended repeat 2D echo.  Acute respiratory failure with hypoxia: Secondary to cardiogenic shock in the setting of anterior MI. PCCM consulted intubated eventually extubated on 03/12/2021.  CAD  with an anterior STEMI: S/p cardiac cath with a DES to the LAD, on DAPT therapy. He was also started on statins.  Denies chest pain  VT/VF: Initially treated with defibrillation and amiodarone.  Ischemic bowel: Likely due to cardiogenic shock confirmed on CT. He is currently on IV Zosyn  Acute kidney injury/possibly ATN: Likely due to shock, uncertain of baseline on admission was 1.5, improve with conservative management. Now 1.7.  Shock liver: Slowly improving.  Electrolyte imbalance/Hypokalemia: Continue replete try to keep above 4. Magnesium  2.  Leukocytosis/fever: Likely due to MI and ischemic bowel. Will continue 5-day course  of IV Zosyn. Leukocytosis improving.  And he has remained afebrile since 03/12/2021.  New hemoptysis: Very minimal not hypoxic, streaks in the sputum. He was on heparin now on aspirin and Plavix recently intubated.   DVT prophylaxis: lovenox Family Communication:none Status is: Inpatient  Remains inpatient appropriate because:Hemodynamically unstable  Dispo: The patient is from: Home              Anticipated d/c is to: Home              Patient currently is not medically stable to d/c.   Difficult to place patient No        Code Status:     Code Status Orders  (From admission, onward)           Start     Ordered   03/12/21 0018  Full code  Continuous        03/12/21 0022           Code Status History  This patient has a current code status but no historical code status.         IV Access:   Peripheral IV   Procedures and diagnostic studies:   [place] EKG SITE RITE  Result Date: 03/14/2021 If Site Rite image not attached, placement could not be confirmed due to current cardiac rhythm.    Medical Consultants:   None.   Subjective:    Logan Brewer denies any chest pain or shortness of breath some hemoptysis this morning streaks of blood in sputum  Objective:    Vitals:   03/15/21 1927  03/15/21 2003 03/16/21 0413 03/16/21 0757  BP:  116/70 135/73 124/83  Pulse:  80 92 72  Resp:  18 18 16   Temp:  98.5 F (36.9 C) 98.2 F (36.8 C) 98.5 F (36.9 C)  TempSrc:  Oral Oral Oral  SpO2:  96% 97%   Weight: 80.8 kg     Height: 5\' 9"  (1.753 m)      SpO2: 97 % O2 Flow Rate (L/min): 2 L/min FiO2 (%): (S) 40 % (per sats)   Intake/Output Summary (Last 24 hours) at 03/16/2021 0842 Last data filed at 03/16/2021 0410 Gross per 24 hour  Intake 352.82 ml  Output 400 ml  Net -47.18 ml   Filed Weights   03/14/21 0500 03/15/21 0432 03/15/21 1927  Weight: 89.5 kg 81.1 kg 80.8 kg    Exam: General exam: In no acute distress. Respiratory system: Good air movement lungs are clear Cardiovascular system: S1 & S2 heard, RRR. No JVD. Gastrointestinal system: Abdomen is nondistended, soft and nontender.  Central nervous system: Alert and oriented x3 and coherent. Extremities: No pedal edema. Skin: No rashes, lesions or ulcers Psychiatry: Judgement and insight appear normal. Mood & affect appropriate.    Data Reviewed:    Labs: Basic Metabolic Panel: Recent Labs  Lab 03/12/21 0200 03/12/21 0512 03/12/21 1118 03/13/21 0325 03/13/21 1445 03/14/21 0306 03/15/21 0450 03/15/21 0838 03/16/21 0505  NA  --  136   < > 134* 136 136  --  133* 136  K  --  3.4*   < > 3.8 3.8 3.7  --  3.4* 3.1*  CL  --  99   < > 99 103 105  --  101 103  CO2  --  18*   < > 26 25 24   --  24 25  GLUCOSE  --  315*   < > 138* 132* 110*  --  175* 122*  BUN  --  19   < > 21 18 15   --  20 22  CREATININE 1.79* 2.20*   < > 2.06* 1.84* 1.84*  --  1.68* 1.70*  CALCIUM  --  7.3*   < > 7.7* 8.0* 8.0*  --  8.1* 8.4*  MG 2.7* 3.2*  --  2.3  --  1.9 2.0  --   --    < > = values in this interval not displayed.   GFR Estimated Creatinine Clearance: 44.5 mL/min (A) (by C-G formula based on SCr of 1.7 mg/dL (H)). Liver Function Tests: Recent Labs  Lab 03/11/21 2245 03/13/21 0325 03/16/21 0505  AST 481* 598*  146*  ALT 418* 340* 112*  ALKPHOS 56 63 47  BILITOT 0.8 0.7 1.4*  PROT 5.2* 5.7* 6.2*  ALBUMIN 2.6* 2.6* 2.5*   No results for input(s): LIPASE, AMYLASE in the last 168 hours. No results for input(s): AMMONIA in the last 168 hours. Coagulation profile Recent Labs  Lab 03/11/21 2245  INR 1.5*   COVID-19 Labs  No results for input(s): DDIMER, FERRITIN, LDH, CRP in the last 72 hours.  Lab Results  Component Value Date   SARSCOV2NAA NEGATIVE 03/11/2021    CBC: Recent Labs  Lab 03/11/21 2245 03/11/21 2300 03/12/21 1944 03/13/21 0325 03/14/21 0306 03/15/21 0450 03/16/21 0505  WBC 9.6   < > 27.8* 26.8* 20.5* 17.9* 13.6*  NEUTROABS 3.4  --   --   --   --   --   --   HGB 13.9   < > 14.7 14.0 12.3* 13.5 13.0  HCT 44.7   < > 42.5 39.9 36.6* 39.4 37.3*  MCV 104.7*   < > 94.0 94.5 97.1 95.6 94.7  PLT 168   < > 235 186 145* 137* 153   < > = values in this interval not displayed.   Cardiac Enzymes: No results for input(s): CKTOTAL, CKMB, CKMBINDEX, TROPONINI in the last 168 hours. BNP (last 3 results) No results for input(s): PROBNP in the last 8760 hours. CBG: Recent Labs  Lab 03/15/21 1538 03/15/21 2006 03/16/21 0009 03/16/21 0406 03/16/21 0755  GLUCAP 122* 160* 147* 133* 113*   D-Dimer: No results for input(s): DDIMER in the last 72 hours. Hgb A1c: No results for input(s): HGBA1C in the last 72 hours. Lipid Profile: No results for input(s): CHOL, HDL, LDLCALC, TRIG, CHOLHDL, LDLDIRECT in the last 72 hours. Thyroid function studies: No results for input(s): TSH, T4TOTAL, T3FREE, THYROIDAB in the last 72 hours.  Invalid input(s): FREET3 Anemia work up: No results for input(s): VITAMINB12, FOLATE, FERRITIN, TIBC, IRON, RETICCTPCT in the last 72 hours. Sepsis Labs: Recent Labs  Lab 03/13/21 0325 03/13/21 1432 03/13/21 2032 03/14/21 0306 03/15/21 0450 03/16/21 0505  WBC 26.8*  --   --  20.5* 17.9* 13.6*  LATICACIDVEN 1.6 1.5 1.4 0.8  --   --     Microbiology Recent Results (from the past 240 hour(s))  Resp Panel by RT-PCR (Flu A&B, Covid) Nasopharyngeal Swab     Status: None   Collection Time: 03/11/21 10:49 PM   Specimen: Nasopharyngeal Swab; Nasopharyngeal(NP) swabs in vial transport medium  Result Value Ref Range Status   SARS Coronavirus 2 by RT PCR NEGATIVE NEGATIVE Final    Comment: (NOTE) SARS-CoV-2 target nucleic acids are NOT DETECTED.  The SARS-CoV-2 RNA is generally detectable in upper respiratory specimens during the acute phase of infection. The lowest concentration of SARS-CoV-2 viral copies this assay can detect is 138 copies/mL. A negative result does not preclude SARS-Cov-2 infection and should not be used as the sole basis for treatment or other patient management decisions. A negative result may occur with  improper specimen collection/handling, submission of specimen other than nasopharyngeal swab, presence of viral mutation(s) within the areas targeted by this assay, and inadequate number of viral copies(<138 copies/mL). A negative result must be combined with clinical observations, patient history, and epidemiological information. The expected result is Negative.  Fact Sheet for Patients:  BloggerCourse.com  Fact Sheet for Healthcare Providers:  SeriousBroker.it  This test is no t yet approved or cleared by the [place] FDA and  has been authorized for detection and/or diagnosis of SARS-CoV-2 by FDA under an Emergency Use Authorization (EUA). This EUA will remain  in effect (meaning this test can be used) for the duration of the COVID-19 declaration under Section 564(b)(1) of the Act, 21 U.S.C.section 360bbb-3(b)(1), unless the authorization is terminated  or revoked sooner.  Influenza A by PCR NEGATIVE NEGATIVE Final   Influenza B by PCR NEGATIVE NEGATIVE Final    Comment: (NOTE) The Xpert Xpress SARS-CoV-2/FLU/RSV plus assay is  intended as an aid in the diagnosis of influenza from Nasopharyngeal swab specimens and should not be used as a sole basis for treatment. Nasal washings and aspirates are unacceptable for Xpert Xpress SARS-CoV-2/FLU/RSV testing.  Fact Sheet for Patients: BloggerCourse.com  Fact Sheet for Healthcare Providers: SeriousBroker.it  This test is not yet approved or cleared by the [place] FDA and has been authorized for detection and/or diagnosis of SARS-CoV-2 by FDA under an Emergency Use Authorization (EUA). This EUA will remain in effect (meaning this test can be used) for the duration of the COVID-19 declaration under Section 564(b)(1) of the Act, 21 U.S.C. section 360bbb-3(b)(1), unless the authorization is terminated or revoked.  Performed at [facility], [address] [place], [place] [address]   Culture, Respiratory w Gram Stain (tracheal aspirate)     Status: None   Collection Time: 03/12/21  2:09 AM   Specimen: Tracheal Aspirate; Respiratory  Result Value Ref Range Status   Specimen Description TRACHEAL ASPIRATE  Final   Special Requests NONE  Final   Gram Stain   Final    FEW SQUAMOUS EPITHELIAL CELLS PRESENT FEW WBC SEEN FEW GRAM POSITIVE COCCI    Culture   Final    FEW Normal respiratory flora-no Staph aureus or Pseudomonas seen Performed at [facility], [address] [place], [place] [address]    Report Status 03/14/2021 FINAL  Final  MRSA Next Gen by PCR, Nasal     Status: None   Collection Time: 03/12/21  2:09 AM   Specimen: Nasal Mucosa; Nasal Swab  Result Value Ref Range Status   MRSA by PCR Next Gen NOT DETECTED NOT DETECTED Final    Comment: (NOTE) The GeneXpert MRSA Assay (FDA approved for NASAL specimens only), is one component of a comprehensive MRSA colonization surveillance program. It is not intended to diagnose MRSA infection nor to guide or monitor treatment for MRSA  infections. Test performance is not FDA approved in patients less than 31 years old. Performed at [facility], [address], [place], [place] [address]      Medications:    atorvastatin  80 mg Oral Daily   carvedilol  6.25 mg Oral BID WC   Chlorhexidine Gluconate Cloth  6 each Topical Daily   dapagliflozin propanediol  10 mg Oral Daily   digoxin  0.125 mg Oral Daily   enoxaparin (LOVENOX) injection  40 mg Subcutaneous Q24H   insulin aspart  0-15 Units Subcutaneous Q4H   sacubitril-valsartan  1 tablet Oral BID   sodium chloride flush  10-40 mL Intracatheter Q12H   spironolactone  12.5 mg Oral Daily   ticagrelor  90 mg Oral BID   Continuous Infusions:    LOS: 4 days   Logan Brewer  [facility]  03/16/2021, 8:42 AM

## 2021-03-16 NOTE — Progress Notes (Addendum)
HF CSW attempted to visit Logan Brewer at bedside to complete an SDOH and he reported that he just got done working with PT and is just now eating lunch and for the CSW to come back at another time. CSW will make another attempt again at a later time.  Numan Zylstra, MSW, LCSWA [phone] Heart Failure Social Worker

## 2021-03-16 NOTE — Progress Notes (Signed)
CARDIAC REHAB PHASE I   Went to offer to walk with pt. Pt c/o CP, and hunger. Diet order placed and food ordered. Began MI/stent education. Pt with frequent phone calls and visitors. Will return later as time allows to continue.  [id number] Reynold Bowen, RN BSN 03/16/2021 11:26 AM

## 2021-03-16 NOTE — Progress Notes (Addendum)
Advanced Heart Failure Rounding Note  PCP-Cardiologist: None    Patient Profile   Mr. Logan Brewer is a 65 year old male admitted with VT/VF arrest secondary to acute anterior ST elevation MI with subsequent respiratory failure and cardiogenic shock   Subjective:    Developed ischemic bowel 9/23. Confirmed with CT.   Complaining of cough. Productive cough overnight with hemoptysis. Denies chest pain. Denies abdominal pain.   Objective:   Weight Range: 80.8 kg Body mass index is 26.32 kg/m.   Vital Signs:   Temp:  [98.2 F (36.8 C)-99.3 F (37.4 C)] 98.5 F (36.9 C) (09/26 0757) Pulse Rate:  [72-103] 72 (09/26 0757) Resp:  [16-33] 16 (09/26 0757) BP: (105-139)/(57-83) 124/83 (09/26 0757) SpO2:  [91 %-98 %] 97 % (09/26 0413) Weight:  [80.8 kg] 80.8 kg (09/25 1927) Last BM Date: 03/15/21  Weight change: Filed Weights   03/14/21 0500 03/15/21 0432 03/15/21 1927  Weight: 89.5 kg 81.1 kg 80.8 kg    Intake/Output:   Intake/Output Summary (Last 24 hours) at 03/16/2021 0846 Last data filed at 03/16/2021 0410 Gross per 24 hour  Intake 352.82 ml  Output 400 ml  Net -47.18 ml    Physical Exam  General:   No resp difficulty HEENT: normal Neck: supple. no JVD. Carotids 2+ bilat; no bruits. No lymphadenopathy or thryomegaly appreciated. Cor: PMI nondisplaced. Regular rate & rhythm. No rubs, gallops or murmurs. Lungs: decreased in the bases Abdomen: soft, nontender, nondistended. No hepatosplenomegaly. No bruits or masses. Good bowel sounds. Extremities: no cyanosis, clubbing, rash, edema. RUE PICC Neuro: alert & orientedx3, cranial nerves grossly intact. moves all 4 extremities w/o difficulty. Affect pleasant  Telemetry   SR 70-80s personally reviewed.   Labs    CBC Recent Labs    03/15/21 0450 03/16/21 0505  WBC 17.9* 13.6*  HGB 13.5 13.0  HCT 39.4 37.3*  MCV 95.6 94.7  PLT 137* 153   Basic Metabolic Panel Recent Labs    54/00/86 0306 03/15/21 0450  03/15/21 0838 03/16/21 0505  NA 136  --  133* 136  K 3.7  --  3.4* 3.1*  CL 105  --  101 103  CO2 24  --  24 25  GLUCOSE 110*  --  175* 122*  BUN 15  --  20 22  CREATININE 1.84*  --  1.68* 1.70*  CALCIUM 8.0*  --  8.1* 8.4*  MG 1.9 2.0  --   --    Liver Function Tests Recent Labs    03/16/21 0505  AST 146*  ALT 112*  ALKPHOS 47  BILITOT 1.4*  PROT 6.2*  ALBUMIN 2.5*   No results for input(s): LIPASE, AMYLASE in the last 72 hours. Cardiac Enzymes No results for input(s): CKTOTAL, CKMB, CKMBINDEX, TROPONINI in the last 72 hours.  BNP: BNP (last 3 results) No results for input(s): BNP in the last 8760 hours.  ProBNP (last 3 results) No results for input(s): PROBNP in the last 8760 hours.   D-Dimer No results for input(s): DDIMER in the last 72 hours. Hemoglobin A1C No results for input(s): HGBA1C in the last 72 hours.  Fasting Lipid Panel No results for input(s): CHOL, HDL, LDLCALC, TRIG, CHOLHDL, LDLDIRECT in the last 72 hours.  Thyroid Function Tests No results for input(s): TSH, T4TOTAL, T3FREE, THYROIDAB in the last 72 hours.  Invalid input(s): FREET3  Other results:   Imaging    No results found.   Medications:     Scheduled Medications:  atorvastatin  80 mg Oral Daily   carvedilol  6.25 mg Oral BID WC   Chlorhexidine Gluconate Cloth  6 each Topical Daily   dapagliflozin propanediol  10 mg Oral Daily   digoxin  0.125 mg Oral Daily   enoxaparin (LOVENOX) injection  40 mg Subcutaneous Q24H   insulin aspart  0-15 Units Subcutaneous Q4H   sacubitril-valsartan  1 tablet Oral BID   sodium chloride flush  10-40 mL Intracatheter Q12H   spironolactone  12.5 mg Oral Daily   ticagrelor  90 mg Oral BID    Infusions:    PRN Medications: acetaminophen, hydrALAZINE, ondansetron (ZOFRAN) IV, polyethylene glycol, simethicone, sodium chloride flush, traMADol   Assessment/Plan   1. Cardiogenic shock (acute systolic HF) -Secondary to anterior ST  elevation MI/OOH arrest -EF 20-25% on echo with akinesis of anteroseptal wall and apex - Bedside echo EF 35-40% 03/14/21. Limited ECHO pending. -  - Check CO-OX  - Volume status stable. Does not need lasix.  - Continue entresto 49/51 bid - Continue spiro 12.5 mg daily - Continue digoxin 0.125 mg daily--may be able to stop with based on ECHO - Continue carvedilol 6.25 bid - Continue dapaglifozin 10 mg daily.  -- Repeat limited echo  ordered - Renal function stable.     2. CAD/Anterior ST elevation MI: - s/p PCI/DES to LAD as above - DAPT with aspirin and ticagrelor - No chest pain - Continue statin.  - CR seeing   3. VT/VF arrest: -D/t #2 - stable can stop amio   4. Ischemic bowel - confirmed on CT. No perf - clinically improved   5. AKI: -Scr 1.7 > 2.68 > 2.06 > 1.8 > 1.7>1.7  -D/t ATN/shock.  -Baseline not certain. - Follow daily BMET    6. Elevated LFTs: -Due to shock liver -Trending down   6. Acute respiratory failure: -Secondary to AMI and shock -Extubated 9/22   7. Hypokalemia: -K 3.1- Supp K  - Increase spiro 25 mg daily.  -Mag 2.0   8. Tobacco use: - understands need for cessation  9. Leukocytosis/fever: - likely due to MI and ischemic bowel - WBC trending down.  - continue zosyn for 5 day course  10. Hemoptysis Discussed with Dr Robb Matar.  Hgb stable. Follow daily CBC   Length of Stay: 4  Amy Clegg, NP  03/16/2021, 8:46 AM  Advanced Heart Failure Team Pager [phone] (M-F; 7a - 5p)  Please contact [facility] for night-coverage after hours (5p -7a ) and weekends on [email or web address]   Patient seen and examined with the above-signed Advanced Practice Provider and/or Housestaff. I personally reviewed laboratory data, imaging studies and relevant notes. I independently examined the patient and formulated the important aspects of the plan. I have edited the note to reflect any of my changes or salient points. I have personally discussed the plan with the  patient and/or family.  Overnight with cough and some hemoptysis. Hgb 13.5 -> 13.0. Denies CP. SOB, orthopnea or PND   General:  Sitting up No resp difficulty HEENT: normal Neck: supple. JVP 6-7 Carotids 2+ bilat; no bruits. No lymphadenopathy or thryomegaly appreciated. Cor: PMI nondisplaced. Regular rate & rhythm. No rubs, gallops or murmurs. Lungs: clear Abdomen: soft, nontender, nondistended. No hepatosplenomegaly. No bruits or masses. Good bowel sounds. Extremities: no cyanosis, clubbing, rash, edema Neuro: alert & orientedx3, cranial nerves grossly intact. moves all 4 extremities w/o difficulty. Affect pleasant  Unclear if true hemoptysis or related to trauma from ETT. Will follow closely. Set-up CVP to  follow CPV. May need some lasix. Repeat echo today. Mobilize with CR. Rhtyhm stable. Supp K.   Arvilla Meres, MD  9:33 AM

## 2021-03-16 NOTE — TOC Initial Note (Addendum)
Transition of Care [facility]) - Initial/Assessment Note    Patient Details  Name: Logan Brewer MRN: [id number] Date of Birth: April 29, 1956  [facility]) CM/SW Contact:    Elliot Cousin, RN Phone Number: [phone] 03/16/2021, 2:40 PM  Clinical Narrative:                 HF TOC CM spoke to pt at bedside. Lives at home with significant other, Val. Jovita Gamma permission to speak to wife. States he gets meds [facility]. Discussed his new medications and need for patient assistance. Pt states he does not have insurance. He was working full-time and has not worked in 2 weeks. Pt wanted to apply for medicaid, food stamps and disability.   PT will try to walk patient to see oxygen is needed for home.   Expected Discharge Plan: Home w Home Health Services Barriers to Discharge: Continued Medical Work up   Patient Goals and CMS Choice Patient states their goals for this hospitalization and ongoing recovery are:: wants to remain independent      Expected Discharge Plan and Services Expected Discharge Plan: Home w Home Health Services In-house Referral: Clinical Social Work Discharge Planning Services: CM Consult, Medication Assistance   Living arrangements for the past 2 months: Single Family Home                                      Prior Living Arrangements/Services Living arrangements for the past 2 months: Single Family Home Lives with:: Spouse Patient language and need for interpreter reviewed:: Yes Do you feel safe going back to the place where you live?: Yes      Need for Family Participation in Patient Care: No (Comment) Care giver support system in place?: No (comment)   Criminal Activity/Legal Involvement Pertinent to Current Situation/Hospitalization: No - Comment as needed  Activities of Daily Living Home Assistive Devices/Equipment: None ADL Screening (condition at time of admission) Patient's cognitive ability adequate to safely complete daily  activities?: Yes Is the patient deaf or have difficulty hearing?: No Does the patient have difficulty seeing, even when wearing glasses/contacts?: No Does the patient have difficulty concentrating, remembering, or making decisions?: No Patient able to express need for assistance with ADLs?: Yes Does the patient have difficulty dressing or bathing?: No Independently performs ADLs?: Yes (appropriate for developmental age) Does the patient have difficulty walking or climbing stairs?: No Weakness of Legs: None Weakness of Arms/Hands: None  Permission Sought/Granted Permission sought to share information with : Case Manager, PCP, Family Supports Permission granted to share information with : Yes, Verbal Permission Granted  Share Information with NAME: Consuello Closs     Permission granted to share info w Relationship: wife  Permission granted to share info w Contact Information: [phone]  Emotional Assessment Appearance:: Appears stated age Attitude/Demeanor/Rapport: Engaged Affect (typically observed): Accepting Orientation: : Oriented to Self, Oriented to Place, Oriented to  Time, Oriented to Situation   Psych Involvement: No (comment)  Admission diagnosis:  Cardiopulmonary arrest (HCC) [I46.9] Metabolic acidosis [E87.2] ST elevation myocardial infarction (STEMI), unspecified artery (HCC) [I21.3] Cardiac arrest [facility]) [I46.9] Patient Active Problem List   Diagnosis Date Noted   Cardiac arrest (HCC) 03/12/2021   Atrial fibrillation (HCC) 03/12/2021   STEMI (ST elevation myocardial infarction) (HCC) 03/12/2021   AKI (acute kidney injury) (HCC) 03/12/2021   Shock liver 03/12/2021   Endotracheally intubated 03/12/2021   Acute  pulmonary edema (HCC) 03/12/2021   Anoxic encephalopathy (HCC) 03/12/2021   Acute ST elevation myocardial infarction (STEMI) of anterior wall (HCC)    Metabolic acidosis    Coronary artery disease involving native coronary artery of native heart with unstable  angina pectoris (HCC)    PCP:  Grayce Sessions, NP Pharmacy:   [phone] [place], [place] - [address] [place] [address] Phone: [phone] Fax: [phone]     Social Determinants of Health (SDOH) Interventions    Readmission Risk Interventions No flowsheet data found.

## 2021-03-17 ENCOUNTER — Encounter (HOSPITAL_COMMUNITY): Payer: Self-pay | Admitting: Pulmonary Disease

## 2021-03-17 LAB — COOXEMETRY PANEL
Carboxyhemoglobin: 1.3 % (ref 0.5–1.5)
Methemoglobin: 1.2 % (ref 0.0–1.5)
O2 Saturation: 66.7 %
Total hemoglobin: 12.8 g/dL (ref 12.0–16.0)

## 2021-03-17 LAB — GLUCOSE, CAPILLARY
Glucose-Capillary: 81 mg/dL (ref 70–99)
Glucose-Capillary: 92 mg/dL (ref 70–99)
Glucose-Capillary: 94 mg/dL (ref 70–99)
Glucose-Capillary: 114 mg/dL — ABNORMAL HIGH (ref 70–99)
Glucose-Capillary: 113 mg/dL — ABNORMAL HIGH (ref 70–99)
Glucose-Capillary: 193 mg/dL — ABNORMAL HIGH (ref 70–99)
Glucose-Capillary: 152 mg/dL — ABNORMAL HIGH (ref 70–99)

## 2021-03-17 LAB — CBC
HCT: 35.2 % — ABNORMAL LOW (ref 39.0–52.0)
Hemoglobin: 12 g/dL — ABNORMAL LOW (ref 13.0–17.0)
MCH: 32.5 pg (ref 26.0–34.0)
MCHC: 34.1 g/dL (ref 30.0–36.0)
MCV: 95.4 fL (ref 80.0–100.0)
Platelets: 184 10*3/uL (ref 150–400)
RBC: 3.69 MIL/uL — ABNORMAL LOW (ref 4.22–5.81)
RDW: 13.1 % (ref 11.5–15.5)
WBC: 13 10*3/uL — ABNORMAL HIGH (ref 4.0–10.5)
nRBC: 0 % (ref 0.0–0.2)

## 2021-03-17 LAB — BASIC METABOLIC PANEL
Anion gap: 8 (ref 5–15)
BUN: 20 mg/dL (ref 8–23)
CO2: 22 mmol/L (ref 22–32)
Calcium: 8.4 mg/dL — ABNORMAL LOW (ref 8.9–10.3)
Chloride: 104 mmol/L (ref 98–111)
Creatinine, Ser: 1.63 mg/dL — ABNORMAL HIGH (ref 0.61–1.24)
GFR, Estimated: 47 mL/min — ABNORMAL LOW (ref >60)
Glucose, Bld: 88 mg/dL (ref 70–99)
Potassium: 3.1 mmol/L — ABNORMAL LOW (ref 3.5–5.1)
Sodium: 134 mmol/L — ABNORMAL LOW (ref 135–145)

## 2021-03-17 LAB — MAGNESIUM: Magnesium: 1.9 mg/dL (ref 1.7–2.4)

## 2021-03-17 MED ORDER — ASPIRIN EC 81 MG PO TBEC
81.0000 mg | DELAYED_RELEASE_TABLET | Freq: Every day | ORAL | Status: DC
  Administered 2021-03-17 – 2021-03-19 (×3): 81 mg via ORAL
  Filled 2021-03-17 (×3): qty 1

## 2021-03-17 MED ORDER — SACUBITRIL-VALSARTAN 97-103 MG PO TABS
1.0000 | ORAL_TABLET | Freq: Two times a day (BID) | ORAL | Status: DC
  Administered 2021-03-17 – 2021-03-19 (×5): 1 via ORAL
  Filled 2021-03-17 (×6): qty 1

## 2021-03-17 MED ORDER — POTASSIUM CHLORIDE CRYS ER 20 MEQ PO TBCR
40.0000 meq | EXTENDED_RELEASE_TABLET | Freq: Two times a day (BID) | ORAL | Status: DC
  Administered 2021-03-17 – 2021-03-18 (×3): 40 meq via ORAL
  Filled 2021-03-17 (×3): qty 2

## 2021-03-17 NOTE — Progress Notes (Signed)
CARDIAC REHAB PHASE I   PRE:  Rate/Rhythm: 97 SR  BP:  Sitting: 103/92      SaO2: 94 RA  MODE:  Ambulation: 400 ft   POST:  Rate/Rhythm: 109 ST  BP:  Sitting: 160/80    SaO2: 96 RA   Pt ambulated 496ft in hallway independently with slow, steady gait. Pt denies CP, SOB, or dizziness throughout walk. Pt returned to bed. Pt educated on importance of ASA, Brilinta, and statin. Spoke with wife via phone and reinforced importance of med compliance and daily weights. Reviewed site care, restrictions, and exercise guidelines. Pt and wife deny further questions or concerns at this time. Pt requesting note for work, PA aware. Referred to [facility] [place]. Will continue to follow.  [id number] Reynold Bowen, RN BSN 03/17/2021 9:41 AM

## 2021-03-17 NOTE — TOC CM/SW Note (Signed)
HF TOC CM arranged appt with pt's PCP, Gwinda Passe NP on 04/20/2021 at 1010 am. Pt will receive his meds from [facility] pharmacy prior to dc and will be using HF fund for meds. Isidoro Donning RN3 CCM, Heart Failure TOC CM [phone]

## 2021-03-17 NOTE — TOC Progression Note (Signed)
Transition of Care [facility]) - Progression Note    Patient Details  Name: Logan Brewer MRN: [id number] Date of Birth: 05/26/1956  [facility]) CM/SW Contact  Izzabell Klasen, LCSWA Phone Number: 03/17/2021, 12:44 PM  Clinical Narrative:    HF CSW spoke patient at bedside and completed a very brief SDOH with the patient who reported disability and Food Stamps would be helpful as he isn't sure if he is allowed to go back to work. Patient reported they do have a PCP, Gwinda Passe and they can get to the pharmacy to pick up their medications. CSW obtained Mr. Natzke's signature for the disability application with the [facility]. Mr. Burnside reported that he has been working full-time and his bills will be paid this month but isn't sure what next month will look like if he cannot return to work right away. CSW provided Mr. Janee Morn with a Food Stamp application that he will need to fill out and bring to DSS of [facility] and to reach out if his water or electric gets turned off to the [facility] outpatient clinic [phone]. CSW provided the patient with the social workers name and position and if anything changes to please reach out so that the CSW can provide support.  CSW will continue to follow throughout discharge.   Expected Discharge Plan: Home w Home Health Services Barriers to Discharge: Continued Medical Work up  Expected Discharge Plan and Services Expected Discharge Plan: Home w Home Health Services In-house Referral: Clinical Social Work Discharge Planning Services: CM Consult, Medication Assistance   Living arrangements for the past 2 months: Single Family Home                                       Social Determinants of Health (SDOH) Interventions Food Insecurity Interventions: Other (Comment) (Provided Mr. Janee Morn with a Statistician to give to DSS) Financial Strain Interventions: Artist, Other (Comment) (CSW reached out  to [employer] to screen for Medicaid. Pt. reports concerns about not being able to work and paying his bills.) Housing Interventions: Other (Comment) (Pt. reports concerns about not being able to work and paying his rent/bills) Transportation Interventions: Intervention Not Indicated  Readmission Risk Interventions No flowsheet data found.  Reyli Schroth, MSW, LCSWA [phone] Heart Failure Social Worker

## 2021-03-17 NOTE — Progress Notes (Signed)
[facility] PROGRESS NOTE    Progress Note  Logan Brewer  CHE:[id number] DOB: 01/31/1956 DOA: 03/11/2021 PCP: Grayce Sessions, NP     Brief Narrative:   Logan Brewer is an 66 y.o. male past medical history of uncontrolled hypertension, heavy longtime smoker comes in to the ED for status post PPM arrest.  As reading from the chart he started having chest pain around 4:30 PM on the day of admission with diaphoresis while in route by EMS he coded, ROSC was achieved after 4 defibrillated attempts, he was intubated and KG showed ST segment elevation started on amiodarone, aspirin, heparin, he arrested again which required to defibrillation's, so he was started on lidocaine initially admitted to the ICU 2D echo showed an EF of 25% he was taken to the cardiac Cath Lab after cardiology consultation and he status post DES to the LAD    Significant Events: 9/22 LHC: Ost LAD to Prox LAD 99% stenosed, mid LA 30% stenosed  DES placed with 0% residual stenosis  Moderate pulm hypertension EF 25% with anterior akinesis on echo immediately following PCI  9/22 started on milrinone.  03/17/2019 repeat a 2D echo showed an EF of 40% with demonstrated regional wall motion abnormality, of the left ventricular mid apical anterior wall and apical segment.   Significant studies: 9/23 complaining of abdominal pain - CT abdomen showing possible ischemic sigmoiditis. Lactate down-trending.  Started on Zosyn for ischemic colitis.  Antibiotics: None  Microbiology data: Blood culture:  Procedures: None  Assessment/Plan:   Acute cardiogenic shock due to STEMI/acute systolic heart failure: Status postcardiac cath with DES stent placement to the LAD.  Initial echo showed an EF of 20% with akinesia, material wall pain index He was titrated off milrinone on 03/16/2019. He is currently on Entresto Aldactone digoxin and Coreg Continue statins. 2D echo showed an EF of 40% with wall motion  abnormality. Continue to monitor CVP, is currently elevated. Further management per the advanced heart failure team.  Acute respiratory failure with hypoxia: Secondary to cardiogenic shock in the setting of anterior MI. PCCM consulted intubated eventually extubated on 03/12/2021. 7 greater 90% on room air.  CAD with an anterior STEMI: S/p cardiac cath with a DES to the LAD, on DAPT therapy. He was also started on statins.  Denies chest pain  VT/VF: Initially treated with defibrillation and amiodarone.  Ischemic bowel: Likely due to cardiogenic shock confirmed on CT. Has completed his course in-house.  Acute kidney injury/possibly ATN: Likely due to shock, uncertain of baseline on admission was 1.5. Improved with treatment of shock now 1.6.  Shock liver: Slowly improving.  Electrolyte imbalance/Hypokalemia: Potassium this morning is low we will replete orally check a magnesium level.  Leukocytosis/fever: Likely due to MI and ischemic bowel. Will continue 5-day course  of IV Zosyn. Leukocytosis improving.  And he has remained afebrile since 03/12/2021.  Spitting up streaks of blood Minimal he relates it is decreasing not hypoxic. He is on aspirin and Plavix and was recently intubated probably traumatic   DVT prophylaxis: lovenox Family Communication:none Status is: Inpatient  Remains inpatient appropriate because:Hemodynamically unstable  Dispo: The patient is from: Home              Anticipated d/c is to: Home              Patient currently is not medically stable to d/c.   Difficult to place patient No        Code Status:  Code Status Orders  (From admission, onward)           Start     Ordered   03/12/21 0018  Full code  Continuous        03/12/21 0022           Code Status History     This patient has a current code status but no historical code status.         IV Access:   Peripheral IV   Procedures and diagnostic studies:    DG Chest 2 View  Result Date: 03/16/2021 CLINICAL DATA:  Hemoptysis EXAM: CHEST - 2 VIEW COMPARISON:  Chest x-ray dated March 13, 2021 FINDINGS: Interval placement of right arm PICC with tip positioned over the expected area of the lower SVC. Interval removal of PA catheter. Slightly decreased right-greater-than-left upper lung heterogeneous opacities. No evidence of pleural effusion or pneumothorax. IMPRESSION: Right arm PICC line tip is positioned over the expected area of the lower SVC. Slightly decreased right-greater-than-left upper lung heterogeneous opacities. Electronically Signed   By: Allegra Lai M.D.   On: 03/16/2021 13:22   ECHOCARDIOGRAM LIMITED  Result Date: 03/16/2021    ECHOCARDIOGRAM LIMITED REPORT   Patient Name:   Logan Brewer Date of Exam: 03/16/2021 Medical Rec #:  [id number]       Height:       69.0 in Accession #:    [id number]      Weight:       178.2 lb Date of Birth:  01-10-1956        BSA:          1.967 m Patient Age:    65 years        BP:           133/88 mmHg Patient Gender: M               HR:           78 bpm. Exam Location:  Inpatient Procedure: Limited Echo and Color Doppler Indications:    Cardiomyopathy-ischemic  History:        Patient has prior history of Echocardiogram examinations, most                 recent 03/12/2021. Acute MI; Risk Factors:Current Smoker.  Sonographer:    Ross Ludwig RDCS (AE) Referring Phys: [id number] Lynnell Catalan  Sonographer Comments: Patient had bandages in parasternal window area. IMPRESSIONS  1. Limited echo for LVEF and regional wall motion.  2. Left ventricular ejection fraction, by estimation, is 40 to 45%. Left ventricular ejection fraction by 2D MOD biplane is 39.8 %. The left ventricle has mildly decreased function. The left ventricle demonstrates regional wall motion abnormalities (see  scoring diagram/findings for description). There is moderate hypokinesis of the left ventricular, mid-apical anterior wall and apical segment.  Comparison(s): Changes from prior study are noted. 03/12/2021: LVEF 20-25%. FINDINGS  Left Ventricle: Left ventricular ejection fraction, by estimation, is 40 to 45%. Left ventricular ejection fraction by 2D MOD biplane is 39.8 %. The left ventricle has mildly decreased function. The left ventricle demonstrates regional wall motion abnormalities. Moderate hypokinesis of the left ventricular, mid-apical anterior wall and apical segment.  LV Wall Scoring: The mid and distal anterior septum, apical anterior segment, and apex are hypokinetic. The anterior wall, entire lateral wall, entire inferior wall, basal anteroseptal segment, mid inferoseptal segment, and basal inferoseptal segment are normal.  LV Volumes (MOD)  Biplane EF (MOD) LV vol d, MOD    104.0 ml      LV Biplane EF:   Left A2C:                                            ventricular LV vol d, MOD    93.8 ml                        ejection A4C:                                            fraction by LV vol s, MOD    53.9 ml                        2D MOD A2C:                                            biplane is LV vol s, MOD    68.4 ml                        39.8 %. A4C: LV SV MOD A2C:   50.1 ml LV SV MOD A4C:   93.8 ml LV SV MOD BP:    40.0 ml Zoila Shutter MD Electronically signed by Zoila Shutter MD Signature Date/Time: 03/16/2021/5:29:54 PM    Final      Medical Consultants:   None.   Subjective:    Logan Brewer denies any chest pain or shortness of breath, he relates his spitting up blood is decreased significantly compared to yesterday.  Objective:    Vitals:   03/16/21 1400 03/16/21 1551 03/16/21 1740 03/16/21 2037  BP:  133/88 130/73 137/84  Pulse: 88 96 78 87  Resp:  20  16  Temp:  99.2 F (37.3 C)  98.7 F (37.1 C)  TempSrc:  Oral  Oral  SpO2:    96%  Weight:      Height:       SpO2: 96 % O2 Flow Rate (L/min): 2 L/min FiO2 (%): (S) 40 % (per sats)   Intake/Output Summary (Last 24 hours) at 03/17/2021  0845 Last data filed at 03/17/2021 0550 Gross per 24 hour  Intake 840 ml  Output 325 ml  Net 515 ml    Filed Weights   03/14/21 0500 03/15/21 0432 03/15/21 1927  Weight: 89.5 kg 81.1 kg 80.8 kg    Exam: General exam: In no acute distress. Respiratory system: Good air movement and clear to auscultation. Cardiovascular system: S1 & S2 heard, RRR. No JVD. Gastrointestinal system: Abdomen is nondistended, soft and nontender.  Extremities: No pedal edema. Skin: No rashes, lesions or ulcers Psychiatry: Judgement and insight appear normal. Mood & affect appropriate.   Data Reviewed:    Labs: Basic Metabolic Panel: Recent Labs  Lab 03/12/21 0200 03/12/21 0512 03/12/21 1118 03/13/21 0325 03/13/21 1445 03/14/21 0306 03/15/21 0450 03/15/21 0838 03/16/21 0505 03/17/21 0500  NA  --  136   < > 134* 136 136  --  133* 136 134*  K  --  3.4*   < > 3.8 3.8 3.7  --  3.4* 3.1* 3.1*  CL  --  99   < > 99 103 105  --  101 103 104  CO2  --  18*   < > 26 25 24   --  24 25 22   GLUCOSE  --  315*   < > 138* 132* 110*  --  175* 122* 88  BUN  --  19   < > 21 18 15   --  20 22 20   CREATININE 1.79* 2.20*   < > 2.06* 1.84* 1.84*  --  1.68* 1.70* 1.63*  CALCIUM  --  7.3*   < > 7.7* 8.0* 8.0*  --  8.1* 8.4* 8.4*  MG 2.7* 3.2*  --  2.3  --  1.9 2.0  --   --   --    < > = values in this interval not displayed.    GFR Estimated Creatinine Clearance: 46.4 mL/min (A) (by C-G formula based on SCr of 1.63 mg/dL (H)). Liver Function Tests: Recent Labs  Lab 03/11/21 2245 03/13/21 0325 03/16/21 0505  AST 481* 598* 146*  ALT 418* 340* 112*  ALKPHOS 56 63 47  BILITOT 0.8 0.7 1.4*  PROT 5.2* 5.7* 6.2*  ALBUMIN 2.6* 2.6* 2.5*    No results for input(s): LIPASE, AMYLASE in the last 168 hours. No results for input(s): AMMONIA in the last 168 hours. Coagulation profile Recent Labs  Lab 03/11/21 2245  INR 1.5*    COVID-19 Labs  No results for input(s): DDIMER, FERRITIN, LDH, CRP in the last 72  hours.  Lab Results  Component Value Date   SARSCOV2NAA NEGATIVE 03/11/2021    CBC: Recent Labs  Lab 03/11/21 2245 03/11/21 2300 03/13/21 0325 03/14/21 0306 03/15/21 0450 03/16/21 0505 03/17/21 0500  WBC 9.6   < > 26.8* 20.5* 17.9* 13.6* 13.0*  NEUTROABS 3.4  --   --   --   --   --   --   HGB 13.9   < > 14.0 12.3* 13.5 13.0 12.0*  HCT 44.7   < > 39.9 36.6* 39.4 37.3* 35.2*  MCV 104.7*   < > 94.5 97.1 95.6 94.7 95.4  PLT 168   < > 186 145* 137* 153 184   < > = values in this interval not displayed.    Cardiac Enzymes: No results for input(s): CKTOTAL, CKMB, CKMBINDEX, TROPONINI in the last 168 hours. BNP (last 3 results) No results for input(s): PROBNP in the last 8760 hours. CBG: Recent Labs  Lab 03/16/21 0009 03/16/21 0406 03/16/21 0755 03/16/21 1203 03/16/21 2036  GLUCAP 147* 133* 113* 158* 98    D-Dimer: No results for input(s): DDIMER in the last 72 hours. Hgb A1c: No results for input(s): HGBA1C in the last 72 hours. Lipid Profile: No results for input(s): CHOL, HDL, LDLCALC, TRIG, CHOLHDL, LDLDIRECT in the last 72 hours. Thyroid function studies: No results for input(s): TSH, T4TOTAL, T3FREE, THYROIDAB in the last 72 hours.  Invalid input(s): FREET3 Anemia work up: No results for input(s): VITAMINB12, FOLATE, FERRITIN, TIBC, IRON, RETICCTPCT in the last 72 hours. Sepsis Labs: Recent Labs  Lab 03/13/21 0325 03/13/21 1432 03/13/21 2032 03/14/21 0306 03/15/21 0450 03/16/21 0505 03/17/21 0500  WBC 26.8*  --   --  20.5* 17.9* 13.6* 13.0*  LATICACIDVEN 1.6 1.5 1.4 0.8  --   --   --     Microbiology Recent Results (from the past 240 hour(s))  Resp Panel  by RT-PCR (Flu A&B, Covid) Nasopharyngeal Swab     Status: None   Collection Time: 03/11/21 10:49 PM   Specimen: Nasopharyngeal Swab; Nasopharyngeal(NP) swabs in vial transport medium  Result Value Ref Range Status   SARS Coronavirus 2 by RT PCR NEGATIVE NEGATIVE Final    Comment:  (NOTE) SARS-CoV-2 target nucleic acids are NOT DETECTED.  The SARS-CoV-2 RNA is generally detectable in upper respiratory specimens during the acute phase of infection. The lowest concentration of SARS-CoV-2 viral copies this assay can detect is 138 copies/mL. A negative result does not preclude SARS-Cov-2 infection and should not be used as the sole basis for treatment or other patient management decisions. A negative result may occur with  improper specimen collection/handling, submission of specimen other than nasopharyngeal swab, presence of viral mutation(s) within the areas targeted by this assay, and inadequate number of viral copies(<138 copies/mL). A negative result must be combined with clinical observations, patient history, and epidemiological information. The expected result is Negative.  Fact Sheet for Patients:  BloggerCourse.com  Fact Sheet for Healthcare Providers:  SeriousBroker.it  This test is no t yet approved or cleared by the [place] FDA and  has been authorized for detection and/or diagnosis of SARS-CoV-2 by FDA under an Emergency Use Authorization (EUA). This EUA will remain  in effect (meaning this test can be used) for the duration of the COVID-19 declaration under Section 564(b)(1) of the Act, 21 U.S.C.section 360bbb-3(b)(1), unless the authorization is terminated  or revoked sooner.       Influenza A by PCR NEGATIVE NEGATIVE Final   Influenza B by PCR NEGATIVE NEGATIVE Final    Comment: (NOTE) The Xpert Xpress SARS-CoV-2/FLU/RSV plus assay is intended as an aid in the diagnosis of influenza from Nasopharyngeal swab specimens and should not be used as a sole basis for treatment. Nasal washings and aspirates are unacceptable for Xpert Xpress SARS-CoV-2/FLU/RSV testing.  Fact Sheet for Patients: BloggerCourse.com  Fact Sheet for Healthcare  Providers: SeriousBroker.it  This test is not yet approved or cleared by the [place] FDA and has been authorized for detection and/or diagnosis of SARS-CoV-2 by FDA under an Emergency Use Authorization (EUA). This EUA will remain in effect (meaning this test can be used) for the duration of the COVID-19 declaration under Section 564(b)(1) of the Act, 21 U.S.C. section 360bbb-3(b)(1), unless the authorization is terminated or revoked.  Performed at [facility], [address] [place], [place] [address]   Culture, Respiratory w Gram Stain (tracheal aspirate)     Status: None   Collection Time: 03/12/21  2:09 AM   Specimen: Tracheal Aspirate; Respiratory  Result Value Ref Range Status   Specimen Description TRACHEAL ASPIRATE  Final   Special Requests NONE  Final   Gram Stain   Final    FEW SQUAMOUS EPITHELIAL CELLS PRESENT FEW WBC SEEN FEW GRAM POSITIVE COCCI    Culture   Final    FEW Normal respiratory flora-no Staph aureus or Pseudomonas seen Performed at [facility], [address] [place], [place] [address]    Report Status 03/14/2021 FINAL  Final  MRSA Next Gen by PCR, Nasal     Status: None   Collection Time: 03/12/21  2:09 AM   Specimen: Nasal Mucosa; Nasal Swab  Result Value Ref Range Status   MRSA by PCR Next Gen NOT DETECTED NOT DETECTED Final    Comment: (NOTE) The GeneXpert MRSA Assay (FDA approved for NASAL specimens only), is one component of  a comprehensive MRSA colonization surveillance program. It is not intended to diagnose MRSA infection nor to guide or monitor treatment for MRSA infections. Test performance is not FDA approved in patients less than 68 years old. Performed at [facility], [address] [place], [place] [address]      Medications:    aspirin EC  81 mg Oral Daily   atorvastatin  80 mg Oral Daily   carvedilol  6.25 mg Oral BID WC   Chlorhexidine Gluconate Cloth  6 each Topical  Daily   dapagliflozin propanediol  10 mg Oral Daily   enoxaparin (LOVENOX) injection  40 mg Subcutaneous Q24H   insulin aspart  0-15 Units Subcutaneous Q4H   potassium chloride  40 mEq Oral BID   sacubitril-valsartan  1 tablet Oral BID   sodium chloride flush  10-40 mL Intracatheter Q12H   spironolactone  25 mg Oral Daily   ticagrelor  90 mg Oral BID   Continuous Infusions:    LOS: 5 days   Marinda Elk  [facility]  03/17/2021, 8:45 AM

## 2021-03-17 NOTE — Progress Notes (Addendum)
Advanced Heart Failure Rounding Note  PCP-Cardiologist: Dr. Herbie Baltimore AHF: Dr. Gala Romney     Patient Profile   Logan Brewer is a 65 year old male admitted with VT/VF arrest secondary to acute anterior ST elevation MI with subsequent respiratory failure and cardiogenic shock.  Emergent LHC: severe single vessel CAD, 95-99% ostial LAD s/p PCI + DES. EF 20-25%. RV normal. No MR   Subjective:    9/22 Extubated 9/23 Developed ischemic bowel. Confirmed by CT. No perforation.  9/24 Milrinone discontinued  9/26 Limited echo w/ improved EF, now 40-45%   Feels ok today. Denies ischemic CP. No dyspnea. Continues w/ chest wall soreness from CPR.   Continues w/ scant hemoptysis. Overall improved. Hgb 13>>12.   Co-ox 67%. CVP 6. Wt stable at 178 lb. SBPs 130s   SCr stable 1.7.  K 3.1  Ambulated w/ CR yesterday. Did ok.   Sitting up eating breakfast. Appetite is good.    Objective:   Weight Range: 80.8 kg Body mass index is 26.32 kg/m.   Vital Signs:   Temp:  [98.5 F (36.9 C)-99.2 F (37.3 C)] 98.7 F (37.1 C) (09/26 2037) Pulse Rate:  [78-96] 87 (09/26 2037) Resp:  [16-20] 16 (09/26 2037) BP: (129-146)/(68-88) 137/84 (09/26 2037) SpO2:  [96 %] 96 % (09/26 2037) Last BM Date: 03/15/21  Weight change: Filed Weights   03/14/21 0500 03/15/21 0432 03/15/21 1927  Weight: 89.5 kg 81.1 kg 80.8 kg    Intake/Output:   Intake/Output Summary (Last 24 hours) at 03/17/2021 0759 Last data filed at 03/17/2021 0550 Gross per 24 hour  Intake 840 ml  Output 325 ml  Net 515 ml    Physical Exam   CVP 6  General:  Well appearing. No respiratory difficulty HEENT: normal Neck: supple. no JVD. Carotids 2+ bilat; no bruits. No lymphadenopathy or thyromegaly appreciated. Cor: PMI nondisplaced. Regular rate & rhythm. No rubs, gallops or murmurs. Lungs: clear Abdomen: soft, nontender, nondistended. No hepatosplenomegaly. No bruits or masses. Good bowel sounds. Extremities: no  cyanosis, clubbing, rash, edema + RUE PICC  Neuro: alert & oriented x 3, cranial nerves grossly intact. moves all 4 extremities w/o difficulty. Affect pleasant.   Telemetry   NSR 90s personally reviewed.   Labs    CBC Recent Labs    03/16/21 0505 03/17/21 0500  WBC 13.6* 13.0*  HGB 13.0 12.0*  HCT 37.3* 35.2*  MCV 94.7 95.4  PLT 153 184   Basic Metabolic Panel Recent Labs    03/50/09 0450 03/15/21 0838 03/16/21 0505 03/17/21 0500  NA  --    < > 136 134*  K  --    < > 3.1* 3.1*  CL  --    < > 103 104  CO2  --    < > 25 22  GLUCOSE  --    < > 122* 88  BUN  --    < > 22 20  CREATININE  --    < > 1.70* 1.63*  CALCIUM  --    < > 8.4* 8.4*  MG 2.0  --   --   --    < > = values in this interval not displayed.   Liver Function Tests Recent Labs    03/16/21 0505  AST 146*  ALT 112*  ALKPHOS 47  BILITOT 1.4*  PROT 6.2*  ALBUMIN 2.5*   No results for input(s): LIPASE, AMYLASE in the last 72 hours. Cardiac Enzymes No results for input(s): CKTOTAL, CKMB,  CKMBINDEX, TROPONINI in the last 72 hours.  BNP: BNP (last 3 results) No results for input(s): BNP in the last 8760 hours.  ProBNP (last 3 results) No results for input(s): PROBNP in the last 8760 hours.   D-Dimer No results for input(s): DDIMER in the last 72 hours. Hemoglobin A1C No results for input(s): HGBA1C in the last 72 hours.  Fasting Lipid Panel No results for input(s): CHOL, HDL, LDLCALC, TRIG, CHOLHDL, LDLDIRECT in the last 72 hours.  Thyroid Function Tests No results for input(s): TSH, T4TOTAL, T3FREE, THYROIDAB in the last 72 hours.  Invalid input(s): FREET3  Other results:   Imaging    DG Chest 2 View  Result Date: 03/16/2021 CLINICAL DATA:  Hemoptysis EXAM: CHEST - 2 VIEW COMPARISON:  Chest x-ray dated March 13, 2021 FINDINGS: Interval placement of right arm PICC with tip positioned over the expected area of the lower SVC. Interval removal of PA catheter. Slightly decreased  right-greater-than-left upper lung heterogeneous opacities. No evidence of pleural effusion or pneumothorax. IMPRESSION: Right arm PICC line tip is positioned over the expected area of the lower SVC. Slightly decreased right-greater-than-left upper lung heterogeneous opacities. Electronically Signed   By: Allegra Lai M.D.   On: 03/16/2021 13:22   ECHOCARDIOGRAM LIMITED  Result Date: 03/16/2021    ECHOCARDIOGRAM LIMITED REPORT   Patient Name:   Logan Brewer Date of Exam: 03/16/2021 Medical Rec #:  [id number]       Height:       69.0 in Accession #:    [id number]      Weight:       178.2 lb Date of Birth:  1956/03/25        BSA:          1.967 m Patient Age:    65 years        BP:           133/88 mmHg Patient Gender: M               HR:           78 bpm. Exam Location:  Inpatient Procedure: Limited Echo and Color Doppler Indications:    Cardiomyopathy-ischemic  History:        Patient has prior history of Echocardiogram examinations, most                 recent 03/12/2021. Acute MI; Risk Factors:Current Smoker.  Sonographer:    Ross Ludwig RDCS (AE) Referring Phys: [id number] Lynnell Catalan  Sonographer Comments: Patient had bandages in parasternal window area. IMPRESSIONS  1. Limited echo for LVEF and regional wall motion.  2. Left ventricular ejection fraction, by estimation, is 40 to 45%. Left ventricular ejection fraction by 2D MOD biplane is 39.8 %. The left ventricle has mildly decreased function. The left ventricle demonstrates regional wall motion abnormalities (see  scoring diagram/findings for description). There is moderate hypokinesis of the left ventricular, mid-apical anterior wall and apical segment. Comparison(s): Changes from prior study are noted. 03/12/2021: LVEF 20-25%. FINDINGS  Left Ventricle: Left ventricular ejection fraction, by estimation, is 40 to 45%. Left ventricular ejection fraction by 2D MOD biplane is 39.8 %. The left ventricle has mildly decreased function. The left ventricle  demonstrates regional wall motion abnormalities. Moderate hypokinesis of the left ventricular, mid-apical anterior wall and apical segment.  LV Wall Scoring: The mid and distal anterior septum, apical anterior segment, and apex are hypokinetic. The anterior wall, entire lateral wall, entire inferior wall, basal anteroseptal segment, mid  inferoseptal segment, and basal inferoseptal segment are normal.  LV Volumes (MOD)               Biplane EF (MOD) LV vol d, MOD    104.0 ml      LV Biplane EF:   Left A2C:                                            ventricular LV vol d, MOD    93.8 ml                        ejection A4C:                                            fraction by LV vol s, MOD    53.9 ml                        2D MOD A2C:                                            biplane is LV vol s, MOD    68.4 ml                        39.8 %. A4C: LV SV MOD A2C:   50.1 ml LV SV MOD A4C:   93.8 ml LV SV MOD BP:    40.0 ml Zoila Shutter MD Electronically signed by Zoila Shutter MD Signature Date/Time: 03/16/2021/5:29:54 PM    Final      Medications:     Scheduled Medications:  atorvastatin  80 mg Oral Daily   carvedilol  6.25 mg Oral BID WC   Chlorhexidine Gluconate Cloth  6 each Topical Daily   dapagliflozin propanediol  10 mg Oral Daily   digoxin  0.125 mg Oral Daily   enoxaparin (LOVENOX) injection  40 mg Subcutaneous Q24H   insulin aspart  0-15 Units Subcutaneous Q4H   sacubitril-valsartan  1 tablet Oral BID   sodium chloride flush  10-40 mL Intracatheter Q12H   spironolactone  25 mg Oral Daily   ticagrelor  90 mg Oral BID    Infusions:    PRN Medications: acetaminophen, hydrALAZINE, ondansetron (ZOFRAN) IV, polyethylene glycol, simethicone, sodium chloride flush, traMADol   Assessment/Plan   1. Cardiogenic shock (acute systolic HF) - Secondary to anterior ST elevation MI/OOH arrest - EF 20-25% on echo with akinesis of anteroseptal wall and apex - Bedside echo EF 35-40% 03/14/21.  -  Repeat Limited ECHO 9/26 w/ improved EF, 40-45%   - Co-ox stable off milrinone 67%  - Volume status stable. Does not need lasix.  - Increase entresto 97-103 bid - Continue spiro 25 mg daily - Stop Digoxin w/ improved EF  - Continue carvedilol 6.25 bid - Continue dapaglifozin 10 mg daily.  - Renal function stable.     2. CAD/Anterior ST elevation MI: - s/p PCI/DES to LAD as above - DAPT with aspirin and ticagrelor - No chest pain - Continue statin, ? blocker and MRA  - CR seeing   3. VT/VF  arrest: - D/t #2 - no further VT/VF  - off amio  - EF improving, now 40-45%. No indication for LifeVest   4. Ischemic bowel - confirmed on CT. No perf - clinically improved   5. AKI: -Scr 1.7 > 2.68 > 2.06 > 1.8 > 1.7>1.7>1.7   -D/t ATN/shock.  -Baseline not certain. -Follow daily BMET    6. Elevated LFTs: -Due to shock liver -Trending down   6. Acute respiratory failure: -Secondary to AMI and shock -Extubated 9/22   7. Hypokalemia: - K 3.1 - Supp w KCl 40 mEq BID  - Continue spiro 25   8. Tobacco use: - understands need for cessation  9. Leukocytosis/fever: - likely due to MI and ischemic bowel - WBC trending down.  - continue zosyn for 5 day course  10. Hemoptysis - improving. Hgb ok at 12 - suspect related to trauma from ETT - continue to monitor   Getting close to d/c. MD to see first for clearance.   Cardiac Meds for D/c.  ASA 81 mg daily  Brilinta 90 mg bid Atorvastatin 80 mg daily  Coreg 6.25 mg bid Farxiga 10 mg daily  Entresto 97-103 mg bid  Spironolactone 25 mg daily    Will get all HF meds through HF fund, except Entresto and Brilinta (will need patient assistance. We will assist w/ paper work). Will get first 30 day free supply through TOC.   We will arrange post hospital f/u in [facility] and will place appt info in AVS   Length of Stay: 5  Brittainy Delmer Islam  03/17/2021, 7:59 AM  Advanced Heart Failure Team Pager [phone] (M-F; 7a - 5p)   Please contact [facility] for night-coverage after hours (5p -7a ) and weekends on [email or web address]   Continues with hemoptysis. Seems to be trailing off. Feels a bit weak. Denies CP, orthopnea or PND. Co-ox ok CVP 6  General:  Well appearing. No resp difficulty HEENT: normal Neck: supple. no JVD. Carotids 2+ bilat; no bruits. No lymphadenopathy or thryomegaly appreciated. Cor: PMI nondisplaced. Regular rate & rhythm. No rubs, gallops or murmurs. Lungs: clear Abdomen: soft, nontender, mildly distended. No hepatosplenomegaly. No bruits or masses. Good bowel sounds. Extremities: no cyanosis, clubbing, rash, edema Neuro: alert & orientedx3, cranial nerves grossly intact. moves all 4 extremities w/o difficulty. Affect pleasant  Still with active hemoptysis. Volume status and co-ox ok. I do not think he is ready for d/c yet. Will get chest CT to further evaluate hemoptysis giving extensive tobacco history. Continue current HF meds. Echo reviewed personally. EF 40-45%.    Arvilla Meres, MD  12:51 PM

## 2021-03-18 ENCOUNTER — Inpatient Hospital Stay (HOSPITAL_COMMUNITY): Payer: Self-pay

## 2021-03-18 ENCOUNTER — Telehealth (HOSPITAL_COMMUNITY): Payer: Self-pay | Admitting: Pharmacy Technician

## 2021-03-18 LAB — GLUCOSE, CAPILLARY
Glucose-Capillary: 108 mg/dL — ABNORMAL HIGH (ref 70–99)
Glucose-Capillary: 108 mg/dL — ABNORMAL HIGH (ref 70–99)
Glucose-Capillary: 128 mg/dL — ABNORMAL HIGH (ref 70–99)
Glucose-Capillary: 90 mg/dL (ref 70–99)
Glucose-Capillary: 93 mg/dL (ref 70–99)
Glucose-Capillary: 97 mg/dL (ref 70–99)
Glucose-Capillary: 178 mg/dL — ABNORMAL HIGH (ref 70–99)
Glucose-Capillary: 124 mg/dL — ABNORMAL HIGH (ref 70–99)

## 2021-03-18 LAB — BASIC METABOLIC PANEL
Anion gap: 8 (ref 5–15)
BUN: 16 mg/dL (ref 8–23)
CO2: 21 mmol/L — ABNORMAL LOW (ref 22–32)
Calcium: 8.4 mg/dL — ABNORMAL LOW (ref 8.9–10.3)
Chloride: 107 mmol/L (ref 98–111)
Creatinine, Ser: 1.52 mg/dL — ABNORMAL HIGH (ref 0.61–1.24)
GFR, Estimated: 51 mL/min — ABNORMAL LOW (ref >60)
Glucose, Bld: 96 mg/dL (ref 70–99)
Potassium: 3.3 mmol/L — ABNORMAL LOW (ref 3.5–5.1)
Sodium: 136 mmol/L (ref 135–145)

## 2021-03-18 LAB — COOXEMETRY PANEL
Carboxyhemoglobin: 1 % (ref 0.5–1.5)
Methemoglobin: 1.1 % (ref 0.0–1.5)
O2 Saturation: 66.4 %
Total hemoglobin: 13.1 g/dL (ref 12.0–16.0)

## 2021-03-18 LAB — CBC
HCT: 37.7 % — ABNORMAL LOW (ref 39.0–52.0)
Hemoglobin: 13 g/dL (ref 13.0–17.0)
MCH: 32.6 pg (ref 26.0–34.0)
MCHC: 34.5 g/dL (ref 30.0–36.0)
MCV: 94.5 fL (ref 80.0–100.0)
Platelets: 199 10*3/uL (ref 150–400)
RBC: 3.99 MIL/uL — ABNORMAL LOW (ref 4.22–5.81)
RDW: 13 % (ref 11.5–15.5)
WBC: 11.7 10*3/uL — ABNORMAL HIGH (ref 4.0–10.5)
nRBC: 0 % (ref 0.0–0.2)

## 2021-03-18 LAB — MAGNESIUM: Magnesium: 1.8 mg/dL (ref 1.7–2.4)

## 2021-03-18 MED ORDER — MAGNESIUM SULFATE 2 GM/50ML IV SOLN
2.0000 g | Freq: Once | INTRAVENOUS | Status: AC
  Administered 2021-03-18: 2 g via INTRAVENOUS
  Filled 2021-03-18: qty 50

## 2021-03-18 MED ORDER — POTASSIUM CHLORIDE CRYS ER 20 MEQ PO TBCR
40.0000 meq | EXTENDED_RELEASE_TABLET | Freq: Three times a day (TID) | ORAL | Status: DC
  Administered 2021-03-18 – 2021-03-19 (×4): 40 meq via ORAL
  Filled 2021-03-18 (×4): qty 2

## 2021-03-18 MED ORDER — FUROSEMIDE 10 MG/ML IJ SOLN
40.0000 mg | Freq: Once | INTRAMUSCULAR | Status: AC
  Administered 2021-03-18: 40 mg via INTRAVENOUS
  Filled 2021-03-18: qty 4

## 2021-03-18 NOTE — Progress Notes (Signed)
Pharmacy DC planning with HF team   TOC free 30 cards for entresto and Brilinta  Patient assistance forms signed and brought to clinic- HF medication fund for  ASA dapaglaflozin, spiro and coreg, potassium, atorvastatin  Leota Sauers Pharm.D. CPP, BCPS Clinical Pharmacist [phone] 03/18/2021 11:02 AM

## 2021-03-18 NOTE — Progress Notes (Signed)
Physical Therapy Treatment Patient Details Name: Logan Brewer MRN: [id number] DOB: July 30, 1955 Today's Date: 03/18/2021   History of Present Illness 65 yo male presenting 9/21 with chest pain, became unresponsive in route resulting in being down 10 min prior to initiation of CPR by EMS on arrival. Pt intubated on arrival, and continued to have recurrent VF/torsades arrests in ER, each requiring defibrillation. Admitted with VT/VF arrest secondary to acute anterior ST elevation MI with subsequent respiratory failure and cardiogenic shock  Extubated 9/22. history of uncontrolled HTN and heavy, longstanding tobacco use.    PT Comments    Pt supine in bed on entry, agreeable to walking with therapy, however requests to use bathroom prior to leaving room. Pt is making good progress and is currently independent with bed mobility, mod I for transfers and supervision for 400 feet ambulation without AD. Discussed need for regular exercise after leaving hospital. Pt in agreement that he needs to implement a walking program. D/c plan remains appropriate. PT will continue to follow acutely.   Recommendations for follow up therapy are one component of a multi-disciplinary discharge planning process, led by the attending physician.  Recommendations may be updated based on patient status, additional functional criteria and insurance authorization.  Follow Up Recommendations  No PT follow up;Supervision for mobility/OOB     Equipment Recommendations  None recommended by PT       Precautions / Restrictions Precautions Precautions: None Restrictions Weight Bearing Restrictions: No     Mobility  Bed Mobility Overal bed mobility: Independent                  Transfers Overall transfer level: Modified independent               General transfer comment: increased time and effort, however able to self steady  Ambulation/Gait Ambulation/Gait assistance: Modified independent  (Device/Increase time) Gait Distance (Feet): 400 Feet Assistive device: None Gait Pattern/deviations: Step-through pattern;Decreased step length - right;Decreased step length - left;Drifts right/left;Narrow base of support Gait velocity: slowed Gait velocity interpretation: 1.31 - 2.62 ft/sec, indicative of limited community ambulator General Gait Details: pt gait is much steadier today, however continues to be slightly slowed          Balance Overall balance assessment: Modified Independent                                          Cognition Arousal/Alertness: Awake/alert Behavior During Therapy: WFL for tasks assessed/performed Overall Cognitive Status: Within Functional Limits for tasks assessed                                           General Comments General comments (skin integrity, edema, etc.): VSS on RA      Pertinent Vitals/Pain Pain Assessment: No/denies pain     PT Goals (current goals can now be found in the care plan section) Acute Rehab PT Goals Patient Stated Goal: go home PT Goal Formulation: With patient Time For Goal Achievement: 03/30/21 Potential to Achieve Goals: Fair Progress towards PT goals: Progressing toward goals    Frequency    Min 3X/week      PT Plan Current plan remains appropriate       AM-PAC PT "6 Clicks" Mobility   Outcome Measure  Help needed turning  from your back to your side while in a flat bed without using bedrails?: None Help needed moving from lying on your back to sitting on the side of a flat bed without using bedrails?: None Help needed moving to and from a bed to a chair (including a wheelchair)?: None Help needed standing up from a chair using your arms (e.g., wheelchair or bedside chair)?: None Help needed to walk in hospital room?: None Help needed climbing 3-5 steps with a railing? : A Little 6 Click Score: 23    End of Session Equipment Utilized During Treatment:  Gait belt Activity Tolerance: Patient tolerated treatment well Patient left: in chair;with call bell/phone within reach;with family/visitor present Nurse Communication: Mobility status;Other (comment) (ambulates on RA) PT Visit Diagnosis: Unsteadiness on feet (R26.81);Muscle weakness (generalized) (M62.81)     Time: 1000-1016 PT Time Calculation (min) (ACUTE ONLY): 16 min  Charges:  $Therapeutic Exercise: 8-22 mins                     Madylin Fairbank B. Beverely Risen PT, DPT Acute Rehabilitation Services Pager [phone] Office [phone]    Elon Alas [facility] 03/18/2021, 10:27 AM

## 2021-03-18 NOTE — TOC Progression Note (Signed)
Transition of Care [facility]) - Progression Note    Patient Details  Name: Natividad Halls MRN: [id number] Date of Birth: March 09, 1956  Transition of Care [facility]) CM/SW Contact  Aidan Moten, LCSWA Phone Number: 03/18/2021, 11:06 AM  Clinical Narrative:    HF CSW spoke with Mr. Ferrufino at bedside to follow up about the disability application as Mr. Dehart doesn't have a Orthoptist on file. Mr. Carmer reported that he doesn't know his social security number and will bring his card to his follow up appointment at the [facility] outpatient appointment. CSW provided Mr. Janee Morn with an appointment card for the [facility] outpatient clinic and encouraged him to follow up and to attend the appointment and bring his medications and if anything changes to please reach out so that CSW/HV clinic team can provide support.   CSW will continue to follow throughout discharge  Expected Discharge Plan: Home w Home Health Services Barriers to Discharge: Continued Medical Work up  Expected Discharge Plan and Services Expected Discharge Plan: Home w Home Health Services In-house Referral: Clinical Social Work Discharge Planning Services: CM Consult, Medication Assistance   Living arrangements for the past 2 months: Single Family Home                                       Social Determinants of Health (SDOH) Interventions Food Insecurity Interventions: Other (Comment) (Provided Mr. Janee Morn with a Statistician to give to DSS) Financial Strain Interventions: Artist, Other (Comment) (CSW reached out to [employer] to screen for Medicaid. Pt. reports concerns about not being able to work and paying his bills.) Housing Interventions: Other (Comment) (Pt. reports concerns about not being able to work and paying his rent/bills) Transportation Interventions: Intervention Not Indicated  Readmission Risk Interventions No flowsheet data found.  Pakou Rainbow, MSW, LCSWA [phone] Heart  Failure Social Worker

## 2021-03-18 NOTE — Telephone Encounter (Signed)
Advanced Heart Failure Patient Advocate Encounter  Sent in [employer] and Administrator, arts via fax.  Will follow up.

## 2021-03-18 NOTE — Progress Notes (Signed)
[facility] PROGRESS NOTE    Progress Note  Logan Brewer  ZJQ:[id number] DOB: April 18, 1956 DOA: 03/11/2021 PCP: Grayce Sessions, NP     Brief Narrative:   Logan Brewer is an 65 y.o. male past medical history of uncontrolled hypertension, heavy longtime smoker comes in to the ED for status post PPM arrest.  As reading from the chart he started having chest pain around 4:30 PM on the day of admission with diaphoresis while in route by EMS he coded, ROSC was achieved after 4 defibrillated attempts, he was intubated and KG showed ST segment elevation started on amiodarone, aspirin, heparin, he arrested again which required to defibrillation's, so he was started on lidocaine initially admitted to the ICU 2D echo showed an EF of 25% he was taken to the cardiac Cath Lab after cardiology consultation and he status post DES to the LAD    Significant Events: 9/22 LHC: Ost LAD to Prox LAD 99% stenosed, mid LA 30% stenosed  DES placed with 0% residual stenosis  Moderate pulm hypertension EF 25% with anterior akinesis on echo immediately following PCI  9/22 started on milrinone.  03/17/2019 repeat a 2D echo showed an EF of 40% with demonstrated regional wall motion abnormality, of the left ventricular mid apical anterior wall and apical segment.   Significant studies: 9/23 complaining of abdominal pain - CT abdomen showing possible ischemic sigmoiditis. Lactate down-trending.  Started on Zosyn for ischemic colitis.  Antibiotics: None  Microbiology data: Blood culture:  Procedures: None  Assessment/Plan:   VT/VF arrest Acute cardiogenic shock  STEMI Acute systolic heart failure: -Out of hospital VT/VF arrest, and CPR and chest compressions, 4 defibrillations before ROSC -Noted to have anterior STEMI on arrival, Status postcardiac cath with DES stent placement to the LAD.  Initial echo showed an EF of 20% with akinesia, material wall pain index -Treated with IV amiodarone and  milrinone initially, weaned off -Currently stable and euvolemic, continue Entresto, Aldactone, carvedilol, dapagliflozin -Continue aspirin, ticagrelor, statins. -2D echo showed an EF of 40% with wall motion abnormality. -Clinically improving and stable from this standpoint  Acute respiratory failure with hypoxia: Secondary to cardiogenic shock in the setting of anterior MI. PCCM consulted intubated eventually extubated on 03/12/2021.  Hemoptysis -Likely secondary to CPR, CT chest noted groundglass opacities in both upper lobes could reflect pulmonary hemorrhage -Continues to have scant hemoptysis, continue aspirin and ticagrelor, hold Lovenox today -Discussed with pulmonary, recommended supportive care -Will observe for another 24 hours  CAD with an anterior STEMI: S/p cardiac cath with a DES to the LAD, on DAPT therapy. -Stable, continue aspirin, ticagrelor, Coreg, statin  Ischemic bowel: Likely due to cardiogenic shock confirmed on CT. -Resolved, completed antibiotic course for this  Acute kidney injury/possibly ATN: Likely due to shock, uncertain of baseline on admission was 1.5. Improved with treatment of shock now 1.6.  Shock liver: Slowly improving.  Electrolyte imbalance/Hypokalemia: -Repleted   DVT prophylaxis: Hold Lovenox with hemoptysis Family Communication:none Status is: Inpatient  Remains inpatient appropriate because:Hemodynamically unstable  Dispo: The patient is from: Home              Anticipated d/c is to: Home tomorrow              Patient currently is not medically stable to d/c.   Difficult to place patient No        Code Status:     Code Status Orders  (From admission, onward)  Start     Ordered   03/12/21 0018  Full code  Continuous        03/12/21 0022           Code Status History     This patient has a current code status but no historical code status.         IV Access:   Peripheral  IV   Procedures and diagnostic studies:   CT CHEST WO CONTRAST  Result Date: 03/18/2021 CLINICAL DATA:  Hemoptysis EXAM: CT CHEST WITHOUT CONTRAST TECHNIQUE: Multidetector CT imaging of the chest was performed following the standard protocol without IV contrast. COMPARISON:  Chest x-ray 03/16/2021 FINDINGS: Cardiovascular: Coronary artery and aortic calcifications. No aneurysm. Heart is normal size. Mediastinum/Nodes: No mediastinal, hilar, or axillary adenopathy. Trachea and esophagus are unremarkable. Thyroid unremarkable. Lungs/Pleura: Centrilobular and paraseptal emphysema. Ground-glass opacities in both upper lobes, right slightly greater than left. Mid and lower lung fields are clear. No effusions. Upper Abdomen: Imaging into the upper abdomen demonstrates no acute findings. Musculoskeletal: Chest wall soft tissues are unremarkable. No acute bony abnormality. IMPRESSION: Coronary artery disease. Ground-glass opacities in the upper lobes bilaterally. This could reflect pulmonary hemorrhage or atypical/viral pneumonia. Aortic Atherosclerosis (ICD10-I70.0) and Emphysema (ICD10-J43.9). Electronically Signed   By: Charlett Nose M.D.   On: 03/18/2021 13:11   ECHOCARDIOGRAM LIMITED  Result Date: 03/16/2021    ECHOCARDIOGRAM LIMITED REPORT   Patient Name:   Logan Brewer Date of Exam: 03/16/2021 Medical Rec #:  [id number]       Height:       69.0 in Accession #:    [id number]      Weight:       178.2 lb Date of Birth:  21-Jun-1956        BSA:          1.967 m Patient Age:    65 years        BP:           133/88 mmHg Patient Gender: M               HR:           78 bpm. Exam Location:  Inpatient Procedure: Limited Echo and Color Doppler Indications:    Cardiomyopathy-ischemic  History:        Patient has prior history of Echocardiogram examinations, most                 recent 03/12/2021. Acute MI; Risk Factors:Current Smoker.  Sonographer:    Ross Ludwig RDCS (AE) Referring Phys: [id number] Lynnell Catalan   Sonographer Comments: Patient had bandages in parasternal window area. IMPRESSIONS  1. Limited echo for LVEF and regional wall motion.  2. Left ventricular ejection fraction, by estimation, is 40 to 45%. Left ventricular ejection fraction by 2D MOD biplane is 39.8 %. The left ventricle has mildly decreased function. The left ventricle demonstrates regional wall motion abnormalities (see  scoring diagram/findings for description). There is moderate hypokinesis of the left ventricular, mid-apical anterior wall and apical segment. Comparison(s): Changes from prior study are noted. 03/12/2021: LVEF 20-25%. FINDINGS  Left Ventricle: Left ventricular ejection fraction, by estimation, is 40 to 45%. Left ventricular ejection fraction by 2D MOD biplane is 39.8 %. The left ventricle has mildly decreased function. The left ventricle demonstrates regional wall motion abnormalities. Moderate hypokinesis of the left ventricular, mid-apical anterior wall and apical segment.  LV Wall Scoring: The mid and distal anterior septum, apical anterior segment, and apex  are hypokinetic. The anterior wall, entire lateral wall, entire inferior wall, basal anteroseptal segment, mid inferoseptal segment, and basal inferoseptal segment are normal.  LV Volumes (MOD)               Biplane EF (MOD) LV vol d, MOD    104.0 ml      LV Biplane EF:   Left A2C:                                            ventricular LV vol d, MOD    93.8 ml                        ejection A4C:                                            fraction by LV vol s, MOD    53.9 ml                        2D MOD A2C:                                            biplane is LV vol s, MOD    68.4 ml                        39.8 %. A4C: LV SV MOD A2C:   50.1 ml LV SV MOD A4C:   93.8 ml LV SV MOD BP:    40.0 ml Zoila Shutter MD Electronically signed by Zoila Shutter MD Signature Date/Time: 03/16/2021/5:29:54 PM    Final      Medical Consultants:   None.   Subjective:    Khyron Garno continues to have mild to moderate hemoptysis  Objective:    Vitals:   03/18/21 0100 03/18/21 0607 03/18/21 0848 03/18/21 1133  BP: 135/73 124/76 122/66 123/63  Pulse: 74 87 97 67  Resp: 19 18  18   Temp: 98.8 F (37.1 C) 98.8 F (37.1 C)  98.7 F (37.1 C)  TempSrc: Oral Oral  Oral  SpO2:  100%    Weight:      Height:       SpO2: 100 % O2 Flow Rate (L/min): 2 L/min FiO2 (%): (S) 40 % (per sats)   Intake/Output Summary (Last 24 hours) at 03/18/2021 1346 Last data filed at 03/18/2021 1305 Gross per 24 hour  Intake 710 ml  Output 1645 ml  Net -935 ml   Filed Weights   03/14/21 0500 03/15/21 0432 03/15/21 1927  Weight: 89.5 kg 81.1 kg 80.8 kg    Exam: General exam: AAOx3, no distress HEENT: no JVD CVS: S1S2/RRR Lungs: Clear bilaterally Abdomen: Soft, nontender, bowel sounds present Extremities: No edema   Data Reviewed:    Labs: Basic Metabolic Panel: Recent Labs  Lab 03/13/21 0325 03/13/21 1445 03/14/21 0306 03/15/21 0450 03/15/21 0838 03/16/21 0505 03/17/21 0500 03/18/21 0500  NA 134*   < > 136  --  133* 136 134* 136  K 3.8   < > 3.7  --  3.4* 3.1* 3.1*  3.3*  CL 99   < > 105  --  101 103 104 107  CO2 26   < > 24  --  24 25 22  21*  GLUCOSE 138*   < > 110*  --  175* 122* 88 96  BUN 21   < > 15  --  20 22 20 16   CREATININE 2.06*   < > 1.84*  --  1.68* 1.70* 1.63* 1.52*  CALCIUM 7.7*   < > 8.0*  --  8.1* 8.4* 8.4* 8.4*  MG 2.3  --  1.9 2.0  --   --  1.9 1.8   < > = values in this interval not displayed.   GFR Estimated Creatinine Clearance: 49.7 mL/min (A) (by C-G formula based on SCr of 1.52 mg/dL (H)). Liver Function Tests: Recent Labs  Lab 03/11/21 2245 03/13/21 0325 03/16/21 0505  AST 481* 598* 146*  ALT 418* 340* 112*  ALKPHOS 56 63 47  BILITOT 0.8 0.7 1.4*  PROT 5.2* 5.7* 6.2*  ALBUMIN 2.6* 2.6* 2.5*   No results for input(s): LIPASE, AMYLASE in the last 168 hours. No results for input(s): AMMONIA in the last 168  hours. Coagulation profile Recent Labs  Lab 03/11/21 2245  INR 1.5*   COVID-19 Labs  No results for input(s): DDIMER, FERRITIN, LDH, CRP in the last 72 hours.  Lab Results  Component Value Date   SARSCOV2NAA NEGATIVE 03/11/2021    CBC: Recent Labs  Lab 03/11/21 2245 03/11/21 2300 03/14/21 0306 03/15/21 0450 03/16/21 0505 03/17/21 0500 03/18/21 0500  WBC 9.6   < > 20.5* 17.9* 13.6* 13.0* 11.7*  NEUTROABS 3.4  --   --   --   --   --   --   HGB 13.9   < > 12.3* 13.5 13.0 12.0* 13.0  HCT 44.7   < > 36.6* 39.4 37.3* 35.2* 37.7*  MCV 104.7*   < > 97.1 95.6 94.7 95.4 94.5  PLT 168   < > 145* 137* 153 184 199   < > = values in this interval not displayed.   Cardiac Enzymes: No results for input(s): CKTOTAL, CKMB, CKMBINDEX, TROPONINI in the last 168 hours. BNP (last 3 results) No results for input(s): PROBNP in the last 8760 hours. CBG: Recent Labs  Lab 03/18/21 0139 03/18/21 0416 03/18/21 0606 03/18/21 0749 03/18/21 1132  GLUCAP 108* 90 93 108* 97   D-Dimer: No results for input(s): DDIMER in the last 72 hours. Hgb A1c: No results for input(s): HGBA1C in the last 72 hours. Lipid Profile: No results for input(s): CHOL, HDL, LDLCALC, TRIG, CHOLHDL, LDLDIRECT in the last 72 hours. Thyroid function studies: No results for input(s): TSH, T4TOTAL, T3FREE, THYROIDAB in the last 72 hours.  Invalid input(s): FREET3 Anemia work up: No results for input(s): VITAMINB12, FOLATE, FERRITIN, TIBC, IRON, RETICCTPCT in the last 72 hours. Sepsis Labs: Recent Labs  Lab 03/13/21 0325 03/13/21 1432 03/13/21 2032 03/14/21 0306 03/15/21 0450 03/16/21 0505 03/17/21 0500 03/18/21 0500  WBC 26.8*  --   --  20.5* 17.9* 13.6* 13.0* 11.7*  LATICACIDVEN 1.6 1.5 1.4 0.8  --   --   --   --    Microbiology Recent Results (from the past 240 hour(s))  Resp Panel by RT-PCR (Flu A&B, Covid) Nasopharyngeal Swab     Status: None   Collection Time: 03/11/21 10:49 PM   Specimen:  Nasopharyngeal Swab; Nasopharyngeal(NP) swabs in vial transport medium  Result Value Ref Range Status   SARS  Coronavirus 2 by RT PCR NEGATIVE NEGATIVE Final    Comment: (NOTE) SARS-CoV-2 target nucleic acids are NOT DETECTED.  The SARS-CoV-2 RNA is generally detectable in upper respiratory specimens during the acute phase of infection. The lowest concentration of SARS-CoV-2 viral copies this assay can detect is 138 copies/mL. A negative result does not preclude SARS-Cov-2 infection and should not be used as the sole basis for treatment or other patient management decisions. A negative result may occur with  improper specimen collection/handling, submission of specimen other than nasopharyngeal swab, presence of viral mutation(s) within the areas targeted by this assay, and inadequate number of viral copies(<138 copies/mL). A negative result must be combined with clinical observations, patient history, and epidemiological information. The expected result is Negative.  Fact Sheet for Patients:  BloggerCourse.com  Fact Sheet for Healthcare Providers:  SeriousBroker.it  This test is no t yet approved or cleared by the [place] FDA and  has been authorized for detection and/or diagnosis of SARS-CoV-2 by FDA under an Emergency Use Authorization (EUA). This EUA will remain  in effect (meaning this test can be used) for the duration of the COVID-19 declaration under Section 564(b)(1) of the Act, 21 U.S.C.section 360bbb-3(b)(1), unless the authorization is terminated  or revoked sooner.       Influenza A by PCR NEGATIVE NEGATIVE Final   Influenza B by PCR NEGATIVE NEGATIVE Final    Comment: (NOTE) The Xpert Xpress SARS-CoV-2/FLU/RSV plus assay is intended as an aid in the diagnosis of influenza from Nasopharyngeal swab specimens and should not be used as a sole basis for treatment. Nasal washings and aspirates are unacceptable for  Xpert Xpress SARS-CoV-2/FLU/RSV testing.  Fact Sheet for Patients: BloggerCourse.com  Fact Sheet for Healthcare Providers: SeriousBroker.it  This test is not yet approved or cleared by the [place] FDA and has been authorized for detection and/or diagnosis of SARS-CoV-2 by FDA under an Emergency Use Authorization (EUA). This EUA will remain in effect (meaning this test can be used) for the duration of the COVID-19 declaration under Section 564(b)(1) of the Act, 21 U.S.C. section 360bbb-3(b)(1), unless the authorization is terminated or revoked.  Performed at [facility], [address] [place], [place] [address]   Culture, Respiratory w Gram Stain (tracheal aspirate)     Status: None   Collection Time: 03/12/21  2:09 AM   Specimen: Tracheal Aspirate; Respiratory  Result Value Ref Range Status   Specimen Description TRACHEAL ASPIRATE  Final   Special Requests NONE  Final   Gram Stain   Final    FEW SQUAMOUS EPITHELIAL CELLS PRESENT FEW WBC SEEN FEW GRAM POSITIVE COCCI    Culture   Final    FEW Normal respiratory flora-no Staph aureus or Pseudomonas seen Performed at [facility], [address] [place], [place] [address]    Report Status 03/14/2021 FINAL  Final  MRSA Next Gen by PCR, Nasal     Status: None   Collection Time: 03/12/21  2:09 AM   Specimen: Nasal Mucosa; Nasal Swab  Result Value Ref Range Status   MRSA by PCR Next Gen NOT DETECTED NOT DETECTED Final    Comment: (NOTE) The GeneXpert MRSA Assay (FDA approved for NASAL specimens only), is one component of a comprehensive MRSA colonization surveillance program. It is not intended to diagnose MRSA infection nor to guide or monitor treatment for MRSA infections. Test performance is not FDA approved in patients less than 86 years old. Performed at [facility]  Lab, [address], [place], [place] [address]      Medications:    aspirin  EC  81 mg Oral Daily   atorvastatin  80 mg Oral Daily   carvedilol  6.25 mg Oral BID WC   Chlorhexidine Gluconate Cloth  6 each Topical Daily   dapagliflozin propanediol  10 mg Oral Daily   enoxaparin (LOVENOX) injection  40 mg Subcutaneous Q24H   insulin aspart  0-15 Units Subcutaneous Q4H   potassium chloride  40 mEq Oral TID   sacubitril-valsartan  1 tablet Oral BID   sodium chloride flush  10-40 mL Intracatheter Q12H   spironolactone  25 mg Oral Daily   ticagrelor  90 mg Oral BID   Continuous Infusions:    LOS: 6 days   Zannie Cove  [facility]  03/18/2021, 1:46 PM

## 2021-03-18 NOTE — Progress Notes (Addendum)
Advanced Heart Failure Rounding Note  PCP-Cardiologist: Dr. Herbie Baltimore AHF: Dr. Gala Romney     Patient Profile   Logan Brewer is a 65 year old male admitted with VT/VF arrest secondary to acute anterior ST elevation MI with subsequent respiratory failure and cardiogenic shock.  Emergent LHC: severe single vessel CAD, 95-99% ostial LAD s/p PCI + DES. EF 20-25%. RV normal. No MR   Subjective:    9/22 Extubated 9/23 Developed ischemic bowel. Confirmed by CT. No perforation.  9/24 Milrinone discontinued  9/26 Limited echo w/ improved EF, now 40-45%   Feels better. Cough not as productive. Small amount of blood. Denies SOB/chest pain.   Objective:   Weight Range: 80.8 kg Body mass index is 26.32 kg/m.   Vital Signs:   Temp:  [98.6 F (37 C)-98.8 F (37.1 C)] 98.8 F (37.1 C) (09/28 0607) Pulse Rate:  [66-97] 97 (09/28 0848) Resp:  [16-19] 18 (09/28 0607) BP: (119-136)/(66-78) 122/66 (09/28 0848) SpO2:  [96 %-100 %] 100 % (09/28 0607) Last BM Date: 03/18/21  Weight change: Filed Weights   03/14/21 0500 03/15/21 0432 03/15/21 1927  Weight: 89.5 kg 81.1 kg 80.8 kg    Intake/Output:   Intake/Output Summary (Last 24 hours) at 03/18/2021 0912 Last data filed at 03/18/2021 0100 Gross per 24 hour  Intake 710 ml  Output 1195 ml  Net -485 ml    Physical Exam  CVP 3 General:  Sitting in the chair. No resp difficulty HEENT: normal Neck: supple. no JVD. Carotids 2+ bilat; no bruits. No lymphadenopathy or thryomegaly appreciated. Cor: PMI nondisplaced. Regular rate & rhythm. No rubs, gallops or murmurs. Lungs: clear Abdomen: soft, nontender, nondistended. No hepatosplenomegaly. No bruits or masses. Good bowel sounds. Extremities: no cyanosis, clubbing, rash, edema. RUE PICC  Neuro: alert & orientedx3, cranial nerves grossly intact. moves all 4 extremities w/o difficulty. Affect pleasant   Telemetry   SR 70-80s personally reviewed.   Labs    CBC Recent Labs     03/17/21 0500 03/18/21 0500  WBC 13.0* 11.7*  HGB 12.0* 13.0  HCT 35.2* 37.7*  MCV 95.4 94.5  PLT 184 199   Basic Metabolic Panel Recent Labs    35/00/93 0500 03/18/21 0500  NA 134* 136  K 3.1* 3.3*  CL 104 107  CO2 22 21*  GLUCOSE 88 96  BUN 20 16  CREATININE 1.63* 1.52*  CALCIUM 8.4* 8.4*  MG 1.9 1.8   Liver Function Tests Recent Labs    03/16/21 0505  AST 146*  ALT 112*  ALKPHOS 47  BILITOT 1.4*  PROT 6.2*  ALBUMIN 2.5*   No results for input(s): LIPASE, AMYLASE in the last 72 hours. Cardiac Enzymes No results for input(s): CKTOTAL, CKMB, CKMBINDEX, TROPONINI in the last 72 hours.  BNP: BNP (last 3 results) No results for input(s): BNP in the last 8760 hours.  ProBNP (last 3 results) No results for input(s): PROBNP in the last 8760 hours.   D-Dimer No results for input(s): DDIMER in the last 72 hours. Hemoglobin A1C No results for input(s): HGBA1C in the last 72 hours.  Fasting Lipid Panel No results for input(s): CHOL, HDL, LDLCALC, TRIG, CHOLHDL, LDLDIRECT in the last 72 hours.  Thyroid Function Tests No results for input(s): TSH, T4TOTAL, T3FREE, THYROIDAB in the last 72 hours.  Invalid input(s): FREET3  Other results:   Imaging    No results found.   Medications:     Scheduled Medications:  aspirin EC  81 mg Oral  Daily   atorvastatin  80 mg Oral Daily   carvedilol  6.25 mg Oral BID WC   Chlorhexidine Gluconate Cloth  6 each Topical Daily   dapagliflozin propanediol  10 mg Oral Daily   enoxaparin (LOVENOX) injection  40 mg Subcutaneous Q24H   insulin aspart  0-15 Units Subcutaneous Q4H   potassium chloride  40 mEq Oral BID   sacubitril-valsartan  1 tablet Oral BID   sodium chloride flush  10-40 mL Intracatheter Q12H   spironolactone  25 mg Oral Daily   ticagrelor  90 mg Oral BID    Infusions:    PRN Medications: acetaminophen, hydrALAZINE, ondansetron (ZOFRAN) IV, polyethylene glycol, simethicone, sodium chloride flush,  traMADol   Assessment/Plan   1. Cardiogenic shock (acute systolic HF) - Secondary to anterior ST elevation MI/OOH arrest - EF 20-25% on echo with akinesis of anteroseptal wall and apex - Bedside echo EF 35-40% 03/14/21.  - Repeat Limited ECHO 9/26 w/ improved EF, 40-45%   - Co-ox stable off milrinone. 66%.   -Volume status stable. CVP 3-4  - Continue entresto 97-103 bid - Continue spiro 25 mg daily - Stop Digoxin w/ improved EF  - Continue carvedilol 6.25 bid - Continue dapaglifozin 10 mg daily.  - Renal function stable.    2. CAD/Anterior ST elevation MI: - s/p PCI/DES to LAD as above - DAPT with aspirin and ticagrelor - No chest pain.  - Continue statin, ? blocker and MRA  - CR seeing   3. VT/VF arrest: - D/t #2 - no further VT/VF  - off amio  - EF improving, now 40-45%. No indication for LifeVest   4. Ischemic bowel - confirmed on CT. No perf - clinically improved   5. AKI: -Scr peaked at 2.7--> today 1.5   -D/t ATN/shock.  -Baseline not certain.    6. Elevated LFTs: -Due to shock liver -Trending down   6. Acute respiratory failure: -Secondary to AMI and shock -Extubated 9/22 - stable on room air.    7. Hypokalemia: - K 3.3 - Supp K . Increase K Dur 40 meq three times a day.  - Continue spiro 25   8. Tobacco use: - understands need for cessation  9. Leukocytosis/fever: - likely due to MI and ischemic bowel - WBC trending down.  - continue zosyn for 5 day course  10. Hemoptysis - improving. Hgb ok at 12 - suspect related to trauma from ETT - CT chest ordered 9/27  if ok should be able to go home.    Cardiac Meds for D/c.  ASA 81 mg daily  Brilinta 90 mg bid Atorvastatin 80 mg daily  Coreg 6.25 mg bid Farxiga 10 mg daily  Entresto 97-103 mg bid  Spironolactone 25 mg daily  Potassium 40 meq three times a day    Will get all HF meds through HF fund, except Entresto and Brilinta (will need patient assistance. We will assist w/ paper work).  Will get first 30 day free supply through T[facility].   We will arrange post hospital f/u in [facility] and will place appt info in AVS . Plan to check BMET next week.   Length of Stay: 6  Amy Clegg, NP  03/18/2021, 9:12 AM  Advanced Heart Failure Team Pager [phone] (M-F; 7a - 5p)  Please contact [facility] for night-coverage after hours (5p -7a ) and weekends on [email or web address]  Patient seen and examined with the above-signed Advanced Practice Provider and/or Housestaff. I personally reviewed laboratory data, imaging studies  and relevant notes. I independently examined the patient and formulated the important aspects of the plan. I have edited the note to reflect any of my changes or salient points. I have personally discussed the plan with the patient and/or family.  Feeling much better. Hemoptysis has slowed dramatically. Denies SOB, orthopnea or PND.   CT chest this am on my preliminary review shows moderate emphysema and mild ground glass - likely edema.   General:  Well appearing. No resp difficulty HEENT: normal Neck: supple. no JVD. Carotids 2+ bilat; no bruits. No lymphadenopathy or thryomegaly appreciated. Cor: PMI nondisplaced. Regular rate & rhythm. No rubs, gallops or murmurs. Lungs: clear Abdomen: soft, nontender, nondistended. No hepatosplenomegaly. No bruits or masses. Good bowel sounds. Extremities: no cyanosis, clubbing, rash, edema Neuro: alert & orientedx3, cranial nerves grossly intact. moves all 4 extremities w/o difficulty. Affect pleasant   He is ready for d/c today. Await final CT read. Will give one dose IV lasix this am prior to d/c. Agree with meds as above.   Arvilla Meres, MD  10:40 AM

## 2021-03-18 NOTE — Progress Notes (Signed)
CARDIAC REHAB PHASE I   Went to offer to walk with pt. Pt states recent ambulation with PT, feeling stronger today. Called wife on phone and reinforced importance of medication compliance, daily weights, restrictions, and exercise guidelines. Referred to CRP II [place]. Hopeful for d/c today.  [phone] Reynold Bowen, RN BSN 03/18/2021 11:21 AM

## 2021-03-18 NOTE — Progress Notes (Signed)
Glucometer data transfer not working since around 0000. CBG for this pt were 108 at 0045 and 90 at 0418. No insulin given for measurement - sliding scale order parameters not met.

## 2021-03-18 NOTE — Progress Notes (Signed)
Nutrition Follow-up Diet Education  Nutrition Education Note  RD followed-up with patient for diet education regarding a CHF and diabetes.   "Heart Healthy Consistent Carbohydrate Nutrition Therapy" handout from the [employer] has been added to his discharge instructions.   Reviewed patient's dietary recall. Patient called his wife to listen to the education. Provided examples on ways to decrease sodium and fat intake in diet. Discouraged intake of processed foods and use of salt shaker. Encouraged fresh fruits and vegetables as well as whole grain sources of carbohydrates to maximize fiber intake. Teach back method used.  Expect good compliance.  Body mass index is 26.32 kg/m. Pt meets criteria for overweight based on current BMI.  Current diet order is heart healthy, patient is consuming approximately 100% of meals at this time. Labs and medications reviewed. No further nutrition interventions warranted at this time. RD contact information provided. If additional nutrition issues arise, please re-consult RD.   Gabriel Rainwater, RD, LDN, CNSC Please refer to [facility] for contact information.

## 2021-03-19 ENCOUNTER — Other Ambulatory Visit (HOSPITAL_COMMUNITY): Payer: Self-pay

## 2021-03-19 ENCOUNTER — Encounter: Payer: Self-pay | Admitting: Physical Medicine and Rehabilitation

## 2021-03-19 LAB — BASIC METABOLIC PANEL
Anion gap: 9 (ref 5–15)
BUN: 18 mg/dL (ref 8–23)
CO2: 19 mmol/L — ABNORMAL LOW (ref 22–32)
Calcium: 8.8 mg/dL — ABNORMAL LOW (ref 8.9–10.3)
Chloride: 110 mmol/L (ref 98–111)
Creatinine, Ser: 1.73 mg/dL — ABNORMAL HIGH (ref 0.61–1.24)
GFR, Estimated: 44 mL/min — ABNORMAL LOW (ref >60)
Glucose, Bld: 95 mg/dL (ref 70–99)
Potassium: 4.3 mmol/L (ref 3.5–5.1)
Sodium: 138 mmol/L (ref 135–145)

## 2021-03-19 LAB — CBC
HCT: 35.9 % — ABNORMAL LOW (ref 39.0–52.0)
Hemoglobin: 12 g/dL — ABNORMAL LOW (ref 13.0–17.0)
MCH: 32.2 pg (ref 26.0–34.0)
MCHC: 33.4 g/dL (ref 30.0–36.0)
MCV: 96.2 fL (ref 80.0–100.0)
Platelets: 284 10*3/uL (ref 150–400)
RBC: 3.73 MIL/uL — ABNORMAL LOW (ref 4.22–5.81)
RDW: 13.2 % (ref 11.5–15.5)
WBC: 11.4 10*3/uL — ABNORMAL HIGH (ref 4.0–10.5)
nRBC: 0 % (ref 0.0–0.2)

## 2021-03-19 LAB — COOXEMETRY PANEL
Carboxyhemoglobin: 1 % (ref 0.5–1.5)
Methemoglobin: 1.1 % (ref 0.0–1.5)
O2 Saturation: 68.7 %
Total hemoglobin: 12.5 g/dL (ref 12.0–16.0)

## 2021-03-19 LAB — GLUCOSE, CAPILLARY
Glucose-Capillary: 105 mg/dL — ABNORMAL HIGH (ref 70–99)
Glucose-Capillary: 105 mg/dL — ABNORMAL HIGH (ref 70–99)
Glucose-Capillary: 182 mg/dL — ABNORMAL HIGH (ref 70–99)
Glucose-Capillary: 93 mg/dL (ref 70–99)

## 2021-03-19 MED ORDER — CARVEDILOL 6.25 MG PO TABS
6.2500 mg | ORAL_TABLET | Freq: Two times a day (BID) | ORAL | 0 refills | Status: DC
  Filled 2021-03-19: qty 60, 30d supply, fill #0

## 2021-03-19 MED ORDER — POTASSIUM CHLORIDE CRYS ER 20 MEQ PO TBCR
40.0000 meq | EXTENDED_RELEASE_TABLET | Freq: Every day | ORAL | 0 refills | Status: DC
  Filled 2021-03-19: qty 60, 30d supply, fill #0

## 2021-03-19 MED ORDER — ASPIRIN 81 MG PO TBEC: 81.0000 mg | DELAYED_RELEASE_TABLET | Freq: Every day | ORAL | 0 refills | Status: DC

## 2021-03-19 MED ORDER — SPIRONOLACTONE 25 MG PO TABS
25.0000 mg | ORAL_TABLET | Freq: Every day | ORAL | 0 refills | Status: DC
  Filled 2021-03-19: qty 30, 30d supply, fill #0

## 2021-03-19 MED ORDER — TICAGRELOR 90 MG PO TABS
90.0000 mg | ORAL_TABLET | Freq: Two times a day (BID) | ORAL | 0 refills | Status: DC
  Filled 2021-03-19: qty 60, 30d supply, fill #0

## 2021-03-19 MED ORDER — ATORVASTATIN CALCIUM 80 MG PO TABS
80.0000 mg | ORAL_TABLET | Freq: Every day | ORAL | 0 refills | Status: DC
  Filled 2021-03-19: qty 30, 30d supply, fill #0

## 2021-03-19 MED ORDER — DAPAGLIFLOZIN PROPANEDIOL 10 MG PO TABS
10.0000 mg | ORAL_TABLET | Freq: Every day | ORAL | 0 refills | Status: DC
  Filled 2021-03-19: qty 30, 30d supply, fill #0

## 2021-03-19 MED ORDER — POTASSIUM CHLORIDE CRYS ER 20 MEQ PO TBCR: 40.0000 meq | EXTENDED_RELEASE_TABLET | Freq: Every day | ORAL | 0 refills | Status: DC

## 2021-03-19 MED ORDER — POTASSIUM CHLORIDE CRYS ER 20 MEQ PO TBCR
40.0000 meq | EXTENDED_RELEASE_TABLET | Freq: Two times a day (BID) | ORAL | 0 refills | Status: DC
  Filled 2021-03-19: qty 60, 15d supply, fill #0

## 2021-03-19 MED ORDER — POTASSIUM CHLORIDE CRYS ER 20 MEQ PO TBCR
40.0000 meq | EXTENDED_RELEASE_TABLET | Freq: Every day | ORAL | 0 refills | Status: DC
  Filled 2021-03-19: qty 30, 15d supply, fill #0

## 2021-03-19 MED ORDER — SACUBITRIL-VALSARTAN 97-103 MG PO TABS
1.0000 | ORAL_TABLET | Freq: Two times a day (BID) | ORAL | 0 refills | Status: DC
  Filled 2021-03-19: qty 60, 30d supply, fill #0

## 2021-03-19 NOTE — Progress Notes (Addendum)
Advanced Heart Failure Rounding Note  PCP-Cardiologist: Dr. Herbie Baltimore AHF: Dr. Gala Romney     Patient Profile   Logan Brewer is a 65 year old male admitted with VT/VF arrest secondary to acute anterior ST elevation MI with subsequent respiratory failure and cardiogenic shock.  Emergent LHC: severe single vessel CAD, 95-99% ostial LAD s/p PCI + DES. EF 20-25%. RV normal. No MR   Subjective:    9/22 Extubated 9/23 Developed ischemic bowel. Confirmed by CT. No perforation.  9/24 Milrinone discontinued  9/26 Limited echo w/ improved EF, now 40-45%   Chest CT yesterday showed no mediastinal, hilar, or axillary adenopathy. Positive for ground-glass opacities in the upper lobes bilaterally. This could reflect pulmonary hemorrhage or atypical/viral pneumonia.  Still w/ occasional hemoptysis. Small amount per pt report, seems to be improving.  Hgb stable at 12. WBC 11 (stable). AF.   Denies CP. No dyspnea. Has been ambulating w/ CR w/o difficulty.  Co-ox 69%  CVP low 2-3 but no orthostatic symptoms. BMP in process. SBPs 120s-130s   Objective:   Weight Range: 80.8 kg Body mass index is 26.32 kg/m.   Vital Signs:   Temp:  [98.5 F (36.9 C)-98.7 F (37.1 C)] 98.5 F (36.9 C) (09/28 2105) Pulse Rate:  [67-97] 72 (09/29 0621) Resp:  [18-19] 19 (09/28 2105) BP: (122-131)/(50-79) 130/63 (09/29 0621) SpO2:  [100 %] 100 % (09/28 2105) Last BM Date: 03/18/21  Weight change: Filed Weights   03/14/21 0500 03/15/21 0432 03/15/21 1927  Weight: 89.5 kg 81.1 kg 80.8 kg    Intake/Output:   Intake/Output Summary (Last 24 hours) at 03/19/2021 1638 Last data filed at 03/19/2021 0618 Gross per 24 hour  Intake --  Output 1400 ml  Net -1400 ml    Physical Exam   CVP 2-3  General:  Well appearing. No respiratory difficulty HEENT: normal Neck: supple. no JVD. Carotids 2+ bilat; no bruits. No lymphadenopathy or thyromegaly appreciated. Cor: PMI nondisplaced. Regular rate & rhythm. No  rubs, gallops or murmurs. Lungs: clear Abdomen: soft, nontender, nondistended. No hepatosplenomegaly. No bruits or masses. Good bowel sounds. Extremities: no cyanosis, clubbing, rash, edema  + RUE PICC  Neuro: alert & oriented x 3, cranial nerves grossly intact. moves all 4 extremities w/o difficulty. Affect pleasant.   Telemetry   NSR 90s  personally reviewed.   Labs    CBC Recent Labs    03/18/21 0500 03/19/21 0649  WBC 11.7* 11.4*  HGB 13.0 12.0*  HCT 37.7* 35.9*  MCV 94.5 96.2  PLT 199 284   Basic Metabolic Panel Recent Labs    45/36/46 0500 03/18/21 0500  NA 134* 136  K 3.1* 3.3*  CL 104 107  CO2 22 21*  GLUCOSE 88 96  BUN 20 16  CREATININE 1.63* 1.52*  CALCIUM 8.4* 8.4*  MG 1.9 1.8   Liver Function Tests No results for input(s): AST, ALT, ALKPHOS, BILITOT, PROT, ALBUMIN in the last 72 hours.  No results for input(s): LIPASE, AMYLASE in the last 72 hours. Cardiac Enzymes No results for input(s): CKTOTAL, CKMB, CKMBINDEX, TROPONINI in the last 72 hours.  BNP: BNP (last 3 results) No results for input(s): BNP in the last 8760 hours.  ProBNP (last 3 results) No results for input(s): PROBNP in the last 8760 hours.   D-Dimer No results for input(s): DDIMER in the last 72 hours. Hemoglobin A1C No results for input(s): HGBA1C in the last 72 hours.  Fasting Lipid Panel No results for input(s): CHOL, HDL,  LDLCALC, TRIG, CHOLHDL, LDLDIRECT in the last 72 hours.  Thyroid Function Tests No results for input(s): TSH, T4TOTAL, T3FREE, THYROIDAB in the last 72 hours.  Invalid input(s): FREET3  Other results:   Imaging    CT CHEST WO CONTRAST  Result Date: 03/18/2021 CLINICAL DATA:  Hemoptysis EXAM: CT CHEST WITHOUT CONTRAST TECHNIQUE: Multidetector CT imaging of the chest was performed following the standard protocol without IV contrast. COMPARISON:  Chest x-ray 03/16/2021 FINDINGS: Cardiovascular: Coronary artery and aortic calcifications. No  aneurysm. Heart is normal size. Mediastinum/Nodes: No mediastinal, hilar, or axillary adenopathy. Trachea and esophagus are unremarkable. Thyroid unremarkable. Lungs/Pleura: Centrilobular and paraseptal emphysema. Ground-glass opacities in both upper lobes, right slightly greater than left. Mid and lower lung fields are clear. No effusions. Upper Abdomen: Imaging into the upper abdomen demonstrates no acute findings. Musculoskeletal: Chest wall soft tissues are unremarkable. No acute bony abnormality. IMPRESSION: Coronary artery disease. Ground-glass opacities in the upper lobes bilaterally. This could reflect pulmonary hemorrhage or atypical/viral pneumonia. Aortic Atherosclerosis (ICD10-I70.0) and Emphysema (ICD10-J43.9). Electronically Signed   By: Charlett Nose M.D.   On: 03/18/2021 13:11     Medications:     Scheduled Medications:  aspirin EC  81 mg Oral Daily   atorvastatin  80 mg Oral Daily   carvedilol  6.25 mg Oral BID WC   Chlorhexidine Gluconate Cloth  6 each Topical Daily   dapagliflozin propanediol  10 mg Oral Daily   insulin aspart  0-15 Units Subcutaneous Q4H   potassium chloride  40 mEq Oral TID   sacubitril-valsartan  1 tablet Oral BID   sodium chloride flush  10-40 mL Intracatheter Q12H   spironolactone  25 mg Oral Daily   ticagrelor  90 mg Oral BID    Infusions:    PRN Medications: acetaminophen, hydrALAZINE, ondansetron (ZOFRAN) IV, polyethylene glycol, simethicone, sodium chloride flush, traMADol   Assessment/Plan   1. Cardiogenic shock (acute systolic HF) - Secondary to anterior ST elevation MI/OOH arrest - EF 20-25% on echo with akinesis of anteroseptal wall and apex - Bedside echo EF 35-40% 03/14/21.  - Repeat Limited ECHO 9/26 w/ improved EF, 40-45%   - Co-ox stable off milrinone. 69%.   - Volume status stable. CVP 2-3  - Continue entresto 97-103 bid - Continue spiro 25 mg daily - Stop Digoxin w/ improved EF  - Continue carvedilol 6.25 bid - Continue  dapaglifozin 10 mg daily.  - Renal function stable.    2. CAD/Anterior ST elevation MI: - s/p PCI/DES to LAD as above - DAPT with aspirin and ticagrelor - No chest pain.  - Continue statin, ? blocker and MRA  - CR seeing   3. VT/VF arrest: - D/t #2 - no further VT/VF  - off amio  - EF improving, now 40-45%. No indication for LifeVest   4. Ischemic bowel - confirmed on CT. No perf - clinically improved w/ zosyn    5. AKI: -Scr peaked at 2.7-->1.5 -->pending  -D/t ATN/shock.  -Baseline not certain.    6. Elevated LFTs: -Due to shock liver -Trending down   6. Acute respiratory failure: -Secondary to AMI and shock -Extubated 9/22 - stable on room air.    7. Hypokalemia: - BMP in process  - c/w KCl  - Continue spiro 25   8. Tobacco use: - understands need for cessation  9. Leukocytosis/fever: - likely due to MI and ischemic bowel - WBC trending down.  - completed 5 day course of zosyn   10. Hemoptysis -Likely  secondary to CPR +trauma from ETT -CT chest noted groundglass opacities in both upper lobes could reflect pulmonary hemorrhage -Continues to have scant hemoptysis but improving, Hgb stable at 12  -TRH d/w pulmonary, recommended supportive care   Stable for d/c from cardiac standpoint.   Cardiac Meds for D/c.  ASA 81 mg daily  Brilinta 90 mg bid Atorvastatin 80 mg daily  Coreg 6.25 mg bid Farxiga 10 mg daily  Entresto 97-103 mg bid  Spironolactone 25 mg daily  Potassium 40 meq three times a day   Will get all HF meds through HF fund, except Entresto and Brilinta (will need patient assistance. We will assist w/ paper work). Will get first 30 day free supply through [facility].   We will arrange post hospital f/u in [facility] and will place appt info in AVS . Plan to check BMET next week.   Length of Stay: [id number], PA-C  03/19/2021, 7:12 AM  Advanced Heart Failure Team Pager [phone] (M-F; 7a - 5p)  Please contact [facility] for  night-coverage after hours (5p -7a ) and weekends on [email or web address]  Patient seen and examined with the above-signed Advanced Practice Provider and/or Housestaff. I personally reviewed laboratory data, imaging studies and relevant notes. I independently examined the patient and formulated the important aspects of the plan. I have edited the note to reflect any of my changes or salient points. I have personally discussed the plan with the patient and/or family.  Doing well. Still with mild hemoptysis. CT chest reviewed. Mild ground glass. + moderate COPD  General:  Well appearing. No resp difficulty HEENT: normal Neck: supple. no JVD. Carotids 2+ bilat; no bruits. No lymphadenopathy or thryomegaly appreciated. Cor: PMI nondisplaced. Regular rate & rhythm. No rubs, gallops or murmurs. Lungs: clear Abdomen: soft, nontender, nondistended. No hepatosplenomegaly. No bruits or masses. Good bowel sounds. Extremities: no cyanosis, clubbing, rash, edema Neuro: alert & orientedx3, cranial nerves grossly intact. moves all 4 extremities w/o difficulty. Affect pleasant  Ok for d/c today on current meds. Will arrange for HF f/u.  Arvilla Meres, MD  8:05 AM

## 2021-03-19 NOTE — Discharge Summary (Signed)
Physician Discharge Summary  Logan Brewer EAV:[id number] DOB: March 18, 1956 DOA: 03/11/2021  PCP: Grayce Sessions, NP  Admit date: 03/11/2021 Discharge date: 03/19/2021  Time spent:  Recommendations for Outpatient Follow-up:  Heart failure team on 10/6, needs BMP at follow-up  Discharge Diagnoses:  Principal Problem:   Cardiac arrest [facility]) Cardiogenic shock Acute systolic CHF   Atrial fibrillation (HCC)   STEMI (ST elevation myocardial infarction) (HCC)   AKI (acute kidney injury) (HCC)   Shock liver   Endotracheally intubated   Acute pulmonary edema (HCC)   Anoxic encephalopathy (HCC)   Acute ST elevation myocardial infarction (STEMI) of anterior wall (HCC)   Metabolic acidosis   Coronary artery disease involving native coronary artery of native heart with unstable angina pectoris [facility])   Discharge Condition: Stable  Diet recommendation: Low-sodium, heart healthy  Filed Weights   03/14/21 0500 03/15/21 0432 03/15/21 1927  Weight: 89.5 kg 81.1 kg 80.8 kg    History of present illness:  Logan Brewer is an 65 y.o. male past medical history of uncontrolled hypertension, heavy longtime smoker comes in to the ED for status post PPM arrest.  As reading from the chart he started having chest pain around 4:30 PM on the day of admission with diaphoresis while in route by EMS he coded, ROSC was achieved after 4 defibrillated attempts, he was intubated and KG showed ST segment elevation started on amiodarone, aspirin, heparin, he arrested again which required to defibrillation's, so he was started on lidocaine initially admitted to the ICU 2D echo showed an EF of 25% he was taken to the cardiac Cath Lab after cardiology consultation and he status post DES to the LAD  Significant Events: 9/22 LHC: Ost LAD to Prox LAD 99% stenosed, mid LA 30% stenosed  DES placed with 0% residual stenosis  Moderate pulm hypertension EF 25% with anterior akinesis on echo immediately following  PCI  9/22 started on milrinone.  03/17/2019 repeat a 2D echo showed an EF of 40% with demonstrated regional wall motion abnormality, of the left ventricular mid apical anterior wall and apical segment.      Hospital Course:   VT/VF arrest Acute cardiogenic shock  STEMI Acute systolic heart failure: -Out of hospital VT/VF arrest, s/p CPR and chest compressions, 4 defibrillations before ROSC -Noted to have anterior STEMI on arrival, Status postcardiac cath with DES stent placement to the LAD.  Initial echo showed an EF of 20% with akinesia, material wall pain index -Treated with IV amiodarone and milrinone initially, weaned off -Currently stable and euvolemic, continue Entresto, Aldactone, carvedilol, dapagliflozin -Continue aspirin, ticagrelor, statin -2D echo showed an EF of 40% with wall motion abnormality. -Clinically improving and stable from this standpoint, discharged home in a stable condition with heart failure team follow-up, needs BMP check in 1 week, KCl dose decreased to 40 M EQ daily since he will be on Entresto and Aldactone as well   Acute respiratory failure with hypoxia: Secondary to cardiogenic shock in the setting of anterior MI. PCCM consulted intubated eventually extubated on 03/12/2021.   Hemoptysis -Likely secondary to CPR, CT chest noted groundglass opacities in both upper lobes could reflect pulmonary hemorrhage -Continues to have scant hemoptysis, continue aspirin and ticagrelor -Discussed with pulmonary, recommended supportive care -This is improving   CAD with an anterior STEMI: S/p cardiac cath with a DES to the LAD, on DAPT therapy. -Stable, continue aspirin, ticagrelor, Coreg, statin  Ischemic bowel: Likely due to cardiogenic shock confirmed on CT. -Resolved, completed antibiotic  course for this   Acute kidney injury/possibly ATN: Likely due to shock, uncertain of baseline on admission was 1.5. Improved with treatment of shock now 1.6.  Shock  liver: Slowly improving.   Electrolyte imbalance/Hypokalemia: -Repleted   Procedures  Left heart cath 9/22   Ost LAD to Prox LAD lesion is 99% stenosed.   Mid LAD lesion is 30% stenosed.   A drug-eluting stent was successfully placed using a STENT ONYX FRONTIER [id number].   Post intervention, there is a 0% residual stenosis.   LV end diastolic pressure is severely elevated.   Hemodynamic findings consistent with moderate pulmonary hypertension.   There is no aortic valve stenosis.  SUMMARY: Ischemic Cardiac Arrest-Ventricular Tachycardia-V. fib Severe single-vessel CAD with ostial LAD 95 to 99% thrombotic stenosis and roughly 30% mid LAD.  TIMI I flow Successful DES PCI of the ostial LAD with Onyx Frontier 3.0 mm x 18 mm postdilated to 3.3 mm -> reducing stenosis to 0% with TIMI-3 flow. ACUTE DIASTOLIC HEART FAILURE with opening LVEDP of 45 mmHg-PCWP post PCI and Lasix reduced to 28 mmHg. Moderate pulmonary hypertension with PA pressures 46/30 mmHg - mean 38 mmHg. Ao sat 98%, PA sat 76%. Cardiac Output-Index: (Fick) 5.96-2.96; (thermal) 5.55-2.76. Successful defibrillation x2-on 2 occasions crossing into the left ventricle, the patient decompensated into polymorphic VT on 1 occasion and VF on another.  Both successfully defibrillated, no CPR required.     Discharge Exam: Vitals:   03/19/21 1006 03/19/21 1127  BP: 131/72 116/74  Pulse: 95 74  Resp:  18  Temp:  98.4 F (36.9 C)  SpO2:      General: AAOx3 Cardiovascular: S1S2/RRR Respiratory: CTAB  Discharge Instructions   Discharge Instructions     Amb Referral to Cardiac Rehabilitation   Complete by: As directed    Diagnosis:  Coronary Stents STEMI     After initial evaluation and assessments completed: Virtual Based Care may be provided alone or in conjunction with Phase 2 Cardiac Rehab based on patient barriers.: Yes   Diet - low sodium heart healthy   Complete by: As directed    Increase activity slowly    Complete by: As directed       Allergies as of 03/19/2021   No Known Allergies      Medication List     STOP taking these medications    amLODipine 10 MG tablet Commonly known as: NORVASC   hydrochlorothiazide 25 MG tablet Commonly known as: HYDRODIURIL       TAKE these medications    aspirin 81 MG EC tablet Take 1 tablet (81 mg total) by mouth daily. Swallow whole.   atorvastatin 80 MG tablet Commonly known as: LIPITOR Take 1 tablet (80 mg total) by mouth daily.   Brilinta 90 MG Tabs tablet Generic drug: ticagrelor Take 1 tablet (90 mg total) by mouth 2 (two) times daily.   carvedilol 6.25 MG tablet Commonly known as: COREG Take 1 tablet (6.25 mg total) by mouth 2 (two) times daily with a meal.   Entresto 97-103 MG Generic drug: sacubitril-valsartan Take 1 tablet by mouth 2 (two) times daily.   Farxiga 10 MG Tabs tablet Generic drug: dapagliflozin propanediol Take 1 tablet (10 mg total) by mouth daily. Start taking on: March 20, 2021   potassium chloride SA 20 MEQ tablet Commonly known as: KLOR-CON Take 2 tablets (40 mEq total) by mouth daily.   spironolactone 25 MG tablet Commonly known as: ALDACTONE Take 1 tablet (25 mg total) by mouth  daily. Start taking on: March 20, 2021       No Known Allergies  Follow-up Information     Grayce Sessions, NP Follow up.   Specialty: Internal Medicine Contact information: [address] [place] [address] [phone]         [facility] Follow up.   Specialty: Cardiology Why: 03/26/21 10:00 AM [facility], [address] Garage Code [phone] Contact information: [address] [id number] [place] [address] [phone]                 The results of significant diagnostics from this hospitalization (including imaging, microbiology, ancillary and laboratory)  are listed below for reference.    Significant Diagnostic Studies: CT ABDOMEN PELVIS WO CONTRAST  Result Date: 03/13/2021 CLINICAL DATA:  Abdominal distension EXAM: CT ABDOMEN AND PELVIS WITHOUT CONTRAST TECHNIQUE: Multidetector CT imaging of the abdomen and pelvis was performed following the standard protocol without IV contrast. COMPARISON:  None. FINDINGS: Lower chest: Dependent bibasilar atelectasis.  Heart size is normal. Hepatobiliary: Unremarkable unenhanced appearance of the liver. No focal liver lesion identified. Hyperdense material within the gallbladder lumen likely related to excreted contrast. No pericholecystic inflammatory changes are evident. No discrete gallstone. No biliary dilatation. Pancreas: Unremarkable. No pancreatic ductal dilatation or surrounding inflammatory changes. Spleen: Normal in size without focal abnormality. Adrenals/Urinary Tract: Unremarkable adrenal glands. Small left renal cyst. Kidneys are otherwise unremarkable. No renal stone or hydronephrosis. Bladder wall is mildly thickened. Excreted contrast is noted within the bladder. Stomach/Bowel: Small hiatal hernia. Stomach otherwise within normal limits. There is a small amount of contrast present within the bowel. No dilated loops of bowel to suggest obstruction. Long segment wall thickening and probable mucosal edema within the distal sigmoid colon and rectum. No pericolonic inflammatory changes. Vascular/Lymphatic: Scattered aortoiliac atherosclerotic calcifications without aneurysm. No abdominopelvic lymphadenopathy. Right femoral approach central venous catheter present. Reproductive: Prostate is unremarkable. Other: No free fluid. No abdominopelvic fluid collection. No pneumoperitoneum. No abdominal wall hernia. Musculoskeletal: No acute or significant osseous findings. IMPRESSION: 1. Long segment wall thickening and probable mucosal edema within the distal sigmoid colon and rectum suggestive of an infectious or  inflammatory colitis. No evidence of bowel obstruction. 2. Mildly thickened urinary bladder wall. Correlate with urinalysis to exclude cystitis. 3. Small hiatal hernia. Aortic Atherosclerosis (ICD10-I70.0). Electronically Signed   By: Duanne Guess D.O.   On: 03/13/2021 14:17   DG Chest 2 View  Result Date: 03/16/2021 CLINICAL DATA:  Hemoptysis EXAM: CHEST - 2 VIEW COMPARISON:  Chest x-ray dated March 13, 2021 FINDINGS: Interval placement of right arm PICC with tip positioned over the expected area of the lower SVC. Interval removal of PA catheter. Slightly decreased right-greater-than-left upper lung heterogeneous opacities. No evidence of pleural effusion or pneumothorax. IMPRESSION: Right arm PICC line tip is positioned over the expected area of the lower SVC. Slightly decreased right-greater-than-left upper lung heterogeneous opacities. Electronically Signed   By: Allegra Lai M.D.   On: 03/16/2021 13:22   CT CHEST WO CONTRAST  Result Date: 03/18/2021 CLINICAL DATA:  Hemoptysis EXAM: CT CHEST WITHOUT CONTRAST TECHNIQUE: Multidetector CT imaging of the chest was performed following the standard protocol without IV contrast. COMPARISON:  Chest x-ray 03/16/2021 FINDINGS: Cardiovascular: Coronary artery and aortic calcifications. No aneurysm. Heart is normal size. Mediastinum/Nodes: No mediastinal, hilar, or axillary adenopathy. Trachea and esophagus are unremarkable. Thyroid unremarkable. Lungs/Pleura: Centrilobular and paraseptal emphysema. Ground-glass opacities  in both upper lobes, right slightly greater than left. Mid and lower lung fields are clear. No effusions. Upper Abdomen: Imaging into the upper abdomen demonstrates no acute findings. Musculoskeletal: Chest wall soft tissues are unremarkable. No acute bony abnormality. IMPRESSION: Coronary artery disease. Ground-glass opacities in the upper lobes bilaterally. This could reflect pulmonary hemorrhage or atypical/viral pneumonia. Aortic  Atherosclerosis (ICD10-I70.0) and Emphysema (ICD10-J43.9). Electronically Signed   By: Charlett Nose M.D.   On: 03/18/2021 13:11   CARDIAC CATHETERIZATION  Result Date: 03/12/2021   Ost LAD to Prox LAD lesion is 99% stenosed.   Mid LAD lesion is 30% stenosed.   A drug-eluting stent was successfully placed using a STENT ONYX FRONTIER [id number].   Post intervention, there is a 0% residual stenosis.   LV end diastolic pressure is severely elevated.   Hemodynamic findings consistent with moderate pulmonary hypertension.   There is no aortic valve stenosis. SUMMARY: Ischemic Cardiac Arrest-Ventricular Tachycardia-V. fib Severe single-vessel CAD with ostial LAD 95 to 99% thrombotic stenosis and roughly 30% mid LAD.  TIMI I flow Successful DES PCI of the ostial LAD with Onyx Frontier 3.0 mm x 18 mm postdilated to 3.3 mm -> reducing stenosis to 0% with TIMI-3 flow. ACUTE DIASTOLIC HEART FAILURE with opening LVEDP of 45 mmHg-PCWP post PCI and Lasix reduced to 28 mmHg. Moderate pulmonary hypertension with PA pressures 46/30 mmHg - mean 38 mmHg. Ao sat 98%, PA sat 76%. Cardiac Output-Index: (Fick) 5.96-2.96; (thermal) 5.55-2.76. Successful defibrillation x2-on 2 occasions crossing into the left ventricle, the patient decompensated into polymorphic VT on 1 occasion and VF on another.  Both successfully defibrillated, no CPR required. RECOMMENDATIONS Admit to CCU with PCCM managing vent. Monitor for Neurologic Recovery Consider low-dose nitroglycerin infusion for hypertension along with beta-blocker Continue lidocaine for 18 hours, then discontinue Continue amiodarone infusion per protocol and then convert to oral after 24-hour infusion. Continue bicarb drip to be managed by PCCM. Uninterrupted DAPT x 12 months   DG Chest Port 1 View  Result Date: 03/13/2021 CLINICAL DATA:  History of cardiac arrest EXAM: PORTABLE CHEST 1 VIEW COMPARISON:  Chest x-ray dated March 12, 2021 FINDINGS: Interval extubation and removal of  enteric tube. Inferior approach PA catheter is unchanged in position. Right greater than left upper lung heterogeneous opacities are decreased compared to prior exam. No large pleural effusion or evidence of pneumothorax. Cardiac and mediastinal contours are unchanged. IMPRESSION: Decreased right greater than left upper lung heterogeneous opacities. Electronically Signed   By: Allegra Lai M.D.   On: 03/13/2021 09:23   Port CXR  Result Date: 03/12/2021 CLINICAL DATA:  Cardiac arrest. EXAM: PORTABLE CHEST 1 VIEW COMPARISON:  March 11, 2021. FINDINGS: The heart size and mediastinal contours are within normal limits. Endotracheal and nasogastric tubes are unchanged in position. Bilateral upper lobe opacities are noted, right greater than left, concerning for pneumonia or possibly edema. The visualized skeletal structures are unremarkable. IMPRESSION: Bilateral upper lobe airspace opacities are again noted, right greater than left, concerning for pneumonia or edema. Stable support apparatus. Electronically Signed   By: Lupita Raider M.D.   On: 03/12/2021 08:57   DG Chest Portable 1 View  Result Date: 03/11/2021 CLINICAL DATA:  Check endotracheal tube placement, unresponsive on way to ER, recent CPR performed EXAM: PORTABLE CHEST 1 VIEW COMPARISON:  None. FINDINGS: Cardiac shadow is within normal limits. Endotracheal tube and gastric catheter are noted. The proximal side port of the gastric catheter is in the distal esophagus. This should be  advanced several cm. Diffuse bilateral airspace opacity is noted worst in the right upper lobe likely related to underlying edema. No bony abnormality is seen. IMPRESSION: Diffuse airspace opacity bilaterally worse on the right likely related underlying edema. Endotracheal tube in satisfactory position. Gastric catheter shows proximal side port within the distal esophagus. This should be advanced several cm deeper into the stomach. Electronically Signed   By: Alcide Clever M.D.   On: 03/11/2021 23:03   DG Abd Portable 1V  Result Date: 03/12/2021 CLINICAL DATA:  Cardiac arrest. EXAM: PORTABLE ABDOMEN - 1 VIEW COMPARISON:  None. FINDINGS: The bowel gas pattern is normal. No radio-opaque calculi are seen. Radiopaque contrast is noted within the urinary bladder. IMPRESSION: Negative. Electronically Signed   By: Aram Candela M.D.   On: 03/12/2021 20:08   ECHOCARDIOGRAM COMPLETE  Result Date: 03/12/2021    ECHOCARDIOGRAM REPORT   Patient Name:   RAHSHAWN REMO Date of Exam: 03/12/2021 Medical Rec #:  [id number]       Height:       69.0 in Accession #:    [id number]      Weight:       193.8 lb Date of Birth:  11/24/1950        BSA:          2.038 m Patient Age:    65 years        BP:           159/86 mmHg Patient Gender: M               HR:           51 bpm. Exam Location:  Inpatient Procedure: 2D Echo, Cardiac Doppler and Color Doppler Indications:    Acute myocardial infarction, unspecified I21.9  History:        Patient has no prior history of Echocardiogram examinations.  Sonographer:    Eulah Pont RDCS Referring Phys: [phone] DAVID W HARDING IMPRESSIONS  1. Akinesis of the anteroseptal wall and apex with overall severe LV dysfunction.  2. Left ventricular ejection fraction, by estimation, is 20 to 25%. The left ventricle has severely decreased function. The left ventricle demonstrates regional wall motion abnormalities (see scoring diagram/findings for description). There is severe left ventricular hypertrophy. Left ventricular diastolic parameters are consistent with Grade II diastolic dysfunction (pseudonormalization). Elevated left atrial pressure.  3. Right ventricular systolic function is normal. The right ventricular size is normal.  4. The mitral valve is normal in structure. No evidence of mitral valve regurgitation. No evidence of mitral stenosis.  5. The aortic valve is tricuspid. Aortic valve regurgitation is not visualized. No aortic stenosis is present.   6. The inferior vena cava is normal in size with greater than 50% respiratory variability, suggesting right atrial pressure of 3 mmHg. FINDINGS  Left Ventricle: Left ventricular ejection fraction, by estimation, is 20 to 25%. The left ventricle has severely decreased function. The left ventricle demonstrates regional wall motion abnormalities. The left ventricular internal cavity size was normal  in size. There is severe left ventricular hypertrophy. Left ventricular diastolic parameters are consistent with Grade II diastolic dysfunction (pseudonormalization). Elevated left atrial pressure. Right Ventricle: The right ventricular size is normal. Right ventricular systolic function is normal. Left Atrium: Left atrial size was normal in size. Right Atrium: Right atrial size was normal in size. Pericardium: There is no evidence of pericardial effusion. Mitral Valve: The mitral valve is normal in structure. No evidence of mitral valve regurgitation. No evidence  of mitral valve stenosis. Tricuspid Valve: The tricuspid valve is normal in structure. Tricuspid valve regurgitation is trivial. No evidence of tricuspid stenosis. Aortic Valve: The aortic valve is tricuspid. Aortic valve regurgitation is not visualized. No aortic stenosis is present. Pulmonic Valve: The pulmonic valve was normal in structure. Pulmonic valve regurgitation is not visualized. No evidence of pulmonic stenosis. Aorta: The aortic root is normal in size and structure. Venous: The inferior vena cava is normal in size with greater than 50% respiratory variability, suggesting right atrial pressure of 3 mmHg. IAS/Shunts: No atrial level shunt detected by color flow Doppler. Additional Comments: Akinesis of the anteroseptal wall and apex with overall severe LV dysfunction. A venous catheter is visualized.  LEFT VENTRICLE PLAX 2D LVIDd:         4.20 cm      Diastology LVIDs:         3.50 cm      LV e' medial:    3.32 cm/s LV PW:         1.20 cm      LV E/e'  medial:  19.9 LV IVS:        1.10 cm      LV e' lateral:   3.60 cm/s LVOT diam:     2.00 cm      LV E/e' lateral: 18.3 LV SV:         26 LV SV Index:   13 LVOT Area:     3.14 cm  LV Volumes (MOD) LV vol d, MOD A2C: 78.1 ml LV vol d, MOD A4C: 117.0 ml LV vol s, MOD A2C: 64.7 ml LV vol s, MOD A4C: 75.6 ml LV SV MOD A2C:     13.4 ml LV SV MOD A4C:     117.0 ml LV SV MOD BP:      27.7 ml RIGHT VENTRICLE RV S prime:     7.93 cm/s TAPSE (M-mode): 1.4 cm LEFT ATRIUM             Index       RIGHT ATRIUM           Index LA diam:        2.70 cm 1.32 cm/m  RA Area:     12.50 cm LA Vol (A2C):   22.3 ml 10.94 ml/m RA Volume:   33.50 ml  16.43 ml/m LA Vol (A4C):   24.0 ml 11.77 ml/m LA Biplane Vol: 24.0 ml 11.77 ml/m  AORTIC VALVE LVOT Vmax:   57.20 cm/s LVOT Vmean:  35.600 cm/s LVOT VTI:    0.082 m  AORTA Ao Root diam: 3.20 cm Ao Asc diam:  2.90 cm MITRAL VALVE MV Area (PHT): 7.29 cm    SHUNTS MV Decel Time: 104 msec    Systemic VTI:  0.08 m MV E velocity: 66.00 cm/s  Systemic Diam: 2.00 cm MV A velocity: 38.40 cm/s MV E/A ratio:  1.72 Olga Millers MD Electronically signed by Olga Millers MD Signature Date/Time: 03/12/2021/12:18:05 PM    Final    ECHOCARDIOGRAM LIMITED  Result Date: 03/16/2021    ECHOCARDIOGRAM LIMITED REPORT   Patient Name:   ELVA BREAKER Date of Exam: 03/16/2021 Medical Rec #:  [id number]       Height:       69.0 in Accession #:    [id number]      Weight:       178.2 lb Date of Birth:  08-May-1956        BSA:  1.967 m Patient Age:    65 years        BP:           133/88 mmHg Patient Gender: M               HR:           78 bpm. Exam Location:  Inpatient Procedure: Limited Echo and Color Doppler Indications:    Cardiomyopathy-ischemic  History:        Patient has prior history of Echocardiogram examinations, most                 recent 03/12/2021. Acute MI; Risk Factors:Current Smoker.  Sonographer:    Ross Ludwig RDCS (AE) Referring Phys: [id number] Lynnell Catalan  Sonographer Comments: Patient  had bandages in parasternal window area. IMPRESSIONS  1. Limited echo for LVEF and regional wall motion.  2. Left ventricular ejection fraction, by estimation, is 40 to 45%. Left ventricular ejection fraction by 2D MOD biplane is 39.8 %. The left ventricle has mildly decreased function. The left ventricle demonstrates regional wall motion abnormalities (see  scoring diagram/findings for description). There is moderate hypokinesis of the left ventricular, mid-apical anterior wall and apical segment. Comparison(s): Changes from prior study are noted. 03/12/2021: LVEF 20-25%. FINDINGS  Left Ventricle: Left ventricular ejection fraction, by estimation, is 40 to 45%. Left ventricular ejection fraction by 2D MOD biplane is 39.8 %. The left ventricle has mildly decreased function. The left ventricle demonstrates regional wall motion abnormalities. Moderate hypokinesis of the left ventricular, mid-apical anterior wall and apical segment.  LV Wall Scoring: The mid and distal anterior septum, apical anterior segment, and apex are hypokinetic. The anterior wall, entire lateral wall, entire inferior wall, basal anteroseptal segment, mid inferoseptal segment, and basal inferoseptal segment are normal.  LV Volumes (MOD)               Biplane EF (MOD) LV vol d, MOD    104.0 ml      LV Biplane EF:   Left A2C:                                            ventricular LV vol d, MOD    93.8 ml                        ejection A4C:                                            fraction by LV vol s, MOD    53.9 ml                        2D MOD A2C:                                            biplane is LV vol s, MOD    68.4 ml                        39.8 %. A4C: LV SV MOD A2C:   50.1 ml LV SV MOD A4C:  93.8 ml LV SV MOD BP:    40.0 ml Zoila Shutter MD Electronically signed by Zoila Shutter MD Signature Date/Time: 03/16/2021/5:29:54 PM    Final    [place] EKG SITE RITE  Result Date: 03/14/2021 If Site Rite image not attached, placement could not  be confirmed due to current cardiac rhythm.   Microbiology: Recent Results (from the past 240 hour(s))  Resp Panel by RT-PCR (Flu A&B, Covid) Nasopharyngeal Swab     Status: None   Collection Time: 03/11/21 10:49 PM   Specimen: Nasopharyngeal Swab; Nasopharyngeal(NP) swabs in vial transport medium  Result Value Ref Range Status   SARS Coronavirus 2 by RT PCR NEGATIVE NEGATIVE Final    Comment: (NOTE) SARS-CoV-2 target nucleic acids are NOT DETECTED.  The SARS-CoV-2 RNA is generally detectable in upper respiratory specimens during the acute phase of infection. The lowest concentration of SARS-CoV-2 viral copies this assay can detect is 138 copies/mL. A negative result does not preclude SARS-Cov-2 infection and should not be used as the sole basis for treatment or other patient management decisions. A negative result may occur with  improper specimen collection/handling, submission of specimen other than nasopharyngeal swab, presence of viral mutation(s) within the areas targeted by this assay, and inadequate number of viral copies(<138 copies/mL). A negative result must be combined with clinical observations, patient history, and epidemiological information. The expected result is Negative.  Fact Sheet for Patients:  BloggerCourse.com  Fact Sheet for Healthcare Providers:  SeriousBroker.it  This test is no t yet approved or cleared by the [place] FDA and  has been authorized for detection and/or diagnosis of SARS-CoV-2 by FDA under an Emergency Use Authorization (EUA). This EUA will remain  in effect (meaning this test can be used) for the duration of the COVID-19 declaration under Section 564(b)(1) of the Act, 21 U.S.C.section 360bbb-3(b)(1), unless the authorization is terminated  or revoked sooner.       Influenza A by PCR NEGATIVE NEGATIVE Final   Influenza B by PCR NEGATIVE NEGATIVE Final    Comment: (NOTE) The  Xpert Xpress SARS-CoV-2/FLU/RSV plus assay is intended as an aid in the diagnosis of influenza from Nasopharyngeal swab specimens and should not be used as a sole basis for treatment. Nasal washings and aspirates are unacceptable for Xpert Xpress SARS-CoV-2/FLU/RSV testing.  Fact Sheet for Patients: BloggerCourse.com  Fact Sheet for Healthcare Providers: SeriousBroker.it  This test is not yet approved or cleared by the [place] FDA and has been authorized for detection and/or diagnosis of SARS-CoV-2 by FDA under an Emergency Use Authorization (EUA). This EUA will remain in effect (meaning this test can be used) for the duration of the COVID-19 declaration under Section 564(b)(1) of the Act, 21 U.S.C. section 360bbb-3(b)(1), unless the authorization is terminated or revoked.  Performed at [facility], [address] [place], [place] [address]   Culture, Respiratory w Gram Stain (tracheal aspirate)     Status: None   Collection Time: 03/12/21  2:09 AM   Specimen: Tracheal Aspirate; Respiratory  Result Value Ref Range Status   Specimen Description TRACHEAL ASPIRATE  Final   Special Requests NONE  Final   Gram Stain   Final    FEW SQUAMOUS EPITHELIAL CELLS PRESENT FEW WBC SEEN FEW GRAM POSITIVE COCCI    Culture   Final    FEW Normal respiratory flora-no Staph aureus or Pseudomonas seen Performed at [facility], [address], [place], [place] [address]    Report Status 03/14/2021  FINAL  Final  MRSA Next Gen by PCR, Nasal     Status: None   Collection Time: 03/12/21  2:09 AM   Specimen: Nasal Mucosa; Nasal Swab  Result Value Ref Range Status   MRSA by PCR Next Gen NOT DETECTED NOT DETECTED Final    Comment: (NOTE) The GeneXpert MRSA Assay (FDA approved for NASAL specimens only), is one component of a comprehensive MRSA colonization surveillance program. It is not intended to diagnose MRSA infection nor to  guide or monitor treatment for MRSA infections. Test performance is not FDA approved in patients less than 31 years old. Performed at [facility], [address] [place], [place] [address]      Labs: Basic Metabolic Panel: Recent Labs  Lab 03/13/21 0325 03/13/21 1445 03/14/21 0306 03/15/21 0450 03/15/21 0838 03/16/21 0505 03/17/21 0500 03/18/21 0500 03/19/21 0649  NA 134*   < > 136  --  133* 136 134* 136 138  K 3.8   < > 3.7  --  3.4* 3.1* 3.1* 3.3* 4.3  CL 99   < > 105  --  101 103 104 107 110  CO2 26   < > 24  --  24 25 22  21* 19*  GLUCOSE 138*   < > 110*  --  175* 122* 88 96 95  BUN 21   < > 15  --  20 22 20 16 18   CREATININE 2.06*   < > 1.84*  --  1.68* 1.70* 1.63* 1.52* 1.73*  CALCIUM 7.7*   < > 8.0*  --  8.1* 8.4* 8.4* 8.4* 8.8*  MG 2.3  --  1.9 2.0  --   --  1.9 1.8  --    < > = values in this interval not displayed.   Liver Function Tests: Recent Labs  Lab 03/13/21 0325 03/16/21 0505  AST 598* 146*  ALT 340* 112*  ALKPHOS 63 47  BILITOT 0.7 1.4*  PROT 5.7* 6.2*  ALBUMIN 2.6* 2.5*   No results for input(s): LIPASE, AMYLASE in the last 168 hours. No results for input(s): AMMONIA in the last 168 hours. CBC: Recent Labs  Lab 03/15/21 0450 03/16/21 0505 03/17/21 0500 03/18/21 0500 03/19/21 0649  WBC 17.9* 13.6* 13.0* 11.7* 11.4*  HGB 13.5 13.0 12.0* 13.0 12.0*  HCT 39.4 37.3* 35.2* 37.7* 35.9*  MCV 95.6 94.7 95.4 94.5 96.2  PLT 137* 153 184 199 284   Cardiac Enzymes: No results for input(s): CKTOTAL, CKMB, CKMBINDEX, TROPONINI in the last 168 hours. BNP: BNP (last 3 results) No results for input(s): BNP in the last 8760 hours.  ProBNP (last 3 results) No results for input(s): PROBNP in the last 8760 hours.  CBG: Recent Labs  Lab 03/18/21 2102 03/19/21 0014 03/19/21 0615 03/19/21 0725 03/19/21 1125  GLUCAP 124* 105* 105* 93 182*       Signed:  Zannie Cove MD.  [facility] 03/19/2021, 4:07 PM

## 2021-03-19 NOTE — Progress Notes (Signed)
CARDIAC REHAB PHASE I   Pt with d/c order. Pt denies questions or concerns. Anxious to go home. PICC still in place, no order to remove. RN made aware. Pt states wife needs to work at 3p.  [id number] Reynold Bowen, RN BSN 03/19/2021 11:54 AM

## 2021-03-20 ENCOUNTER — Telehealth: Payer: Self-pay

## 2021-03-20 NOTE — Telephone Encounter (Signed)
Transition Care Management Follow-up Telephone Call   Date of discharge and from where:[facility] on 03/19/2021 How have you been since you were released from the hospital? Feeling much better today eating some some soup now/ daughter next by [facility] as per pt   Any questions or concerns? No questions/concerns reported.  Items Reviewed: Did the pt receive and understand the discharge instructions provided? have the instructions and have no questions.  Medications obtained and verified? She said that they have the medication list  and the hospital staff reviewed them in detail prior to discharge. She said that he has all of the medications and both pt and daughter have no questions.  Any new allergies since your discharge? None reported  Do you have support at home? Yes, daughter Other (ie: DME, Home Health, etc)       NA Functional Questionnaire: (I = Independent and D = Dependent) ADL's:  Independent.        Follow up appointments reviewed:   PCP Hospital f/u appt confirmed? NP Gwinda Passe on 04/02/2021@ 11:25.  Specialist Hospital f/u appt confirmed? scheduled at this time  Are transportation arrangements needed? have transportation   If their condition worsens, is the pt aware to call  their PCP or go to the ED? Yes.Made pt aware if condition worsen or start experiencing rapid weight gain, chest pain, diff breathing, SOB, high fevers, or bleading to refer imediately to ED for further evaluation.  Was the patient provided with contact information for the PCP's office or ED? He has the phone number  Was the pt encouraged to call back with questions or concerns?yes

## 2021-03-23 ENCOUNTER — Other Ambulatory Visit: Payer: Self-pay

## 2021-03-23 ENCOUNTER — Telehealth: Payer: Self-pay

## 2021-03-23 NOTE — Telephone Encounter (Signed)
Transition Care Management Follow-up Telephone Call   Date of discharge and from where:[facility] on 03/19/2021 How have you been since you were released from the hospital? Better now.   Any questions or concerns? PT stated having some skin itchiness and crazy dreams concerns reported. New meds added when discharged are [place] and Spironolactone .Denies any thoughts of harming himself or other.  Will routed to PCP for advice  Items Reviewed: Did the pt receive and understand the discharge instructions provided? has the instructions and have no questions.  Medications obtained and verified? He said that he have the medication list  and the hospital staff reviewed them in detail prior to discharge. He said that he has all of the medications and  have no questions.  Any new allergies since your discharge? None reported  Do you have support at home? Yes, son and wife Other (ie: DME, Home Health, etc)     [facility]HS   Functional Questionnaire: (I = Independent and D = Dependent) ADL's:  Independent.        Follow up appointments reviewed:  PCP Hospital f/u appt confirmed? NP Edwards on 04/02/2021 @ 11:25 am Specialist Hospital f/u appt confirmed? scheduled at this time  Are transportation arrangements needed? have transportation   If their condition worsens, is the pt aware to call  their PCP or go to the ED? Yes.Made pt aware if condition worsen or start experiencing rapid weight gain, chest pain, diff breathing, SOB, high fevers, or bleading to refer imediately to ED for further evaluation.  Was the patient provided with contact information for the PCP's office or ED? He has the phone number  Was the pt encouraged to call back with questions or concerns?yes

## 2021-03-24 ENCOUNTER — Telehealth (HOSPITAL_COMMUNITY): Payer: Self-pay

## 2021-03-24 NOTE — Telephone Encounter (Signed)
Called patient to see if he is interested in the Cardiac Rehab Program. Patient's wife stated that pt expressed interest in virtual cardiac rehab only. Explained scheduling process. Patient verbalized understanding. Will contact patient for scheduling once f/u has been completed.

## 2021-03-26 ENCOUNTER — Encounter (HOSPITAL_COMMUNITY): Payer: Self-pay

## 2021-03-30 ENCOUNTER — Ambulatory Visit (INDEPENDENT_AMBULATORY_CARE_PROVIDER_SITE_OTHER): Payer: Self-pay | Admitting: *Deleted

## 2021-03-30 NOTE — Telephone Encounter (Signed)
Wife calling for patient. Recent hx of MI about 2 weeks ago. C/O intermittent CP center of chest -from 5-15 minutes with/without activity-not at the moment but earlier, sometimes once daily sometimes more often. Wife reported he was not prescribed NTG SL. Denies SOB/Dizziness/Sweating/N/V/diarrhea. Both arms having cramping daily. Given the patient's history, advised ED at this time for evaluation. Patient and wife agree. Follow-up appointment with pcp on 04/02/21.   Reason for Disposition  [1] Chest pain (or "angina") comes and goes AND [2] is happening more often (increasing in frequency) or getting worse (increasing in severity) (Exception: chest pains that last only a few seconds)  Answer Assessment - Initial Assessment Questions 1. LOCATION: "Where does it hurt?"       Right and left arms are cramping. Like a stiffness. Also, center of chest pain 2. RADIATION: "Does the pain go anywhere else?" (e.g., into neck, jaw, arms, back)     no 3. ONSET: "When did the chest pain begin?" (Minutes, hours or days)      Yesterday, intermittently  4. PATTERN "Does the pain come and go, or has it been constant since it started?"  "Does it get worse with exertion?"  Comes and goes 5. DURATION: "How long does it last" (e.g., seconds, minutes, hours)     Hurts with sitting, standing or walking sometimes 5-15 minutes 6. SEVERITY: "How bad is the pain?"  (e.g., Scale 1-10; mild, moderate, or severe)    - MILD (1-3): doesn't interfere with normal activities     - MODERATE (4-7): interferes with normal activities or awakens from sleep    - SEVERE (8-10): excruciating pain, unable to do any normal activities       Not hurting at this time. 7. CARDIAC RISK FACTORS: "Do you have any history of heart problems or risk factors for heart disease?" (e.g., angina, prior heart attack; diabetes, high blood pressure, high cholesterol, smoker, or strong family history of heart disease)     Yes, recent MI 8. PULMONARY RISK  FACTORS: "Do you have any history of lung disease?"  (e.g., blood clots in lung, asthma, emphysema, birth control pills)     no 9. CAUSE: "What do you think is causing the chest pain?"     unsure 10. OTHER SYMPTOMS: "Do you have any other symptoms?" (e.g., dizziness, nausea, vomiting, sweating, fever, difficulty breathing, cough)       None of these 11. PREGNANCY: "Is there any chance you are pregnant?" "When was your last menstrual period?"       na  Protocols used: Chest Pain-A-AH

## 2021-03-30 NOTE — Telephone Encounter (Signed)
FYI

## 2021-03-30 NOTE — Telephone Encounter (Signed)
Advanced Heart Failure Patient Advocate Encounter   Patient was approved to receive Entresto from [employer]  Patient ID: [id number] Effective dates: 03/27/21 through 03/27/22  Advanced Heart Failure Patient Advocate Encounter   Patient was approved to receive Brilinta from [employer]  Patient ID: [id number] Effective dates: 03/20/2021 through 03/20/2022  Attempted to call the patient several times. No vm set up.  Archer Asa, CPhT

## 2021-04-02 ENCOUNTER — Ambulatory Visit (INDEPENDENT_AMBULATORY_CARE_PROVIDER_SITE_OTHER): Payer: Self-pay | Admitting: Primary Care

## 2021-04-02 ENCOUNTER — Ambulatory Visit (INDEPENDENT_AMBULATORY_CARE_PROVIDER_SITE_OTHER): Payer: Self-pay

## 2021-04-02 NOTE — Telephone Encounter (Signed)
Please advise. Patient scheduled for appt on 10/31

## 2021-04-02 NOTE — Telephone Encounter (Signed)
ED is appropriate to refer . He is schedule for first available appt. Will ask secretary to place on wait list

## 2021-04-02 NOTE — Telephone Encounter (Signed)
Pt. Complaining of shortness of breath with exertion and conversation. Not sleeping well at night. States he has chest pain that comes and goes. No availability in the practice. Instructed pt. To go to ED. Verbalizes understanding. Has appointment for follow up. Requests to be seen sooner if possible.   Reason for Disposition  Patient sounds very sick or weak to the triager  Answer Assessment - Initial Assessment Questions 1. RESPIRATORY STATUS: "Describe your breathing?" (e.g., wheezing, shortness of breath, unable to speak, severe coughing)      Shortness of breath 2. ONSET: "When did this breathing problem begin?"      Since the hospital 3. PATTERN "Does the difficult breathing come and go, or has it been constant since it started?"      With exertion 4. SEVERITY: "How bad is your breathing?" (e.g., mild, moderate, severe)    - MILD: No SOB at rest, mild SOB with walking, speaks normally in sentences, can lie down, no retractions, pulse < 100.    - MODERATE: SOB at rest, SOB with minimal exertion and prefers to sit, cannot lie down flat, speaks in phrases, mild retractions, audible wheezing, pulse 100-120.    - SEVERE: Very SOB at rest, speaks in single words, struggling to breathe, sitting hunched forward, retractions, pulse > 120      Mild 5. RECURRENT SYMPTOM: "Have you had difficulty breathing before?" If Yes, ask: "When was the last time?" and "What happened that time?"      Yes 6. CARDIAC HISTORY: "Do you have any history of heart disease?" (e.g., heart attack, angina, bypass surgery, angioplasty)      Yes 7. LUNG HISTORY: "Do you have any history of lung disease?"  (e.g., pulmonary embolus, asthma, emphysema)     No 8. CAUSE: "What do you think is causing the breathing problem?"      Unsure 9. OTHER SYMPTOMS: "Do you have any other symptoms? (e.g., dizziness, runny nose, cough, chest pain, fever)     Not sleeping well 10. O2 SATURATION MONITOR:  "Do you use an oxygen saturation  monitor (pulse oximeter) at home?" If Yes, "What is your reading (oxygen level) today?" "What is your usual oxygen saturation reading?" (e.g., 95%)       No 11. PREGNANCY: "Is there any chance you are pregnant?" "When was your last menstrual period?"       N/A 12. TRAVEL: "Have you traveled out of the country in the last month?" (e.g., travel history, exposures)       No  Protocols used: Breathing Difficulty-A-AH

## 2021-04-06 ENCOUNTER — Telehealth (HOSPITAL_COMMUNITY): Payer: Self-pay | Admitting: Pharmacist

## 2021-04-06 NOTE — Telephone Encounter (Signed)
Pharmacy Transitions of Care Follow-up Telephone Call  Date of discharge: 03/19/2021  Discharge Diagnosis: STEMI  How have you been since you were released from the hospital? Tired   Medication changes made at discharge:  - START: aspirin, atorvastatin, Brilinta, Carvedilol, Entresto, Farxiga potassium chloride, spironolactone  - STOPPED: HCTZ, amlodipine   - CHANGED: N/A  Medication changes verified by the patient? Yes (Yes/No)    Medication Accessibility:  Home Pharmacy: NA   Was the patient provided with refills on discharged medications? No   Medication Review: TICAGRELOR (BRILINTA) Ticagrelor 90 mg BID initiated on .  - Educated patient on expected duration of therapy of aspirin with ticagrelor. Advised patient that dose of ticagrelor will be reduced after . Aspirin will be continued indefinitely/discontinued. - Discussed importance of taking medication around the same time every day, - Reviewed potential DDIs with patient - Advised patient of medications to avoid (NSAIDs, aspirin maintenance doses>100 mg daily) - Educated that Tylenol (acetaminophen) will be the preferred analgesic to prevent risk of bleeding  - Emphasized importance of monitoring for signs and symptoms of bleeding (abnormal bruising, prolonged bleeding, nose bleeds, bleeding from gums, discolored urine, black tarry stools)  - Educated patient to notify doctor if shortness of breath or abnormal heartbeat occur - Advised patient to alert all providers of antiplatelet therapy prior to starting a new medication or having a procedure   Follow-up Appointments:  PCP Hospital f/u appt confirmed? Yes   Specialist Hospital f/u appt confirmed? Yes   If their condition worsens, is the pt aware to call PCP or go to the Emergency Dept.? Yes  Final Patient Assessment: Patient reports difficulty with sleeping at night, plans to reach out to MD Reviewed medicaitons

## 2021-04-08 ENCOUNTER — Encounter (HOSPITAL_COMMUNITY): Payer: Self-pay | Admitting: Emergency Medicine

## 2021-04-08 ENCOUNTER — Emergency Department (HOSPITAL_COMMUNITY): Payer: Self-pay

## 2021-04-08 ENCOUNTER — Inpatient Hospital Stay (HOSPITAL_COMMUNITY)
Admission: EM | Admit: 2021-04-08 | Discharge: 2021-04-11 | DRG: 683 | Disposition: A | Discharge status: Home / Self Care | Payer: Self-pay | Attending: Internal Medicine | Admitting: Internal Medicine

## 2021-04-08 ENCOUNTER — Other Ambulatory Visit: Payer: Self-pay

## 2021-04-08 DIAGNOSIS — Z9049 Acquired absence of other specified parts of digestive tract: Secondary | ICD-10-CM

## 2021-04-08 DIAGNOSIS — R079 Chest pain, unspecified: Secondary | ICD-10-CM | POA: Diagnosis present

## 2021-04-08 DIAGNOSIS — Z20822 Contact with and (suspected) exposure to covid-19: Secondary | ICD-10-CM | POA: Diagnosis present

## 2021-04-08 DIAGNOSIS — N179 Acute kidney failure, unspecified: Principal | ICD-10-CM | POA: Diagnosis present

## 2021-04-08 DIAGNOSIS — E782 Mixed hyperlipidemia: Secondary | ICD-10-CM | POA: Diagnosis present

## 2021-04-08 DIAGNOSIS — E871 Hypo-osmolality and hyponatremia: Secondary | ICD-10-CM | POA: Diagnosis present

## 2021-04-08 DIAGNOSIS — I129 Hypertensive chronic kidney disease with stage 1 through stage 4 chronic kidney disease, or unspecified chronic kidney disease: Secondary | ICD-10-CM | POA: Diagnosis present

## 2021-04-08 DIAGNOSIS — F17210 Nicotine dependence, cigarettes, uncomplicated: Secondary | ICD-10-CM | POA: Diagnosis present

## 2021-04-08 DIAGNOSIS — I25119 Atherosclerotic heart disease of native coronary artery with unspecified angina pectoris: Secondary | ICD-10-CM | POA: Diagnosis present

## 2021-04-08 DIAGNOSIS — N1832 Chronic kidney disease, stage 3b: Secondary | ICD-10-CM | POA: Diagnosis present

## 2021-04-08 DIAGNOSIS — Z7982 Long term (current) use of aspirin: Secondary | ICD-10-CM

## 2021-04-08 DIAGNOSIS — R0609 Other forms of dyspnea: Secondary | ICD-10-CM | POA: Diagnosis present

## 2021-04-08 DIAGNOSIS — I252 Old myocardial infarction: Secondary | ICD-10-CM

## 2021-04-08 DIAGNOSIS — E860 Dehydration: Secondary | ICD-10-CM | POA: Diagnosis present

## 2021-04-08 DIAGNOSIS — Z8674 Personal history of sudden cardiac arrest: Secondary | ICD-10-CM

## 2021-04-08 DIAGNOSIS — Z955 Presence of coronary angioplasty implant and graft: Secondary | ICD-10-CM

## 2021-04-08 DIAGNOSIS — Z79899 Other long term (current) drug therapy: Secondary | ICD-10-CM

## 2021-04-08 DIAGNOSIS — Z7902 Long term (current) use of antithrombotics/antiplatelets: Secondary | ICD-10-CM

## 2021-04-08 HISTORY — DX: Ischemic cardiomyopathy: I25.5

## 2021-04-08 LAB — BASIC METABOLIC PANEL
Anion gap: 9 (ref 5–15)
BUN: 29 mg/dL — ABNORMAL HIGH (ref 8–23)
CO2: 17 mmol/L — ABNORMAL LOW (ref 22–32)
Calcium: 9 mg/dL (ref 8.9–10.3)
Chloride: 106 mmol/L (ref 98–111)
Creatinine, Ser: 2.39 mg/dL — ABNORMAL HIGH (ref 0.61–1.24)
GFR, Estimated: 30 mL/min — ABNORMAL LOW (ref >60)
Glucose, Bld: 188 mg/dL — ABNORMAL HIGH (ref 70–99)
Potassium: 4.5 mmol/L (ref 3.5–5.1)
Sodium: 132 mmol/L — ABNORMAL LOW (ref 135–145)

## 2021-04-08 LAB — CBC
HCT: 43.6 % (ref 39.0–52.0)
Hemoglobin: 14.8 g/dL (ref 13.0–17.0)
MCH: 32.7 pg (ref 26.0–34.0)
MCHC: 33.9 g/dL (ref 30.0–36.0)
MCV: 96.2 fL (ref 80.0–100.0)
Platelets: 323 10*3/uL (ref 150–400)
RBC: 4.53 MIL/uL (ref 4.22–5.81)
RDW: 12.8 % (ref 11.5–15.5)
WBC: 7.8 10*3/uL (ref 4.0–10.5)
nRBC: 0 % (ref 0.0–0.2)

## 2021-04-08 LAB — TROPONIN I (HIGH SENSITIVITY): Troponin I (High Sensitivity): 49 ng/L — ABNORMAL HIGH (ref <18)

## 2021-04-08 LAB — RESP PANEL BY RT-PCR (FLU A&B, COVID) ARPGX2
Influenza A by PCR: NEGATIVE
Influenza B by PCR: NEGATIVE
SARS Coronavirus 2 by RT PCR: NEGATIVE

## 2021-04-08 MED ORDER — ASPIRIN 81 MG PO CHEW
324.0000 mg | CHEWABLE_TABLET | Freq: Once | ORAL | Status: DC
  Filled 2021-04-08: qty 4

## 2021-04-08 MED ORDER — ASPIRIN 81 MG PO CHEW
243.0000 mg | CHEWABLE_TABLET | Freq: Once | ORAL | Status: AC
  Administered 2021-04-08: 243 mg via ORAL

## 2021-04-08 NOTE — ED Provider Notes (Signed)
[facility] EMERGENCY DEPARTMENT Provider Note   CSN: [id number] Arrival date & time: 04/08/21  2137     History Chief Complaint  Patient presents with   Chest Pain   Shortness of Breath    Logan Brewer is a 65 y.o. male.  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  It appears that he had simply is 9/21 He states that he feels that his chest pressure is central and radiates to his left arm he seems to not be completely certain of when his pain started states it is been ongoing and relatively unchanged since he left the hospital.  His wife states that he has been more sluggish and tired since leaving the hospital and she feels that he should have been feeling better by now.  Denies any orthopnea or PND.  Denies any cough fevers or chills.  No hemoptysis.  Denies any weight changes or leg swelling or calf pain.  Denies any nausea vomiting or diaphoresis.    Past Medical History:  Diagnosis Date   Hydrocele in adult    Hypertension    Inguinal hernia    Smoker 03/16/2021   smokes 1/2 ppd PTA per hx    Patient Active Problem List   Diagnosis Date Noted   Acute kidney injury (HCC) 04/09/2021   Cardiac arrest (HCC) 03/12/2021   Atrial fibrillation (HCC) 03/12/2021   STEMI (ST elevation myocardial infarction) (HCC) 03/12/2021   AKI (acute kidney injury) (HCC) 03/12/2021   Shock liver 03/12/2021   Endotracheally intubated 03/12/2021   Acute pulmonary edema (HCC) 03/12/2021   Anoxic encephalopathy (HCC) 03/12/2021   Acute ST elevation myocardial infarction  (STEMI) of anterior wall [facility])    Metabolic acidosis    Coronary artery disease involving native coronary artery of native heart with unstable angina pectoris (HCC)    Chronic left shoulder pain 07/03/2020   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   STD (male) 04/25/2020   Erectile dysfunction due to arterial insufficiency 02/26/2020   Other hydrocele 12/05/2019   Left inguinal hernia    HTN (hypertension) 04/26/2013   Leukocytosis, unspecified 04/26/2013   Radiculopathy, lumbosacral region 04/25/2013   Back pain 04/25/2013   Anxiety 04/25/2013    Past Surgical History:  Procedure Laterality Date   APPENDECTOMY     CORONARY STENT INTERVENTION N/A 03/11/2021   Procedure: CORONARY STENT INTERVENTION;  Surgeon: Marykay Lex, MD;  Location: [facility] INVASIVE CV LAB;  Service: Cardiovascular;  Laterality: N/A;   CORONARY/GRAFT ACUTE MI REVASCULARIZATION N/A 03/11/2021   Procedure: Coronary/Graft Acute MI Revascularization;  Surgeon: Marykay Lex, MD;  Location: [facility] INVASIVE CV LAB;  Service: Cardiovascular;  Laterality: N/A;   HYDROCELE EXCISION Bilateral 11/21/2019   Procedure: HYDROCELECTOMY ADULT;  Surgeon: Malen Gauze, MD;  Location: [facility] ORS;  Service: Urology;  Laterality: Bilateral;  [id number]   HYDROCELE EXCISION Right 04/07/2020   Procedure: RIGHT HYDROCELECTOMY;  Surgeon: Malen Gauze, MD;  Location: [facility] ORS;  Service: Urology;  Laterality: Right;   INGUINAL HERNIA REPAIR Left 11/21/2019   Procedure: HERNIA REPAIR INGUINAL ADULT;  Surgeon: [name],  MD;  Location: [facility] ORS;  Service: General;  Laterality: Left;  [id number]   RIGHT/LEFT HEART CATH AND CORONARY ANGIOGRAPHY N/A 03/11/2021   Procedure: RIGHT/LEFT HEART CATH AND CORONARY ANGIOGRAPHY;  Surgeon: Marykay Lex, MD;  Location: [facility] INVASIVE CV LAB;  Service: Cardiovascular;  Laterality: N/A;       No family history on file.  Social History   Tobacco Use   Smoking status: Every Day    Packs/day: 0.50    Types:  Cigarettes   Smokeless tobacco: Never  Vaping Use   Vaping Use: Never used  Substance Use Topics   Alcohol use: No   Drug use: Yes    Types: Marijuana    Comment: occasionally     Home Medications Prior to Admission medications   Medication Sig Start Date End Date Taking? Authorizing Provider  amLODipine (NORVASC) 10 MG tablet Take 1 tablet (10 mg total) by mouth daily. 11/04/20   Grayce Sessions, NP  aspirin EC 81 MG EC tablet Take 1 tablet (81 mg total) by mouth daily. Swallow whole. 03/19/21   Zannie Cove, MD  atorvastatin (LIPITOR) 80 MG tablet Take 1 tablet (80 mg total) by mouth daily. 03/19/21   Zannie Cove, MD  carvedilol (COREG) 12.5 MG tablet Take 1 tablet (12.5 mg total) by mouth 2 (two) times daily with a meal. 11/04/20   Grayce Sessions, NP  carvedilol (COREG) 6.25 MG tablet Take 1 tablet (6.25 mg total) by mouth 2 (two) times daily with a meal. 03/19/21   Zannie Cove, MD  celecoxib (CELEBREX) 100 MG capsule Take 1 capsule (100 mg total) by mouth 2 (two) times daily. 11/25/20   Grayce Sessions, NP  dapagliflozin propanediol (FARXIGA) 10 MG TABS tablet Take 1 tablet (10 mg total) by mouth daily. 03/20/21   Zannie Cove, MD  hydrochlorothiazide (HYDRODIURIL) 25 MG tablet Take 1 tablet (25 mg total) by mouth daily. 11/04/20   Grayce Sessions, NP  HYDROcodone-acetaminophen (NORCO/VICODIN) 5-325 MG tablet One tablet every four hours for pain. 07/24/20   Darreld Mclean, MD  HYDROcodone-acetaminophen (NORCO/VICODIN) 5-325 MG tablet Take 1 tablet by mouth every 4 (four) hours as needed for moderate pain. 01/13/21   Nadara Mustard, MD  PARoxetine (PAXIL) 10 MG tablet TAKE 1 TABLET BY MOUTH EVERY DAY AS NEEDED 07/28/20   McKenzie, Mardene Celeste, MD  potassium chloride SA (KLOR-CON) 20 MEQ tablet Take 2 tablets (40 mEq total) by mouth daily. 03/19/21   Zannie Cove, MD  pravastatin (PRAVACHOL) 20 MG tablet Take 1 tablet (20 mg total) by mouth daily. 11/06/20   Grayce Sessions, NP  predniSONE (DELTASONE) 10 MG tablet Take 1 tablet (10 mg total) by mouth daily with breakfast. 12/04/20   Nadara Mustard, MD  sacubitril-valsartan (ENTRESTO) 97-103 MG Take 1 tablet by mouth 2 (two) times daily. 03/19/21   Zannie Cove, MD  spironolactone (ALDACTONE) 25 MG tablet Take 1 tablet (25 mg total) by mouth daily. 03/20/21   Zannie Cove, MD  ticagrelor (BRILINTA) 90 MG TABS tablet Take 1 tablet (90 mg total) by mouth 2 (two) times daily. 03/19/21   Zannie Cove, MD    Allergies    Patient has no known allergies.  Review of Systems   Review of Systems  Constitutional:  Negative for chills and fever.  HENT:  Negative for congestion.   Eyes:  Negative for pain.  Respiratory:  Positive for shortness of breath. Negative for cough.   Cardiovascular:  Positive for chest  pain. Negative for leg swelling.  Gastrointestinal:  Negative for abdominal pain and vomiting.  Genitourinary:  Negative for dysuria.  Musculoskeletal:  Negative for myalgias.  Skin:  Negative for rash.  Neurological:  Negative for dizziness and headaches.   Physical Exam Updated Vital Signs BP 116/69   Pulse 71   Temp 97.7 F (36.5 C) (Oral)   Resp (!) 23   SpO2 97%   Physical Exam Vitals and nursing note reviewed.  Constitutional:      General: He is not in acute distress.    Comments: Withdrawn 65 year old male in no acute distress speaking full sentences  HENT:     Head: Normocephalic and atraumatic.     Nose: Nose normal.  Eyes:     General: No scleral icterus. Cardiovascular:     Rate and Rhythm: Normal rate and regular rhythm.     Pulses: Normal pulses.     Heart sounds: Normal heart sounds.  Pulmonary:     Effort: Pulmonary effort is normal. No respiratory distress.     Breath sounds: No wheezing.     Comments: Lungs CAB Abdominal:     Palpations: Abdomen is soft.     Tenderness: There is no abdominal tenderness.  Musculoskeletal:     Cervical back: Normal range of  motion.     Right lower leg: No edema.     Left lower leg: No edema.     Comments: No lower extremity edema.  No calf tenderness bilateral DP pulses 3+  Skin:    General: Skin is warm and dry.     Capillary Refill: Capillary refill takes less than 2 seconds.  Neurological:     Mental Status: He is alert. Mental status is at baseline.  Psychiatric:        Mood and Affect: Mood normal.        Behavior: Behavior normal.    ED Results / Procedures / Treatments   Labs (all labs ordered are listed, but only abnormal results are displayed) Labs Reviewed  BASIC METABOLIC PANEL - Abnormal; Notable for the following components:      Result Value   Sodium 132 (*)    CO2 17 (*)    Glucose, Bld 188 (*)    BUN 29 (*)    Creatinine, Ser 2.39 (*)    GFR, Estimated 30 (*)    All other components within normal limits  TROPONIN I (HIGH SENSITIVITY) - Abnormal; Notable for the following components:   Troponin I (High Sensitivity) 49 (*)    All other components within normal limits  RESP PANEL BY RT-PCR (FLU A&B, COVID) ARPGX2  CBC  BRAIN NATRIURETIC PEPTIDE  RAPID URINE DRUG SCREEN, HOSP PERFORMED  TROPONIN I (HIGH SENSITIVITY)    EKG EKG Interpretation  Date/Time:  Wednesday April 08 2021 21:44:45 EDT Ventricular Rate:  93 PR Interval:  142 QRS Duration: 96 QT Interval:  354 QTC Calculation: 440 R Axis:   94 Text Interpretation: Normal sinus rhythm Rightward axis Septal infarct , age undetermined ST & T wave abnormality, consider inferior ischemia ST & T wave abnormality, consider anterolateral ischemia Abnormal ECG Confirmed by Benjiman Core [phone]) on 04/08/2021 9:49:38 PM  Radiology DG Chest Port 1 View  Result Date: 04/08/2021 CLINICAL DATA:  Left-sided chest pain that radiates to the left arm, shortness of breath on exertion. EXAM: PORTABLE CHEST 1 VIEW COMPARISON:  March 16, 2021. FINDINGS: The heart size and mediastinal contours are within normal limits. No focal  consolidation. No  visible pleural effusion or pneumothorax. The visualized skeletal structures are unremarkable. IMPRESSION: No acute cardiopulmonary disease. Electronically Signed   By: Maudry Mayhew M.D.   On: 04/08/2021 22:18    Procedures Procedures   Medications Ordered in ED Medications  sodium chloride 0.9 % bolus 500 mL (has no administration in time range)  aspirin chewable tablet 243 mg (243 mg Oral Given 04/08/21 2257)    ED Course  I have reviewed the triage vital signs and the nursing notes.  Pertinent labs & imaging results that were available during my care of the patient were reviewed by me and considered in my medical decision making (see chart for details).    MDM Rules/Calculators/A&P                          Patient is a 65 year old gentleman recent STEMI with PCI  Here for continued weakness fatigue and some shortness of breath  Physical exam patient appears somewhat fatigued but in no acute distress  That he has had some decreased appetite and worsening fatigue  Appears dehydrated on physical exam  COVID influenza negative.  CBC without leukocytosis or anemia.  BMP notable for mild hyponatremia, BUN and creatinine had elevated consistent with hemoconcentration and hemoglobin increased some from prior CBC--noted by Dr. Leafy Half at admission.   Troponin x1 is 49 significant improvement from significant elevations during STEMI. EKG w no acute ischemic changes. Abnormal EKG.   Family updated to plan.  We will gently hydrate.  Final Clinical Impression(s) / ED Diagnoses Final diagnoses:  AKI (acute kidney injury) [facility])  Dehydration    Rx / DC Orders ED Discharge Orders     None        Gailen Shelter, [place] 04/09/21 0034    Rozelle Logan, DO 04/10/21 1539

## 2021-04-08 NOTE — ED Triage Notes (Signed)
Pt c/o left sided chest pain that radiates to his left arm, along with shortness of breath on exertion. Pt had cardiac arrest and stent placed in September 2022, wife reports no new swelling or weight gain.

## 2021-04-09 ENCOUNTER — Encounter (HOSPITAL_COMMUNITY): Payer: Self-pay | Admitting: Internal Medicine

## 2021-04-09 DIAGNOSIS — E782 Mixed hyperlipidemia: Secondary | ICD-10-CM

## 2021-04-09 DIAGNOSIS — N179 Acute kidney failure, unspecified: Secondary | ICD-10-CM | POA: Diagnosis present

## 2021-04-09 DIAGNOSIS — E860 Dehydration: Secondary | ICD-10-CM

## 2021-04-09 DIAGNOSIS — R0609 Other forms of dyspnea: Secondary | ICD-10-CM | POA: Diagnosis present

## 2021-04-09 DIAGNOSIS — I25119 Atherosclerotic heart disease of native coronary artery with unspecified angina pectoris: Secondary | ICD-10-CM

## 2021-04-09 DIAGNOSIS — R072 Precordial pain: Secondary | ICD-10-CM

## 2021-04-09 DIAGNOSIS — R079 Chest pain, unspecified: Secondary | ICD-10-CM | POA: Diagnosis present

## 2021-04-09 DIAGNOSIS — E871 Hypo-osmolality and hyponatremia: Secondary | ICD-10-CM | POA: Diagnosis present

## 2021-04-09 DIAGNOSIS — F17210 Nicotine dependence, cigarettes, uncomplicated: Secondary | ICD-10-CM | POA: Diagnosis present

## 2021-04-09 LAB — CBC WITH DIFFERENTIAL/PLATELET
Abs Immature Granulocytes: 0.02 10*3/uL (ref 0.00–0.07)
Basophils Absolute: 0 10*3/uL (ref 0.0–0.1)
Basophils Relative: 1 %
Eosinophils Absolute: 0.3 10*3/uL (ref 0.0–0.5)
Eosinophils Relative: 4 %
HCT: 40.6 % (ref 39.0–52.0)
Hemoglobin: 13.5 g/dL (ref 13.0–17.0)
Immature Granulocytes: 0 %
Lymphocytes Relative: 37 %
Lymphs Abs: 2.7 10*3/uL (ref 0.7–4.0)
MCH: 32.1 pg (ref 26.0–34.0)
MCHC: 33.3 g/dL (ref 30.0–36.0)
MCV: 96.7 fL (ref 80.0–100.0)
Monocytes Absolute: 1 10*3/uL (ref 0.1–1.0)
Monocytes Relative: 13 %
Neutro Abs: 3.3 10*3/uL (ref 1.7–7.7)
Neutrophils Relative %: 45 %
Platelets: 273 10*3/uL (ref 150–400)
RBC: 4.2 MIL/uL — ABNORMAL LOW (ref 4.22–5.81)
RDW: 12.8 % (ref 11.5–15.5)
WBC: 7.4 10*3/uL (ref 4.0–10.5)
nRBC: 0 % (ref 0.0–0.2)

## 2021-04-09 LAB — RAPID URINE DRUG SCREEN, HOSP PERFORMED
Amphetamines: NOT DETECTED
Barbiturates: NOT DETECTED
Benzodiazepines: NOT DETECTED
Cocaine: NOT DETECTED
Opiates: NOT DETECTED
Tetrahydrocannabinol: NOT DETECTED

## 2021-04-09 LAB — COMPREHENSIVE METABOLIC PANEL
ALT: 19 U/L (ref 0–44)
AST: 16 U/L (ref 15–41)
Albumin: 3 g/dL — ABNORMAL LOW (ref 3.5–5.0)
Alkaline Phosphatase: 71 U/L (ref 38–126)
Anion gap: 7 (ref 5–15)
BUN: 29 mg/dL — ABNORMAL HIGH (ref 8–23)
CO2: 19 mmol/L — ABNORMAL LOW (ref 22–32)
Calcium: 8.8 mg/dL — ABNORMAL LOW (ref 8.9–10.3)
Chloride: 109 mmol/L (ref 98–111)
Creatinine, Ser: 2.36 mg/dL — ABNORMAL HIGH (ref 0.61–1.24)
GFR, Estimated: 30 mL/min — ABNORMAL LOW (ref >60)
Glucose, Bld: 98 mg/dL (ref 70–99)
Potassium: 4.6 mmol/L (ref 3.5–5.1)
Sodium: 135 mmol/L (ref 135–145)
Total Bilirubin: 0.4 mg/dL (ref 0.3–1.2)
Total Protein: 6.9 g/dL (ref 6.5–8.1)

## 2021-04-09 LAB — BRAIN NATRIURETIC PEPTIDE: B Natriuretic Peptide: 65.7 pg/mL (ref 0.0–100.0)

## 2021-04-09 LAB — D-DIMER, QUANTITATIVE: D-Dimer, Quant: 0.49 ug/mL-FEU (ref 0.00–0.50)

## 2021-04-09 LAB — TROPONIN I (HIGH SENSITIVITY)
Troponin I (High Sensitivity): 44 ng/L — ABNORMAL HIGH (ref <18)
Troponin I (High Sensitivity): 48 ng/L — ABNORMAL HIGH (ref <18)

## 2021-04-09 LAB — MAGNESIUM: Magnesium: 1.9 mg/dL (ref 1.7–2.4)

## 2021-04-09 MED ORDER — ACETAMINOPHEN 325 MG PO TABS: 650.0000 mg | ORAL_TABLET | Freq: Four times a day (QID) | ORAL | Status: DC | PRN

## 2021-04-09 MED ORDER — NITROGLYCERIN 0.4 MG SL SUBL: 0.4000 mg | SUBLINGUAL_TABLET | SUBLINGUAL | Status: DC | PRN

## 2021-04-09 MED ORDER — CARVEDILOL 6.25 MG PO TABS
6.2500 mg | ORAL_TABLET | Freq: Two times a day (BID) | ORAL | Status: DC
  Administered 2021-04-09 – 2021-04-11 (×5): 6.25 mg via ORAL
  Filled 2021-04-09 (×2): qty 2
  Filled 2021-04-09 (×3): qty 1

## 2021-04-09 MED ORDER — POLYETHYLENE GLYCOL 3350 17 G PO PACK: 17.0000 g | PACK | Freq: Every day | ORAL | Status: DC | PRN

## 2021-04-09 MED ORDER — LACTATED RINGERS IV BOLUS
250.0000 mL | Freq: Once | INTRAVENOUS | Status: AC
  Administered 2021-04-09: 250 mL via INTRAVENOUS

## 2021-04-09 MED ORDER — HYDRALAZINE HCL 10 MG PO TABS: 10.0000 mg | ORAL_TABLET | Freq: Four times a day (QID) | ORAL | Status: DC | PRN

## 2021-04-09 MED ORDER — ASPIRIN EC 81 MG PO TBEC
81.0000 mg | DELAYED_RELEASE_TABLET | Freq: Every day | ORAL | Status: DC
  Administered 2021-04-09 – 2021-04-11 (×3): 81 mg via ORAL
  Filled 2021-04-09 (×3): qty 1

## 2021-04-09 MED ORDER — SODIUM CHLORIDE 0.9 % IV BOLUS
500.0000 mL | Freq: Once | INTRAVENOUS | Status: AC
  Administered 2021-04-09: 500 mL via INTRAVENOUS

## 2021-04-09 MED ORDER — SACUBITRIL-VALSARTAN 97-103 MG PO TABS
1.0000 | ORAL_TABLET | Freq: Two times a day (BID) | ORAL | Status: DC
  Administered 2021-04-09: 1 via ORAL
  Filled 2021-04-09 (×2): qty 1

## 2021-04-09 MED ORDER — LACTATED RINGERS IV SOLN: INTRAVENOUS | Status: AC

## 2021-04-09 MED ORDER — ACETAMINOPHEN 650 MG RE SUPP: 650.0000 mg | Freq: Four times a day (QID) | RECTAL | Status: DC | PRN

## 2021-04-09 MED ORDER — ONDANSETRON HCL 4 MG PO TABS: 4.0000 mg | ORAL_TABLET | Freq: Four times a day (QID) | ORAL | Status: DC | PRN

## 2021-04-09 MED ORDER — TICAGRELOR 90 MG PO TABS
90.0000 mg | ORAL_TABLET | Freq: Two times a day (BID) | ORAL | Status: DC
  Administered 2021-04-09 – 2021-04-11 (×5): 90 mg via ORAL
  Filled 2021-04-09 (×5): qty 1

## 2021-04-09 MED ORDER — MELATONIN 5 MG PO TABS: 10.0000 mg | ORAL_TABLET | Freq: Every evening | ORAL | Status: DC | PRN

## 2021-04-09 MED ORDER — ATORVASTATIN CALCIUM 80 MG PO TABS
80.0000 mg | ORAL_TABLET | Freq: Every day | ORAL | Status: DC
  Administered 2021-04-09 – 2021-04-11 (×3): 80 mg via ORAL
  Filled 2021-04-09: qty 2
  Filled 2021-04-09 (×2): qty 1

## 2021-04-09 MED ORDER — ONDANSETRON HCL 4 MG/2ML IJ SOLN: 4.0000 mg | Freq: Four times a day (QID) | INTRAMUSCULAR | Status: DC | PRN

## 2021-04-09 MED ORDER — PANTOPRAZOLE SODIUM 40 MG PO TBEC
40.0000 mg | DELAYED_RELEASE_TABLET | Freq: Every day | ORAL | Status: DC
  Administered 2021-04-09 – 2021-04-11 (×3): 40 mg via ORAL
  Filled 2021-04-09 (×3): qty 1

## 2021-04-09 MED ORDER — ENOXAPARIN SODIUM 40 MG/0.4ML IJ SOSY
40.0000 mg | PREFILLED_SYRINGE | INTRAMUSCULAR | Status: DC
  Administered 2021-04-09 – 2021-04-11 (×3): 40 mg via SUBCUTANEOUS
  Filled 2021-04-09 (×3): qty 0.4

## 2021-04-09 MED ORDER — SODIUM CHLORIDE 0.9 % IV BOLUS: 500.0000 mL | Freq: Once | INTRAVENOUS | Status: DC

## 2021-04-09 MED ORDER — IPRATROPIUM-ALBUTEROL 0.5-2.5 (3) MG/3ML IN SOLN: 3.0000 mL | RESPIRATORY_TRACT | Status: DC | PRN

## 2021-04-09 NOTE — Assessment & Plan Note (Signed)
·   Please see assessment and plan above °

## 2021-04-09 NOTE — Progress Notes (Signed)
PROGRESS NOTE    Logan Brewer  QAS:[id number] DOB: [birth year] DOA: 04/08/2021 PCP: Grayce Sessions, NP   Brief Narrative:  HPI:   65 year old male with past medical history of prediabetes, hyperlipidemia, nicotine dependence, marijuana use and recent hospitalization on 9/21 after patient experienced an episode of V. fib arrest in the field with EMS achieving ROSC followed by patient developing recurrent V. fib arrest upon arrival to the ER.  After achieving ROSC patient was found to be suffering from an anterior STEMI with cardiac catheterization revealing a 99% occlusion of the LAD status post DES.  Hospital course in the following days was complicated by shock liver, acute kidney injury and respiratory failure.  Patient slowly recovered and eventually was discharged on 9/29.   Patient explains that since his discharge he has developed progressively worsening shortness of breath.  Patient describes the shortness of breath as moderate in intensity, with occurring with exertion and improved with rest.  Patient denies any associated peripheral edema, paroxysmal nocturnal dyspnea or pillow orthopnea.  Patient reports being compliant with all of his medications.  Patient states that he has quit smoking cigarettes and marijuana since his recent hospitalization.   Patient additionally complains of episodes of chest discomfort, sharp to burning in quality, midsternal in location, moderate to severe in intensity, occurring with exertion and radiating down the left arm.  Patient states that these episodes do not necessarily occur simultaneously with the episodes of shortness of breath.  Patient eventually presented to [facility] emergency department for evaluation due to these complaints.   Upon evaluation in the emergency department initial troponins were found to be 49 and 48.  EKG revealed no dynamic ST segment change.  Chest x-ray was found to be unremarkable.  Creatinine was found to be  somewhat elevated at 2.39, compared to 1.73 at time of discharge on 9/29 concerning for mild acute kidney injury.  Patient was also felt to be volume depleted.  Patient was initiated on intravenous fluids.  The hospitalist group was then called to assess the patient for admission to the hospital.  Assessment & Plan:   Principal Problem:   Acute kidney injury [facility]) Active Problems:   Coronary artery disease involving native coronary artery of native heart with angina pectoris (HCC)   Chest pain   Dehydration with hyponatremia   Mixed hyperlipidemia   Nicotine dependence, cigarettes, uncomplicated   Dyspnea on exertion  History of CAD s/p DES in LAD during recent hospitalization/dyspnea on exertion/chest pain: Chest x-ray unremarkable.  BNP unremarkable.  Lungs clear to auscultation.  D-dimer unremarkable.  Patient complains of intermittent chest pain on the left side and calls and crampy chest pain.  Shortness of breath even with talking, he claims but he appears clinically stable and does not seem to have any dyspnea.  Troponin flat, much improved, not indicative of ACS.  Echo recently done during prior hospitalization on 03/16/2021 shows 40 to 45% ejection fraction and moderate hypokinesis of the left ventricle.  Cardiology has been consulted for further management.  He is on aspirin, Coreg and Brilinta.  Also on Entresto.  AKI on CKD stage IIIb: Baseline creatinine seems to be around 1.7-1.8.  Presented with 2.39 which is still 2.36 today.  Continue IV fluids.  Repeat labs in the morning.  Mixed hyperlipidemia: Continue home statin.  Nicotine dependence/cigarettes: Counseling for quitting provided.  DVT prophylaxis: enoxaparin (LOVENOX) injection 40 mg Start: 04/09/21 1000   Code Status: Full Code  Family Communication:  None present  at bedside.  Plan of care discussed with patient in length and he verbalized understanding and agreed with it.  Status is: Observation  The patient will  require care spanning > 2 midnights and should be moved to inpatient because: Needs further work-up.   Estimated body mass index is 26.32 kg/m as calculated from the following:   Height as of 03/15/21: 5\' 9"  (1.753 m).   Weight as of 03/15/21: 80.8 kg.     Nutritional Assessment: There is no height or weight on file to calculate BMI.. Seen by dietician.  I agree with the assessment and plan as outlined below: Nutrition Status:   Skin Assessment: I have examined the patient's skin and I agree with the wound assessment as performed by the wound care RN as outlined below:    Consultants:  Cardiology  Procedures:  None  Antimicrobials:  Anti-infectives (From admission, onward)    None          Subjective: Patient seen and examined.  He just woke up from sleep.  He says that he feels shortness of breath even with talking but he appears comfortable.  Also complains of intermittent chest pain.  No other complaint.  Objective: Vitals:   04/09/21 0900 04/09/21 0930 04/09/21 1000 04/09/21 1030  BP: (!) 104/55 (!) 115/53 101/77 130/66  Pulse: 65 64 78 (!) 58  Resp: 18 14 (!) 22 17  Temp:      TempSrc:      SpO2: 95% 96% 96% 97%    Intake/Output Summary (Last 24 hours) at 04/09/2021 1053 Last data filed at 04/09/2021 0644 Gross per 24 hour  Intake 750 ml  Output --  Net 750 ml   There were no vitals filed for this visit.  Examination:  General exam: Appears calm and comfortable  Respiratory system: Clear to auscultation. Respiratory effort normal. Cardiovascular system: S1 & S2 heard, RRR. No JVD, murmurs, rubs, gallops or clicks. No pedal edema. Gastrointestinal system: Abdomen is nondistended, soft and nontender. No organomegaly or masses felt. Normal bowel sounds heard. Central nervous system: Alert and oriented. No focal neurological deficits. Extremities: Symmetric 5 x 5 power. Skin: No rashes, lesions or ulcers Psychiatry: Judgement and insight appear  normal. Mood & affect appropriate.    Data Reviewed: I have personally reviewed following labs and imaging studies  CBC: Recent Labs  Lab 04/08/21 2151 04/09/21 0457  WBC 7.8 7.4  NEUTROABS  --  3.3  HGB 14.8 13.5  HCT 43.6 40.6  MCV 96.2 96.7  PLT 323 273   Basic Metabolic Panel: Recent Labs  Lab 04/08/21 2151 04/09/21 0457  NA 132* 135  K 4.5 4.6  CL 106 109  CO2 17* 19*  GLUCOSE 188* 98  BUN 29* 29*  CREATININE 2.39* 2.36*  CALCIUM 9.0 8.8*  MG  --  1.9   GFR: CrCl cannot be calculated (Unknown ideal weight.). Liver Function Tests: Recent Labs  Lab 04/09/21 0457  AST 16  ALT 19  ALKPHOS 71  BILITOT 0.4  PROT 6.9  ALBUMIN 3.0*   No results for input(s): LIPASE, AMYLASE in the last 168 hours. No results for input(s): AMMONIA in the last 168 hours. Coagulation Profile: No results for input(s): INR, PROTIME in the last 168 hours. Cardiac Enzymes: No results for input(s): CKTOTAL, CKMB, CKMBINDEX, TROPONINI in the last 168 hours. BNP (last 3 results) No results for input(s): PROBNP in the last 8760 hours. HbA1C: No results for input(s): HGBA1C in the last 72 hours. CBG:  No results for input(s): GLUCAP in the last 168 hours. Lipid Profile: No results for input(s): CHOL, HDL, LDLCALC, TRIG, CHOLHDL, LDLDIRECT in the last 72 hours. Thyroid Function Tests: No results for input(s): TSH, T4TOTAL, FREET4, T3FREE, THYROIDAB in the last 72 hours. Anemia Panel: No results for input(s): VITAMINB12, FOLATE, FERRITIN, TIBC, IRON, RETICCTPCT in the last 72 hours. Sepsis Labs: No results for input(s): PROCALCITON, LATICACIDVEN in the last 168 hours.  Recent Results (from the past 240 hour(s))  Resp Panel by RT-PCR (Flu A&B, Covid) Nasopharyngeal Swab     Status: None   Collection Time: 04/08/21 10:08 PM   Specimen: Nasopharyngeal Swab; Nasopharyngeal(NP) swabs in vial transport medium  Result Value Ref Range Status   SARS Coronavirus 2 by RT PCR NEGATIVE NEGATIVE  Final    Comment: (NOTE) SARS-CoV-2 target nucleic acids are NOT DETECTED.  The SARS-CoV-2 RNA is generally detectable in upper respiratory specimens during the acute phase of infection. The lowest concentration of SARS-CoV-2 viral copies this assay can detect is 138 copies/mL. A negative result does not preclude SARS-Cov-2 infection and should not be used as the sole basis for treatment or other patient management decisions. A negative result may occur with  improper specimen collection/handling, submission of specimen other than nasopharyngeal swab, presence of viral mutation(s) within the areas targeted by this assay, and inadequate number of viral copies(<138 copies/mL). A negative result must be combined with clinical observations, patient history, and epidemiological information. The expected result is Negative.  Fact Sheet for Patients:  BloggerCourse.com  Fact Sheet for Healthcare Providers:  SeriousBroker.it  This test is no t yet approved or cleared by the [place] FDA and  has been authorized for detection and/or diagnosis of SARS-CoV-2 by FDA under an Emergency Use Authorization (EUA). This EUA will remain  in effect (meaning this test can be used) for the duration of the COVID-19 declaration under Section 564(b)(1) of the Act, 21 U.S.C.section 360bbb-3(b)(1), unless the authorization is terminated  or revoked sooner.       Influenza A by PCR NEGATIVE NEGATIVE Final   Influenza B by PCR NEGATIVE NEGATIVE Final    Comment: (NOTE) The Xpert Xpress SARS-CoV-2/FLU/RSV plus assay is intended as an aid in the diagnosis of influenza from Nasopharyngeal swab specimens and should not be used as a sole basis for treatment. Nasal washings and aspirates are unacceptable for Xpert Xpress SARS-CoV-2/FLU/RSV testing.  Fact Sheet for Patients: BloggerCourse.com  Fact Sheet for Healthcare  Providers: SeriousBroker.it  This test is not yet approved or cleared by the [place] FDA and has been authorized for detection and/or diagnosis of SARS-CoV-2 by FDA under an Emergency Use Authorization (EUA). This EUA will remain in effect (meaning this test can be used) for the duration of the COVID-19 declaration under Section 564(b)(1) of the Act, 21 U.S.C. section 360bbb-3(b)(1), unless the authorization is terminated or revoked.  Performed at [facility], [address], [place], [place] [address]       Radiology Studies: DG Chest Port 1 View  Result Date: 04/08/2021 CLINICAL DATA:  Left-sided chest pain that radiates to the left arm, shortness of breath on exertion. EXAM: PORTABLE CHEST 1 VIEW COMPARISON:  March 16, 2021. FINDINGS: The heart size and mediastinal contours are within normal limits. No focal consolidation. No visible pleural effusion or pneumothorax. The visualized skeletal structures are unremarkable. IMPRESSION: No acute cardiopulmonary disease. Electronically Signed   By: Maudry Mayhew M.D.   On: 04/08/2021 22:18    Scheduled  Meds:  aspirin EC  81 mg Oral Daily   atorvastatin  80 mg Oral Daily   carvedilol  6.25 mg Oral BID WC   enoxaparin (LOVENOX) injection  40 mg Subcutaneous Q24H   pantoprazole  40 mg Oral Daily   sacubitril-valsartan  1 tablet Oral BID   ticagrelor  90 mg Oral BID   Continuous Infusions:  lactated ringers 100 mL/hr at 04/09/21 0510     LOS: 0 days   Time spent: 35 minutes   Hughie Closs, MD [facility]  04/09/2021, 10:53 AM  Please page via Loretha Stapler and do not message via secure chat for anything urgent. Secure chat can be used for anything non urgent.  How to contact the [facility] Attending or Consulting provider 7A - 7P or covering provider during after hours 7P -7A, for this patient?  Check the care team in [facility] and look for a) attending/consulting [facility] provider listed and b) the [facility]  team listed. Page or secure chat 7A-7P. Log into [email or web address] and use [place]'s universal password to access. If you do not have the password, please contact the hospital operator. Locate the [facility] provider you are looking for under [facility] and page to a number that you can be directly reached. If you still have difficulty reaching the provider, please page the [facility] (Director on Call) for the Hospitalists listed on amion for assistance.

## 2021-04-09 NOTE — Assessment & Plan Note (Signed)
   Chest x-ray clear  BNP unremarkable  No evidence of clinical volume overload on exam  No clinical evidence of pneumonia  D-dimer pending and will obtain VQ scan if this is elevated  No evidence of wheezing on exam but will attempt bronchodilator therapy

## 2021-04-09 NOTE — Assessment & Plan Note (Signed)
   Patient presenting with what appears to be mild acute kidney injury with creatinine up to 2.39, up from 1.73 on 9/29.  Clinically, patient appears to be slightly volume depleted with poor skin turgor and dry mucous membranes  Labs reveal a mild hyponatremia further suggestive of a hypovolemic hyponatremia.  Patient has recently initiated spironolactone which will be temporarily held at this time  Hydrating patient gently with intravenous lactated Ringer solution  Strict input output monitoring  Monitoring renal function and electrolytes with serial, chemistries  Avoiding nephrotoxic agents if at all possible

## 2021-04-09 NOTE — ED Notes (Signed)
Provider at bedside

## 2021-04-09 NOTE — Assessment & Plan Note (Signed)
   Patient complaining of chest discomfort with typical and atypical features.  While chest discomfort is exertional, it seems to be intermittent.  Patient also describes it as "burning" and sometimes "sharp".  Cycling cardiac enzymes, troponins are only minimally elevated.  Monitoring patient on telemetry  Considering patient's complaints of a burning sensation will initiate Protonix in case patient is experiencing some degree of dyspepsia or gastritis with the dual antiplatelet therapy he has recently been placed on.  We will additionally obtain D-dimer and obtain VQ scan if markedly elevated  We will temporarily make patient n.p.o. and review case with cardiology in the morning to determine if whether or not a noninvasive ischemic assessment is warranted.

## 2021-04-09 NOTE — Assessment & Plan Note (Signed)
.   Patient reports quitting since his last hospitalization . Patient has been congratulated on this and will continue to encourage cessation

## 2021-04-09 NOTE — Consult Note (Signed)
Cardiology Consultation:   Patient ID: Logan Brewer MRN: [id number]; DOB: 1955/06/26  Admit date: 04/08/2021 Date of Consult: 04/09/2021  PCP:  Grayce Sessions, NP   [facility] Providers Cardiologist:  Bryan Lemma, MD  Advanced Heart Failure:  Arvilla Meres, MD  {   Patient Profile:   Logan Brewer is a 65 y.o. male with a hx of CAD, VF/VT arrest, hypertension, hyperlipidemia and prior tobacco/marijuana smoker who is being seen 04/09/2021 for the evaluation of DOE & chest pain  at the request of Dr. Leafy Half.  Admitted 03/12/21 with VT/VF arrest secondary to acute anterior ST elevation MI with subsequent respiratory failure and cardiogenic shock. Emergent LHC with severe single vessel CAD, 95-99% ostial LAD s/p PCI + DES. EF 20-25%. RV normal. No MR. Patient was seen by advanced heart failure team and required inotropes.  Repeat limited echocardiogram showed improved LV function to 40 to 45%.  Patient was discharged on carvedilol, Entresto, spironolactone and dapagliflozin.  Dual antiplatelet therapy with aspirin and Brilinta.   History of Present Illness:   Mr. Scoggin reports since discharge he has dyspnea on exertion.  This reduced with rest.  Occasional substernal chest tightness radiating across his chest with "overexertion".  This relieved with stretching.  He reports he had dyspnea on exertion prior to his MI but now he has quit smoking tobacco and marijuana.  His dyspnea on exertion is much worse despite quitting smoking.  He denies orthopnea, PND, syncope, lower extremity edema or melena.  He is compliant with his medication.  Due to ongoing symptoms he came to ER for further evaluation.  His creatinine found elevated at 2.39 from 1.7 time at time of discharge.  He felt volume depleted and started on fluids.  Spironolactone held.  Hs-troponin Flat trend.    Chest x-ray without acute cardiopulmonary disease.   Past Medical History:  Diagnosis Date   Hydrocele in  adult    Hypertension    Inguinal hernia    Ischemic cardiomyopathy    Smoker 03/16/2021   smokes 1/2 ppd PTA per hx   STEMI involving left anterior descending coronary artery (HCC) 03/12/2021    Past Surgical History:  Procedure Laterality Date   APPENDECTOMY     CORONARY STENT INTERVENTION N/A 03/11/2021   Procedure: CORONARY STENT INTERVENTION;  Surgeon: Marykay Lex, MD;  Location: [facility] INVASIVE CV LAB;  Service: Cardiovascular;  Laterality: N/A;   CORONARY/GRAFT ACUTE MI REVASCULARIZATION N/A 03/11/2021   Procedure: Coronary/Graft Acute MI Revascularization;  Surgeon: Marykay Lex, MD;  Location: [facility] INVASIVE CV LAB;  Service: Cardiovascular;  Laterality: N/A;   HYDROCELE EXCISION Bilateral 11/21/2019   Procedure: HYDROCELECTOMY ADULT;  Surgeon: Malen Gauze, MD;  Location: [facility] ORS;  Service: Urology;  Laterality: Bilateral;  [id number]   HYDROCELE EXCISION Right 04/07/2020   Procedure: RIGHT HYDROCELECTOMY;  Surgeon: Malen Gauze, MD;  Location: [facility] ORS;  Service: Urology;  Laterality: Right;   INGUINAL HERNIA REPAIR Left 11/21/2019   Procedure: HERNIA REPAIR INGUINAL ADULT;  Surgeon: [name], MD;  Location: [facility];  Service: General;  Laterality: Left;  [id number]   RIGHT/LEFT HEART CATH AND CORONARY ANGIOGRAPHY N/A 03/11/2021   Procedure: RIGHT/LEFT HEART CATH AND CORONARY ANGIOGRAPHY;  Surgeon: Marykay Lex, MD;  Location: [facility] INVASIVE CV LAB;  Service: Cardiovascular;  Laterality: N/A;     Inpatient Medications: Scheduled Meds:  aspirin EC  81 mg Oral Daily   atorvastatin  80 mg Oral Daily   carvedilol  6.25 mg Oral  BID WC   enoxaparin (LOVENOX) injection  40 mg Subcutaneous Q24H   pantoprazole  40 mg Oral Daily   sacubitril-valsartan  1 tablet Oral BID   ticagrelor  90 mg Oral BID   Continuous Infusions:  lactated ringers 100 mL/hr at 04/09/21 0510   PRN Meds: acetaminophen **OR** acetaminophen, ipratropium-albuterol, melatonin, nitroGLYCERIN,  ondansetron **OR** ondansetron (ZOFRAN) IV, polyethylene glycol  Allergies:   No Known Allergies  Social History:   Social History   Socioeconomic History   Marital status: Single    Spouse name: Not on file   Number of children: Not on file   Years of education: Not on file   Highest education level: Not on file  Occupational History   Not on file  Tobacco Use   Smoking status: Every Day    Packs/day: 0.50    Types: Cigarettes   Smokeless tobacco: Never  Vaping Use   Vaping Use: Never used  Substance and Sexual Activity   Alcohol use: No   Drug use: Yes    Types: Marijuana    Comment: occasionally    Sexual activity: Not on file  Other Topics Concern   Not on file  Social History Narrative   ** Merged History Encounter **       Social Determinants of Health   Financial Resource Strain: Medium Risk   Difficulty of Paying Living Expenses: Somewhat hard  Food Insecurity: Food Insecurity Present   Worried About Programme researcher, broadcasting/film/video in the Last Year: Never true   Barista in the Last Year: Sometimes true  Transportation Needs: No Transportation Needs   Lack of Transportation (Medical): No   Lack of Transportation (Non-Medical): No  Physical Activity: Not on file  Stress: Not on file  Social Connections: Not on file  Intimate Partner Violence: Not on file    Family History:   Family History  Problem Relation Age of Onset   Heart disease Neg Hx      ROS:  Please see the history of present illness.  All other ROS reviewed and negative.     Physical Exam/Data:   Vitals:   04/09/21 0900 04/09/21 0930 04/09/21 1000 04/09/21 1030  BP: (!) 104/55 (!) 115/53 101/77 130/66  Pulse: 65 64 78 (!) 58  Resp: 18 14 (!) 22 17  Temp:      TempSrc:      SpO2: 95% 96% 96% 97%    Intake/Output Summary (Last 24 hours) at 04/09/2021 1130 Last data filed at 04/09/2021 0644 Gross per 24 hour  Intake 750 ml  Output --  Net 750 ml   Last 3 Weights 03/15/2021  03/15/2021 03/14/2021  Weight (lbs) 178 lb 3.2 oz 178 lb 12.7 oz 197 lb 5 oz  Weight (kg) 80.831 kg 81.1 kg 89.5 kg     There is no height or weight on file to calculate BMI.  General:  Well nourished, well developed, in no acute distress HEENT: normal Neck: no JVD Vascular: No carotid bruits; Distal pulses 2+ bilaterally Cardiac:  normal S1, S2; RRR; no murmur  Lungs:  clear to auscultation bilaterally, no wheezing, rhonchi or rales  Abd: soft, nontender, no hepatomegaly  Ext: no edema Musculoskeletal:  No deformities, BUE and BLE strength normal and equal Skin: warm and dry  Neuro:  CNs 2-12 intact, no focal abnormalities noted Psych:  Normal affect   EKG:  The EKG was personally reviewed and demonstrates: Sinus rhythm, inferior anterior lateral T wave inversion  Telemetry:  Telemetry was personally reviewed and demonstrates:  SR with PVCs  Relevant CV Studies:  Limited echo 03/16/21 1. Limited echo for LVEF and regional wall motion.   2. Left ventricular ejection fraction, by estimation, is 40 to 45%. Left  ventricular ejection fraction by 2D MOD biplane is 39.8 %. The left  ventricle has mildly decreased function. The left ventricle demonstrates  regional wall motion abnormalities (see   scoring diagram/findings for description). There is moderate hypokinesis  of the left ventricular, mid-apical anterior wall and apical segment.   Comparison(s): Changes from prior study are noted. 03/12/2021: LVEF 20-25%.    Ecoho 03/12/21 1. Akinesis of the anteroseptal wall and apex with overall severe LV  dysfunction.   2. Left ventricular ejection fraction, by estimation, is 20 to 25%. The  left ventricle has severely decreased function. The left ventricle  demonstrates regional wall motion abnormalities (see scoring  diagram/findings for description). There is severe  left ventricular hypertrophy. Left ventricular diastolic parameters are  consistent with Grade II diastolic dysfunction  (pseudonormalization).  Elevated left atrial pressure.   3. Right ventricular systolic function is normal. The right ventricular  size is normal.   4. The mitral valve is normal in structure. No evidence of mitral valve  regurgitation. No evidence of mitral stenosis.   5. The aortic valve is tricuspid. Aortic valve regurgitation is not  visualized. No aortic stenosis is present.   6. The inferior vena cava is normal in size with greater than 50%  respiratory variability, suggesting right atrial pressure of 3 mmHg.    Coronary/Graft Acute MI Revascularization  CORONARY STENT INTERVENTION  RIGHT/LEFT HEART CATH AND CORONARY ANGIOGRAPHY   Conclusion      Ost LAD to Prox LAD lesion is 99% stenosed.   Mid LAD lesion is 30% stenosed.   A drug-eluting stent was successfully placed using a STENT ONYX FRONTIER [id number].   Post intervention, there is a 0% residual stenosis.   LV end diastolic pressure is severely elevated.   Hemodynamic findings consistent with moderate pulmonary hypertension.   There is no aortic valve stenosis.   SUMMARY: Ischemic Cardiac Arrest-Ventricular Tachycardia-V. fib Severe single-vessel CAD with ostial LAD 95 to 99% thrombotic stenosis and roughly 30% mid LAD.  TIMI I flow Successful DES PCI of the ostial LAD with Onyx Frontier 3.0 mm x 18 mm postdilated to 3.3 mm -> reducing stenosis to 0% with TIMI-3 flow. ACUTE DIASTOLIC HEART FAILURE with opening LVEDP of 45 mmHg-PCWP post PCI and Lasix reduced to 28 mmHg. Moderate pulmonary hypertension with PA pressures 46/30 mmHg - mean 38 mmHg. Ao sat 98%, PA sat 76%. Cardiac Output-Index: (Fick) 5.96-2.96; (thermal) 5.55-2.76. Successful defibrillation x2-on 2 occasions crossing into the left ventricle, the patient decompensated into polymorphic VT on 1 occasion and VF on another.  Both successfully defibrillated, no CPR required.   RECOMMENDATIONS Admit to CCU with PCCM managing vent. Monitor for Neurologic  Recovery Consider low-dose nitroglycerin infusion for hypertension along with beta-blocker Continue lidocaine for 18 hours, then discontinue Continue amiodarone infusion per protocol and then convert to oral after 24-hour infusion. Continue bicarb drip to be managed by PCCM. Uninterrupted DAPT x 12 months Laboratory Data:  High Sensitivity Troponin:   Recent Labs  Lab 03/12/21 0744 03/13/21 0655 04/08/21 2151 04/08/21 2359 04/09/21 0457  TROPONINIHS >24,000* >24,000* 49* 48* 44*     Chemistry Recent Labs  Lab 04/08/21 2151 04/09/21 0457  NA 132* 135  K 4.5 4.6  CL 106 109  CO2 17* 19*  GLUCOSE 188* 98  BUN 29* 29*  CREATININE 2.39* 2.36*  CALCIUM 9.0 8.8*  MG  --  1.9  GFRNONAA 30* 30*  ANIONGAP 9 7    Recent Labs  Lab 04/09/21 0457  PROT 6.9  ALBUMIN 3.0*  AST 16  ALT 19  ALKPHOS 71  BILITOT 0.4   Lipids No results for input(s): CHOL, TRIG, HDL, LABVLDL, LDLCALC, CHOLHDL in the last 168 hours.  Hematology Recent Labs  Lab 04/08/21 2151 04/09/21 0457  WBC 7.8 7.4  RBC 4.53 4.20*  HGB 14.8 13.5  HCT 43.6 40.6  MCV 96.2 96.7  MCH 32.7 32.1  MCHC 33.9 33.3  RDW 12.8 12.8  PLT 323 273   Thyroid No results for input(s): TSH, FREET4 in the last 168 hours.  BNP Recent Labs  Lab 04/08/21 2151  BNP 65.7    DDimer  Recent Labs  Lab 04/09/21 0457  DDIMER 0.49     Radiology/Studies:  DG Chest Port 1 View  Result Date: 04/08/2021 CLINICAL DATA:  Left-sided chest pain that radiates to the left arm, shortness of breath on exertion. EXAM: PORTABLE CHEST 1 VIEW COMPARISON:  March 16, 2021. FINDINGS: The heart size and mediastinal contours are within normal limits. No focal consolidation. No visible pleural effusion or pneumothorax. The visualized skeletal structures are unremarkable. IMPRESSION: No acute cardiopulmonary disease. Electronically Signed   By: Maudry Mayhew M.D.   On: 04/08/2021 22:18     Assessment and Plan:   Dyspnea on  exertion/chest tightness -Patient reports dyspnea on exertion since discharge.  This is worse than prior to his MI despite discontinuation of smoking tobacco and marijuana.  Intermittent chest tightness radiating across his chest.  He reports compliance with his medication.  No associated palpitation.  Troponin low flat trending down from last admission (49>>48>>44).  EKG with anterior inferior lateral T wave inversion, ?  More pronounced than last admission. -Patient is not volume overloaded, Acutely he is given fluids for dry   -D-dimer normal - Will review with MD. ? Dyspnea due to side effect of Brilinta.   2. CAD/recent anterior STEMI with VF/VT arrest  - S/p DES to LAD -Reports compliance with aspirin and Brilinta -Continue statin and beta-blocker  3.  Ischemic cardiomyopathy -Limited echo prior to discharge showed improved LV function to 40 to 45% from 20-25 at admission. -Patient actually looks dry based on labs.  He has given fluids.  Spironolactone and Farxiga on hold. -Continue Entresto and carvedilol  4.  Acute on chronic kidney disease stage III -Baseline creatinine around 1.7-1.8 -Serum creatinine elevated at 2.39 on admit, given fluids with stable SCr at 2.36 -His home spironolactone, Farxiga and hydrochlorothiazide on hold -Repeat BMP in the morning and readjust medication  Dr. Cristal Deer to see.   Risk Assessment/Risk Scores:   HEAR Score (for undifferentiated chest pain):  HEAR Score: 4{   New York Heart Association (NYHA) Functional Class NYHA Class II      For questions or updates, please contact [facility] Please consult [email or web address] for contact info under    Lorelei Pont, PA  04/09/2021 11:30 AM

## 2021-04-09 NOTE — H&P (Addendum)
History and Physical    Logan Brewer YHC:[id number] DOB: [birth year] DOA: 04/08/2021  PCP: Grayce Sessions, NP  Patient coming from: Home   Chief Complaint:  Chief Complaint  Patient presents with   Chest Pain   Shortness of Breath     HPI:   65 year old male with past medical history of prediabetes, hyperlipidemia, nicotine dependence, marijuana use and recent hospitalization on 9/21 after patient experienced an episode of V. fib arrest in the field with EMS achieving ROSC followed by patient developing recurrent V. fib arrest upon arrival to the ER.  After achieving ROSC patient was found to be suffering from an anterior STEMI with cardiac catheterization revealing a 99% occlusion of the LAD status post DES.  Hospital course in the following days was complicated by shock liver, acute kidney injury and respiratory failure.  Patient slowly recovered and eventually was discharged on 9/29.  Patient explains that since his discharge he has developed progressively worsening shortness of breath.  Patient describes the shortness of breath as moderate in intensity, with occurring with exertion and improved with rest.  Patient denies any associated peripheral edema, paroxysmal nocturnal dyspnea or pillow orthopnea.  Patient reports being compliant with all of his medications.  Patient states that he has quit smoking cigarettes and marijuana since his recent hospitalization.  Patient additionally complains of episodes of chest discomfort, sharp to burning in quality, midsternal in location, moderate to severe in intensity, occurring with exertion and radiating down the left arm.  Patient states that these episodes do not necessarily occur simultaneously with the episodes of shortness of breath.  Patient eventually presented to [name] emergency department for evaluation due to these complaints.  Upon evaluation in the emergency department initial troponins were found to be 49 and 48.   EKG revealed no dynamic ST segment change.  Chest x-ray was found to be unremarkable.  Creatinine was found to be somewhat elevated at 2.39, compared to 1.73 at time of discharge on 9/29 concerning for mild acute kidney injury.  Patient was also felt to be volume depleted.  Patient was initiated on intravenous fluids.  The hospitalist group was then called to assess the patient for admission to the hospital.  Review of Systems:   Review of Systems  Respiratory:  Positive for shortness of breath.   Cardiovascular:  Positive for chest pain.  All other systems reviewed and are negative.  Past Medical History:  Diagnosis Date   Hydrocele in adult    Hypertension    Inguinal hernia    Smoker 03/16/2021   smokes 1/2 ppd PTA per hx    Past Surgical History:  Procedure Laterality Date   APPENDECTOMY     CORONARY STENT INTERVENTION N/A 03/11/2021   Procedure: CORONARY STENT INTERVENTION;  Surgeon: Marykay Lex, MD;  Location: [facility] INVASIVE CV LAB;  Service: Cardiovascular;  Laterality: N/A;   CORONARY/GRAFT ACUTE MI REVASCULARIZATION N/A 03/11/2021   Procedure: Coronary/Graft Acute MI Revascularization;  Surgeon: Marykay Lex, MD;  Location: [facility] INVASIVE CV LAB;  Service: Cardiovascular;  Laterality: N/A;   HYDROCELE EXCISION Bilateral 11/21/2019   Procedure: HYDROCELECTOMY ADULT;  Surgeon: Malen Gauze, MD;  Location: [facility];  Service: Urology;  Laterality: Bilateral;  [id number]   HYDROCELE EXCISION Right 04/07/2020   Procedure: RIGHT HYDROCELECTOMY;  Surgeon: Malen Gauze, MD;  Location: [facility];  Service: Urology;  Laterality: Right;   INGUINAL HERNIA REPAIR Left 11/21/2019   Procedure: HERNIA REPAIR INGUINAL ADULT;  Surgeon: [name],  MD;  Location: [facility];  Service: General;  Laterality: Left;  [id number]   RIGHT/LEFT HEART CATH AND CORONARY ANGIOGRAPHY N/A 03/11/2021   Procedure: RIGHT/LEFT HEART CATH AND CORONARY ANGIOGRAPHY;  Surgeon: Marykay Lex, MD;  Location: [facility]  INVASIVE CV LAB;  Service: Cardiovascular;  Laterality: N/A;     reports that he has been smoking cigarettes. He has been smoking an average of .5 packs per day. He has never used smokeless tobacco. He reports current drug use. Drug: Marijuana. He reports that he does not drink alcohol.  No Known Allergies  Family History  Problem Relation Age of Onset   Heart disease Neg Hx      Prior to Admission medications   Medication Sig Start Date End Date Taking? Authorizing Provider  amLODipine (NORVASC) 10 MG tablet Take 1 tablet (10 mg total) by mouth daily. 11/04/20   Grayce Sessions, NP  aspirin EC 81 MG EC tablet Take 1 tablet (81 mg total) by mouth daily. Swallow whole. 03/19/21   Zannie Cove, MD  atorvastatin (LIPITOR) 80 MG tablet Take 1 tablet (80 mg total) by mouth daily. 03/19/21   Zannie Cove, MD  carvedilol (COREG) 12.5 MG tablet Take 1 tablet (12.5 mg total) by mouth 2 (two) times daily with a meal. 11/04/20   Grayce Sessions, NP  carvedilol (COREG) 6.25 MG tablet Take 1 tablet (6.25 mg total) by mouth 2 (two) times daily with a meal. 03/19/21   Zannie Cove, MD  celecoxib (CELEBREX) 100 MG capsule Take 1 capsule (100 mg total) by mouth 2 (two) times daily. 11/25/20   Grayce Sessions, NP  dapagliflozin propanediol (FARXIGA) 10 MG TABS tablet Take 1 tablet (10 mg total) by mouth daily. 03/20/21   Zannie Cove, MD  hydrochlorothiazide (HYDRODIURIL) 25 MG tablet Take 1 tablet (25 mg total) by mouth daily. 11/04/20   Grayce Sessions, NP  HYDROcodone-acetaminophen (NORCO/VICODIN) 5-325 MG tablet One tablet every four hours for pain. 07/24/20   Darreld Mclean, MD  HYDROcodone-acetaminophen (NORCO/VICODIN) 5-325 MG tablet Take 1 tablet by mouth every 4 (four) hours as needed for moderate pain. 01/13/21   Nadara Mustard, MD  PARoxetine (PAXIL) 10 MG tablet TAKE 1 TABLET BY MOUTH EVERY DAY AS NEEDED 07/28/20   McKenzie, Mardene Celeste, MD  potassium chloride SA (KLOR-CON) 20 MEQ  tablet Take 2 tablets (40 mEq total) by mouth daily. 03/19/21   Zannie Cove, MD  pravastatin (PRAVACHOL) 20 MG tablet Take 1 tablet (20 mg total) by mouth daily. 11/06/20   Grayce Sessions, NP  predniSONE (DELTASONE) 10 MG tablet Take 1 tablet (10 mg total) by mouth daily with breakfast. 12/04/20   Nadara Mustard, MD  sacubitril-valsartan (ENTRESTO) 97-103 MG Take 1 tablet by mouth 2 (two) times daily. 03/19/21   Zannie Cove, MD  spironolactone (ALDACTONE) 25 MG tablet Take 1 tablet (25 mg total) by mouth daily. 03/20/21   Zannie Cove, MD  ticagrelor (BRILINTA) 90 MG TABS tablet Take 1 tablet (90 mg total) by mouth 2 (two) times daily. 03/19/21   Zannie Cove, MD    Physical Exam: Vitals:   04/08/21 2245 04/09/21 0015 04/09/21 0030 04/09/21 0045  BP: 116/69 137/63 124/71 (!) 149/79  Pulse: 71 63 65 69  Resp: (!) 23 (!) 22 (!) 27 20  Temp:      TempSrc:      SpO2: 97% 98% 97% 99%    Constitutional: Awake alert and oriented x3, no associated distress.   Skin:  no rashes, no lesions, poor skin turgor noted. Eyes: Pupils are equally reactive to light.  No evidence of scleral icterus or conjunctival pallor.  ENMT: Dry mucous membranes noted.  Posterior pharynx clear of any exudate or lesions.   Neck: normal, supple, no masses, no thyromegaly.  No evidence of jugular venous distension.   Respiratory: clear to auscultation bilaterally, no wheezing, no crackles. Normal respiratory effort. No accessory muscle use.  Cardiovascular: Regular rate and rhythm, no murmurs / rubs / gallops. No extremity edema. 2+ pedal pulses. No carotid bruits.  Chest:   Nontender without crepitus or deformity.   Back:   Nontender without crepitus or deformity. Abdomen: Abdomen is soft and nontender.  No evidence of intra-abdominal masses.  Positive bowel sounds noted in all quadrants.   Musculoskeletal: No joint deformity upper and lower extremities. Good ROM, no contractures. Normal muscle tone.   Neurologic: CN 2-12 grossly intact. Sensation intact.  Patient moving all 4 extremities spontaneously.  Patient is following all commands.  Patient is responsive to verbal stimuli.   Psychiatric: Patient exhibits normal mood with appropriate affect.  Patient seems to possess insight as to their current situation.     Labs on Admission: I have personally reviewed following labs and imaging studies -   CBC: Recent Labs  Lab 04/08/21 2151  WBC 7.8  HGB 14.8  HCT 43.6  MCV 96.2  PLT 323   Basic Metabolic Panel: Recent Labs  Lab 04/08/21 2151  NA 132*  K 4.5  CL 106  CO2 17*  GLUCOSE 188*  BUN 29*  CREATININE 2.39*  CALCIUM 9.0   GFR: CrCl cannot be calculated (Unknown ideal weight.). Liver Function Tests: No results for input(s): AST, ALT, ALKPHOS, BILITOT, PROT, ALBUMIN in the last 168 hours. No results for input(s): LIPASE, AMYLASE in the last 168 hours. No results for input(s): AMMONIA in the last 168 hours. Coagulation Profile: No results for input(s): INR, PROTIME in the last 168 hours. Cardiac Enzymes: No results for input(s): CKTOTAL, CKMB, CKMBINDEX, TROPONINI in the last 168 hours. BNP (last 3 results) No results for input(s): PROBNP in the last 8760 hours. HbA1C: No results for input(s): HGBA1C in the last 72 hours. CBG: No results for input(s): GLUCAP in the last 168 hours. Lipid Profile: No results for input(s): CHOL, HDL, LDLCALC, TRIG, CHOLHDL, LDLDIRECT in the last 72 hours. Thyroid Function Tests: No results for input(s): TSH, T4TOTAL, FREET4, T3FREE, THYROIDAB in the last 72 hours. Anemia Panel: No results for input(s): VITAMINB12, FOLATE, FERRITIN, TIBC, IRON, RETICCTPCT in the last 72 hours. Urine analysis:    Component Value Date/Time   COLORURINE YELLOW 03/12/2021 0024   APPEARANCEUR CLEAR 03/12/2021 0024   APPEARANCEUR Clear 07/03/2020 1203   LABSPEC 1.010 03/12/2021 0024   PHURINE 6.0 03/12/2021 0024   GLUCOSEU 100 (A) 03/12/2021 0024    HGBUR LARGE (A) 03/12/2021 0024   BILIRUBINUR NEGATIVE 03/12/2021 0024   BILIRUBINUR Negative 07/03/2020 1203   KETONESUR NEGATIVE 03/12/2021 0024   PROTEINUR NEGATIVE 03/12/2021 0024   UROBILINOGEN 1.0 04/25/2013 1237   NITRITE NEGATIVE 03/12/2021 0024   LEUKOCYTESUR NEGATIVE 03/12/2021 0024    Radiological Exams on Admission - Personally Reviewed: DG Chest Port 1 View  Result Date: 04/08/2021 CLINICAL DATA:  Left-sided chest pain that radiates to the left arm, shortness of breath on exertion. EXAM: PORTABLE CHEST 1 VIEW COMPARISON:  March 16, 2021. FINDINGS: The heart size and mediastinal contours are within normal limits. No focal consolidation. No visible pleural effusion or  pneumothorax. The visualized skeletal structures are unremarkable. IMPRESSION: No acute cardiopulmonary disease. Electronically Signed   By: Maudry Mayhew M.D.   On: 04/08/2021 22:18    EKG: Personally reviewed.  Rhythm is normal sinus rhythm with heart rate of 93 bpm.  Evidence of diffuse T wave inversions no dynamic ST segment changes appreciated.  Assessment/Plan  * Acute kidney injury [facility]) Patient presenting with what appears to be mild acute kidney injury with creatinine up to 2.39, up from 1.73 on 9/29. Clinically, patient appears to be slightly volume depleted with poor skin turgor and dry mucous membranes Labs reveal a mild hyponatremia further suggestive of a hypovolemic hyponatremia. Patient has recently initiated spironolactone which will be temporarily held at this time Hydrating patient gently with intravenous lactated Ringer solution Strict input output monitoring Monitoring renal function and electrolytes with serial, chemistries Avoiding nephrotoxic agents if at all possible  Dyspnea on exertion Chest x-ray clear BNP unremarkable No evidence of clinical volume overload on exam No clinical evidence of pneumonia D-dimer pending and will obtain VQ scan if this is elevated No evidence of  wheezing on exam but will attempt bronchodilator therapy  Chest pain Patient complaining of chest discomfort with typical and atypical features. While chest discomfort is exertional, it seems to be intermittent.  Patient also describes it as "burning" and sometimes "sharp". Cycling cardiac enzymes, troponins are only minimally elevated. Monitoring patient on telemetry Considering patient's complaints of a burning sensation will initiate Protonix in case patient is experiencing some degree of dyspepsia or gastritis with the dual antiplatelet therapy he has recently been placed on. We will additionally obtain D-dimer and obtain VQ scan if markedly elevated We will temporarily make patient n.p.o. and review case with cardiology in the morning to determine if whether or not a noninvasive ischemic assessment is warranted.   Mixed hyperlipidemia Continue home regimen of statin therapy  Dehydration with hyponatremia Please see assessment and plan above  Coronary artery disease involving native coronary artery of native heart with angina pectoris (HCC) Please see assessment and plan above  Nicotine dependence, cigarettes, uncomplicated Patient reports quitting since his last hospitalization Patient has been congratulated on this and will continue to encourage cessation       Code Status:  Full code  code status decision has been confirmed with: patient Family Communication: deferred   Status is: Observation  The patient remains OBS appropriate and will d/c before 2 midnights.       Marinda Elk MD [facility] Pager [phone]  If 7PM-7AM, please contact night-coverage [email or web address] Use universal [place] password for that web site. If you do not have the password, please call the hospital operator.  04/09/2021, 4:15 AM

## 2021-04-09 NOTE — Assessment & Plan Note (Signed)
   Continue home regimen of statin therapy 

## 2021-04-10 ENCOUNTER — Inpatient Hospital Stay (HOSPITAL_COMMUNITY): Payer: Self-pay

## 2021-04-10 DIAGNOSIS — R0602 Shortness of breath: Secondary | ICD-10-CM

## 2021-04-10 LAB — BASIC METABOLIC PANEL
Anion gap: 5 (ref 5–15)
BUN: 26 mg/dL — ABNORMAL HIGH (ref 8–23)
CO2: 23 mmol/L (ref 22–32)
Calcium: 9.1 mg/dL (ref 8.9–10.3)
Chloride: 107 mmol/L (ref 98–111)
Creatinine, Ser: 2.34 mg/dL — ABNORMAL HIGH (ref 0.61–1.24)
GFR, Estimated: 30 mL/min — ABNORMAL LOW (ref >60)
Glucose, Bld: 101 mg/dL — ABNORMAL HIGH (ref 70–99)
Potassium: 4.8 mmol/L (ref 3.5–5.1)
Sodium: 135 mmol/L (ref 135–145)

## 2021-04-10 LAB — ECHOCARDIOGRAM LIMITED
Calc EF: 53 %
Height: 69 in
S' Lateral: 3.2 cm
Single Plane A2C EF: 50.6 %
Single Plane A4C EF: 52.6 %
Weight: 2723.2 oz

## 2021-04-10 MED ORDER — PERFLUTREN LIPID MICROSPHERE
1.0000 mL | INTRAVENOUS | Status: AC | PRN
  Administered 2021-04-10: 4 mL via INTRAVENOUS
  Filled 2021-04-10: qty 10

## 2021-04-10 NOTE — Progress Notes (Signed)
PROGRESS NOTE    Logan Brewer  YTK:[id number] DOB: 02-05-1956 DOA: 04/08/2021 PCP: Grayce Sessions, NP   Brief Narrative:  HPI:   65 year old male with past medical history of prediabetes, hyperlipidemia, nicotine dependence, marijuana use and recent hospitalization on 9/21 after patient experienced an episode of V. fib arrest in the field with EMS achieving ROSC followed by patient developing recurrent V. fib arrest upon arrival to the ER.  After achieving ROSC patient was found to be suffering from an anterior STEMI with cardiac catheterization revealing a 99% occlusion of the LAD status post DES.  Hospital course in the following days was complicated by shock liver, acute kidney injury and respiratory failure.  Patient slowly recovered and eventually was discharged on 9/29.   Patient explains that since his discharge he has developed progressively worsening shortness of breath.  Patient describes the shortness of breath as moderate in intensity, with occurring with exertion and improved with rest.  Patient denies any associated peripheral edema, paroxysmal nocturnal dyspnea or pillow orthopnea.  Patient reports being compliant with all of his medications.  Patient states that he has quit smoking cigarettes and marijuana since his recent hospitalization.   Patient additionally complains of episodes of chest discomfort, sharp to burning in quality, midsternal in location, moderate to severe in intensity, occurring with exertion and radiating down the left arm.  Patient states that these episodes do not necessarily occur simultaneously with the episodes of shortness of breath.  Patient eventually presented to [facility] emergency department for evaluation due to these complaints.   Upon evaluation in the emergency department initial troponins were found to be 49 and 48.  EKG revealed no dynamic ST segment change.  Chest x-ray was found to be unremarkable.  Creatinine was found to be  somewhat elevated at 2.39, compared to 1.73 at time of discharge on 9/29 concerning for mild acute kidney injury.  Patient was also felt to be volume depleted.  Patient was initiated on intravenous fluids.  The hospitalist group was then called to assess the patient for admission to the hospital.  04/10/2021: Patient seen.  Also discussed with the patient's nurse extensively.  Cardiology input is highly appreciated.  Patient denies chest pain in department.  Cardiology is directing care.  Impaired renal function is noted.  Nephrotoxic medications are on hold.  Renal function will be monitored closely.  Low threshold to consult nephrology prior to any contrast dye exposure.  Assessment & Plan:   Principal Problem:   Acute kidney injury (HCC) Active Problems:   Coronary artery disease involving native coronary artery of native heart with angina pectoris (HCC)   Chest pain   Dehydration with hyponatremia   Mixed hyperlipidemia   Nicotine dependence, cigarettes, uncomplicated   Dyspnea on exertion  History of CAD s/p DES in LAD during recent hospitalization/dyspnea on exertion/chest pain: Chest x-ray unremarkable.  BNP unremarkable.  Lungs clear to auscultation.  D-dimer unremarkable.  Patient complains of intermittent chest pain on the left side and calls and crampy chest pain.  Shortness of breath even with talking, he claims but he appears clinically stable and does not seem to have any dyspnea.  Troponin flat, much improved, not indicative of ACS.  Echo recently done during prior hospitalization on 03/16/2021 shows 40 to 45% ejection fraction and moderate hypokinesis of the left ventricle.  Cardiology has been consulted for further management.  He is on aspirin, Coreg and Brilinta.  Also on Entresto. 04/10/2021: Chest pain-free today.  Nephrotoxic medications  on hold.  Renal function being monitored closely.  AKI on CKD stage IIIb: Baseline creatinine seems to be around 1.7-1.8.  Presented with  2.39 which is still 2.36 today.  Continue IV fluids.  Repeat labs in the morning. 04/10/2021: Monitor renal function and electrolytes.  Low threshold to consult nephrology team.  Mixed hyperlipidemia: Continue home statin.  Nicotine dependence/cigarettes: Counseling for quitting provided.  DVT prophylaxis: enoxaparin (LOVENOX) injection 40 mg Start: 04/09/21 1000   Code Status: Full Code  Family Communication:  None present at bedside.  Plan of care discussed with patient in length and he verbalized understanding and agreed with it.  Status is: Observation  The patient will require care spanning > 2 midnights and should be moved to inpatient because: Needs further work-up.   Estimated body mass index is 25.13 kg/m as calculated from the following:   Height as of this encounter: 5\' 9"  (1.753 m).   Weight as of this encounter: 77.2 kg.     Nutritional Assessment: Body mass index is 25.13 kg/m. Seen by dietician.  I agree with the assessment and plan as outlined below: Nutrition Status:   Skin Assessment: I have examined the patient's skin and I agree with the wound assessment as performed by the wound care RN as outlined below:    Consultants:  Cardiology  Procedures:  None  Antimicrobials:  Anti-infectives (From admission, onward)    None          Subjective: No chest pain. No shortness of breath.  Objective: Vitals:   04/10/21 1040 04/10/21 1110 04/10/21 1310 04/10/21 1335  BP: 123/76 106/77 97/64 (!) 100/57  Pulse: 87 90 83 75  Resp: 18  18   Temp:   98.4 F (36.9 C)   TempSrc:   Oral   SpO2: 98% 98%  96%  Weight:      Height:        Intake/Output Summary (Last 24 hours) at 04/10/2021 1544 Last data filed at 04/09/2021 2006 Gross per 24 hour  Intake 1000 ml  Output --  Net 1000 ml    Filed Weights   04/09/21 2110  Weight: 77.2 kg    Examination:  General exam: Appears calm and comfortable.   Respiratory system: Clear to  auscultation.  Cardiovascular system: S1 & S2 Gastrointestinal system: Abdomen is nondistended, soft and nontender. No organomegaly or masses felt. Normal bowel sounds heard. Central nervous system: Alert and oriented. No focal neurological deficits. Extremities: No leg edema.  Data Reviewed: I have personally reviewed following labs and imaging studies  CBC: Recent Labs  Lab 04/08/21 2151 04/09/21 0457  WBC 7.8 7.4  NEUTROABS  --  3.3  HGB 14.8 13.5  HCT 43.6 40.6  MCV 96.2 96.7  PLT 323 273    Basic Metabolic Panel: Recent Labs  Lab 04/08/21 2151 04/09/21 0457 04/10/21 0159  NA 132* 135 135  K 4.5 4.6 4.8  CL 106 109 107  CO2 17* 19* 23  GLUCOSE 188* 98 101*  BUN 29* 29* 26*  CREATININE 2.39* 2.36* 2.34*  CALCIUM 9.0 8.8* 9.1  MG  --  1.9  --     GFR: Estimated Creatinine Clearance: 32.3 mL/min (A) (by C-G formula based on SCr of 2.34 mg/dL (H)). Liver Function Tests: Recent Labs  Lab 04/09/21 0457  AST 16  ALT 19  ALKPHOS 71  BILITOT 0.4  PROT 6.9  ALBUMIN 3.0*    No results for input(s): LIPASE, AMYLASE in the last 168 hours.  No results for input(s): AMMONIA in the last 168 hours. Coagulation Profile: No results for input(s): INR, PROTIME in the last 168 hours. Cardiac Enzymes: No results for input(s): CKTOTAL, CKMB, CKMBINDEX, TROPONINI in the last 168 hours. BNP (last 3 results) No results for input(s): PROBNP in the last 8760 hours. HbA1C: No results for input(s): HGBA1C in the last 72 hours. CBG: No results for input(s): GLUCAP in the last 168 hours. Lipid Profile: No results for input(s): CHOL, HDL, LDLCALC, TRIG, CHOLHDL, LDLDIRECT in the last 72 hours. Thyroid Function Tests: No results for input(s): TSH, T4TOTAL, FREET4, T3FREE, THYROIDAB in the last 72 hours. Anemia Panel: No results for input(s): VITAMINB12, FOLATE, FERRITIN, TIBC, IRON, RETICCTPCT in the last 72 hours. Sepsis Labs: No results for input(s): PROCALCITON, LATICACIDVEN  in the last 168 hours.  Recent Results (from the past 240 hour(s))  Resp Panel by RT-PCR (Flu A&B, Covid) Nasopharyngeal Swab     Status: None   Collection Time: 04/08/21 10:08 PM   Specimen: Nasopharyngeal Swab; Nasopharyngeal(NP) swabs in vial transport medium  Result Value Ref Range Status   SARS Coronavirus 2 by RT PCR NEGATIVE NEGATIVE Final    Comment: (NOTE) SARS-CoV-2 target nucleic acids are NOT DETECTED.  The SARS-CoV-2 RNA is generally detectable in upper respiratory specimens during the acute phase of infection. The lowest concentration of SARS-CoV-2 viral copies this assay can detect is 138 copies/mL. A negative result does not preclude SARS-Cov-2 infection and should not be used as the sole basis for treatment or other patient management decisions. A negative result may occur with  improper specimen collection/handling, submission of specimen other than nasopharyngeal swab, presence of viral mutation(s) within the areas targeted by this assay, and inadequate number of viral copies(<138 copies/mL). A negative result must be combined with clinical observations, patient history, and epidemiological information. The expected result is Negative.  Fact Sheet for Patients:  BloggerCourse.com  Fact Sheet for Healthcare Providers:  SeriousBroker.it  This test is no t yet approved or cleared by the [place] FDA and  has been authorized for detection and/or diagnosis of SARS-CoV-2 by FDA under an Emergency Use Authorization (EUA). This EUA will remain  in effect (meaning this test can be used) for the duration of the COVID-19 declaration under Section 564(b)(1) of the Act, 21 U.S.C.section 360bbb-3(b)(1), unless the authorization is terminated  or revoked sooner.       Influenza A by PCR NEGATIVE NEGATIVE Final   Influenza B by PCR NEGATIVE NEGATIVE Final    Comment: (NOTE) The Xpert Xpress SARS-CoV-2/FLU/RSV plus  assay is intended as an aid in the diagnosis of influenza from Nasopharyngeal swab specimens and should not be used as a sole basis for treatment. Nasal washings and aspirates are unacceptable for Xpert Xpress SARS-CoV-2/FLU/RSV testing.  Fact Sheet for Patients: BloggerCourse.com  Fact Sheet for Healthcare Providers: SeriousBroker.it  This test is not yet approved or cleared by the [place] FDA and has been authorized for detection and/or diagnosis of SARS-CoV-2 by FDA under an Emergency Use Authorization (EUA). This EUA will remain in effect (meaning this test can be used) for the duration of the COVID-19 declaration under Section 564(b)(1) of the Act, 21 U.S.C. section 360bbb-3(b)(1), unless the authorization is terminated or revoked.  Performed at [facility], [address] [place], [place] [address]        Radiology Studies: DG Chest Port 1 View  Result Date: 04/08/2021 CLINICAL DATA:  Left-sided chest pain that radiates to the left  arm, shortness of breath on exertion. EXAM: PORTABLE CHEST 1 VIEW COMPARISON:  March 16, 2021. FINDINGS: The heart size and mediastinal contours are within normal limits. No focal consolidation. No visible pleural effusion or pneumothorax. The visualized skeletal structures are unremarkable. IMPRESSION: No acute cardiopulmonary disease. Electronically Signed   By: Maudry Mayhew M.D.   On: 04/08/2021 22:18   ECHOCARDIOGRAM LIMITED  Result Date: 04/10/2021    ECHOCARDIOGRAM LIMITED REPORT   Patient Name:   DAMARKUS PAGANO Date of Exam: 04/10/2021 Medical Rec #:  [id number]       Height:       69.0 in Accession #:    [id number]      Weight:       170.2 lb Date of Birth:  1956/03/31        BSA:          1.929 m Patient Age:    65 years        BP:           139/69 mmHg Patient Gender: M               HR:           75 bpm. Exam Location:  Inpatient Procedure: 2D Echo and Intracardiac  Opacification Agent Indications:    SOB  History:        Patient has prior history of Echocardiogram examinations, most                 recent 03/16/2021. Risk Factors:Former Smoker and Hypertension.  Sonographer:    Alvera Novel Referring Phys: [id number] Sharrell Ku BHAGAT IMPRESSIONS  1. Limited study to assess LV function; anteroseptal akinesis and hypokinesis of the apex; overall mild to moderate LV dysfunction; unchanged compared to 03/16/21.  2. Left ventricular ejection fraction, by estimation, is 40 to 45%. The left ventricle has mild to moderately decreased function. The left ventricle demonstrates regional wall motion abnormalities (see scoring diagram/findings for description). There is  mild left ventricular hypertrophy of the basal-septal segment.  3. Right ventricular systolic function is normal. The right ventricular size is normal.  4. The mitral valve is normal in structure. FINDINGS  Left Ventricle: Left ventricular ejection fraction, by estimation, is 40 to 45%. The left ventricle has mild to moderately decreased function. The left ventricle demonstrates regional wall motion abnormalities. Definity contrast agent was given IV to delineate the left ventricular endocardial borders. The left ventricular internal cavity size was normal in size. There is mild left ventricular hypertrophy of the basal-septal segment. Right Ventricle: The right ventricular size is normal. Right ventricular systolic function is normal. Left Atrium: Left atrial size was normal in size. Right Atrium: Right atrial size was normal in size. Pericardium: There is no evidence of pericardial effusion. Mitral Valve: The mitral valve is normal in structure. Tricuspid Valve: The tricuspid valve is normal in structure. Aorta: The aortic root is normal in size and structure. IAS/Shunts: No atrial level shunt detected by color flow Doppler. Additional Comments: Limited study to assess LV function; anteroseptal akinesis and hypokinesis  of the apex; overall mild to moderate LV dysfunction; unchanged compared to 03/16/21. LEFT VENTRICLE PLAX 2D LVIDd:         4.00 cm LVIDs:         3.20 cm LV PW:         1.50 cm LV IVS:        1.50 cm  LV Volumes (MOD) LV vol d, MOD A2C: 86.3 ml LV  vol d, MOD A4C: 121.0 ml LV vol s, MOD A2C: 42.6 ml LV vol s, MOD A4C: 57.4 ml LV SV MOD A2C:     43.7 ml LV SV MOD A4C:     121.0 ml LV SV MOD BP:      56.2 ml Olga Millers MD Electronically signed by Olga Millers MD Signature Date/Time: 04/10/2021/2:14:20 PM    Final     Scheduled Meds:  aspirin EC  81 mg Oral Daily   atorvastatin  80 mg Oral Daily   carvedilol  6.25 mg Oral BID WC   enoxaparin (LOVENOX) injection  40 mg Subcutaneous Q24H   pantoprazole  40 mg Oral Daily   ticagrelor  90 mg Oral BID   Continuous Infusions:     LOS: 1 day   Time spent: 35 minutes   Barnetta Chapel, MD [facility]  04/10/2021, 3:44 PM  Please page via Loretha Stapler and do not message via secure chat for anything urgent. Secure chat can be used for anything non urgent.  How to contact the [facility] Attending or Consulting provider 7A - 7P or covering provider during after hours 7P -7A, for this patient?  Check the care team in [facility] and look for a) attending/consulting [facility] provider listed and b) the [facility] team listed. Page or secure chat 7A-7P. Log into [email or web address] and use 's universal password to access. If you do not have the password, please contact the hospital operator. Locate the [facility] provider you are looking for under [facility] and page to a number that you can be directly reached. If you still have difficulty reaching the provider, please page the [facility] (Director on Call) for the Hospitalists listed on amion for assistance.

## 2021-04-10 NOTE — Progress Notes (Signed)
Progress Note  Patient Name: Logan Brewer Date of Encounter: 04/10/2021  [facility] Cardiologist: Bryan Lemma, MD   Subjective   No acute events overnight. No chest pain. Has not had shortness of breath, but has remained in the bed for the most part.  Inpatient Medications    Scheduled Meds:  aspirin EC  81 mg Oral Daily   atorvastatin  80 mg Oral Daily   carvedilol  6.25 mg Oral BID WC   enoxaparin (LOVENOX) injection  40 mg Subcutaneous Q24H   pantoprazole  40 mg Oral Daily   ticagrelor  90 mg Oral BID   Continuous Infusions:  PRN Meds: acetaminophen **OR** acetaminophen, hydrALAZINE, ipratropium-albuterol, melatonin, nitroGLYCERIN, ondansetron **OR** ondansetron (ZOFRAN) IV, polyethylene glycol   Vital Signs    Vitals:   04/09/21 2314 04/10/21 0058 04/10/21 0108 04/10/21 0308  BP: 112/77 116/68 116/70 139/69  Pulse: 71 63 63 63  Resp:  16    Temp:  98.6 F (37 C)  98.6 F (37 C)  TempSrc:  Oral  Oral  SpO2: 99% 97% 95% 98%  Weight:      Height:        Intake/Output Summary (Last 24 hours) at 04/10/2021 1050 Last data filed at 04/09/2021 2006 Gross per 24 hour  Intake 1000 ml  Output --  Net 1000 ml   Last 3 Weights 04/09/2021 03/15/2021 03/15/2021  Weight (lbs) 170 lb 3.2 oz 178 lb 3.2 oz 178 lb 12.7 oz  Weight (kg) 77.202 kg 80.831 kg 81.1 kg      Telemetry    SR with occasional ectopy - Personally Reviewed  ECG    No new since 10/19 - Personally Reviewed  Physical Exam   GEN: No acute distress.   Neck: No JVD Cardiac: RRR, no murmurs, rubs, or gallops.  Respiratory: Clear to auscultation bilaterally. GI: Soft, nontender, non-distended  MS: No edema; No deformity. Neuro:  Nonfocal  Psych: Normal affect   Labs    High Sensitivity Troponin:   Recent Labs  Lab 03/12/21 0744 03/13/21 0655 04/08/21 2151 04/08/21 2359 04/09/21 0457  TROPONINIHS >24,000* >24,000* 49* 48* 44*     Chemistry Recent Labs  Lab 04/08/21 2151  04/09/21 0457 04/10/21 0159  NA 132* 135 135  K 4.5 4.6 4.8  CL 106 109 107  CO2 17* 19* 23  GLUCOSE 188* 98 101*  BUN 29* 29* 26*  CREATININE 2.39* 2.36* 2.34*  CALCIUM 9.0 8.8* 9.1  MG  --  1.9  --   PROT  --  6.9  --   ALBUMIN  --  3.0*  --   AST  --  16  --   ALT  --  19  --   ALKPHOS  --  71  --   BILITOT  --  0.4  --   GFRNONAA 30* 30* 30*  ANIONGAP 9 7 5     Lipids No results for input(s): CHOL, TRIG, HDL, LABVLDL, LDLCALC, CHOLHDL in the last 168 hours.  Hematology Recent Labs  Lab 04/08/21 2151 04/09/21 0457  WBC 7.8 7.4  RBC 4.53 4.20*  HGB 14.8 13.5  HCT 43.6 40.6  MCV 96.2 96.7  MCH 32.7 32.1  MCHC 33.9 33.3  RDW 12.8 12.8  PLT 323 273   Thyroid No results for input(s): TSH, FREET4 in the last 168 hours.  BNP Recent Labs  Lab 04/08/21 2151  BNP 65.7    DDimer  Recent Labs  Lab 04/09/21 0457  DDIMER 0.49  Radiology    DG Chest Port 1 View  Result Date: 04/08/2021 CLINICAL DATA:  Left-sided chest pain that radiates to the left arm, shortness of breath on exertion. EXAM: PORTABLE CHEST 1 VIEW COMPARISON:  March 16, 2021. FINDINGS: The heart size and mediastinal contours are within normal limits. No focal consolidation. No visible pleural effusion or pneumothorax. The visualized skeletal structures are unremarkable. IMPRESSION: No acute cardiopulmonary disease. Electronically Signed   By: Maudry Mayhew M.D.   On: 04/08/2021 22:18    Cardiac Studies   Echo pending today  Patient Profile     65 y.o. male history of CAD with recent admission for acute STEMI and VT/VF arrest in 02/2021, with cardiogenic shock requiring inotropes. Initial EF 20-25%, improved to 40-45%. Presented with worsening shortness of breath with exertion, found to have acute kidney injury.  Assessment & Plan    Acute kidney unjury: -suspect this may be due to his outpatient regimen. Will hold spironolactone, HCTZ and farxiga. Will hold entresto as well. Using PRN  hydralazine in the interim if BP elevates -Cr stable at 2.34 today, peak 2.39 on admission, baseline 1.6-1.7   Chest pain, shortness of breath -hsTnI not consistent with ACS -will get limited echo, pending -CXR clear -if echo significantly worse than prior, may need to consider cath. However, need renal function to improve as well prior to cath -no clear relationship to ticagrelor -no symptoms since admission. Will have him ambulate, monitor O2 sats and symptoms   Recent anterior STEMI: continue aspirin, ticagrelor, carvedilol  For questions or updates, please contact [facility] Please consult [email or web address] for contact info under     Signed, Jodelle Red, MD  04/10/2021, 10:50 AM

## 2021-04-10 NOTE — Progress Notes (Signed)
Pt walked with Joyice Faster, NT. Pt tolerated well and did not express SHOB. When reviewing O2 sat it seen that pt's O2 dropped as low as 91%. Spoke with pt and reports that he felt okay walking, but did admit that he had SHOB last night.

## 2021-04-10 NOTE — Care Management (Signed)
1428 04-10-21 Hospital follow up appointment scheduled and placed on the AVS. Case Manager will follow for MATCH needs.

## 2021-04-10 NOTE — Plan of Care (Signed)
  Problem: Education: Goal: Understanding of cardiac disease, CV risk reduction, and recovery process will improve 04/10/2021 0149 by Arnette Felts, RN Outcome: Progressing 04/10/2021 0148 by Arnette Felts, RN Outcome: Progressing Goal: Individualized Educational Video(s) 04/10/2021 0149 by Arnette Felts, RN Outcome: Progressing 04/10/2021 0148 by Arnette Felts, RN Outcome: Progressing   Problem: Activity: Goal: Ability to tolerate increased activity will improve 04/10/2021 0149 by Arnette Felts, RN Outcome: Progressing 04/10/2021 0148 by Arnette Felts, RN Outcome: Progressing   Problem: Cardiac: Goal: Ability to achieve and maintain adequate cardiovascular perfusion will improve 04/10/2021 0149 by Arnette Felts, RN Outcome: Progressing 04/10/2021 0148 by Arnette Felts, RN Outcome: Progressing   Problem: Health Behavior/Discharge Planning: Goal: Ability to safely manage health-related needs after discharge will improve 04/10/2021 0149 by Arnette Felts, RN Outcome: Progressing 04/10/2021 0148 by Arnette Felts, RN Outcome: Progressing   Problem: Education: Goal: Knowledge of General Education information will improve Description: Including pain rating scale, medication(s)/side effects and non-pharmacologic comfort measures Outcome: Progressing   Problem: Health Behavior/Discharge Planning: Goal: Ability to manage health-related needs will improve Outcome: Progressing   Problem: Clinical Measurements: Goal: Ability to maintain clinical measurements within normal limits will improve Outcome: Progressing Goal: Will remain free from infection Outcome: Progressing Goal: Diagnostic test results will improve Outcome: Progressing Goal: Respiratory complications will improve Outcome: Progressing Goal: Cardiovascular complication will be avoided Outcome: Progressing   Problem: Activity: Goal: Risk for activity intolerance  will decrease Outcome: Progressing   Problem: Nutrition: Goal: Adequate nutrition will be maintained Outcome: Progressing   Problem: Coping: Goal: Level of anxiety will decrease Outcome: Progressing   Problem: Pain Managment: Goal: General experience of comfort will improve Outcome: Progressing   Problem: Safety: Goal: Ability to remain free from injury will improve Outcome: Progressing   Problem: Skin Integrity: Goal: Risk for impaired skin integrity will decrease Outcome: Progressing

## 2021-04-10 NOTE — Plan of Care (Signed)

## 2021-04-11 LAB — RENAL FUNCTION PANEL
Albumin: 3.2 g/dL — ABNORMAL LOW (ref 3.5–5.0)
Anion gap: 9 (ref 5–15)
BUN: 26 mg/dL — ABNORMAL HIGH (ref 8–23)
CO2: 18 mmol/L — ABNORMAL LOW (ref 22–32)
Calcium: 9.1 mg/dL (ref 8.9–10.3)
Chloride: 107 mmol/L (ref 98–111)
Creatinine, Ser: 2.38 mg/dL — ABNORMAL HIGH (ref 0.61–1.24)
GFR, Estimated: 30 mL/min — ABNORMAL LOW (ref >60)
Glucose, Bld: 108 mg/dL — ABNORMAL HIGH (ref 70–99)
Phosphorus: 3.7 mg/dL (ref 2.5–4.6)
Potassium: 4.3 mmol/L (ref 3.5–5.1)
Sodium: 134 mmol/L — ABNORMAL LOW (ref 135–145)

## 2021-04-11 LAB — MAGNESIUM: Magnesium: 1.9 mg/dL (ref 1.7–2.4)

## 2021-04-11 MED ORDER — NITROGLYCERIN 0.4 MG SL SUBL: 0.4000 mg | SUBLINGUAL_TABLET | SUBLINGUAL | 0 refills | Status: DC | PRN

## 2021-04-11 MED ORDER — HYDRALAZINE HCL 10 MG PO TABS: 10.0000 mg | ORAL_TABLET | Freq: Four times a day (QID) | ORAL | 0 refills | Status: DC | PRN

## 2021-04-11 NOTE — Discharge Summary (Signed)
Physician Discharge Summary  Patient ID: Logan Brewer MRN: [id number] DOB/AGE: Nov 13, 1955 65 y.o.  Admit date: 04/08/2021 Discharge date: 04/11/2021  Admission Diagnoses:  Discharge Diagnoses:  Principal Problem: Acute kidney injury on chronic kidney disease stage IIIa/b Chest pain.  Active Problems:   Coronary artery disease involving native coronary artery of native heart with angina pectoris (HCC)   Chest pain   Dehydration with hyponatremia   Mixed hyperlipidemia   Nicotine dependence, cigarettes, uncomplicated   Dyspnea on exertion   Discharged Condition: stable  Hospital Course: Patient is a 65 year old African-American male with past medical history significant for hyperlipidemia, prediabetes, tobacco use disorder and recent admission for V. fib arrest secondary to anterior STEMI.  Patient was admitted with episodes of chest discomfort and shortness of breath.  Patient was admitted for further assessment and management.  Cardiology team was consulted to assist with patient's management.  Cardiac enzymes were cycled.  Troponin was in the 40s, but remained stable.  Chest pain has resolved.  Patient was managed supportively.  Due to significantly worsening renal function, no further cardiac work-up was done.  Nephrotoxins have been held.  Patient be discharged back on today to the care of the primary care provider.  Patient will follow-up with nephrology team and cardiology team.  Further management will depend on the cardiology team, as well as, the renal function.  Consults: cardiology  Significant Diagnostic Studies: Labs: Stable but mildly elevated troponin ([id number])  Discharge Exam: Blood pressure (!) 98/59, pulse 76, temperature 98.4 F (36.9 C), temperature source Oral, resp. rate 16, height 5\' 9"  (1.753 m), weight 77.2 kg, SpO2 96 %.   Disposition: Discharge disposition: 01-Home or Self Care       Discharge Instructions     Diet - low sodium heart healthy    Complete by: As directed    Increase activity slowly   Complete by: As directed       Allergies as of 04/11/2021   No Known Allergies      Medication List     STOP taking these medications    amLODipine 10 MG tablet Commonly known as: NORVASC   celecoxib 100 MG capsule Commonly known as: CeleBREX   Entresto 97-103 MG Generic drug: sacubitril-valsartan   Farxiga 10 MG Tabs tablet Generic drug: dapagliflozin propanediol   hydrochlorothiazide 25 MG tablet Commonly known as: HYDRODIURIL   HYDROcodone-acetaminophen 5-325 MG tablet Commonly known as: NORCO/VICODIN   PARoxetine 10 MG tablet Commonly known as: PAXIL   potassium chloride SA 20 MEQ tablet Commonly known as: KLOR-CON   pravastatin 20 MG tablet Commonly known as: PRAVACHOL   predniSONE 10 MG tablet Commonly known as: DELTASONE   spironolactone 25 MG tablet Commonly known as: ALDACTONE       TAKE these medications    aspirin 81 MG EC tablet Take 1 tablet (81 mg total) by mouth daily. Swallow whole.   atorvastatin 80 MG tablet Commonly known as: LIPITOR Take 1 tablet (80 mg total) by mouth daily.   Brilinta 90 MG Tabs tablet Generic drug: ticagrelor Take 1 tablet (90 mg total) by mouth 2 (two) times daily.   carvedilol 6.25 MG tablet Commonly known as: COREG Take 1 tablet (6.25 mg total) by mouth 2 (two) times daily with a meal. What changed: Another medication with the same name was removed. Continue taking this medication, and follow the directions you see here.   hydrALAZINE 10 MG tablet Commonly known as: APRESOLINE Take 1 tablet (10 mg total) by mouth  every 6 (six) hours as needed (systolic blood pressure >160).   nitroGLYCERIN 0.4 MG SL tablet Commonly known as: NITROSTAT Place 1 tablet (0.4 mg total) under the tongue every 5 (five) minutes as needed for chest pain.        Follow-up Information     [facility] Follow up on 04/20/2021.    Why: @ 11:10 am for hospital follow up appointment with Gwinda Passe. Contact information: [address] [place] [address] [phone]                Signed: Barnetta Chapel 04/11/2021, 12:42 PM

## 2021-04-11 NOTE — Progress Notes (Signed)
Progress Note  Patient Name: Logan Brewer Date of Encounter: 04/11/2021  [facility] Cardiologist: Bryan Lemma, MD   Subjective   [facility] with nursing yesterday. No shortness of breath reported. O2 sats lowest was 91%. Overall has not had shortness of breath while admitted. Discussed options for management with patient and his wife (wife on the phone).  Inpatient Medications    Scheduled Meds:  aspirin EC  81 mg Oral Daily   atorvastatin  80 mg Oral Daily   carvedilol  6.25 mg Oral BID WC   enoxaparin (LOVENOX) injection  40 mg Subcutaneous Q24H   pantoprazole  40 mg Oral Daily   ticagrelor  90 mg Oral BID   Continuous Infusions:  PRN Meds: acetaminophen **OR** acetaminophen, hydrALAZINE, ipratropium-albuterol, melatonin, nitroGLYCERIN, ondansetron **OR** ondansetron (ZOFRAN) IV, polyethylene glycol   Vital Signs    Vitals:   04/10/21 1914 04/11/21 0451 04/11/21 0805 04/11/21 1006  BP: 126/86 140/71 117/65 (!) 98/59  Pulse: 63 72 87 76  Resp: 16 17 16    Temp: 98.4 F (36.9 C) 98.4 F (36.9 C)    TempSrc: Oral Oral    SpO2: 96% 98% 94% 96%  Weight:      Height:       No intake or output data in the 24 hours ending 04/11/21 1027  Last 3 Weights 04/09/2021 03/15/2021 03/15/2021  Weight (lbs) 170 lb 3.2 oz 178 lb 3.2 oz 178 lb 12.7 oz  Weight (kg) 77.202 kg 80.831 kg 81.1 kg      Telemetry    SR with occasional ectopy - Personally Reviewed  ECG    No new since 10/19 - Personally Reviewed  Physical Exam   GEN: Well nourished, well developed in no acute distress NECK: No JVD CARDIAC: regular rhythm, normal S1 and S2, no rubs or gallops. No murmur. VASCULAR: Radial pulses 2+ bilaterally.  RESPIRATORY:  Clear to auscultation without rales, wheezing or rhonchi  ABDOMEN: Soft, non-tender, non-distended MUSCULOSKELETAL:  Moves all 4 limbs independently SKIN: Warm and dry, no edema NEUROLOGIC:  No focal neuro deficits noted. PSYCHIATRIC:  Normal affect     Labs    High Sensitivity Troponin:   Recent Labs  Lab 03/13/21 0655 04/08/21 2151 04/08/21 2359 04/09/21 0457  TROPONINIHS >24,000* 49* 48* 44*     Chemistry Recent Labs  Lab 04/09/21 0457 04/10/21 0159 04/11/21 0758  NA 135 135 134*  K 4.6 4.8 4.3  CL 109 107 107  CO2 19* 23 18*  GLUCOSE 98 101* 108*  BUN 29* 26* 26*  CREATININE 2.36* 2.34* 2.38*  CALCIUM 8.8* 9.1 9.1  MG 1.9  --  1.9  PROT 6.9  --   --   ALBUMIN 3.0*  --  3.2*  AST 16  --   --   ALT 19  --   --   ALKPHOS 71  --   --   BILITOT 0.4  --   --   GFRNONAA 30* 30* 30*  ANIONGAP 7 5 9     Lipids No results for input(s): CHOL, TRIG, HDL, LABVLDL, LDLCALC, CHOLHDL in the last 168 hours.  Hematology Recent Labs  Lab 04/08/21 2151 04/09/21 0457  WBC 7.8 7.4  RBC 4.53 4.20*  HGB 14.8 13.5  HCT 43.6 40.6  MCV 96.2 96.7  MCH 32.7 32.1  MCHC 33.9 33.3  RDW 12.8 12.8  PLT 323 273   Thyroid No results for input(s): TSH, FREET4 in the last 168 hours.  BNP Recent Labs  Lab 04/08/21 2151  BNP 65.7    DDimer  Recent Labs  Lab 04/09/21 0457  DDIMER 0.49     Radiology    ECHOCARDIOGRAM LIMITED  Result Date: 04/10/2021    ECHOCARDIOGRAM LIMITED REPORT   Patient Name:   Logan Brewer Date of Exam: 04/10/2021 Medical Rec #:  [id number]       Height:       69.0 in Accession #:    [id number]      Weight:       170.2 lb Date of Birth:  06-Dec-1955        BSA:          1.929 m Patient Age:    65 years        BP:           139/69 mmHg Patient Gender: M               HR:           75 bpm. Exam Location:  Inpatient Procedure: 2D Echo and Intracardiac Opacification Agent Indications:    SOB  History:        Patient has prior history of Echocardiogram examinations, most                 recent 03/16/2021. Risk Factors:Former Smoker and Hypertension.  Sonographer:    Alvera Novel Referring Phys: [id number] Sharrell Ku BHAGAT IMPRESSIONS  1. Limited study to assess LV function; anteroseptal akinesis and hypokinesis  of the apex; overall mild to moderate LV dysfunction; unchanged compared to 03/16/21.  2. Left ventricular ejection fraction, by estimation, is 40 to 45%. The left ventricle has mild to moderately decreased function. The left ventricle demonstrates regional wall motion abnormalities (see scoring diagram/findings for description). There is  mild left ventricular hypertrophy of the basal-septal segment.  3. Right ventricular systolic function is normal. The right ventricular size is normal.  4. The mitral valve is normal in structure. FINDINGS  Left Ventricle: Left ventricular ejection fraction, by estimation, is 40 to 45%. The left ventricle has mild to moderately decreased function. The left ventricle demonstrates regional wall motion abnormalities. Definity contrast agent was given IV to delineate the left ventricular endocardial borders. The left ventricular internal cavity size was normal in size. There is mild left ventricular hypertrophy of the basal-septal segment. Right Ventricle: The right ventricular size is normal. Right ventricular systolic function is normal. Left Atrium: Left atrial size was normal in size. Right Atrium: Right atrial size was normal in size. Pericardium: There is no evidence of pericardial effusion. Mitral Valve: The mitral valve is normal in structure. Tricuspid Valve: The tricuspid valve is normal in structure. Aorta: The aortic root is normal in size and structure. IAS/Shunts: No atrial level shunt detected by color flow Doppler. Additional Comments: Limited study to assess LV function; anteroseptal akinesis and hypokinesis of the apex; overall mild to moderate LV dysfunction; unchanged compared to 03/16/21. LEFT VENTRICLE PLAX 2D LVIDd:         4.00 cm LVIDs:         3.20 cm LV PW:         1.50 cm LV IVS:        1.50 cm  LV Volumes (MOD) LV vol d, MOD A2C: 86.3 ml LV vol d, MOD A4C: 121.0 ml LV vol s, MOD A2C: 42.6 ml LV vol s, MOD A4C: 57.4 ml LV SV MOD A2C:     43.7 ml LV SV MOD  A4C:  121.0 ml LV SV MOD BP:      56.2 ml Olga Millers MD Electronically signed by Olga Millers MD Signature Date/Time: 04/10/2021/2:14:20 PM    Final     Cardiac Studies   Echo 04/10/21  1. Limited study to assess LV function; anteroseptal akinesis and  hypokinesis of the apex; overall mild to moderate LV dysfunction;  unchanged compared to 03/16/21.   2. Left ventricular ejection fraction, by estimation, is 40 to 45%. The  left ventricle has mild to moderately decreased function. The left  ventricle demonstrates regional wall motion abnormalities (see scoring  diagram/findings for description). There is   mild left ventricular hypertrophy of the basal-septal segment.   3. Right ventricular systolic function is normal. The right ventricular  size is normal.   4. The mitral valve is normal in structure.   Patient Profile     65 y.o. male history of CAD with recent admission for acute STEMI and VT/VF arrest in 02/2021, with cardiogenic shock requiring inotropes. Initial EF 20-25%, improved to 40-45%. Presented with worsening shortness of breath with exertion, found to have acute kidney injury.  Assessment & Plan    Acute kidney unjury: -suspect this may be due to his outpatient regimen. Will hold spironolactone, HCTZ and farxiga. Will hold entresto as well. Using PRN hydralazine in the interim if BP elevates -Cr has largely remained stable despite holding meds and giving IV fludis, peak 2.39 on admission, baseline 1.6-1.7, today 2.38 -we discussed options. As he was able to ambulate without shortness of breath, reasonable to discharge home with close follow up. Patient and his wife are amenable. Will discuss with medicine attending.   Chest pain, shortness of breath -hsTnI not consistent with ACS -limited echo unchanged -CXR clear -ambulated without shortness of breath or significant desaturation -no clear relationship to ticagrelor   Recent anterior STEMI: continue aspirin,  ticagrelor, carvedilol  [facility] will sign off.   Medication Recommendations:   CONTINUE Aspirin 81 mg daily Atorvastatin 80 mg daily Carvedilol 6.25 mg BID Ticagrelor 90 mg BID  HOLD ON DISCHARGE Dapagliflozin Sacubitril-valsartan Spironolactone Potassium supplement  Unclear if taking, but would remove from list on discharge Pravastatin, celebrex, amlodipine (reportedly previously d/c), HCTZ (reported previously D/C)  Other recommendations (labs, testing, etc):  BMET Follow up as an outpatient:  We will arrange for close follow up in clinic to monitor blood pressure and renal function. Will add back GDMT as blood pressure and renal function allow   For questions or updates, please contact [facility] Please consult [email or web address] for contact info under     Signed, Jodelle Red, MD  04/11/2021, 10:27 AM

## 2021-04-13 ENCOUNTER — Telehealth: Payer: Self-pay

## 2021-04-13 NOTE — Telephone Encounter (Signed)
Transition Care Management Follow-up Telephone Call   Date of discharge and from where:Logan Brewer on 04/11/2021 How have you been since you were released from the hospital? ding well   Any questions or concerns? Pt stated he is doing well but cannot sleep at night, if possible PCP can prescribe something  Items Reviewed: Did the pt receive and understand the discharge instructions provided? has the instructions and have no questions.  Medications obtained and verified? He said that he have the medication list  and the hospital staff reviewed them in detail prior to discharge. He said that he has all of the medications and  have no questions.  Any new allergies since your discharge? None reported  Do you have support at home? Yes, son and wife Other (ie: DME, Home Health, etc)     HHS   Functional Questionnaire: (I = Independent and D = Dependent) ADL's:  Independent.        Follow up appointments reviewed:   PCP Hospital f/u appt confirmed? NP Logan Brewer on 04/20/2021 Specialist Hospital f/u appt confirmed? scheduled at this time  Are transportation arrangements needed? have transportation   If their condition worsens, is the pt aware to call  their PCP or go to the ED? Yes.Made pt aware if condition worsen or start experiencing rapid weight gain, chest pain, diff breathing, SOB, high fevers, or bleading to refer imediately to ED for further evaluation.  Was the patient provided with contact information for the PCP's office or ED? He has the phone number  Was the pt encouraged to call back with questions or concerns?yes

## 2021-04-15 ENCOUNTER — Telehealth (HOSPITAL_COMMUNITY): Payer: Self-pay | Admitting: Vascular Surgery

## 2021-04-20 ENCOUNTER — Ambulatory Visit (INDEPENDENT_AMBULATORY_CARE_PROVIDER_SITE_OTHER): Payer: Self-pay | Admitting: Primary Care

## 2021-04-20 ENCOUNTER — Other Ambulatory Visit: Payer: Self-pay

## 2021-04-20 DIAGNOSIS — Z760 Encounter for issue of repeat prescription: Secondary | ICD-10-CM

## 2021-04-20 DIAGNOSIS — I2109 ST elevation (STEMI) myocardial infarction involving other coronary artery of anterior wall: Secondary | ICD-10-CM

## 2021-04-20 DIAGNOSIS — Z09 Encounter for follow-up examination after completed treatment for conditions other than malignant neoplasm: Secondary | ICD-10-CM

## 2021-04-20 MED ORDER — CARVEDILOL 6.25 MG PO TABS
6.2500 mg | ORAL_TABLET | Freq: Two times a day (BID) | ORAL | 1 refills | Status: DC
  Filled 2021-04-20: qty 60, 30d supply, fill #0

## 2021-04-20 MED ORDER — ATORVASTATIN CALCIUM 80 MG PO TABS
80.0000 mg | ORAL_TABLET | Freq: Every day | ORAL | 1 refills | Status: DC
  Filled 2021-04-20: qty 30, 30d supply, fill #0
  Filled 2021-05-26: qty 30, 30d supply, fill #1

## 2021-04-20 MED ORDER — TICAGRELOR 90 MG PO TABS
90.0000 mg | ORAL_TABLET | Freq: Two times a day (BID) | ORAL | 0 refills | Status: DC
  Filled 2021-04-20: qty 60, 30d supply, fill #0

## 2021-04-20 NOTE — Progress Notes (Signed)
[facility] Telephone Note  I connected with Logan Brewer, on 04/20/2021 at 11:21 AM  by telephone and verified that I am speaking with the correct person using two identifiers.   Consent: I discussed the limitations, risks, security and privacy concerns of performing an evaluation and management service by telephone and the availability of in person appointments. I also discussed with the patient that there may be a patient responsible charge related to this service. The patient expressed understanding and agreed to proceed.   Location of Patient: Set designer of Provider: [facility]   Persons participating in Telemedicine visit: Rick Duff,  NP Cristela Felt , CMA  History of Present Illness: Mr.Logan Brewer is a 65 year old male who presented to the clinic for office visit.  Visit changed to tele he is not vaccinated for COVID and admitted to being around someone that is COVID-positive within the last 2 days.  His only symptom is cold and cough.  Recommended him for COVID testing. Requesting medication refills.   Past Medical History:  Diagnosis Date   Hydrocele in adult    Hypertension    Inguinal hernia    Ischemic cardiomyopathy    Smoker 03/16/2021   smokes 1/2 ppd PTA per hx   STEMI involving left anterior descending coronary artery (HCC) 03/12/2021   No Known Allergies  Current Outpatient Medications on File Prior to Visit  Medication Sig Dispense Refill   aspirin EC 81 MG EC tablet Take 1 tablet (81 mg total) by mouth daily. Swallow whole. 30 tablet 0   atorvastatin (LIPITOR) 80 MG tablet Take 1 tablet (80 mg total) by mouth daily. 30 tablet 0   carvedilol (COREG) 6.25 MG tablet Take 1 tablet (6.25 mg total) by mouth 2 (two) times daily with a meal. 60 tablet 0   hydrALAZINE (APRESOLINE) 10 MG tablet Take 1 tablet (10 mg total) by mouth every 6 (six) hours as needed  (systolic blood pressure >160). 120 tablet 0   nitroGLYCERIN (NITROSTAT) 0.4 MG SL tablet Place 1 tablet (0.4 mg total) under the tongue every 5 (five) minutes as needed for chest pain. 90 tablet 0   ticagrelor (BRILINTA) 90 MG TABS tablet Take 1 tablet (90 mg total) by mouth 2 (two) times daily. 60 tablet 0   No current facility-administered medications on file prior to visit.    Observations/Objective: There were no vitals taken for this visit.   Assessment and Plan: Logan Brewer was seen today for hospitalization follow-up and medication refill.  Diagnoses and all orders for this visit:  Acute ST elevation myocardial infarction (STEMI) of anterior wall [facility]) -     Ambulatory referral to Cardiology  Hospital discharge follow-up Follow-Ups: Follow up with [facility] on 04/20/2021; @ 11:10 am for hospital follow up appointment with Gwinda Passe. Kept appt referred to cardiology  Other orders/Medication refill -     atorvastatin (LIPITOR) 80 MG tablet; Take 1 tablet (80 mg total) by mouth daily. -     ticagrelor (BRILINTA) 90 MG TABS tablet; Take 1 tablet (90 mg total) by mouth 2 (two) times daily. -     carvedilol (COREG) 6.25 MG tablet; Take 1 tablet (6.25 mg total) by mouth 2 (two) times daily with a meal.   Follow Up Instructions:    I discussed the assessment and treatment plan with the patient. The patient was provided an opportunity to ask questions and all were  answered. The patient agreed with the plan and demonstrated an understanding of the instructions.   The patient was advised to call back or seek an in-person evaluation if the symptoms worsen or if the condition fails to improve as anticipated.     I provided 15 minutes total of non-face-to-face time during this encounter including median intraservice time, reviewing previous notes, investigations, ordering medications, medical decision making, coordinating care and patient  verbalized understanding at the end of the visit.    This note has been created with Education officer, environmental. Any transcriptional errors are unintentional.   Grayce Sessions, NP 04/20/2021, 11:21 AM

## 2021-04-20 NOTE — Progress Notes (Signed)
I connected with  Logan Brewer on 04/20/21 by a video enabled telemedicine application and verified that I am speaking with the correct person using two identifiers.   I discussed the limitations of evaluation and management by telemedicine. The patient expressed understanding and agreed to proceed.

## 2021-04-21 NOTE — Telephone Encounter (Signed)
Open in error

## 2021-04-22 ENCOUNTER — Other Ambulatory Visit: Payer: Self-pay

## 2021-04-22 NOTE — Progress Notes (Signed)
Cardiology Office Note   Date:  04/24/2021   ID:  Logan Brewer, DOB 01/18/1956, MRN [id number]  PCP:  Grayce Sessions, NP  Cardiologist:   Bryan Lemma, MD   Chief Complaint  Patient presents with   Coronary Artery Disease       History of Present Illness: Logan Brewer is a 65 y.o. male who presents for follow-up of coronary disease.  He was admitted in late September with VT V. fib arrest with an anterior ST elevation MI.  He had treatment of a 99% LAD lesion.  His ejection fraction initially was 20 to 25%.  Echo last month demonstrated an EF of 40 to 45%.  There is anteroseptal akinesis and hypokinesis of the apex.  He did have renal insufficiency.  Creatinine was as high as 2.39 but was 1.7 at the time of discharge.  We saw him 1 when he was readmitted with shortness of breath.  Of note his creatinine was 2.34 when he was discharged on October 21.  At that time because of his worsening renal insufficiency Sherryll Burger was held.  Marcelline Deist was held.  Hydrochlorothiazide was held.  Also stopped your amlodipine paroxetine, Celebrex.  Since he was last in the hospital he is actually done well.  He is doing a little walking.  He is not getting any of the shortness of breath that he was having.  He never really had any chest pressure, neck or arm discomfort.  He really does not remember the events.  He is not smoking cigarettes anymore.  He has no palpitations, presyncope or syncope.  He has no PND or orthopnea.   Past Medical History:  Diagnosis Date   Hydrocele in adult    Hypertension    Inguinal hernia    Ischemic cardiomyopathy    Smoker 03/16/2021   smokes 1/2 ppd PTA per hx   STEMI involving left anterior descending coronary artery (HCC) 03/12/2021    Past Surgical History:  Procedure Laterality Date   APPENDECTOMY     CORONARY STENT INTERVENTION N/A 03/11/2021   Procedure: CORONARY STENT INTERVENTION;  Surgeon: Marykay Lex, MD;  Location: [facility] INVASIVE CV LAB;   Service: Cardiovascular;  Laterality: N/A;   CORONARY/GRAFT ACUTE MI REVASCULARIZATION N/A 03/11/2021   Procedure: Coronary/Graft Acute MI Revascularization;  Surgeon: Marykay Lex, MD;  Location: [facility] INVASIVE CV LAB;  Service: Cardiovascular;  Laterality: N/A;   HYDROCELE EXCISION Bilateral 11/21/2019   Procedure: HYDROCELECTOMY ADULT;  Surgeon: Malen Gauze, MD;  Location: [facility] ORS;  Service: Urology;  Laterality: Bilateral;  [id number]   HYDROCELE EXCISION Right 04/07/2020   Procedure: RIGHT HYDROCELECTOMY;  Surgeon: Malen Gauze, MD;  Location: [facility];  Service: Urology;  Laterality: Right;   INGUINAL HERNIA REPAIR Left 11/21/2019   Procedure: HERNIA REPAIR INGUINAL ADULT;  Surgeon: [name], MD;  Location: [facility];  Service: General;  Laterality: Left;  [id number]   RIGHT/LEFT HEART CATH AND CORONARY ANGIOGRAPHY N/A 03/11/2021   Procedure: RIGHT/LEFT HEART CATH AND CORONARY ANGIOGRAPHY;  Surgeon: Marykay Lex, MD;  Location: [facility] INVASIVE CV LAB;  Service: Cardiovascular;  Laterality: N/A;     Current Outpatient Medications  Medication Sig Dispense Refill   aspirin EC 81 MG EC tablet Take 1 tablet (81 mg total) by mouth daily. Swallow whole. 30 tablet 0   atorvastatin (LIPITOR) 80 MG tablet Take 1 tablet (80 mg total) by mouth daily. 30 tablet 1   carvedilol (COREG) 6.25 MG tablet Take 1 tablet (6.25  mg total) by mouth 2 (two) times daily with a meal. 60 tablet 1   nitroGLYCERIN (NITROSTAT) 0.4 MG SL tablet Place 1 tablet (0.4 mg total) under the tongue every 5 (five) minutes as needed for chest pain. 90 tablet 0   ticagrelor (BRILINTA) 90 MG TABS tablet Take 1 tablet (90 mg total) by mouth 2 (two) times daily. 60 tablet 0   No current facility-administered medications for this visit.    Allergies:   Patient has no known allergies.    ROS:  Please see the history of present illness.   Otherwise, review of systems are positive for none.   All other systems are reviewed  and negative.    PHYSICAL EXAM: VS:  BP 120/76   Pulse 63   Ht 5\' 7"  (1.702 m)   Wt 176 lb (79.8 kg)   SpO2 96%   BMI 27.57 kg/m  , BMI Body mass index is 27.57 kg/m. GENERAL:  Well appearing NECK:  No jugular venous distention, waveform within normal limits, carotid upstroke brisk and symmetric, no bruits, no thyromegaly LUNGS:  Clear to auscultation bilaterally CHEST:  Unremarkable HEART:  PMI not displaced or sustained,S1 and S2 within normal limits, no S3, no S4, no clicks, no rubs, no murmurs ABD:  Flat, positive bowel sounds normal in frequency in pitch, no bruits, no rebound, no guarding, no midline pulsatile mass, no hepatomegaly, no splenomegaly EXT:  2 plus pulses throughout, no edema, no cyanosis no clubbing    EKG:  EKG is ordered today. The ekg ordered today demonstratesn sinus rhythm, rate 63, axis within normal limits, intervals within normal limits, deep T wave inversions in the anterolateral leads unchanged or slightly progressed from previous representing his previous anteroseptal myocardial infarction.  No acute changes.   Recent Labs: 04/08/2021: B Natriuretic Peptide 65.7 04/09/2021: ALT 19; Hemoglobin 13.5; Platelets 273 04/11/2021: Magnesium 1.9 04/23/2021: BUN 12; Creatinine, Ser 1.60; Potassium 4.3; Sodium 140    Lipid Panel    Component Value Date/Time   CHOL 154 03/12/2021 0512   CHOL 204 (H) 11/04/2020 1029   TRIG 70 03/12/2021 0512   HDL 37 (L) 03/12/2021 0512   HDL 52 11/04/2020 1029   CHOLHDL 4.2 03/12/2021 0512   VLDL 14 03/12/2021 0512   LDLCALC 103 (H) 03/12/2021 0512   LDLCALC 131 (H) 11/04/2020 1029      Wt Readings from Last 3 Encounters:  04/23/21 176 lb (79.8 kg)  04/09/21 170 lb 3.2 oz (77.2 kg)  03/15/21 178 lb 3.2 oz (80.8 kg)    CATH:   Diagnostic Dominance: Right Intervention    Other studies Reviewed: Additional studies/ records that were reviewed today include: Hospital records. Review of the above records  demonstrates:  Please see elsewhere in the note.     ASSESSMENT AND PLAN:  Dyspnea on exertion/chest tightness  : His shortness of breath has improved.  No change in therapy.   CAD/recent anterior STEMI with VF/VT arrest : He will continue with aggressive risk reduction.   Ischemic cardiomyopathy: He has not tolerated med titration because of his creatinine.  His heart rate will not allow beta-blocker up titration.  No change in therapy at this point.   Acute on chronic kidney disease stage III: I will check a creatinine today and refer him to nephrology.  Again medicine such as [place] and Entresto and hydrochlorothiazide were all discontinued previously.   Current medicines are reviewed at length with the patient today.  The patient does not have  concerns regarding medicines.  The following changes have been made:  no change  Labs/ tests ordered today include:   Orders Placed This Encounter  Procedures   Basic metabolic panel   Ambulatory referral to Nephrology   EKG 12-Lead      Disposition:   FU with me in 3 months with APP.      Signed, Minus Breeding, MD  04/24/2021 7:42 AM    [facility]

## 2021-04-23 ENCOUNTER — Encounter: Payer: Self-pay | Admitting: Cardiology

## 2021-04-23 ENCOUNTER — Other Ambulatory Visit: Payer: Self-pay

## 2021-04-23 ENCOUNTER — Ambulatory Visit (INDEPENDENT_AMBULATORY_CARE_PROVIDER_SITE_OTHER): Payer: Self-pay | Admitting: Cardiology

## 2021-04-23 VITALS — BP 120/76 | HR 63 | Ht 67.0 in | Wt 176.0 lb

## 2021-04-23 DIAGNOSIS — N1832 Chronic kidney disease, stage 3b: Secondary | ICD-10-CM

## 2021-04-23 DIAGNOSIS — R0602 Shortness of breath: Secondary | ICD-10-CM

## 2021-04-23 DIAGNOSIS — I2510 Atherosclerotic heart disease of native coronary artery without angina pectoris: Secondary | ICD-10-CM

## 2021-04-23 DIAGNOSIS — E782 Mixed hyperlipidemia: Secondary | ICD-10-CM

## 2021-04-23 LAB — BASIC METABOLIC PANEL
BUN/Creatinine Ratio: 8 — ABNORMAL LOW (ref 10–24)
BUN: 12 mg/dL (ref 8–27)
CO2: 22 mmol/L (ref 20–29)
Calcium: 9.1 mg/dL (ref 8.6–10.2)
Chloride: 106 mmol/L (ref 96–106)
Creatinine, Ser: 1.6 mg/dL — ABNORMAL HIGH (ref 0.76–1.27)
Glucose: 95 mg/dL (ref 70–99)
Potassium: 4.3 mmol/L (ref 3.5–5.2)
Sodium: 140 mmol/L (ref 134–144)
eGFR: 48 mL/min/{1.73_m2} — ABNORMAL LOW (ref >59)

## 2021-04-23 NOTE — Patient Instructions (Signed)
Medication Instructions:  The current medical regimen is effective;  continue present plan and medications.  *If you need a refill on your cardiac medications before your next appointment, please call your pharmacy*   Lab Work: BMET today   If you have labs (blood work) drawn today and your tests are completely normal, you will receive your results only by: MyChart Message (if you have MyChart) OR A paper copy in the mail If you have any lab test that is abnormal or we need to change your treatment, we will call you to review the results.   Follow-Up: At [facility], you and your health needs are our priority.  As part of our continuing mission to provide you with exceptional heart care, we have created designated Provider Care Teams.  These Care Teams include your primary Cardiologist (physician) and Advanced Practice Providers (APPs -  Physician Assistants and Nurse Practitioners) who all work together to provide you with the care you need, when you need it.  We recommend signing up for the patient portal called "MyChart".  Sign up information is provided on this After Visit Summary.  MyChart is used to connect with patients for Virtual Visits (Telemedicine).  Patients are able to view lab/test results, encounter notes, upcoming appointments, etc.  Non-urgent messages can be sent to your provider as well.   To learn more about what you can do with MyChart, go to [email or web address].    Your next appointment:   3 month(s) (come back fasting so we can check your cholesterol)  The format for your next appointment:   In Person  Provider:   You will see one of the following Advanced Practice Providers on your designated Care Team:   Theodore Demark, PA-C Juanda Crumble, PA-C Joni Reining, DNP, ANP    Other Instructions Referral to Nephrology- they will contact you to set up an appoinment.

## 2021-04-24 ENCOUNTER — Encounter: Payer: Self-pay | Admitting: Cardiology

## 2021-04-24 ENCOUNTER — Encounter: Payer: Self-pay | Admitting: *Deleted

## 2021-05-01 ENCOUNTER — Telehealth (HOSPITAL_COMMUNITY): Payer: Self-pay

## 2021-05-01 NOTE — Telephone Encounter (Signed)
Called to confirm/remind patient of their appointment at the [facility] on 05/04/21.   Patient reminded to bring all medications and/or complete list.  Confirmed patient has transportation. Gave directions, instructed to utilize valet parking.  Confirmed appointment prior to ending call.   

## 2021-05-04 ENCOUNTER — Encounter (HOSPITAL_COMMUNITY): Payer: Self-pay

## 2021-05-04 NOTE — Progress Notes (Incomplete)
ADVANCED HF CLINIC CONSULT NOTE   Primary Care: Grayce Sessions, NP  HF Cardiologist: Dr. Gala Romney  HPI: Logan Brewer is a 65 y.o. male who presents for follow-up of coronary disease.  He was admitted in late September with VT V. fib arrest with an anterior ST elevation MI.  He had treatment of a 99% LAD lesion.  His ejection fraction initially was 20 to 25%.  Echo last month demonstrated an EF of 40 to 45%.  There is anteroseptal akinesis and hypokinesis of the apex.  He did have renal insufficiency.  Creatinine was as high as 2.39 but was 1.7 at the time of discharge.  We saw him 1 when he was readmitted with shortness of breath.  Of note his creatinine was 2.34 when he was discharged on October 21.  At that time because of his worsening renal insufficiency Sherryll Burger was held.  Marcelline Deist was held.  Hydrochlorothiazide was held.  Also stopped your amlodipine paroxetine, Celebrex.   Since he was last in the hospital he is actually done well.  He is doing a little walking.  He is not getting any of the shortness of breath that he was having.  He never really had any chest pressure, neck or arm discomfort.  He really does not remember the events.  He is not smoking cigarettes anymore.  He has no palpitations, presyncope or syncope.  He has no PND or orthopnea.      Review of Systems: [y] = yes, [ ]  = no   General: Weight gain [ ] ; Weight loss [ ] ; Anorexia [ ] ; Fatigue [ ] ; Fever [ ] ; Chills [ ] ; Weakness [ ]   Cardiac: Chest pain/pressure [ ] ; Resting SOB [ ] ; Exertional SOB [ ] ; Orthopnea [ ] ; Pedal Edema [ ] ; Palpitations [ ] ; Syncope [ ] ; Presyncope [ ] ; Paroxysmal nocturnal dyspnea[ ]   Pulmonary: Cough [ ] ; Wheezing[ ] ; Hemoptysis[ ] ; Sputum [ ] ; Snoring [ ]   GI: Vomiting[ ] ; Dysphagia[ ] ; Melena[ ] ; Hematochezia [ ] ; Heartburn[ ] ; Abdominal pain [ ] ; Constipation [ ] ; Diarrhea [ ] ; BRBPR [ ]   GU: Hematuria[ ] ; Dysuria [ ] ; Nocturia[ ]   Vascular: Pain in legs with walking [ ] ; Pain  in feet with lying flat [ ] ; Non-healing sores [ ] ; Stroke [ ] ; TIA [ ] ; Slurred speech [ ] ;  Neuro: Headaches[ ] ; Vertigo[ ] ; Seizures[ ] ; Paresthesias[ ] ;Blurred vision [ ] ; Diplopia [ ] ; Vision changes [ ]   Ortho/Skin: Arthritis [ ] ; Joint pain [ ] ; Muscle pain [ ] ; Joint swelling [ ] ; Back Pain [ ] ; Rash [ ]   Psych: Depression[ ] ; Anxiety[ ]   Heme: Bleeding problems [ ] ; Clotting disorders [ ] ; Anemia [ ]   Endocrine: Diabetes [ ] ; Thyroid dysfunction[ ]    Past Medical History:  Diagnosis Date   Hydrocele in adult    Hypertension    Inguinal hernia    Ischemic cardiomyopathy    Smoker 03/16/2021   smokes 1/2 ppd PTA per hx   STEMI involving left anterior descending coronary artery (HCC) 03/12/2021    Current Outpatient Medications  Medication Sig Dispense Refill   aspirin EC 81 MG EC tablet Take 1 tablet (81 mg total) by mouth daily. Swallow whole. 30 tablet 0   atorvastatin (LIPITOR) 80 MG tablet Take 1 tablet (80 mg total) by mouth daily. 30 tablet 1   carvedilol (COREG) 6.25 MG tablet Take 1 tablet (6.25 mg total) by mouth 2 (two) times daily with a meal. 60  tablet 1   nitroGLYCERIN (NITROSTAT) 0.4 MG SL tablet Place 1 tablet (0.4 mg total) under the tongue every 5 (five) minutes as needed for chest pain. 90 tablet 0   ticagrelor (BRILINTA) 90 MG TABS tablet Take 1 tablet (90 mg total) by mouth 2 (two) times daily. 60 tablet 0   No current facility-administered medications for this visit.    No Known Allergies    Social History   Socioeconomic History   Marital status: Single    Spouse name: Not on file   Number of children: Not on file   Years of education: Not on file   Highest education level: Not on file  Occupational History   Not on file  Tobacco Use   Smoking status: Former    Packs/day: 0.50    Types: Cigarettes   Smokeless tobacco: Never  Vaping Use   Vaping Use: Never used  Substance and Sexual Activity   Alcohol use: No   Drug use: Yes    Types:  Marijuana    Comment: occasionally    Sexual activity: Not on file  Other Topics Concern   Not on file  Social History Narrative   ** Merged History Encounter **       Social Determinants of Health   Financial Resource Strain: Medium Risk   Difficulty of Paying Living Expenses: Somewhat hard  Food Insecurity: Food Insecurity Present   Worried About Charity fundraiser in the Last Year: Never true   Ran Out of Food in the Last Year: Sometimes true  Transportation Needs: No Transportation Needs   Lack of Transportation (Medical): No   Lack of Transportation (Non-Medical): No  Physical Activity: Not on file  Stress: Not on file  Social Connections: Not on file  Intimate Partner Violence: Not on file      Family History  Problem Relation Age of Onset   Heart disease Neg Hx     There were no vitals filed for this visit.  PHYSICAL EXAM: General:  Well appearing. No respiratory difficulty HEENT: normal Neck: supple. no JVD. Carotids 2+ bilat; no bruits. No lymphadenopathy or thryomegaly appreciated. Cor: PMI nondisplaced. Regular rate & rhythm. No rubs, gallops or murmurs. Lungs: clear Abdomen: soft, nontender, nondistended. No hepatosplenomegaly. No bruits or masses. Good bowel sounds. Extremities: no cyanosis, clubbing, rash, edema Neuro: alert & oriented x 3, cranial nerves grossly intact. moves all 4 extremities w/o difficulty. Affect pleasant.  ECG:   ASSESSMENT & PLAN:  1. Cardiogenic shock (acute systolic HF) - Secondary to anterior ST elevation MI/OOH arrest - EF 20-25% on echo with akinesis of anteroseptal wall and apex - Bedside echo EF 35-40% 03/14/21.  - Repeat Limited ECHO 9/26 w/ improved EF, 40-45%   - Co-ox stable off milrinone. 69%.   - Volume status stable. CVP 2-3  - Continue entresto 97-103 bid - Continue spiro 25 mg daily - Stop Digoxin w/ improved EF  - Continue carvedilol 6.25 bid - Continue dapaglifozin 10 mg daily.  - Renal function stable.     2. CAD/Anterior ST elevation MI: - s/p PCI/DES to LAD as above - DAPT with aspirin and ticagrelor - No chest pain.  - Continue statin, ? blocker and MRA  - CR seeing   3. VT/VF arrest: - D/t #2 - no further VT/VF  - off amio  - EF improving, now 40-45%. No indication for LifeVest    4. Ischemic bowel - confirmed on CT. No perf - clinically improved w/  zosyn    5. AKI: -Scr peaked at 2.7-->1.5 -->pending  -D/t ATN/shock.  -Baseline not certain.     6. Elevated LFTs: -Due to shock liver -Trending down   6. Acute respiratory failure: -Secondary to AMI and shock -Extubated 9/22 - stable on room air.    7. Hypokalemia: - BMP in process  - c/w KCl  - Continue spiro 25    8. Tobacco use: - understands need for cessation   9. Leukocytosis/fever: - likely due to MI and ischemic bowel - WBC trending down.  - completed 5 day course of zosyn    10. Hemoptysis -Likely secondary to CPR +trauma from ETT -CT chest noted groundglass opacities in both upper lobes could reflect pulmonary hemorrhage -Continues to have scant hemoptysis but improving, Hgb stable at 12  -TRH d/w pulmonary, recommended supportive care     Stable for d/c from cardiac standpoint.    Cardiac Meds for D/c.  ASA 81 mg daily  Brilinta 90 mg bid Atorvastatin 80 mg daily  Coreg 6.25 mg bid Farxiga 10 mg daily  Entresto 97-103 mg bid  Spironolactone 25 mg daily  Potassium 40 meq three times a day    Will get all HF meds through HF fund, except Entresto and Brilinta (will need patient assistance. We will assist w/ paper work). Will get first 30 day free supply through [facility].    We will arrange post hospital f/u in [facility] and will place appt info in AVS . Plan to check BMET next week.

## 2021-05-12 ENCOUNTER — Telehealth (HOSPITAL_COMMUNITY): Payer: Self-pay

## 2021-05-12 ENCOUNTER — Inpatient Hospital Stay (INDEPENDENT_AMBULATORY_CARE_PROVIDER_SITE_OTHER): Payer: Self-pay | Admitting: Primary Care

## 2021-05-12 NOTE — Telephone Encounter (Signed)
Called and spoke with pt SGO Logan Brewer who stated pt would be interested in our VCR program after the holidays.

## 2021-05-20 ENCOUNTER — Ambulatory Visit (INDEPENDENT_AMBULATORY_CARE_PROVIDER_SITE_OTHER): Payer: Self-pay | Admitting: *Deleted

## 2021-05-20 ENCOUNTER — Encounter (INDEPENDENT_AMBULATORY_CARE_PROVIDER_SITE_OTHER): Payer: Self-pay | Admitting: Primary Care

## 2021-05-20 ENCOUNTER — Other Ambulatory Visit: Payer: Self-pay

## 2021-05-20 ENCOUNTER — Ambulatory Visit (INDEPENDENT_AMBULATORY_CARE_PROVIDER_SITE_OTHER): Payer: Self-pay | Admitting: Primary Care

## 2021-05-20 VITALS — BP 186/81 | HR 74 | Temp 97.9°F | Ht 68.0 in | Wt 187.6 lb

## 2021-05-20 DIAGNOSIS — N2889 Other specified disorders of kidney and ureter: Secondary | ICD-10-CM

## 2021-05-20 DIAGNOSIS — I151 Hypertension secondary to other renal disorders: Secondary | ICD-10-CM

## 2021-05-20 DIAGNOSIS — R600 Localized edema: Secondary | ICD-10-CM

## 2021-05-20 MED ORDER — CARVEDILOL 6.25 MG PO TABS
6.2500 mg | ORAL_TABLET | Freq: Two times a day (BID) | ORAL | 1 refills | Status: DC
  Filled 2021-05-20 – 2021-07-06 (×3): qty 60, 30d supply, fill #0
  Filled 2021-08-05: qty 60, 30d supply, fill #1
  Filled 2021-09-14: qty 60, 30d supply, fill #2

## 2021-05-20 MED ORDER — TICAGRELOR 90 MG PO TABS
90.0000 mg | ORAL_TABLET | Freq: Two times a day (BID) | ORAL | 1 refills | Status: DC
  Filled 2021-05-20 – 2021-07-06 (×3): qty 60, 30d supply, fill #0
  Filled 2021-08-05: qty 60, 30d supply, fill #1
  Filled 2021-09-14: qty 60, 30d supply, fill #2
  Filled 2021-10-16 – 2021-11-23 (×2): qty 60, 30d supply, fill #0

## 2021-05-20 MED ORDER — FUROSEMIDE 20 MG PO TABS
20.0000 mg | ORAL_TABLET | Freq: Every day | ORAL | 0 refills | Status: DC
Start: 2021-05-20 — End: 2021-06-05
  Filled 2021-05-20: qty 14, 14d supply, fill #0

## 2021-05-20 NOTE — Patient Instructions (Signed)
Edema ?Edema is when you have too much fluid in your body or under your skin. Edema may make your legs, feet, and ankles swell. Swelling often happens in looser tissues, such as around your eyes. This is a common condition. It gets more common as you get older. ?There are many possible causes of edema. These include: ?Eating too much salt (sodium). ?Being on your feet or sitting for a long time. ?Certain medical conditions, such as: ?Pregnancy. ?Heart failure. ?Liver disease. ?Kidney disease. ?Cancer. ?Hot weather may make edema worse. Edema is usually painless. Your skin may look swollen or shiny. ?Follow these instructions at home: ?Medicines ?Take over-the-counter and prescription medicines only as told by your doctor. ?Your doctor may prescribe a medicine to help your body get rid of extra water (diuretic). Take this medicine if you are told to take it. ?Eating and drinking ?Eat a low-salt (low-sodium) diet as told by your doctor. Sometimes, eating less salt may reduce swelling. ?Depending on the cause of your swelling, you may need to limit how much fluid you drink (fluid restriction). ?General instructions ?Raise the injured area above the level of your heart while you are sitting or lying down. ?Do not sit still or stand for a long time. ?Do not wear tight clothes. Do not wear garters on your upper legs. ?Exercise your legs. This can help the swelling go down. ?Wear compression stockings as told by your doctor. It is important that these are the right size. These should be prescribed by your doctor to prevent possible injuries. ?If elastic bandages or wraps are recommended, use them as told by your doctor. ?Contact a doctor if: ?Treatment is not working. ?You have heart, liver, or kidney disease and have symptoms of edema. ?You have sudden and unexplained weight gain. ?Get help right away if: ?You have shortness of breath or chest pain. ?You cannot breathe when you lie down. ?You have pain, redness, or warmth  in the swollen areas. ?You have heart, liver, or kidney disease and get edema all of a sudden. ?You have a fever and your symptoms get worse all of a sudden. ?These symptoms may be an emergency. Get help right away. Call 911. ?Do not wait to see if the symptoms will go away. ?Do not drive yourself to the hospital. ?Summary ?Edema is when you have too much fluid in your body or under your skin. ?Edema may make your legs, feet, and ankles swell. Swelling often happens in looser tissues, such as around your eyes. ?Raise the injured area above the level of your heart while you are sitting or lying down. ?Follow your doctor's instructions about diet and how much fluid you can drink. ?This information is not intended to replace advice given to you by your health care provider. Make sure you discuss any questions you have with your health care provider. ?Document Revised: 02/09/2021 Document Reviewed: 02/09/2021 ?Elsevier Patient Education ? 2022 Else[employer] Inc[employer] ? ?

## 2021-05-20 NOTE — Telephone Encounter (Signed)
Reason for Disposition  [1] MILD swelling of both ankles (i.e., pedal edema) AND [2] new-onset or worsening  Answer Assessment - Initial Assessment Questions 1. ONSET: "When did the swelling start?" (e.g., minutes, hours, days)     Vikki Ports wife calling in.    He has swollen right foot.   Both are swollen.   Right is worse.  Yesterday I noticed it.  Never happened before. 2. LOCATION: "What part of the leg is swollen?"  "Are both legs swollen or just one leg?"     Right foot is swollen up to my ankle.   Left foot a little swollen up to my ankle 3. SEVERITY: "How bad is the swelling?" (e.g., localized; mild, moderate, severe)  - Localized - small area of swelling localized to one leg  - MILD pedal edema - swelling limited to foot and ankle, pitting edema < 1/4 inch (6 mm) deep, rest and elevation eliminate most or all swelling  - MODERATE edema - swelling of lower leg to knee, pitting edema > 1/4 inch (6 mm) deep, rest and elevation only partially reduce swelling  - SEVERE edema - swelling extends above knee, facial or hand swelling present      Both ankles hurt.    4. REDNESS: "Does the swelling look red or infected?"     No infection or redness 5. PAIN: "Is the swelling painful to touch?" If Yes, ask: "How painful is it?"   (Scale 1-10; mild, moderate or severe)     Both ankles hurt.   Sometimes my feet hurt. 6. FEVER: "Do you have a fever?" If Yes, ask: "What is it, how was it measured, and when did it start?"      Not asked 7. CAUSE: "What do you think is causing the leg swelling?"     I don't know.   I slept on the couch last night and the swelling went down.   8. MEDICAL HISTORY: "Do you have a history of heart failure, kidney disease, liver failure, or cancer?"     I had a heart attack a month ago.  Sept 27, 2022 9. RECURRENT SYMPTOM: "Have you had leg swelling before?" If Yes, ask: "When was the last time?" "What happened that time?"     No problems with legs swelling before heart  attack 10. OTHER SYMPTOMS: "Do you have any other symptoms?" (e.g., chest pain, difficulty breathing)       Shortness of breath sometimes.  They said in the hospital I was having trouble with my kidney or something like that. 11. PREGNANCY: "Is there any chance you are pregnant?" "When was your last menstrual period?"       N/A  Protocols used: Leg Swelling and Edema-A-AH

## 2021-05-20 NOTE — Progress Notes (Signed)
Assessment and Plan:    HPI Logan Brewer is a 65 y.o.male presents with his wife Logan Brewer and gave permission to speak on his behalf and at that appointment.  He presents today for bilateral lower extreme edema 3+. Blood pressure systolic is elevated diastolic within normal limits followed by cardiology.  Stage III chronic kidney disease cardiologist referred to nephrology.  Consulted with nephrology regarding bilateral lower extremity edema recommended 20 mg of Lasix daily for 14 days have patient to return reevaluate and labs.  Nephrologist and PCP will work together until appointment with nephrologist. Past Medical History:  Diagnosis Date   Hydrocele in adult    Hypertension    Inguinal hernia    Ischemic cardiomyopathy    Smoker 03/16/2021   smokes 1/2 ppd PTA per hx   STEMI involving left anterior descending coronary artery (HCC) 03/12/2021     No Known Allergies    Current Outpatient Medications on File Prior to Visit  Medication Sig Dispense Refill   aspirin EC 81 MG EC tablet Take 1 tablet (81 mg total) by mouth daily. Swallow whole. 30 tablet 0   atorvastatin (LIPITOR) 80 MG tablet Take 1 tablet (80 mg total) by mouth daily. 30 tablet 1   nitroGLYCERIN (NITROSTAT) 0.4 MG SL tablet Place 1 tablet (0.4 mg total) under the tongue every 5 (five) minutes as needed for chest pain. 90 tablet 0   No current facility-administered medications on file prior to visit.    ROS: all negative except above.   Physical Exam: Filed Weights   05/20/21 1601  Weight: 187 lb 9.6 oz (85.1 kg)   BP (!) 186/81 (BP Location: Right Arm, Patient Position: Sitting, Cuff Size: Normal)   Pulse 74   Temp 97.9 F (36.6 C) (Temporal)   Ht 5\' 8"  (1.727 m)   Wt 187 lb 9.6 oz (85.1 kg)   SpO2 98%   BMI 28.52 kg/m  General Appearance: Well nourished, in no apparent distress. Eyes: PERRLA, EOMs, conjunctiva no swelling or erythema Sinuses: No Frontal/maxillary tenderness ENT/Mouth: Ext aud  canals clear, TMs without erythema, bulging. No erythema, swelling, or exudate on post pharynx.  Tonsils not swollen or erythematous. Hearing normal.  Neck: Supple, thyroid normal.  Respiratory: Respiratory effort normal, BS equal bilaterally without rales, rhonchi, wheezing or stridor.  Cardio: RRR with no MRGs. Brisk peripheral pulses without edema.  Abdomen: Soft, + BS.  Non tender, no guarding, rebound, hernias, masses. Lymphatics: Non tender without lymphadenopathy.  Musculoskeletal: Full ROM, 5/5 strength, normal gait.  Skin: Warm, dry without rashes, lesions, ecchymosis.  Neuro: Cranial nerves intact. Normal muscle tone, no cerebellar symptoms. Sensation intact.  Psych: Awake and oriented X 3, normal affect, Insight and Judgment appropriate.   Vraj was seen today for foot swelling.  Diagnoses and all orders for this visit:  Localized edema See HPI recommendation made by nephrologist that we will be following up with him first available appointment. -     furosemide (LASIX) 20 MG tablet; Take 1 tablet (20 mg total) by mouth daily.  Hypertension secondary to other renal disorders [facility] medication prescribed by cardiologist -     carvedilol (COREG) 6.25 MG tablet; Take 1 tablet (6.25 mg total) by mouth 2 (two) times daily with a meal. -     furosemide (LASIX) 20 MG tablet; Take 1 tablet (20 mg total) by mouth daily. -     ticagrelor (BRILINTA) 90 MG TABS tablet;  Take 1 tablet (90 mg total) by mouth 2 (two) times daily.    Kerin Perna, NP 8:42 AM

## 2021-05-20 NOTE — Telephone Encounter (Signed)
Pt's wife called in but I spoke with pt during triage.     He is c/o having swollen feet.   See triage notes.  I made him an appt for today with Gwinda Passe, NP at 3:50 in office visit at [facility].

## 2021-05-21 ENCOUNTER — Other Ambulatory Visit: Payer: Self-pay

## 2021-05-26 ENCOUNTER — Other Ambulatory Visit: Payer: Self-pay

## 2021-05-26 ENCOUNTER — Other Ambulatory Visit (HOSPITAL_BASED_OUTPATIENT_CLINIC_OR_DEPARTMENT_OTHER): Payer: Self-pay

## 2021-05-27 ENCOUNTER — Other Ambulatory Visit: Payer: Self-pay

## 2021-06-05 ENCOUNTER — Other Ambulatory Visit: Payer: Self-pay

## 2021-06-05 ENCOUNTER — Encounter (INDEPENDENT_AMBULATORY_CARE_PROVIDER_SITE_OTHER): Payer: Self-pay | Admitting: Primary Care

## 2021-06-05 ENCOUNTER — Ambulatory Visit (INDEPENDENT_AMBULATORY_CARE_PROVIDER_SITE_OTHER): Payer: Self-pay | Admitting: Primary Care

## 2021-06-05 VITALS — BP 165/83 | HR 72 | Temp 97.5°F | Ht 68.0 in | Wt 186.8 lb

## 2021-06-05 DIAGNOSIS — A64 Unspecified sexually transmitted disease: Secondary | ICD-10-CM

## 2021-06-05 DIAGNOSIS — E782 Mixed hyperlipidemia: Secondary | ICD-10-CM

## 2021-06-05 DIAGNOSIS — I10 Essential (primary) hypertension: Secondary | ICD-10-CM

## 2021-06-05 DIAGNOSIS — R7303 Prediabetes: Secondary | ICD-10-CM

## 2021-06-05 LAB — POCT GLYCOSYLATED HEMOGLOBIN (HGB A1C): Hemoglobin A1C: 6.3 % — AB (ref 4.0–5.6)

## 2021-06-05 MED ORDER — HYDROCHLOROTHIAZIDE 12.5 MG PO TABS
12.5000 mg | ORAL_TABLET | Freq: Every day | ORAL | 1 refills | Status: DC
  Filled 2021-06-05 – 2021-06-29 (×2): qty 30, 30d supply, fill #0

## 2021-06-05 MED ORDER — AMLODIPINE BESYLATE 10 MG PO TABS
10.0000 mg | ORAL_TABLET | Freq: Every day | ORAL | 1 refills | Status: DC
  Filled 2021-06-05 – 2021-07-06 (×3): qty 30, 30d supply, fill #0
  Filled 2021-08-05: qty 30, 30d supply, fill #1
  Filled 2021-09-14: qty 30, 30d supply, fill #2

## 2021-06-05 MED ORDER — ATORVASTATIN CALCIUM 20 MG PO TABS
20.0000 mg | ORAL_TABLET | Freq: Every day | ORAL | 3 refills | Status: DC
  Filled 2021-06-05 – 2021-07-06 (×3): qty 30, 30d supply, fill #0
  Filled 2021-08-05: qty 30, 30d supply, fill #1
  Filled 2021-09-14: qty 30, 30d supply, fill #2

## 2021-06-05 NOTE — Progress Notes (Signed)
HPI 65 y.o.male presents for  Past Medical History:  Diagnosis Date   Hydrocele in adult    Hypertension    Inguinal hernia    Ischemic cardiomyopathy    Smoker 03/16/2021   smokes 1/2 ppd PTA per hx   STEMI involving left anterior descending coronary artery (HCC) 03/12/2021     No Known Allergies    Current Outpatient Medications on File Prior to Visit  Medication Sig Dispense Refill   aspirin EC 81 MG EC tablet Take 1 tablet (81 mg total) by mouth daily. Swallow whole. 30 tablet 0   atorvastatin (LIPITOR) 80 MG tablet Take 1 tablet (80 mg total) by mouth daily. 30 tablet 1   carvedilol (COREG) 6.25 MG tablet Take 1 tablet (6.25 mg total) by mouth 2 (two) times daily with a meal. 180 tablet 1   furosemide (LASIX) 20 MG tablet Take 1 tablet (20 mg total) by mouth daily. 14 tablet 0   nitroGLYCERIN (NITROSTAT) 0.4 MG SL tablet Place 1 tablet (0.4 mg total) under the tongue every 5 (five) minutes as needed for chest pain. 90 tablet 0   ticagrelor (BRILINTA) 90 MG TABS tablet Take 1 tablet (90 mg total) by mouth 2 (two) times daily. 180 tablet 1   No current facility-administered medications on file prior to visit.    ROS: all negative except above.   Physical Exam: Filed Weights   06/05/21 0925  Weight: 186 lb 12.8 oz (84.7 kg)   BP (!) 165/83 (BP Location: Right Arm, Patient Position: Sitting, Cuff Size: Normal)    Pulse 72    Temp (!) 97.5 F (36.4 C) (Temporal)    Ht 5\' 8"  (1.727 m)    Wt 186 lb 12.8 oz (84.7 kg)    SpO2 98%    BMI 28.40 kg/m  General Appearance: Well nourished, in no apparent distress. Eyes: PERRLA, EOMs, conjunctiva no swelling or erythema Sinuses: No Frontal/maxillary tenderness ENT/Mouth: Ext aud canals clear, TMs without erythema, bulging. No erythema, swelling, or exudate on post pharynx.  Tonsils not swollen or erythematous. Hearing normal.  Neck: Supple, thyroid normal.  Respiratory: Respiratory effort normal, BS equal bilaterally without  rales, rhonchi, wheezing or stridor.  Cardio: RRR with no MRGs. Brisk peripheral pulses without edema.  Abdomen: Soft, + BS.  Non tender, no guarding, rebound, hernias, masses. Lymphatics: Non tender without lymphadenopathy.  Musculoskeletal: Full ROM, 5/5 strength, normal gait.  Skin: Warm, dry without rashes, lesions, ecchymosis.  Neuro: Cranial nerves intact. Normal muscle tone, no cerebellar symptoms. Sensation intact.  Psych: Awake and oriented X 3, normal affect, Insight and Judgment appropriate.    Doyne was seen today for edema.  Diagnoses and all orders for this visit:  Prediabetes -     HgB A1c  Essential hypertension Counseled on blood pressure goal of less than 130/80, low-sodium, DASH diet, medication compliance, 150 minutes of moderate intensity exercise per week. Discussed medication compliance, adverse effects.  -     amLODipine (NORVASC) 10 MG tablet; Take 1 tablet (10 mg total) by mouth daily. -     hydrochlorothiazide (HYDRODIURIL) 12.5 MG tablet; Take 1 tablet (12.5 mg total) by mouth daily.  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 Foods high in cholesterol or liver, fatty meats,cheese, butter avocados, nuts and seeds,  chocolate and fried foods.  -     atorvastatin (LIPITOR) 20 MG tablet; Take 1 tablet (20 mg total) by mouth daily.  Other orders -     Urinalysis, Routine w reflex microscopic; Standing -     Urinalysis, Routine w reflex microscopic    Grayce Sessions, NP 9:48 AM

## 2021-06-06 LAB — HIV ANTIBODY (ROUTINE TESTING W REFLEX): HIV Screen 4th Generation wRfx: NONREACTIVE

## 2021-06-08 ENCOUNTER — Other Ambulatory Visit: Payer: Self-pay

## 2021-06-10 ENCOUNTER — Other Ambulatory Visit (HOSPITAL_COMMUNITY)
Admission: RE | Admit: 2021-06-10 | Discharge: 2021-06-10 | Disposition: A | Discharge status: Home / Self Care | Payer: Self-pay | Source: Ambulatory Visit | Attending: Primary Care | Admitting: Primary Care

## 2021-06-10 ENCOUNTER — Other Ambulatory Visit (INDEPENDENT_AMBULATORY_CARE_PROVIDER_SITE_OTHER): Payer: Self-pay | Admitting: Primary Care

## 2021-06-10 DIAGNOSIS — Z113 Encounter for screening for infections with a predominantly sexual mode of transmission: Secondary | ICD-10-CM

## 2021-06-16 ENCOUNTER — Telehealth (HOSPITAL_COMMUNITY): Payer: Self-pay

## 2021-06-16 NOTE — Progress Notes (Incomplete)
[facility] CONSULT NOTE   Primary Care: Grayce Sessions, NP HF Cardiologist: Dr. Gala Romney  HPI: Logan Brewer is a 65 y.o. male with history of uncontrolled HTN and heavy, longstanding tobacco use, systolic heart failure, CAD, and VT/VF arrest.   Patient developed CP on evening of 03/11/21. Around 10 pm, he was on way to ED via his wife and became unresponsive. CPR started when EMS arrived about 10 minutes later. Had recurrent VT/VF arrest and required multiple defibrillations. He was intubated. Given amiodarone bolus followed by drip. Recurrent VF/polymorphic VT requiring defibrillation. Lidocaine started. Severely acidotic with pH 6.9.  ECG with sinus tach, ST elevations in V1-V3, aVL, AVR, I, II, III and ST depressions in V4-V6. Coronary angiogram demonstrated 99% ostial to proximal LAD treated with PCI/DES. Successful defibrillation X 2  for polymorphic VT/VF while crossing into LV. LVEDP initially 45 mmHg > improved to 28 mmHg after furosemide. AHF consulted. Milrinone gtt started with marginal Co-Ox, eventually able to wean off. Hospitalization c/b ischemic bowel, no perforation. GDMT titrated. No LifeVest at discharge with improved EF on echo, EF up to 40%. Discharged home, weight 178 lbs.  Readmitted 10/22 with CP and SOB. Cards consulted and repeat echo showed EF 40-45%, unchanged from prior. hsTnI not consistent with ACS. Also found to have AKI, all GDMT held at discharge.   Today he returns for HF follow up. Overall feeling fine. Denies increasing SOB, CP, dizziness, edema, or PND/Orthopnea. Appetite ok. No fever or chills. Weight at home 170 pounds. Taking all medications.    Cardiac Studies:  - Echo 03/12/21 EF 20-25%, akinesis of anteroseptal wall and apex, RV okay, no MR, IVC normal in size  - R/LHC 9/22:  severe single vessel CAD, 95-99% ostial LAD s/p PCI + DES. EF 20-25%. RV normal. No MR, moderate pulm hypertension   - Echo 03/16/21, post PCI:  EF 40% with  demonstrated regional wall motion abnormality, of the left ventricular mid apical anterior wall and apical segment.    Review of Systems: [y] = yes, [ ]  = no   General: Weight gain [ ] ; Weight loss [ ] ; Anorexia [ ] ; Fatigue [ ] ; Fever [ ] ; Chills [ ] ; Weakness [ ]   Cardiac: Chest pain/pressure [ ] ; Resting SOB [ ] ; Exertional SOB [ ] ; Orthopnea [ ] ; Pedal Edema [ ] ; Palpitations [ ] ; Syncope [ ] ; Presyncope [ ] ; Paroxysmal nocturnal dyspnea[ ]   Pulmonary: Cough [ ] ; Wheezing[ ] ; Hemoptysis[ ] ; Sputum [ ] ; Snoring [ ]   GI: Vomiting[ ] ; Dysphagia[ ] ; Melena[ ] ; Hematochezia [ ] ; Heartburn[ ] ; Abdominal pain [ ] ; Constipation [ ] ; Diarrhea [ ] ; BRBPR [ ]   GU: Hematuria[ ] ; Dysuria [ ] ; Nocturia[ ]   Vascular: Pain in legs with walking [ ] ; Pain in feet with lying flat [ ] ; Non-healing sores [ ] ; Stroke [ ] ; TIA [ ] ; Slurred speech [ ] ;  Neuro: Headaches[ ] ; Vertigo[ ] ; Seizures[ ] ; Paresthesias[ ] ;Blurred vision [ ] ; Diplopia [ ] ; Vision changes [ ]   Ortho/Skin: Arthritis [ ] ; Joint pain [ ] ; Muscle pain [ ] ; Joint swelling [ ] ; Back Pain [ ] ; Rash [ ]   Psych: Depression[ ] ; Anxiety[ ]   Heme: Bleeding problems [ ] ; Clotting disorders [ ] ; Anemia [ ]   Endocrine: Diabetes [ ] ; Thyroid dysfunction[ ]    Past Medical History:  Diagnosis Date   Hydrocele in adult    Hypertension    Inguinal hernia    Ischemic cardiomyopathy    Smoker 03/16/2021  smokes 1/2 ppd PTA per hx   STEMI involving left anterior descending coronary artery ([place]) 03/12/2021    Current Outpatient Medications  Medication Sig Dispense Refill   amLODipine (NORVASC) 10 MG tablet Take 1 tablet (10 mg total) by mouth daily. 90 tablet 1   aspirin EC 81 MG EC tablet Take 1 tablet (81 mg total) by mouth daily. Swallow whole. 30 tablet 0   atorvastatin (LIPITOR) 20 MG tablet Take 1 tablet (20 mg total) by mouth daily. 90 tablet 3   carvedilol (COREG) 6.25 MG tablet Take 1 tablet (6.25 mg total) by mouth 2 (two) times daily with  a meal. 180 tablet 1   hydrochlorothiazide (HYDRODIURIL) 12.5 MG tablet Take 1 tablet (12.5 mg total) by mouth daily. 90 tablet 1   nitroGLYCERIN (NITROSTAT) 0.4 MG SL tablet Place 1 tablet (0.4 mg total) under the tongue every 5 (five) minutes as needed for chest pain. 90 tablet 0   ticagrelor (BRILINTA) 90 MG TABS tablet Take 1 tablet (90 mg total) by mouth 2 (two) times daily. 180 tablet 1   No current facility-administered medications for this visit.    No Known Allergies    Social History   Socioeconomic History   Marital status: Single    Spouse name: Not on file   Number of children: Not on file   Years of education: Not on file   Highest education level: Not on file  Occupational History   Not on file  Tobacco Use   Smoking status: Former    Packs/day: 0.50    Types: Cigarettes   Smokeless tobacco: Never  Vaping Use   Vaping Use: Never used  Substance and Sexual Activity   Alcohol use: No   Drug use: Yes    Types: Marijuana    Comment: occasionally    Sexual activity: Not on file  Other Topics Concern   Not on file  Social History Narrative   ** Merged History Encounter **       Social Determinants of Health   Financial Resource Strain: Medium Risk   Difficulty of Paying Living Expenses: Somewhat hard  Food Insecurity: Food Insecurity Present   Worried About Charity fundraiser in the Last Year: Never true   Ran Out of Food in the Last Year: Sometimes true  Transportation Needs: No Transportation Needs   Lack of Transportation (Medical): No   Lack of Transportation (Non-Medical): No  Physical Activity: Not on file  Stress: Not on file  Social Connections: Not on file  Intimate Partner Violence: Not on file      Family History  Problem Relation Age of Onset   Heart disease Neg Hx     There were no vitals filed for this visit.  PHYSICAL EXAM: General:  Well appearing. No respiratory difficulty HEENT: normal Neck: supple. no JVD. Carotids 2+  bilat; no bruits. No lymphadenopathy or thryomegaly appreciated. Cor: PMI nondisplaced. Regular rate & rhythm. No rubs, gallops or murmurs. Lungs: clear Abdomen: soft, nontender, nondistended. No hepatosplenomegaly. No bruits or masses. Good bowel sounds. Extremities: no cyanosis, clubbing, rash, edema Neuro: alert & oriented x 3, cranial nerves grossly intact. moves all 4 extremities w/o difficulty. Affect pleasant.  ECG:   ASSESSMENT & PLAN: 1. Cardiogenic shock (acute systolic HF) - Secondary to anterior ST elevation MI/OOH arrest - EF 20-25% on echo with akinesis of anteroseptal wall and apex - Bedside echo EF 35-40% 03/14/21.  - Repeat Limited ECHO 9/26 w/ improved EF, 40-45%   -  Co-ox stable off milrinone. 69%.   - Volume status stable. CVP 2-3  - Continue entresto 97-103 bid - Continue spiro 25 mg daily - Stop Digoxin w/ improved EF  - Continue carvedilol 6.25 bid - Continue dapaglifozin 10 mg daily.  - Renal function stable.    2. CAD/Anterior ST elevation MI: - s/p PCI/DES to LAD as above - DAPT with aspirin and ticagrelor - No chest pain.  - Continue statin, ? blocker and MRA  - CR seeing   3. VT/VF arrest: - D/t #2 - no further VT/VF  - off amio  - EF improving, now 40-45%. No indication for LifeVest    4. Ischemic bowel - confirmed on CT. No perf - clinically improved w/ zosyn    5. AKI: -Scr peaked at 2.7-->1.5 -->pending  -D/t ATN/shock.  -Baseline not certain.     6. Elevated LFTs: -Due to shock liver -Trending down   6. Acute respiratory failure: -Secondary to AMI and shock -Extubated 9/22 - stable on room air.    7. Hypokalemia: - BMP in process  - c/w KCl  - Continue spiro 25    8. Tobacco use: - understands need for cessation   9. Leukocytosis/fever: - likely due to MI and ischemic bowel - WBC trending down.  - completed 5 day course of zosyn    10. Hemoptysis -Likely secondary to CPR +trauma from ETT -CT chest noted  groundglass opacities in both upper lobes could reflect pulmonary hemorrhage -Continues to have scant hemoptysis but improving, Hgb stable at 12  -TRH d/w pulmonary, recommended supportive care     Stable for d/c from cardiac standpoint.    Cardiac Meds for D/c.  ASA 81 mg daily  Brilinta 90 mg bid Atorvastatin 80 mg daily  Coreg 6.25 mg bid Farxiga 10 mg daily  Entresto 97-103 mg bid  Spironolactone 25 mg daily  Potassium 40 meq three times a day    Will get all HF meds through HF fund, except Entresto and Brilinta (will need patient assistance. We will assist w/ paper work). Will get first 30 day free supply through [facility].

## 2021-06-16 NOTE — Telephone Encounter (Signed)
Called to confirm/remind patient of their appointment at the [facility] on 06/17/21.   Patient reminded to bring all medications and/or complete list.  Confirmed patient has transportation. Gave directions, instructed to utilize valet parking.  Confirmed appointment prior to ending call.

## 2021-06-17 ENCOUNTER — Encounter (HOSPITAL_COMMUNITY): Payer: Self-pay

## 2021-06-17 ENCOUNTER — Telehealth (INDEPENDENT_AMBULATORY_CARE_PROVIDER_SITE_OTHER): Payer: Self-pay

## 2021-06-17 NOTE — Telephone Encounter (Signed)
Patient is aware that results are not available. Will contact when results are in. Maryjean Morn, CMA    Copied from CRM [id number]. Topic: General - Other >> Jun 16, 2021  4:29 PM Pawlus, Maxine Glenn A wrote: Reason for CRM: Pt requested a call back to go over his latest lab results, please advise.

## 2021-06-18 ENCOUNTER — Telehealth (INDEPENDENT_AMBULATORY_CARE_PROVIDER_SITE_OTHER): Payer: Self-pay

## 2021-06-18 LAB — CYTOLOGY, (ORAL, ANAL, URETHRAL) ANCILLARY ONLY
Chlamydia: NEGATIVE
Comment: NEGATIVE
Comment: NEGATIVE
Comment: NORMAL
Neisseria Gonorrhea: NEGATIVE
Trichomonas: NEGATIVE

## 2021-06-18 NOTE — Telephone Encounter (Signed)
Patient is aware of negative STD screening. Logan Brewer, CMA

## 2021-06-18 NOTE — Telephone Encounter (Signed)
-----   Message from Grayce Sessions, NP sent at 06/18/2021  2:10 PM EST ----- STD negative

## 2021-06-29 ENCOUNTER — Other Ambulatory Visit: Payer: Self-pay

## 2021-06-30 ENCOUNTER — Telehealth (HOSPITAL_COMMUNITY): Payer: Self-pay

## 2021-07-01 ENCOUNTER — Other Ambulatory Visit: Payer: Self-pay

## 2021-07-01 ENCOUNTER — Ambulatory Visit (HOSPITAL_COMMUNITY)
Admission: RE | Admit: 2021-07-01 | Discharge: 2021-07-01 | Disposition: A | Discharge status: Home / Self Care | Payer: Self-pay | Source: Ambulatory Visit | Attending: Family Medicine | Admitting: Family Medicine

## 2021-07-01 ENCOUNTER — Telehealth (HOSPITAL_COMMUNITY): Payer: Self-pay

## 2021-07-01 ENCOUNTER — Encounter (HOSPITAL_COMMUNITY): Payer: Self-pay

## 2021-07-01 VITALS — BP 144/78 | HR 74 | Wt 187.2 lb

## 2021-07-01 DIAGNOSIS — I5022 Chronic systolic (congestive) heart failure: Secondary | ICD-10-CM | POA: Insufficient documentation

## 2021-07-01 DIAGNOSIS — E782 Mixed hyperlipidemia: Secondary | ICD-10-CM

## 2021-07-01 DIAGNOSIS — Z596 Low income: Secondary | ICD-10-CM | POA: Insufficient documentation

## 2021-07-01 DIAGNOSIS — Z8674 Personal history of sudden cardiac arrest: Secondary | ICD-10-CM | POA: Insufficient documentation

## 2021-07-01 DIAGNOSIS — Z7982 Long term (current) use of aspirin: Secondary | ICD-10-CM | POA: Insufficient documentation

## 2021-07-01 DIAGNOSIS — Z955 Presence of coronary angioplasty implant and graft: Secondary | ICD-10-CM | POA: Insufficient documentation

## 2021-07-01 DIAGNOSIS — Z720 Tobacco use: Secondary | ICD-10-CM

## 2021-07-01 DIAGNOSIS — E871 Hypo-osmolality and hyponatremia: Secondary | ICD-10-CM

## 2021-07-01 DIAGNOSIS — Z8679 Personal history of other diseases of the circulatory system: Secondary | ICD-10-CM

## 2021-07-01 DIAGNOSIS — Z5941 Food insecurity: Secondary | ICD-10-CM | POA: Insufficient documentation

## 2021-07-01 DIAGNOSIS — Z79899 Other long term (current) drug therapy: Secondary | ICD-10-CM | POA: Insufficient documentation

## 2021-07-01 DIAGNOSIS — I2510 Atherosclerotic heart disease of native coronary artery without angina pectoris: Secondary | ICD-10-CM | POA: Insufficient documentation

## 2021-07-01 DIAGNOSIS — Z91199 Patient's noncompliance with other medical treatment and regimen due to unspecified reason: Secondary | ICD-10-CM | POA: Insufficient documentation

## 2021-07-01 DIAGNOSIS — I25119 Atherosclerotic heart disease of native coronary artery with unspecified angina pectoris: Secondary | ICD-10-CM

## 2021-07-01 DIAGNOSIS — F17210 Nicotine dependence, cigarettes, uncomplicated: Secondary | ICD-10-CM | POA: Insufficient documentation

## 2021-07-01 DIAGNOSIS — I110 Hypertensive heart disease with heart failure: Secondary | ICD-10-CM | POA: Insufficient documentation

## 2021-07-01 DIAGNOSIS — E860 Dehydration: Secondary | ICD-10-CM

## 2021-07-01 LAB — CBC
HCT: 39.4 % (ref 39.0–52.0)
Hemoglobin: 13.9 g/dL (ref 13.0–17.0)
MCH: 32.3 pg (ref 26.0–34.0)
MCHC: 35.3 g/dL (ref 30.0–36.0)
MCV: 91.6 fL (ref 80.0–100.0)
Platelets: 279 10*3/uL (ref 150–400)
RBC: 4.3 MIL/uL (ref 4.22–5.81)
RDW: 12.4 % (ref 11.5–15.5)
WBC: 6.4 10*3/uL (ref 4.0–10.5)
nRBC: 0 % (ref 0.0–0.2)

## 2021-07-01 LAB — BRAIN NATRIURETIC PEPTIDE: B Natriuretic Peptide: 55.1 pg/mL (ref 0.0–100.0)

## 2021-07-01 LAB — BASIC METABOLIC PANEL
Anion gap: 11 (ref 5–15)
BUN: 17 mg/dL (ref 8–23)
CO2: 24 mmol/L (ref 22–32)
Calcium: 9.4 mg/dL (ref 8.9–10.3)
Chloride: 101 mmol/L (ref 98–111)
Creatinine, Ser: 1.34 mg/dL — ABNORMAL HIGH (ref 0.61–1.24)
GFR, Estimated: 60 mL/min — ABNORMAL LOW (ref >60)
Glucose, Bld: 159 mg/dL — ABNORMAL HIGH (ref 70–99)
Potassium: 3.4 mmol/L — ABNORMAL LOW (ref 3.5–5.1)
Sodium: 136 mmol/L (ref 135–145)

## 2021-07-01 MED ORDER — ENTRESTO 24-26 MG PO TABS: 1.0000 | ORAL_TABLET | Freq: Two times a day (BID) | ORAL | 3 refills | Status: DC

## 2021-07-01 MED ORDER — POTASSIUM CHLORIDE ER 10 MEQ PO TBCR
10.0000 meq | EXTENDED_RELEASE_TABLET | Freq: Every day | ORAL | 3 refills | Status: DC
  Filled 2021-07-01: qty 90, 90d supply, fill #0

## 2021-07-01 MED ORDER — ENTRESTO 24-26 MG PO TABS
1.0000 | ORAL_TABLET | Freq: Two times a day (BID) | ORAL | 3 refills | Status: DC
  Filled 2021-07-01: qty 180, 90d supply, fill #0

## 2021-07-01 NOTE — Telephone Encounter (Addendum)
Pt aware, agreeable, and verbalized understanding Script sent to pharmacy  ----- Message from Rafael Bihari, FNP sent at 07/01/2021  1:32 PM EST ----- Kidney function much improved! K is low. Starting [facility] today. Please increase K-rich foods and diet and start KCL 10 mEq daily. Will repeat labs at follow up

## 2021-07-01 NOTE — Patient Instructions (Signed)
Medication Changes:  Stop Hydrochlorothiazide  Start Entresto 24/26mg  Twice daily    Lab Work:  Labs done today, your results will be available in MyChart, we will contact you for abnormal readings.   Testing/Procedures:  Your physician has requested that you have an echocardiogram. Echocardiography is a painless test that uses sound waves to create images of your heart. It provides your doctor with information about the size and shape of your heart and how well your hearts chambers and valves are working. This procedure takes approximately one hour. There are no restrictions for this procedure.   Referrals:  You have been referred to pharmacy   Special Instructions // Education:  none  Follow-Up in: 6 weeks with NP and 12 weeks with Dr. Gala Romney   At the [facility], you and your health needs are our priority. We have a designated team specialized in the treatment of Heart Failure. This Care Team includes your primary Heart Failure Specialized Cardiologist (physician), Advanced Practice Providers (APPs- Physician Assistants and Nurse Practitioners), and Pharmacist who all work together to provide you with the care you need, when you need it.   You may see any of the following providers on your designated Care Team at your next follow up:  Dr Arvilla Meres Dr Carron Curie, NP Robbie Lis, [place] [facility] [place], [place] Karle Plumber, PharmD   Please be sure to bring in all your medications bottles to every appointment.   Need to Contact us:  If you have any questions or concerns before your next appointment please send [place] a message through [place] or call our office at [phone].    TO LEAVE A MESSAGE FOR THE NURSE SELECT OPTION 2, PLEASE LEAVE A MESSAGE INCLUDING: YOUR NAME DATE OF BIRTH CALL BACK NUMBER REASON FOR CALL**this is important as we prioritize the call backs  YOU WILL RECEIVE A CALL BACK THE SAME DAY  AS LONG AS YOU CALL BEFORE 4:00 PM

## 2021-07-01 NOTE — Progress Notes (Addendum)
ADVANCED HF CLINIC CONSULT NOTE   Primary Care: Kerin Perna, NP HF Cardiologist: Dr. Haroldine Laws  HPI: Logan Brewer is a 66 y.o. male with history of uncontrolled HTN and heavy, longstanding tobacco use, systolic heart failure, CAD, and VT/VF arrest.   Developed CP 03/11/21. He was on way to ED via his wife and became unresponsive. CPR started when EMS arrived about 10 minutes later. Had recurrent VT/VF arrest and required multiple defibrillations. He was intubated. Given amiodarone bolus followed by drip. Recurrent VF/polymorphic VT requiring defibrillation. Lidocaine started. ECG with sinus tach, ST elevations in anterior leads. Underwent LHC showing 99% ostial to proximal LAD treated with PCI/DES. Successful defibrillation X 2  for polymorphic VT/VF while crossing into LV. LVEDP initially 45 mmHg > improved to 28 mmHg after furosemide. AHF consulted. Milrinone gtt started with marginal Co-Ox, eventually able to wean off. Hospitalization c/b ischemic bowel, no perforation. GDMT titrated. No LifeVest at discharge with improved EF on echo, EF up to 40%. Discharged home, weight 178 lbs.  Readmitted 10/22 with CP and SOB. Cards consulted and repeat echo showed EF 40-45%, unchanged from prior. hsTnI not consistent with ACS. No clear relationship to ticagrelor. Found to have AKI and GDMT held at discharge.   He no showed and cancelled post hospital follow up x 3.  Today he returns for post HF follow up. He has SOB walking on flat ground for long distances. He has just started back to work full time at a home [employer]. He has chest soreness from previous CPR and defibrillations when he presses on his sternum. Denies dizziness, edema, or PND/Orthopnea. Appetite ok. No fever or chills. He does not weigh at home. Taking all medications. No longer smoking cigarettes but occasional cigar.Smokes THC. No ETOH/drugs.  Cardiac Studies:  - Ltd Echo (10/22): EF 40-45%, moderate LV dysfunction,  mild LVH, RV ok,  - Echo 03/12/21 EF 20-25%, akinesis of anteroseptal wall and apex, RV okay, no MR, IVC normal in size  - R/LHC 9/22:  severe single vessel CAD, 95-99% ostial LAD s/p PCI + DES. EF 20-25%. RV normal. No MR, moderate pulm hypertension   - Echo 03/16/21, post PCI:  EF 40% with demonstrated regional wall motion abnormality, of the left ventricular mid apical anterior wall and apical segment.  Review of Systems: [y] = yes, [ ]  = no   General: Weight gain [ ] ; Weight loss [ ] ; Anorexia [ ] ; Fatigue Logan Brewer ]; Fever [ ] ; Chills [ ] ; Weakness Logan Brewer ]  Cardiac: Chest pain/pressure [ ] ; Resting SOB [ ] ; Exertional SOB Logan Brewer ]; Orthopnea [ ] ; Pedal Edema [ ] ; Palpitations [ ] ; Syncope [ ] ; Presyncope [ ] ; Paroxysmal nocturnal dyspnea[ ]   Pulmonary: Cough [ ] ; Wheezing[ ] ; Hemoptysis[ ] ; Sputum [ ] ; Snoring [ ]   GI: Vomiting[ ] ; Dysphagia[ ] ; Melena[ ] ; Hematochezia [ ] ; Heartburn[ ] ; Abdominal pain [ ] ; Constipation [ ] ; Diarrhea [ ] ; BRBPR [ ]   GU: Hematuria[ ] ; Dysuria [ ] ; Nocturia[ ]   Vascular: Pain in legs with walking [ ] ; Pain in feet with lying flat [ ] ; Non-healing sores [ ] ; Stroke [ ] ; TIA [ ] ; Slurred speech [ ] ;  Neuro: Headaches[ ] ; Vertigo[ ] ; Seizures[ ] ; Paresthesias[ ] ;Blurred vision [ ] ; Diplopia [ ] ; Vision changes [ ]   Ortho/Skin: Arthritis [ ] ; Joint pain [ ] ; Muscle pain [ ] ; Joint swelling [ ] ; Back Pain [ ] ; Rash [ ]   Psych: Depression[ ] ; Anxiety[y ]  Heme: Bleeding problems [ ] ;  Clotting disorders [ ] ; Anemia [ ]   Endocrine: Diabetes [ ] ; Thyroid dysfunction[ ]   Past Medical History:  Diagnosis Date   Hydrocele in adult    Hypertension    Inguinal hernia    Ischemic cardiomyopathy    Smoker 03/16/2021   smokes 1/2 ppd PTA per hx   STEMI involving left anterior descending coronary artery ([place]) 03/12/2021   Current Outpatient Medications  Medication Sig Dispense Refill   amLODipine (NORVASC) 10 MG tablet Take 1 tablet (10 mg total) by mouth daily. 90 tablet 1    aspirin EC 81 MG EC tablet Take 1 tablet (81 mg total) by mouth daily. Swallow whole. 30 tablet 0   atorvastatin (LIPITOR) 20 MG tablet Take 1 tablet (20 mg total) by mouth daily. 90 tablet 3   carvedilol (COREG) 6.25 MG tablet Take 1 tablet (6.25 mg total) by mouth 2 (two) times daily with a meal. 180 tablet 1   hydrochlorothiazide (HYDRODIURIL) 12.5 MG tablet Take 1 tablet (12.5 mg total) by mouth daily. 90 tablet 1   nitroGLYCERIN (NITROSTAT) 0.4 MG SL tablet Place 1 tablet (0.4 mg total) under the tongue every 5 (five) minutes as needed for chest pain. 90 tablet 0   ticagrelor (BRILINTA) 90 MG TABS tablet Take 1 tablet (90 mg total) by mouth 2 (two) times daily. 180 tablet 1   No current facility-administered medications for this encounter.   No Known Allergies  Social History   Socioeconomic History   Marital status: Single    Spouse name: Not on file   Number of children: Not on file   Years of education: Not on file   Highest education level: Not on file  Occupational History   Not on file  Tobacco Use   Smoking status: Former    Packs/day: 0.50    Types: Cigarettes   Smokeless tobacco: Never  Vaping Use   Vaping Use: Never used  Substance and Sexual Activity   Alcohol use: No   Drug use: Yes    Types: Marijuana    Comment: occasionally    Sexual activity: Not on file  Other Topics Concern   Not on file  Social History Narrative   ** Merged History Encounter **       Social Determinants of Health   Financial Resource Strain: Medium Risk   Difficulty of Paying Living Expenses: Somewhat hard  Food Insecurity: Food Insecurity Present   Worried About Charity fundraiser in the Last Year: Never true   Ran Out of Food in the Last Year: Sometimes true  Transportation Needs: No Transportation Needs   Lack of Transportation (Medical): No   Lack of Transportation (Non-Medical): No  Physical Activity: Not on file  Stress: Not on file  Social Connections: Not on file   Intimate Partner Violence: Not on file   Family History  Problem Relation Age of Onset   Heart disease Neg Hx    BP (!) 144/78    Pulse 74    Wt 84.9 kg    SpO2 98%    BMI 28.46 kg/m   Wt Readings from Last 3 Encounters:  07/01/21 84.9 kg  06/05/21 84.7 kg  05/20/21 85.1 kg   PHYSICAL EXAM: General:  NAD. No resp difficulty HEENT: Normal Neck: Supple. No JVD. Carotids 2+ bilat; no bruits. No lymphadenopathy or thryomegaly appreciated. Cor: PMI nondisplaced. Regular rate & rhythm. No rubs, gallops or murmurs. Lungs: Clear Abdomen: Soft, nontender, nondistended. No hepatosplenomegaly. No bruits or masses.  Good bowel sounds. Extremities: No cyanosis, clubbing, rash, edema Neuro: Alert & oriented x 3, cranial nerves grossly intact. Moves all 4 extremities w/o difficulty. Affect pleasant.  ECG: NSR, inferior/lateral ST changes consistent with prior on 04/23/21 (personally reviewed).  ASSESSMENT & PLAN: 1. Chronic Systolic Heart Failure - Secondary to anterior ST elevation MI/OOH arrest - EF 20-25% on echo with akinesis of anteroseptal wall and apex - Bedside echo EF 35-40% 03/14/21.  - Repeat Limited echo 03/16/21 w/ improved EF, 40-45%   - NYHA II-early III today, volume looks ok on exam. - Plan to add back GDMT as able. - Stop HCTZ. - Start Entresto 24/26 mg bid. - Continue carvedilol 6.25 bid - Add Farxiga next. - Plan for echo after GDMT optimized. - Labs today, repeat BMET in 10-14 days.   2. CAD - s/p PCI/DES to LAD as above - Continue DAPT with aspirin and ticagrelor. - No chest pain.  - Continue statin and ? blocker. - ? Cardiac Rehab   3. H/o VT/VF arrest: - 2/2 #2 - Off amio  - EF improving, now 40-45%. No indication for LifeVest.    4. Tobacco use: - Discussed cessation  5. Non-compliance - Discuss paramedicine at next visit - Engage HFSW for further SDOH needs.  Follow up in 3 weeks with PharmD (add Wilder Glade or spiro), 6 weeks with APP and 12 weeks  with Dr. Haroldine Laws + echo.  Allena Katz, FNP-BC 07/01/21

## 2021-07-01 NOTE — Addendum Note (Signed)
Encounter addended by: Jacklynn Ganong, FNP on: 07/01/2021 9:23 PM  Actions taken: Clinical Note Signed

## 2021-07-01 NOTE — Addendum Note (Signed)
Encounter addended by: Rafael Bihari, FNP on: 07/01/2021 9:21 PM  Actions taken: Clinical Note Signed

## 2021-07-01 NOTE — Telephone Encounter (Signed)
error 

## 2021-07-03 ENCOUNTER — Other Ambulatory Visit: Payer: Self-pay

## 2021-07-06 ENCOUNTER — Other Ambulatory Visit: Payer: Self-pay

## 2021-07-06 ENCOUNTER — Other Ambulatory Visit (HOSPITAL_BASED_OUTPATIENT_CLINIC_OR_DEPARTMENT_OTHER): Payer: Self-pay

## 2021-07-06 ENCOUNTER — Telehealth (HOSPITAL_COMMUNITY): Payer: Self-pay

## 2021-07-06 DIAGNOSIS — I5022 Chronic systolic (congestive) heart failure: Secondary | ICD-10-CM

## 2021-07-06 NOTE — Telephone Encounter (Addendum)
Pt aware, agreeable, and verbalized understanding Has potassium prescription already. BMET ordered to be obtained at pcp visit next week.   ----- Message from Rafael Bihari, [place] sent at 07/03/2021  8:04 AM EST ----- Please call

## 2021-07-13 ENCOUNTER — Telehealth (HOSPITAL_COMMUNITY): Payer: Self-pay

## 2021-07-13 NOTE — Telephone Encounter (Signed)
Called and spoke with pt in regards to CR, pt stated he is not interested at this time.   Closed referral 

## 2021-07-16 ENCOUNTER — Other Ambulatory Visit (HOSPITAL_COMMUNITY): Payer: Self-pay

## 2021-07-16 NOTE — Progress Notes (Incomplete)
***In Progress***    Advanced Heart Failure Clinic Note   Primary Care: Grayce Sessions, NP HF Cardiologist: Dr. Gala Romney  HPI:  Logan Brewer is a 66 y.o. male with history of uncontrolled HTN and heavy, longstanding tobacco use, systolic heart failure, CAD, and VT/VF arrest.   Developed CP 03/11/21. He was on way to ED via his wife and became unresponsive. CPR started when EMS arrived about 10 minutes later. Had recurrent VT/VF arrest and required multiple defibrillations. He was intubated. Given amiodarone bolus followed by drip. Recurrent VF/polymorphic VT requiring defibrillation. Lidocaine started. ECG with sinus tach, ST elevations in anterior leads. Underwent LHC showing 99% ostial to proximal LAD treated with PCI/DES. Successful defibrillation X 2  for polymorphic VT/VF while crossing into LV. LVEDP initially 45 mmHg > improved to 28 mmHg after furosemide. AHF consulted. Milrinone gtt started with marginal Co-Ox, eventually able to wean off. Hospitalization c/b ischemic bowel, no perforation. GDMT titrated. No LifeVest at discharge with improved EF on echo, EF up to 40%. Discharged home, weight 178 lbs.   Readmitted 03/2021 with CP and SOB. Cards consulted and repeat echo showed EF 40-45%, unchanged from prior. hsTnI not consistent with ACS. No clear relationship to ticagrelor. Found to have AKI and GDMT held at discharge.    He no showed and cancelled post hospital follow up x 3.   Recently returned to AHF for post HF follow up 07/01/21. He was SOB walking on flat ground for long distances. 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 He reported chest soreness from previous CPR and defibrillations when he presses on his sternum. Denied dizziness, edema, or PND/Orthopnea. Appetite was ok. No fever or chills. He reported that he does not weigh at home. Reported taking all medications. No longer smoking cigarettes but occasional cigar. Smokes THC. No  ETOH/drugs.  Today he returns to HF clinic for pharmacist medication titration. At last visit with APP, hydrochlorothiazide was stopped and Entresto 24.26 mg BID was started.   Shortness of breath/dyspnea on exertion? {YES NO:22349}  Orthopnea/PND? {YES NO:22349} Edema? {YES NO:22349} Lightheadedness/dizziness? {YES NO:22349} Daily weights at home? {YES NO:22349} Blood pressure/heart rate monitoring at home? {YES [id number] Following low-sodium/fluid-restricted diet? {YES NO:22349}  HF Medications: Carvedilol 6.25 mg BID Entresto 24/26 mg BID Potassium chloride 10 mEq daily  Has the patient been experiencing any side effects to the medications prescribed?  {YES NO:22349}  Does the patient have any problems obtaining medications due to transportation or finances?   {YES NO:22349}  Understanding of regimen: {excellent/good/fair/poor:19665} Understanding of indications: {excellent/good/fair/poor:19665} Potential of compliance: {excellent/good/fair/poor:19665} Patient understands to avoid NSAIDs. Patient understands to avoid decongestants.    Pertinent Lab Values: Serum creatinine ***, BUN ***, Potassium ***, Sodium ***, BNP ***, Magnesium ***, Digoxin ***   Vital Signs: Weight: *** (last clinic weight: ***) Blood pressure: ***  Heart rate: ***   Assessment/Plan: 1. Chronic Systolic Heart Failure - Secondary to anterior ST elevation MI/OOH arrest - EF 20-25% on echo with akinesis of anteroseptal wall and apex - Bedside echo EF 35-40% 03/14/21.  - Repeat Limited echo 03/16/21 w/ improved EF, 40-45%   - NYHA II-early III today, volume looks ok on exam. - Continue carvedilol 6.25 mg BID - Continue Entresto 24/26 mg BID. - Plan for echo after GDMT optimized.   2. CAD - s/p PCI/DES to LAD as above - Continue DAPT with aspirin and ticagrelor. - No chest pain.  - Continue statin and ? blocker. - ?  Cardiac Rehab   3. H/o VT/VF arrest: - 2/2 #2 - Off amiodarone  - EF  improving, now 40-45%.    4. Tobacco use: - Discussed cessation   5. Non-compliance - Discuss paramedicine at next visit - Engage HFSW for further SDOH needs.  Follow up ***   Karle Plumber, PharmD, BCPS, BCCP, CPP Heart Failure Clinic Pharmacist [phone]

## 2021-07-17 ENCOUNTER — Ambulatory Visit (INDEPENDENT_AMBULATORY_CARE_PROVIDER_SITE_OTHER): Payer: Self-pay | Admitting: Primary Care

## 2021-07-24 ENCOUNTER — Ambulatory Visit: Payer: Self-pay | Admitting: Adult Health

## 2021-07-24 NOTE — Progress Notes (Deleted)
Office Visit    Patient Name: Logan Brewer Date of Encounter: 07/24/2021  Primary Care Provider:  Grayce Sessions, NP Primary Cardiologist:  Bryan Lemma, MD  Chief Complaint    66 year old male with a history of CAD s/p STEMI, DES-LAD, VT/VF arrest in 02/2021, chronic systolic heart failure, ICM, uncontrolled hypertension, longstanding tobacco use, and inguinal hernia who presents for follow-up related to CAD and heart failure.  Past Medical History    Past Medical History:  Diagnosis Date   Hydrocele in adult    Hypertension    Inguinal hernia    Ischemic cardiomyopathy    Smoker 03/16/2021   smokes 1/2 ppd PTA per hx   STEMI involving left anterior descending coronary artery ([facility]) 03/12/2021   Past Surgical History:  Procedure Laterality Date   APPENDECTOMY     CORONARY STENT INTERVENTION N/A 03/11/2021   Procedure: CORONARY STENT INTERVENTION;  Surgeon: Marykay Lex, MD;  Location: [facility] INVASIVE CV LAB;  Service: Cardiovascular;  Laterality: N/A;   CORONARY/GRAFT ACUTE MI REVASCULARIZATION N/A 03/11/2021   Procedure: Coronary/Graft Acute MI Revascularization;  Surgeon: Marykay Lex, MD;  Location: [facility] INVASIVE CV LAB;  Service: Cardiovascular;  Laterality: N/A;   HYDROCELE EXCISION Bilateral 11/21/2019   Procedure: HYDROCELECTOMY ADULT;  Surgeon: Malen Gauze, MD;  Location: [facility];  Service: Urology;  Laterality: Bilateral;  [id number]   HYDROCELE EXCISION Right 04/07/2020   Procedure: RIGHT HYDROCELECTOMY;  Surgeon: Malen Gauze, MD;  Location: [facility];  Service: Urology;  Laterality: Right;   INGUINAL HERNIA REPAIR Left 11/21/2019   Procedure: HERNIA REPAIR INGUINAL ADULT;  Surgeon: [name], MD;  Location: [facility];  Service: General;  Laterality: Left;  [id number]   RIGHT/LEFT HEART CATH AND CORONARY ANGIOGRAPHY N/A 03/11/2021   Procedure: RIGHT/LEFT HEART CATH AND CORONARY ANGIOGRAPHY;  Surgeon: Marykay Lex, MD;  Location: [facility] INVASIVE CV  LAB;  Service: Cardiovascular;  Laterality: N/A;    Allergies  No Known Allergies  History of Present Illness    66 year old male with the above past medical history including  CAD s/p STEMI, DES-LAD, VT/VF arrest in 02/2021, chronic systolic heart failure, ICM, uncontrolled hypertension, longstanding tobacco use, and inguinal hernia.  He presented to the ED in September 2022 with chest pain, loss of consciousness in the setting of  VT/VF arrest requiring multiple defibrillations. He was intubated in the setting of acute respiratory failure and cardiogenic shock.  EKG showed ST elevation.  He underwent cardiac catheterization which showed 99% o-pLAD s/p DES, mLAD 30%.  He had recurrent VT/VF during LHC, and was successfully defibrillated.  He was extubated on 03/12/2021. Initial echocardiogram on 03/12/2021 showed an EF of 20 to 25%, akinesis of the anteroseptal wall and apex, severe LV dysfunction, severe LVH, G2 DD. The advanced heart failure team was consulted during hospitalization, and he was started on milrinone, this was later discontinued. Repeat limited echocardiogram on 03/16/2021 showed improved EF, 40 to 45%.  He developed ischemic bowel in the setting of cardiogenic shock which resolved. Additionally, he developed AKI, anoxic brain injury, and shock liver, all of which, improved with treatment of cardiogenic shock.   He was readmitted to the hospital from 04/08/2021 to 04/11/2021 in the setting of chest pain and shortness of breath.  Cardiology was consulted. Troponin showed flat trend, repeat echocardiogram was unchanged from prior.his shortness of breath was not thought to be related to his Brilinta. Creatinine was elevated. In the setting of AKI, no further ischemic evaluation was  pursued and he was discharged home in stable condition.  He was last seen in the office on April 23, 2021 and was stable overall from a cardiac standpoint. He reported improved dyspnea.  Escalation of GDMT  limited in the setting of CKD.  Unable to titrate beta-blocker in the setting of low heart rate.  Referred to nephrology.  Additionally, he was seen in the heart advanced heart failure clinic on July 01, 2021 and was stable overall from a cardiac standpoint though he did report some ongoing dyspnea with exertion, volume status appeared stable.  HCTZ was stopped and Entresto was restarted.  With plans to reintroduce Farxiga or spironolactone at follow-up visit with PharmD, and repeat echo after GDMT optimized.  He presents today for follow-up.  Since his last visit  CAD: H/o VT/VF arrest: Chronic systolic heart failure: Tobacco abuse: Disposition:   Home Medications    Current Outpatient Medications  Medication Sig Dispense Refill   amLODipine (NORVASC) 10 MG tablet Take 1 tablet (10 mg total) by mouth daily. 90 tablet 1   aspirin EC 81 MG EC tablet Take 1 tablet (81 mg total) by mouth daily. Swallow whole. 30 tablet 0   atorvastatin (LIPITOR) 20 MG tablet Take 1 tablet (20 mg total) by mouth daily. 90 tablet 3   carvedilol (COREG) 6.25 MG tablet Take 1 tablet (6.25 mg total) by mouth 2 (two) times daily with a meal. 180 tablet 1   nitroGLYCERIN (NITROSTAT) 0.4 MG SL tablet Place 1 tablet (0.4 mg total) under the tongue every 5 (five) minutes as needed for chest pain. 90 tablet 0   potassium chloride (KLOR-CON) 10 MEQ tablet Take 1 tablet (10 mEq total) by mouth daily. 90 tablet 3   sacubitril-valsartan (ENTRESTO) 24-26 MG Take 1 tablet by mouth 2 (two) times daily. 180 tablet 3   ticagrelor (BRILINTA) 90 MG TABS tablet Take 1 tablet (90 mg total) by mouth 2 (two) times daily. 180 tablet 1   No current facility-administered medications for this visit.     Review of Systems    ***.  All other systems reviewed and are otherwise negative except as noted above.    Physical Exam    VS:  There were no vitals taken for this visit. , BMI There is no height or weight on file to calculate  BMI.     GEN: Well nourished, well developed, in no acute distress. HEENT: normal. Neck: Supple, no JVD, carotid bruits, or masses. Cardiac: RRR, no murmurs, rubs, or gallops. No clubbing, cyanosis, edema.  Radials/DP/PT 2+ and equal bilaterally.  Respiratory:  Respirations regular and unlabored, clear to auscultation bilaterally. GI: Soft, nontender, nondistended, BS + x 4. MS: no deformity or atrophy. Skin: warm and dry, no rash. Neuro:  Strength and sensation are intact. Psych: Normal affect.  Accessory Clinical Findings    ECG personally reviewed by me today - *** - no acute changes.  Lab Results  Component Value Date   WBC 6.4 07/01/2021   HGB 13.9 07/01/2021   HCT 39.4 07/01/2021   MCV 91.6 07/01/2021   PLT 279 07/01/2021   Lab Results  Component Value Date   CREATININE 1.34 (H) 07/01/2021   BUN 17 07/01/2021   NA 136 07/01/2021   K 3.4 (L) 07/01/2021   CL 101 07/01/2021   CO2 24 07/01/2021   Lab Results  Component Value Date   ALT 19 04/09/2021   AST 16 04/09/2021   ALKPHOS 71 04/09/2021   BILITOT 0.4  04/09/2021   Lab Results  Component Value Date   CHOL 154 03/12/2021   HDL 37 (L) 03/12/2021   LDLCALC 103 (H) 03/12/2021   TRIG 70 03/12/2021   CHOLHDL 4.2 03/12/2021    Lab Results  Component Value Date   HGBA1C 6.3 (A) 06/05/2021    Assessment & Plan    1.  ***   Joylene Grapes, NP 07/24/2021, 5:01 AM

## 2021-07-28 ENCOUNTER — Inpatient Hospital Stay (HOSPITAL_COMMUNITY): Admission: RE | Admit: 2021-07-28 | Payer: Self-pay | Source: Ambulatory Visit

## 2021-07-30 ENCOUNTER — Telehealth (HOSPITAL_COMMUNITY): Payer: Self-pay | Admitting: Pharmacy Technician

## 2021-07-30 NOTE — Telephone Encounter (Signed)
Advanced Heart Failure Patient Advocate Encounter  Called and spoke with the patient. Apparently his [place] is being delivered to the clinic instead of his home. He stated that he has updated the address with [employer] before.  Medication will be at the check in desk for him to pick up.  Archer Asa, CPhT

## 2021-07-31 ENCOUNTER — Ambulatory Visit (INDEPENDENT_AMBULATORY_CARE_PROVIDER_SITE_OTHER): Payer: Self-pay | Admitting: Primary Care

## 2021-07-31 ENCOUNTER — Other Ambulatory Visit: Payer: Self-pay

## 2021-07-31 ENCOUNTER — Encounter (INDEPENDENT_AMBULATORY_CARE_PROVIDER_SITE_OTHER): Payer: Self-pay | Admitting: Primary Care

## 2021-07-31 VITALS — BP 133/73 | HR 70 | Temp 98.2°F | Ht 67.0 in | Wt 186.2 lb

## 2021-07-31 DIAGNOSIS — I10 Essential (primary) hypertension: Secondary | ICD-10-CM

## 2021-07-31 DIAGNOSIS — M5417 Radiculopathy, lumbosacral region: Secondary | ICD-10-CM

## 2021-07-31 NOTE — Patient Instructions (Signed)
[phone]  Cardiologist phone number

## 2021-07-31 NOTE — Progress Notes (Signed)
[facility]   Mr. Logan Brewer is a 66 y.o. male presents for hypertension evaluation, Denies shortness of breath, headaches, chest pain or lower extremity edema, sudden onset, vision changes, unilateral weakness, dizziness, paresthesias   Patient reports adherence with medications.  Dietary habits include: monitoring sodium Exercise habits include:walk Family / Social history: No   Past Medical History:  Diagnosis Date   Hydrocele in adult    Hypertension    Inguinal hernia    Ischemic cardiomyopathy    Smoker 03/16/2021   smokes 1/2 ppd PTA per hx   STEMI involving left anterior descending coronary artery ([place]) 03/12/2021   Past Surgical History:  Procedure Laterality Date   APPENDECTOMY     CORONARY STENT INTERVENTION N/A 03/11/2021   Procedure: CORONARY STENT INTERVENTION;  Surgeon: Leonie Man, MD;  Location: [place] [facility];  Service: Cardiovascular;  Laterality: N/A;   CORONARY/GRAFT ACUTE MI REVASCULARIZATION N/A 03/11/2021   Procedure: Coronary/Graft Acute MI Revascularization;  Surgeon: Leonie Man, MD;  Location: [place] [facility];  Service: Cardiovascular;  Laterality: N/A;   HYDROCELE EXCISION Bilateral 11/21/2019   Procedure: HYDROCELECTOMY ADULT;  Surgeon: Cleon Gustin, MD;  Location: [facility];  Service: Urology;  Laterality: [id number]   HYDROCELE EXCISION Right 04/07/2020   Procedure: RIGHT HYDROCELECTOMY;  Surgeon: Cleon Gustin, MD;  Location: [facility];  Service: Urology;  Laterality: Right;   INGUINAL HERNIA REPAIR Left 11/21/2019   Procedure: HERNIA REPAIR INGUINAL ADULT;  Surgeon: Aviva Signs, MD;  Location: [facility];  Service: General;  Laterality: [id number]   RIGHT/LEFT HEART CATH AND CORONARY ANGIOGRAPHY N/A 03/11/2021   Procedure: RIGHT/LEFT HEART CATH AND CORONARY ANGIOGRAPHY;  Surgeon: Leonie Man, MD;  Location: [place] [facility];  Service: Cardiovascular;  Laterality: N/A;   No Known  Allergies Current Outpatient Medications on File Prior to Visit  Medication Sig Dispense Refill   amLODipine (NORVASC) 10 MG tablet Take 1 tablet (10 mg total) by mouth daily. 90 tablet 1   aspirin EC 81 MG EC tablet Take 1 tablet (81 mg total) by mouth daily. Swallow whole. 30 tablet 0   atorvastatin (LIPITOR) 20 MG tablet Take 1 tablet (20 mg total) by mouth daily. 90 tablet 3   carvedilol (COREG) 6.25 MG tablet Take 1 tablet (6.25 mg total) by mouth 2 (two) times daily with a meal. 180 tablet 1   nitroGLYCERIN (NITROSTAT) 0.4 MG SL tablet Place 1 tablet (0.4 mg total) under the tongue every 5 (five) minutes as needed for chest pain. 90 tablet 0   potassium chloride (KLOR-CON) 10 MEQ tablet Take 1 tablet (10 mEq total) by mouth daily. 90 tablet 3   sacubitril-valsartan (ENTRESTO) 24-26 MG Take 1 tablet by mouth 2 (two) times daily. 180 tablet 3   ticagrelor (BRILINTA) 90 MG TABS tablet Take 1 tablet (90 mg total) by mouth 2 (two) times daily. 180 tablet 1   No current facility-administered medications on file prior to visit.   Social History   Socioeconomic History   Marital status: Single    Spouse name: Not on file   Number of children: Not on file   Years of education: Not on file   Highest education level: Not on file  Occupational History   Not on file  Tobacco Use   Smoking status: Former    Packs/day: 0.50    Types: Cigarettes   Smokeless tobacco: Never  Vaping Use   Vaping Use: Never used  Substance and Sexual Activity   Alcohol use: No   Drug use: Yes    Types: Marijuana    Comment: occasionally    Sexual activity: Not on file  Other Topics Concern   Not on file  Social History Narrative   ** Merged History Encounter **       Social Determinants of Health   Financial Resource Strain: Medium Risk   Difficulty of Paying Living Expenses: Somewhat hard  Food Insecurity: Food Insecurity Present   Worried About Charity fundraiser in the Last Year: Never true    Ran Out of Food in the Last Year: Sometimes true  Transportation Needs: No Transportation Needs   Lack of Transportation (Medical): No   Lack of Transportation (Non-Medical): No  Physical Activity: Not on file  Stress: Not on file  Social Connections: Not on file  Intimate Partner Violence: Not on file   Family History  Problem Relation Age of Onset   Heart disease Neg Hx      OBJECTIVE:  Vitals:   07/31/21 0951  BP: 133/73  Pulse: 70  Temp: 98.2 F (36.8 C)  TempSrc: Oral  SpO2: 97%  Weight: 186 lb 3.2 oz (84.5 kg)  Height: 5\' 7"  (1.702 m)    Physical Exam Vitals reviewed.  HENT:     Head: Normocephalic.     Right Ear: Tympanic membrane normal.     Left Ear: Tympanic membrane normal.     Nose: Nose normal.  Eyes:     Extraocular Movements: Extraocular movements intact.     Pupils: Pupils are equal, round, and reactive to light.  Cardiovascular:     Rate and Rhythm: Normal rate and regular rhythm.  Pulmonary:     Effort: Pulmonary effort is normal.     Breath sounds: Normal breath sounds.  Abdominal:     General: Bowel sounds are normal.     Palpations: Abdomen is soft.  Musculoskeletal:        General: Normal range of motion.     Cervical back: Normal range of motion.  Skin:    General: Skin is warm and dry.  Neurological:     Mental Status: He is alert and oriented to person, place, and time.  Psychiatric:        Mood and Affect: Mood normal.        Behavior: Behavior normal.        Thought Content: Thought content normal.        Judgment: Judgment normal.    ROS Comprehensive ROS Pertinent positive and negative noted in HPI   Last 3 Office BP readings: BP Readings from Last 3 Encounters:  07/31/21 133/73  07/01/21 (!) 144/78  06/05/21 (!) 165/83    BMET    Component Value Date/Time   NA 136 07/01/2021 1150   NA 140 04/23/2021 1108   K 3.4 (L) 07/01/2021 1150   CL 101 07/01/2021 1150   CO2 24 07/01/2021 1150   GLUCOSE 159 (H) 07/01/2021  1150   BUN 17 07/01/2021 1150   BUN 12 04/23/2021 1108   CREATININE 1.34 (H) 07/01/2021 1150   CALCIUM 9.4 07/01/2021 1150   GFRNONAA 60 (L) 07/01/2021 1150   GFRAA >60 11/16/2019 1543    Renal function: CrCl cannot be calculated (Patient's most recent lab result is older than the maximum 21 days allowed.).  Clinical ASCVD: No  The ASCVD Risk score (Arnett DK, et al., 2019) failed to calculate for the following reasons:  The patient has a prior MI or stroke diagnosis  ASCVD risk factors include- [place]   ASSESSMENT & PLAN: Logan Brewer was seen today for blood pressure check.  Diagnoses and all orders for this visit:  Radiculopathy, lumbosacral region -     Ambulatory referral to Physical Therapy  Essential hypertension -Counseled on lifestyle modifications for blood pressure control including reduced dietary sodium, increased exercise, weight reduction and adequate sleep. Also, educated patient about the risk for cardiovascular events, stroke and heart attack. Also counseled patient about the importance of medication adherence. If you participate in smoking, it is important to stop using tobacco as this will increase the risks associated with uncontrolled blood pressure.   -Hypertension longstanding diagnosed currently amlodipine 10mg  daily  sacubitril-valsartan (ENTRESTO) daily  and  carvedilol 6.25 BID on current medications. Patient is adherent with current medications.   Goal BP:  For patients younger than 60: Goal BP < 130/80. For patients 60 and older: Goal BP < 140/90. For patients with diabetes: Goal BP < 130/80. Your most recent BP: 133/73  Minimize salt intake. Minimize alcohol intake Well controlled managed by cardiology    This note has been created with Dragon speech recognition software and Engineer, materials. Any transcriptional errors are unintentional.   Kerin Perna, NP 07/31/2021, 10:00 AM

## 2021-08-05 ENCOUNTER — Other Ambulatory Visit: Payer: Self-pay

## 2021-08-06 ENCOUNTER — Other Ambulatory Visit: Payer: Self-pay

## 2021-08-11 ENCOUNTER — Telehealth (HOSPITAL_COMMUNITY): Payer: Self-pay

## 2021-08-11 NOTE — Telephone Encounter (Signed)
Called to confirm/remind patient of their appointment at the [facility] on 08/12/21.   Patient reminded to bring all medications and/or complete list.  Confirmed patient has transportation. Gave directions, instructed to utilize valet parking.  Confirmed appointment prior to ending call.

## 2021-08-11 NOTE — Progress Notes (Signed)
ADVANCED [facility] CLINIC NOTE   Primary Care: Kerin Perna, NP HF Cardiologist: Dr. Haroldine Laws  HPI: Logan Brewer is a 66 y.o. male with history of uncontrolled HTN and heavy, longstanding tobacco use, systolic heart failure, CAD, and VT/VF arrest.   Developed CP 03/11/21. He was on way to ED via his wife and became unresponsive. CPR started when EMS arrived about 10 minutes later. Had recurrent VT/VF arrest and required multiple defibrillations. He was intubated. Given amiodarone bolus followed by drip. Recurrent VF/polymorphic VT requiring defibrillation. Lidocaine started. ECG with sinus tach, ST elevations in anterior leads. Underwent LHC showing 99% ostial to proximal LAD treated with PCI/DES. Successful defibrillation X 2  for polymorphic VT/VF while crossing into LV. LVEDP initially 45 mmHg > improved to 28 mmHg after furosemide. AHF consulted. Milrinone gtt started with marginal Co-Ox, eventually able to wean off. Hospitalization c/b ischemic bowel, no perforation. GDMT titrated. No LifeVest at discharge with improved EF on echo, EF up to 40%. Discharged home, weight 178 lbs.  Readmitted 10/22 with CP and SOB. Cards consulted and repeat echo showed EF 40-45%, unchanged from prior. hsTnI not consistent with ACS. No clear relationship to ticagrelor. Found to have AKI and GDMT held at discharge.   He no showed and cancelled post hospital follow up x 3.  Today he returns for HF follow up. Overall feeling fine. He is SOB walking further distances on flat ground and lifting groceries. Denies palpitations, abnormal  bleeding, CP, dizziness, edema, or PND/Orthopnea. Appetite ok. No fever or chills. He does not weigh at home. Taking all medications. Working full time at a home [employer]. No longer smoking cigarettes but occasional cigar. Smokes THC regularly. Asking about note for work about lifting restrictions.   Cardiac Studies:  - Ltd Echo (10/22): EF 40-45%, moderate LV  dysfunction, mild LVH, RV ok,  - Echo 03/12/21 EF 20-25%, akinesis of anteroseptal wall and apex, RV okay, no MR, IVC normal in size  - R/LHC 9/22:  severe single vessel CAD, 95-99% ostial LAD s/p PCI + DES. EF 20-25%. RV normal. No MR, moderate pulm hypertension   - Echo 03/16/21, post PCI:  EF 40% with demonstrated regional wall motion abnormality, of the left ventricular mid apical anterior wall and apical segment.   Past Medical History:  Diagnosis Date   Hydrocele in adult    Hypertension    Inguinal hernia    Ischemic cardiomyopathy    Smoker 03/16/2021   smokes 1/2 ppd PTA per hx   STEMI involving left anterior descending coronary artery ([place]) 03/12/2021   Current Outpatient Medications  Medication Sig Dispense Refill   amLODipine (NORVASC) 10 MG tablet Take 1 tablet (10 mg total) by mouth daily. 90 tablet 1   aspirin EC 81 MG EC tablet Take 1 tablet (81 mg total) by mouth daily. Swallow whole. 30 tablet 0   atorvastatin (LIPITOR) 20 MG tablet Take 1 tablet (20 mg total) by mouth daily. 90 tablet 3   carvedilol (COREG) 6.25 MG tablet Take 1 tablet (6.25 mg total) by mouth 2 (two) times daily with a meal. 180 tablet 1   potassium chloride (KLOR-CON) 10 MEQ tablet Take 1 tablet (10 mEq total) by mouth daily. 90 tablet 3   sacubitril-valsartan (ENTRESTO) 24-26 MG Take 1 tablet by mouth 2 (two) times daily. 180 tablet 3   ticagrelor (BRILINTA) 90 MG TABS tablet Take 1 tablet (90 mg total) by mouth 2 (two) times daily. 180 tablet 1   nitroGLYCERIN (  NITROSTAT) 0.4 MG SL tablet Place 1 tablet (0.4 mg total) under the tongue every 5 (five) minutes as needed for chest pain. 90 tablet 0   No current facility-administered medications for this encounter.   No Known Allergies  Social History   Socioeconomic History   Marital status: Single    Spouse name: Not on file   Number of children: Not on file   Years of education: Not on file   Highest education level: Not on file   Occupational History   Not on file  Tobacco Use   Smoking status: Former    Packs/day: 0.50    Types: Cigarettes   Smokeless tobacco: Never  Vaping Use   Vaping Use: Never used  Substance and Sexual Activity   Alcohol use: No   Drug use: Yes    Types: Marijuana    Comment: occasionally    Sexual activity: Not on file  Other Topics Concern   Not on file  Social History Narrative   ** Merged History Encounter **       Social Determinants of Health   Financial Resource Strain: Medium Risk   Difficulty of Paying Living Expenses: Somewhat hard  Food Insecurity: Food Insecurity Present   Worried About Charity fundraiser in the Last Year: Never true   Ran Out of Food in the Last Year: Sometimes true  Transportation Needs: No Transportation Needs   Lack of Transportation (Medical): No   Lack of Transportation (Non-Medical): No  Physical Activity: Not on file  Stress: Not on file  Social Connections: Not on file  Intimate Partner Violence: Not on file   Family History  Problem Relation Age of Onset   Heart disease Neg Hx    BP 130/68    Pulse 70    Ht 5\' 7"  (1.702 m)    Wt 85.3 kg (188 lb)    SpO2 96%    BMI 29.44 kg/m   Wt Readings from Last 3 Encounters:  08/12/21 85.3 kg (188 lb)  07/31/21 84.5 kg (186 lb 3.2 oz)  07/01/21 84.9 kg (187 lb 3.2 oz)   PHYSICAL EXAM: General:  NAD. No resp difficulty HEENT: Normal Neck: Supple. No JVD. Carotids 2+ bilat; no bruits. No lymphadenopathy or thryomegaly appreciated. Cor: PMI nondisplaced. Regular rate & rhythm. No rubs, gallops or murmurs. Lungs: Clear Abdomen: Soft, nontender, nondistended. No hepatosplenomegaly. No bruits or masses. Good bowel sounds. Extremities: No cyanosis, clubbing, rash, edema Neuro: Alert & oriented x 3, cranial nerves grossly intact. Moves all 4 extremities w/o difficulty. Affect pleasant.  ASSESSMENT & PLAN: 1. Chronic Systolic Heart Failure - Secondary to anterior STEMI/OOH arrest - Echo  (9/22): EF 20-25% on echo with akinesis of anteroseptal wall and apex - Bedside echo EF 35-40% 03/14/21.  - Repeat Limited echo 03/16/21 w/ improved EF, 40-45%   - NYHA II-early III today, volume looks ok on exam. - Start spiro 12.5 mg daily. Stop KCL suppl. - Continue Entresto 24/26 mg bid. - Continue carvedilol 6.25 bid - Add Farxiga next. - Plan for echo after GDMT optimized. - BMET today, repeat in 1 week.   2. CAD: - s/p PCI/DES to LAD as above - Continue DAPT with aspirin and ticagrelor. - No s/s ischemia.  - Continue statin and ? blocker. - ? Cardiac Rehab.   3. H/o VT/VF arrest: - 2/2 #2 - Off amio.  - EF improving, now 40-45%. No indication for LifeVest.    4. Tobacco use: - Discussed cessation.  5. Non-compliance - Improved medication compliance.  Follow up in 2 weeks with PharmD (start SGLT2i, would stop amlodipine if SBP < 110) and keep follow up next month with Dr. Haroldine Laws + echo. He was given a note for work at today's visit discussing activity restrictions.  Allena Katz, FNP-BC 08/12/21

## 2021-08-12 ENCOUNTER — Encounter (HOSPITAL_COMMUNITY): Payer: Self-pay

## 2021-08-12 ENCOUNTER — Other Ambulatory Visit: Payer: Self-pay

## 2021-08-12 ENCOUNTER — Telehealth (HOSPITAL_COMMUNITY): Payer: Self-pay | Admitting: Surgery

## 2021-08-12 ENCOUNTER — Ambulatory Visit (HOSPITAL_COMMUNITY)
Admission: RE | Admit: 2021-08-12 | Discharge: 2021-08-12 | Disposition: A | Discharge status: Home / Self Care | Payer: Self-pay | Source: Ambulatory Visit | Attending: Family Medicine | Admitting: Family Medicine

## 2021-08-12 VITALS — BP 130/68 | HR 70 | Ht 67.0 in | Wt 188.0 lb

## 2021-08-12 DIAGNOSIS — F17290 Nicotine dependence, other tobacco product, uncomplicated: Secondary | ICD-10-CM | POA: Insufficient documentation

## 2021-08-12 DIAGNOSIS — R0602 Shortness of breath: Secondary | ICD-10-CM | POA: Insufficient documentation

## 2021-08-12 DIAGNOSIS — Z8679 Personal history of other diseases of the circulatory system: Secondary | ICD-10-CM

## 2021-08-12 DIAGNOSIS — Z91199 Patient's noncompliance with other medical treatment and regimen due to unspecified reason: Secondary | ICD-10-CM

## 2021-08-12 DIAGNOSIS — Z8674 Personal history of sudden cardiac arrest: Secondary | ICD-10-CM | POA: Insufficient documentation

## 2021-08-12 DIAGNOSIS — I110 Hypertensive heart disease with heart failure: Secondary | ICD-10-CM | POA: Insufficient documentation

## 2021-08-12 DIAGNOSIS — Z955 Presence of coronary angioplasty implant and graft: Secondary | ICD-10-CM | POA: Insufficient documentation

## 2021-08-12 DIAGNOSIS — Z79899 Other long term (current) drug therapy: Secondary | ICD-10-CM | POA: Insufficient documentation

## 2021-08-12 DIAGNOSIS — Z720 Tobacco use: Secondary | ICD-10-CM

## 2021-08-12 DIAGNOSIS — I25119 Atherosclerotic heart disease of native coronary artery with unspecified angina pectoris: Secondary | ICD-10-CM

## 2021-08-12 DIAGNOSIS — Z09 Encounter for follow-up examination after completed treatment for conditions other than malignant neoplasm: Secondary | ICD-10-CM | POA: Insufficient documentation

## 2021-08-12 DIAGNOSIS — I5022 Chronic systolic (congestive) heart failure: Secondary | ICD-10-CM | POA: Insufficient documentation

## 2021-08-12 DIAGNOSIS — I2510 Atherosclerotic heart disease of native coronary artery without angina pectoris: Secondary | ICD-10-CM | POA: Insufficient documentation

## 2021-08-12 LAB — BASIC METABOLIC PANEL
Anion gap: 9 (ref 5–15)
BUN: 16 mg/dL (ref 8–23)
CO2: 22 mmol/L (ref 22–32)
Calcium: 8.9 mg/dL (ref 8.9–10.3)
Chloride: 104 mmol/L (ref 98–111)
Creatinine, Ser: 1.27 mg/dL — ABNORMAL HIGH (ref 0.61–1.24)
GFR, Estimated: >60 mL/min (ref >60)
Glucose, Bld: 145 mg/dL — ABNORMAL HIGH (ref 70–99)
Potassium: 3.9 mmol/L (ref 3.5–5.1)
Sodium: 135 mmol/L (ref 135–145)

## 2021-08-12 MED ORDER — SPIRONOLACTONE 25 MG PO TABS
12.5000 mg | ORAL_TABLET | Freq: Every day | ORAL | 3 refills | Status: DC
  Filled 2021-08-12 – 2021-08-27 (×2): qty 15, 30d supply, fill #0

## 2021-08-12 NOTE — Telephone Encounter (Signed)
-----   Message from Jessica M Milford, FNP sent at 08/12/2021 11:19 AM EST ----- °Labs stable. Stop KCL supplement. °

## 2021-08-12 NOTE — Telephone Encounter (Signed)
I attempted to reach patient with results.  There was no answer and no ability to leave a message.

## 2021-08-12 NOTE — Patient Instructions (Signed)
START Spironolactone 12.5 mg, one half tab dailty   Labs today We will only contact you if something comes back abnormal or we need to make some changes. Otherwise no news is good news!  Labs needed in 7-10 days  Your physician recommends that you schedule a follow-up appointment in: 2 weeks with the HF pharmacy team and keep cardiology follow up with Dr Haroldine Laws  Do the following things EVERYDAY: Weigh yourself in the morning before breakfast. Write it down and keep it in a log. Take your medicines as prescribed Eat low salt foods--Limit salt (sodium) to 2000 mg per day.  Stay as active as you can everyday Limit all fluids for the day to less than 2 liters  At the [facility], you and your health needs are our priority. As part of our continuing mission to provide you with exceptional heart care, we have created designated Provider Care Teams. These Care Teams include your primary Cardiologist (physician) and Advanced Practice Providers (APPs- Physician Assistants and Nurse Practitioners) who all work together to provide you with the care you need, when you need it.   You may see any of the following providers on your designated Care Team at your next follow up: Dr Glori Bickers Dr Haynes Kerns, NP Lyda Jester, [place] [facility] [place], [place] Audry Riles, PharmD   Please be sure to bring in all your medications bottles to every appointment.

## 2021-08-13 ENCOUNTER — Telehealth (HOSPITAL_COMMUNITY): Payer: Self-pay | Admitting: Surgery

## 2021-08-13 NOTE — Telephone Encounter (Signed)
-----   Message from Jacklynn Ganong, [place] sent at 08/12/2021 11:19 AM EST ----- Labs stable. Stop KCL supplement.

## 2021-08-13 NOTE — Telephone Encounter (Signed)
I contacted patient and reviewed results and recommendation per [name] NP.  He is aware and agreeable.  Medication list updated in [facility].

## 2021-08-19 ENCOUNTER — Other Ambulatory Visit (HOSPITAL_COMMUNITY): Payer: Self-pay

## 2021-08-26 ENCOUNTER — Other Ambulatory Visit: Payer: Self-pay

## 2021-08-27 ENCOUNTER — Other Ambulatory Visit: Payer: Self-pay

## 2021-09-01 ENCOUNTER — Other Ambulatory Visit: Payer: Self-pay

## 2021-09-02 ENCOUNTER — Inpatient Hospital Stay (HOSPITAL_COMMUNITY): Admission: RE | Admit: 2021-09-02 | Payer: Self-pay | Source: Ambulatory Visit

## 2021-09-09 ENCOUNTER — Telehealth (HOSPITAL_COMMUNITY): Payer: Self-pay | Admitting: Licensed Clinical Social Worker

## 2021-09-09 NOTE — Telephone Encounter (Signed)
CSW called pt regarding several months of Brilinta that had been shipped to clinic and had not been picked up by patient yet. ? ?Pt reports he will have his wife come pick up so samples placed back at front desk ? ?Burna Sis, LCSW ?Clinical Social Worker ?[facility] ?Desk#: [phone] ?Cell#: [phone] ? ?

## 2021-09-14 ENCOUNTER — Other Ambulatory Visit: Payer: Self-pay

## 2021-09-15 ENCOUNTER — Other Ambulatory Visit (HOSPITAL_COMMUNITY): Payer: Self-pay

## 2021-09-15 ENCOUNTER — Telehealth (HOSPITAL_COMMUNITY): Payer: Self-pay | Admitting: Pharmacy Technician

## 2021-09-15 NOTE — Telephone Encounter (Signed)
Advanced Heart Failure Patient Advocate Encounter ? ?Called and spoke with the patient's significant other regarding Brilinta. Stressed the importance of him taking this medication and that the medication was at the clinic for pick up. She stated she has been getting the medication filled at [facility] but will be over to the clinic to pick up the medication that was delivered tomorrow. ? ?Charlann Boxer, CPhT ? ?

## 2021-09-16 ENCOUNTER — Ambulatory Visit (HOSPITAL_COMMUNITY): Payer: Self-pay

## 2021-09-17 ENCOUNTER — Other Ambulatory Visit (HOSPITAL_COMMUNITY): Payer: Self-pay

## 2021-09-17 ENCOUNTER — Ambulatory Visit (HOSPITAL_BASED_OUTPATIENT_CLINIC_OR_DEPARTMENT_OTHER)
Admission: RE | Admit: 2021-09-17 | Discharge: 2021-09-17 | Disposition: A | Discharge status: Home / Self Care | Payer: Self-pay | Source: Ambulatory Visit | Attending: Internal Medicine | Admitting: Internal Medicine

## 2021-09-17 ENCOUNTER — Ambulatory Visit (HOSPITAL_COMMUNITY)
Admission: RE | Admit: 2021-09-17 | Discharge: 2021-09-17 | Disposition: A | Discharge status: Home / Self Care | Payer: Self-pay | Source: Ambulatory Visit | Attending: Internal Medicine | Admitting: Internal Medicine

## 2021-09-17 ENCOUNTER — Encounter (HOSPITAL_COMMUNITY): Payer: Self-pay | Admitting: Internal Medicine

## 2021-09-17 VITALS — BP 160/80 | HR 71 | Wt 185.6 lb

## 2021-09-17 DIAGNOSIS — I252 Old myocardial infarction: Secondary | ICD-10-CM | POA: Insufficient documentation

## 2021-09-17 DIAGNOSIS — I151 Hypertension secondary to other renal disorders: Secondary | ICD-10-CM

## 2021-09-17 DIAGNOSIS — I25119 Atherosclerotic heart disease of native coronary artery with unspecified angina pectoris: Secondary | ICD-10-CM | POA: Insufficient documentation

## 2021-09-17 DIAGNOSIS — Z7901 Long term (current) use of anticoagulants: Secondary | ICD-10-CM | POA: Insufficient documentation

## 2021-09-17 DIAGNOSIS — N2889 Other specified disorders of kidney and ureter: Secondary | ICD-10-CM

## 2021-09-17 DIAGNOSIS — I110 Hypertensive heart disease with heart failure: Secondary | ICD-10-CM | POA: Insufficient documentation

## 2021-09-17 DIAGNOSIS — I5022 Chronic systolic (congestive) heart failure: Secondary | ICD-10-CM | POA: Insufficient documentation

## 2021-09-17 DIAGNOSIS — Z794 Long term (current) use of insulin: Secondary | ICD-10-CM | POA: Insufficient documentation

## 2021-09-17 DIAGNOSIS — Z7902 Long term (current) use of antithrombotics/antiplatelets: Secondary | ICD-10-CM | POA: Insufficient documentation

## 2021-09-17 DIAGNOSIS — I472 Ventricular tachycardia, unspecified: Secondary | ICD-10-CM | POA: Insufficient documentation

## 2021-09-17 DIAGNOSIS — Z79899 Other long term (current) drug therapy: Secondary | ICD-10-CM | POA: Insufficient documentation

## 2021-09-17 DIAGNOSIS — E782 Mixed hyperlipidemia: Secondary | ICD-10-CM

## 2021-09-17 DIAGNOSIS — R0609 Other forms of dyspnea: Secondary | ICD-10-CM

## 2021-09-17 DIAGNOSIS — Z7982 Long term (current) use of aspirin: Secondary | ICD-10-CM | POA: Insufficient documentation

## 2021-09-17 DIAGNOSIS — Z955 Presence of coronary angioplasty implant and graft: Secondary | ICD-10-CM | POA: Insufficient documentation

## 2021-09-17 DIAGNOSIS — Z7984 Long term (current) use of oral hypoglycemic drugs: Secondary | ICD-10-CM | POA: Insufficient documentation

## 2021-09-17 DIAGNOSIS — I4901 Ventricular fibrillation: Secondary | ICD-10-CM | POA: Insufficient documentation

## 2021-09-17 DIAGNOSIS — Z87891 Personal history of nicotine dependence: Secondary | ICD-10-CM | POA: Insufficient documentation

## 2021-09-17 DIAGNOSIS — I10 Essential (primary) hypertension: Secondary | ICD-10-CM

## 2021-09-17 LAB — BASIC METABOLIC PANEL
Anion gap: 8 (ref 5–15)
BUN: 15 mg/dL (ref 8–23)
CO2: 20 mmol/L — ABNORMAL LOW (ref 22–32)
Calcium: 9.3 mg/dL (ref 8.9–10.3)
Chloride: 107 mmol/L (ref 98–111)
Creatinine, Ser: 1.31 mg/dL — ABNORMAL HIGH (ref 0.61–1.24)
GFR, Estimated: >60 mL/min (ref >60)
Glucose, Bld: 118 mg/dL — ABNORMAL HIGH (ref 70–99)
Potassium: 4.1 mmol/L (ref 3.5–5.1)
Sodium: 135 mmol/L (ref 135–145)

## 2021-09-17 LAB — ECHOCARDIOGRAM COMPLETE
AR max vel: 3.36 cm2
AV Peak grad: 4.4 mmHg
Ao pk vel: 1.05 m/s
Area-P 1/2: 3.46 cm2
Calc EF: 43.4 %
S' Lateral: 4.1 cm
Single Plane A2C EF: 45.3 %
Single Plane A4C EF: 43.5 %

## 2021-09-17 LAB — BRAIN NATRIURETIC PEPTIDE: B Natriuretic Peptide: 55.2 pg/mL (ref 0.0–100.0)

## 2021-09-17 MED ORDER — SPIRONOLACTONE 25 MG PO TABS
25.0000 mg | ORAL_TABLET | Freq: Every day | ORAL | 3 refills | Status: DC
  Filled 2021-09-17 – 2021-09-21 (×2): qty 90, 90d supply, fill #0
  Filled 2022-01-04: qty 30, 30d supply, fill #1
  Filled 2022-01-04: qty 90, 90d supply, fill #1
  Filled 2022-02-23: qty 30, 30d supply, fill #2
  Filled 2022-03-31: qty 30, 30d supply, fill #3
  Filled 2022-04-29: qty 30, 30d supply, fill #4
  Filled 2022-05-25: qty 30, 30d supply, fill #5
  Filled 2022-06-29 (×2): qty 30, 30d supply, fill #0
  Filled 2022-08-02: qty 30, 30d supply, fill #1
  Filled 2022-09-08: qty 30, 30d supply, fill #2

## 2021-09-17 MED ORDER — ATORVASTATIN CALCIUM 20 MG PO TABS
20.0000 mg | ORAL_TABLET | Freq: Every day | ORAL | 3 refills | Status: DC
  Filled 2021-09-17: qty 90, 90d supply, fill #0
  Filled 2021-10-16: qty 30, 30d supply, fill #0
  Filled 2021-11-23 – 2021-12-01 (×2): qty 30, 30d supply, fill #1
  Filled 2022-01-04: qty 30, 30d supply, fill #2
  Filled 2022-02-23: qty 30, 30d supply, fill #3

## 2021-09-17 MED ORDER — NITROGLYCERIN 0.4 MG SL SUBL
0.4000 mg | SUBLINGUAL_TABLET | SUBLINGUAL | 0 refills | Status: DC | PRN
  Filled 2021-09-17: qty 25, 7d supply, fill #0

## 2021-09-17 MED ORDER — DAPAGLIFLOZIN PROPANEDIOL 10 MG PO TABS
10.0000 mg | ORAL_TABLET | Freq: Every day | ORAL | 3 refills | Status: DC
  Filled 2021-09-17: qty 30, 30d supply, fill #0

## 2021-09-17 MED ORDER — CARVEDILOL 6.25 MG PO TABS
6.2500 mg | ORAL_TABLET | Freq: Two times a day (BID) | ORAL | 3 refills | Status: DC
  Filled 2021-09-17: qty 180, 90d supply, fill #0
  Filled 2021-10-16: qty 60, 30d supply, fill #0
  Filled 2022-01-04: qty 60, 30d supply, fill #1
  Filled 2022-03-16: qty 60, 30d supply, fill #2
  Filled 2022-04-19: qty 60, 30d supply, fill #3
  Filled 2022-05-25: qty 60, 30d supply, fill #4
  Filled 2022-07-06: qty 60, 30d supply, fill #5
  Filled 2022-08-02: qty 60, 30d supply, fill #6
  Filled 2022-08-27: qty 60, 30d supply, fill #7

## 2021-09-17 MED ORDER — AMLODIPINE BESYLATE 10 MG PO TABS
10.0000 mg | ORAL_TABLET | Freq: Every day | ORAL | 3 refills | Status: DC
  Filled 2021-09-17: qty 90, 90d supply, fill #0
  Filled 2021-10-16: qty 30, 30d supply, fill #0
  Filled 2021-11-23 – 2021-12-01 (×2): qty 30, 30d supply, fill #1
  Filled 2022-01-04: qty 30, 30d supply, fill #2
  Filled 2022-02-23: qty 30, 30d supply, fill #3
  Filled 2022-03-31: qty 30, 30d supply, fill #4
  Filled 2022-04-29: qty 30, 30d supply, fill #5
  Filled 2022-05-28: qty 30, 30d supply, fill #6
  Filled 2022-06-29 (×2): qty 30, 30d supply, fill #0
  Filled 2022-08-02: qty 30, 30d supply, fill #1
  Filled 2022-09-08: qty 30, 30d supply, fill #2

## 2021-09-17 NOTE — Progress Notes (Signed)
?Heart and Vascular Care Navigation ? ?09/17/2021 ? ?Francee Piccolo ?06/12/1956 ?[id number] ? ?Reason for Referral: CSW intern completed SDOH screen during clinic visit and identified patient concerns with paying for utilities right now. ?  ?Engaged with patient face to face for initial visit for Heart and Vascular Care Coordination. ?                                                                                                  ?Assessment:     CSW met with pt and pt wife during clinic visit to follow up regarding concerns with paying for utility bill.   Pt wife reports they are $1000 behind on their electric bill and they have exhausted their options for a payment plan so if they don't pay the full amount by 4/5 they will turn off their electricity.  State they have spoken to urban [employer] and [employer] and there are no funds available at either location.   ? ?Pt wife reports she can pay $500 of the outstanding bill but can't come up with the rest.  CSW discussed referral to the patient care fund for assistance- informed of required documents for referral- she will gather and bring back to me ASAP.  Reports no concerns with being able to pay for future costs now that pt is able to work some again.                             ? ?HRT/VAS Care Coordination   ? ? Patients Home Cardiology Office Heart Failure Clinic  ? Outpatient Care Team Social Worker  ? Social Worker Name: Tammy Sours, [facility], [phone]  ? Living arrangements for the past 2 months Single Family Home  ? Lives with: Spouse  ? Patient Current Insurance Coverage Self-Pay  ? Does Patient Have Prescription Coverage? No  ? Patient Prescription Assistance [place]; Patient Assistance Programs  ? Home Assistive Devices/Equipment None  ? ?  ? ? ?Social History:                                                                             ?SDOH Screenings  ? ?Alcohol Screen: Not on file  ?Depression (PHQ2-9): Low Risk   ?  PHQ-2 Score: 3  ?Financial Resource Strain: Low Risk   ? Difficulty of Paying Living Expenses: Not very hard  ?Food Insecurity: No Food Insecurity  ? Worried About Charity fundraiser in the Last Year: Never true  ? Ran Out of Food in the Last Year: Never true  ?Housing: Medium Risk  ? Last Housing Risk Score: 1  ?Physical Activity: Not on file  ?Social Connections: Not on file  ?Stress: Not on file  ?Tobacco Use: Medium Risk  ?  Smoking Tobacco Use: Former  ? Smokeless Tobacco Use: Never  ? Passive Exposure: Not on file  ?Transportation Needs: No Transportation Needs  ? Lack of Transportation (Medical): No  ? Lack of Transportation (Non-Medical): No  ? ? ?SDOH Interventions: ?Financial Resources:  Financial Strain Interventions: Intervention Not Indicated ?Wife works full time and pt working part time under the table  ?Food Insecurity:  Food Insecurity Interventions: Intervention Not Indicated  ?Housing Insecurity:  Housing Interventions: Intervention Not Indicated  ?Transportation:   Transportation Interventions: Intervention Not Indicated  ? ?Follow-up plan:   ? ?Pt wife to bring in required documents to CSW then we will call [employer] together to inform of pending payment so they can delay turn off until the check comes. ? ?Will continue to follow and assist as needed ? ?Jorge Ny, LCSW ?Clinical Social Worker ?[facility] ?Desk#: [phone] ?Cell#: [phone] ? ? ? ?

## 2021-09-17 NOTE — Addendum Note (Signed)
Encounter addended by: Burna Sis, LCSW on: 09/17/2021 2:11 PM ? Actions taken: Flowsheet accepted, Clinical Note Signed

## 2021-09-17 NOTE — Progress Notes (Signed)
? ?ADVANCED HF CLINIC NOTE ? ? ?Primary Care: Kerin Perna, NP ?HF Cardiologist: Dr. Haroldine Laws ? ?HPI: ?Logan Brewer is a 66 y.o. male with history of uncontrolled HTN and heavy, longstanding tobacco use, systolic heart failure, CAD, and VT/VF arrest. ?  ?Had VT/VF arrest in 9/22. ECG with anterior STEMI. Cath 99% ostial to proximal LAD treated with PCI/DES. Echo with EF up to 40%.  ? ?Readmitted 10/22 with CP and SOB. Repeat echo EF 40-45%, Hstrop negative. Found to have AKI and GDMT held at discharge.  ? ?He no showed and cancelled post hospital follow up x 3. ? ?Last seen 08/12/21. Entresto  added back. ? ?Today he returns for HF follow up. Overall feeling fine. SOB with mild exertion. Also SOB with talking. Says it is relatively stable. Not taking lasix. No edema. Denies PND/Orthopnea. Appetite ok. No fever or chills. He has not been weighing at home. Taking all medications but did not take meds prior to visit. He is working full time in [employer].  ? ?REDs 35% ? ? ?Cardiac Studies:  ?- Ltd Echo (10/22): EF 40-45%, moderate LV dysfunction, mild LVH, RV ok, ?- Echo 03/12/21 EF 20-25%, akinesis of anteroseptal wall and apex, RV okay, no MR, IVC normal in size ? ?- R/LHC 9/22:  severe single vessel CAD, 95-99% ostial LAD s/p PCI + DES. EF 20-25%. RV normal. No MR, moderate pulm hypertension  ?. ? ?Past Medical History:  ?Diagnosis Date  ? Hydrocele in adult   ? Hypertension   ? Inguinal hernia   ? Ischemic cardiomyopathy   ? Smoker 03/16/2021  ? smokes 1/2 ppd PTA per hx  ? STEMI involving left anterior descending coronary artery ([place]) 03/12/2021  ? ?Current Outpatient Medications  ?Medication Sig Dispense Refill  ? amLODipine (NORVASC) 10 MG tablet Take 1 tablet (10 mg total) by mouth daily. 90 tablet 1  ? aspirin EC 81 MG EC tablet Take 1 tablet (81 mg total) by mouth daily. Swallow whole. 30 tablet 0  ? atorvastatin (LIPITOR) 20 MG tablet Take 1 tablet (20 mg total) by mouth daily. 90 tablet 3  ?  carvedilol (COREG) 6.25 MG tablet Take 1 tablet (6.25 mg total) by mouth 2 (two) times daily with a meal. 180 tablet 1  ? nitroGLYCERIN (NITROSTAT) 0.4 MG SL tablet Place 1 tablet (0.4 mg total) under the tongue every 5 (five) minutes as needed for chest pain. 90 tablet 0  ? sacubitril-valsartan (ENTRESTO) 24-26 MG Take 1 tablet by mouth 2 (two) times daily. 180 tablet 3  ? spironolactone (ALDACTONE) 25 MG tablet Take 25 mg by mouth daily.    ? ticagrelor (BRILINTA) 90 MG TABS tablet Take 1 tablet (90 mg total) by mouth 2 (two) times daily. 180 tablet 1  ? ?No current facility-administered medications for this encounter.  ? ?No Known Allergies ? ?Social History  ? ?Socioeconomic History  ? Marital status: Single  ?  Spouse name: Not on file  ? Number of children: Not on file  ? Years of education: Not on file  ? Highest education level: Not on file  ?Occupational History  ? Not on file  ?Tobacco Use  ? Smoking status: Former  ?  Packs/day: 0.50  ?  Types: Cigarettes  ? Smokeless tobacco: Never  ?Vaping Use  ? Vaping Use: Never used  ?Substance and Sexual Activity  ? Alcohol use: No  ? Drug use: Yes  ?  Types: Marijuana  ?  Comment: occasionally   ?  Sexual activity: Not on file  ?Other Topics Concern  ? Not on file  ?Social History Narrative  ? ** Merged History Encounter **  ?    ? ?Social Determinants of Health  ? ?Financial Resource Strain: Medium Risk  ? Difficulty of Paying Living Expenses: Somewhat hard  ?Food Insecurity: Food Insecurity Present  ? Worried About Charity fundraiser in the Last Year: Never true  ? Ran Out of Food in the Last Year: Sometimes true  ?Transportation Needs: No Transportation Needs  ? Lack of Transportation (Medical): No  ? Lack of Transportation (Non-Medical): No  ?Physical Activity: Not on file  ?Stress: Not on file  ?Social Connections: Not on file  ?Intimate Partner Violence: Not on file  ? ?Family History  ?Problem Relation Age of Onset  ? Heart disease Neg Hx   ? ?BP (!) 160/80    Pulse 71   Wt 84.2 kg (185 lb 9.6 oz)   SpO2 97%   BMI 29.07 kg/m?  ? ?Wt Readings from Last 3 Encounters:  ?09/17/21 84.2 kg (185 lb 9.6 oz)  ?08/12/21 85.3 kg (188 lb)  ?07/31/21 84.5 kg (186 lb 3.2 oz)  ? ?PHYSICAL EXAM: ?General:  Well appearing. No resp difficulty ?HEENT: normal ?Neck: supple. no JVD. Carotids 2+ bilat; no bruits. No lymphadenopathy or thryomegaly appreciated. ?Cor: PMI nondisplaced. Regular rate & rhythm. No rubs, gallops or murmurs. ?Lungs: clear ?Abdomen: soft, nontender, nondistended. No hepatosplenomegaly. No bruits or masses. Good bowel sounds. ?Extremities: no cyanosis, clubbing, rash, tr edema ?Neuro: alert & orientedx3, cranial nerves grossly intact. moves all 4 extremities w/o difficulty. Affect pleasant ? ? ?ASSESSMENT & PLAN: ? ?1. Chronic Systolic Heart Failure ?- Secondary to anterior ST elevation MI/OOH arrest in 9/22 ?- Initial echo EF 20-25% with akinesis of anteroseptal wall and apex ?- Bedside echo EF 35-40% 03/14/21.  ?- Repeat Limited echo 03/16/21 w/ improved EF, 40-45%   ?- Echo today 09/17/21 40%.  ?- Stable NYHA III today ?- Reds Clip 35%. Add farxiga 10 mg daily. (Will need med assistance) ?- Continue Entresto 24/26 mg bid. ?- Continue carvedilol 6.25 bid ?- Continue spironolactone 25 gm daily.  ?- Check BMET, BNP ?- Will need CPX testing to further evaluate HF vs COPD ? ?2. CAD ?- s/p PCI/DES to LAD as above ?- Continue DAPT with aspirin and ticagrelor. ?- No s/s angina ?- Continue statin and ? blocker. ?  ?3. H/o VT/VF arrest: ?- 2/2 #2 ?- Off amio  ?- EF 35-40%. Does not qualify for ICD ?  ?4. Tobacco use: ?- Quit completely ? ? ?Jolaine Artist MD ? ? ?

## 2021-09-17 NOTE — Progress Notes (Signed)
ReDS Vest / Clip - 09/17/21 1000   ? ?  ? ReDS Vest / Clip  ? Station Marker D   ? Ruler Value 32   ? ReDS Value Range Low volume   ? ReDS Actual Value 35   ? ?  ?  ? ?  ? ? ?

## 2021-09-17 NOTE — Addendum Note (Signed)
Encounter addended by: Johnnette Gourd, Student-Social Work on: 09/17/2021 12:24 PM ? Actions taken: Clinical Note Signed

## 2021-09-17 NOTE — Progress Notes (Signed)
Student Intern completed SDOH screening with Pt. Identified concerns with paying for utilities, see LCSW notes for full assessment.  ? ?Johnnette Gourd, MSW student intern ?[facility] ?[place] ?[phone] ? ? ?

## 2021-09-17 NOTE — Patient Instructions (Signed)
Start Farxiga 10 mg daily. ? ?Labs done today, your results will be available in MyChart, we will contact you for abnormal readings. ? ? ?Your physician has recommended that you have a cardiopulmonary stress test (CPX). CPX testing is a non-invasive measurement of heart and lung function. It replaces a traditional treadmill stress test. This type of test provides a tremendous amount of information that relates not only to your present condition but also for future outcomes. This test combines measurements of you ventilation, respiratory gas exchange in the lungs, electrocardiogram (EKG), blood pressure and physical response before, during, and following an exercise protocol. ? ? ?Your physician recommends that you schedule a follow-up appointment in: 6 months (September 2023)  ** please call the office in July to arrange your follow up appointment ** ? ?If you have any questions or concerns before your next appointment please send [place] a message through [place] or call our office at [phone].   ? ?TO LEAVE A MESSAGE FOR THE NURSE SELECT OPTION 2, PLEASE LEAVE A MESSAGE INCLUDING: ?YOUR NAME ?DATE OF BIRTH ?CALL BACK NUMBER ?REASON FOR CALL**this is important as we prioritize the call backs ? ?YOU WILL RECEIVE A CALL BACK THE SAME DAY AS LONG AS YOU CALL BEFORE 4:00 PM ? ?At the [facility], you and your health needs are our priority. As part of our continuing mission to provide you with exceptional heart care, we have created designated Provider Care Teams. These Care Teams include your primary Cardiologist (physician) and Advanced Practice Providers (APPs- Physician Assistants and Nurse Practitioners) who all work together to provide you with the care you need, when you need it.  ? ?You may see any of the following providers on your designated Care Team at your next follow up: ?Dr Arvilla Meres ?Dr Marca Ancona ?Tonye Becket, NP ?Robbie Lis, PA ?Jessica Milford,NP ?Anna Genre,  PA ?Karle Plumber, PharmD ? ? ?Please be sure to bring in all your medications bottles to every appointment.  ? ? ?

## 2021-09-18 ENCOUNTER — Telehealth (HOSPITAL_COMMUNITY): Payer: Self-pay | Admitting: Licensed Clinical Social Worker

## 2021-09-18 NOTE — Telephone Encounter (Signed)
CSW able to assist with $500 payment to [employer] to prevent turn off.  Patient set up on payment plan and feels confident they can pay off remaining balance. ? ?No further needs at this time ? ?Jorge Ny, LCSW ?Clinical Social Worker ?[facility] ?Desk#: [phone] ?Cell#: [phone] ? ?

## 2021-09-21 ENCOUNTER — Other Ambulatory Visit: Payer: Self-pay

## 2021-09-22 ENCOUNTER — Ambulatory Visit (HOSPITAL_COMMUNITY): Payer: Self-pay | Attending: Cardiology

## 2021-09-22 ENCOUNTER — Other Ambulatory Visit: Payer: Self-pay

## 2021-09-22 DIAGNOSIS — Z79899 Other long term (current) drug therapy: Secondary | ICD-10-CM | POA: Insufficient documentation

## 2021-09-22 DIAGNOSIS — I509 Heart failure, unspecified: Secondary | ICD-10-CM | POA: Insufficient documentation

## 2021-09-22 DIAGNOSIS — R0609 Other forms of dyspnea: Secondary | ICD-10-CM

## 2021-09-22 DIAGNOSIS — Z7982 Long term (current) use of aspirin: Secondary | ICD-10-CM | POA: Insufficient documentation

## 2021-09-22 DIAGNOSIS — I110 Hypertensive heart disease with heart failure: Secondary | ICD-10-CM | POA: Insufficient documentation

## 2021-09-22 DIAGNOSIS — I252 Old myocardial infarction: Secondary | ICD-10-CM | POA: Insufficient documentation

## 2021-09-22 DIAGNOSIS — Z7984 Long term (current) use of oral hypoglycemic drugs: Secondary | ICD-10-CM | POA: Insufficient documentation

## 2021-09-22 DIAGNOSIS — Z955 Presence of coronary angioplasty implant and graft: Secondary | ICD-10-CM | POA: Insufficient documentation

## 2021-10-16 ENCOUNTER — Other Ambulatory Visit (HOSPITAL_COMMUNITY): Payer: Self-pay

## 2021-10-16 ENCOUNTER — Other Ambulatory Visit: Payer: Self-pay

## 2021-11-03 ENCOUNTER — Telehealth (HOSPITAL_COMMUNITY): Payer: Self-pay | Admitting: *Deleted

## 2021-11-03 ENCOUNTER — Ambulatory Visit (INDEPENDENT_AMBULATORY_CARE_PROVIDER_SITE_OTHER): Payer: Self-pay | Admitting: Primary Care

## 2021-11-03 ENCOUNTER — Encounter (INDEPENDENT_AMBULATORY_CARE_PROVIDER_SITE_OTHER): Payer: Self-pay | Admitting: Primary Care

## 2021-11-03 VITALS — BP 132/69 | HR 65 | Temp 98.4°F | Ht 67.0 in | Wt 179.0 lb

## 2021-11-03 DIAGNOSIS — N5201 Erectile dysfunction due to arterial insufficiency: Secondary | ICD-10-CM

## 2021-11-03 DIAGNOSIS — R600 Localized edema: Secondary | ICD-10-CM

## 2021-11-03 NOTE — Telephone Encounter (Signed)
Pt left vm to schedule office visit. I called pt back no answer and no vm.  ?

## 2021-11-03 NOTE — Progress Notes (Signed)
?[place] ? ?Logan Brewer, is a 66 y.o. male ? ?[id number] ? ?[id number] ? ?DOB - Feb 02, 1956 ? ?Chief Complaint  ?Patient presents with  ? Foot Swelling  ?  Left foot   ?    ? ?Subjective:  ? ?Logan Brewer is a 66 y.o. male here today for concerns with his left foot swelling for about a week.  He denies twisting his ankle dropping anything on it following no trauma associated. Today on examination of his left foot there is no swelling present. Patient has No headache, No chest pain, No abdominal pain - No Nausea, No new weakness tingling or numbness, No Cough - shortness of breath ? ?No problems updated. ? ?No Known Allergies ? ?Past Medical History:  ?Diagnosis Date  ? Hydrocele in adult   ? Hypertension   ? Inguinal hernia   ? Ischemic cardiomyopathy   ? Smoker 03/16/2021  ? smokes 1/2 ppd PTA per hx  ? STEMI involving left anterior descending coronary artery ([place]) 03/12/2021  ? ? ?Current Outpatient Medications on File Prior to Visit  ?Medication Sig Dispense Refill  ? amLODipine (NORVASC) 10 MG tablet Take 1 tablet (10 mg total) by mouth daily. 90 tablet 3  ? aspirin EC 81 MG EC tablet Take 1 tablet (81 mg total) by mouth daily. Swallow whole. 30 tablet 0  ? atorvastatin (LIPITOR) 20 MG tablet Take 1 tablet (20 mg total) by mouth daily. 90 tablet 3  ? carvedilol (COREG) 6.25 MG tablet Take 1 tablet (6.25 mg total) by mouth 2 (two) times daily with a meal. 180 tablet 3  ? dapagliflozin propanediol (FARXIGA) 10 MG TABS tablet Take 1 tablet (10 mg total) by mouth daily before breakfast. 90 tablet 3  ? nitroGLYCERIN (NITROSTAT) 0.4 MG SL tablet Place 1 tablet (0.4 mg total) under the tongue every 5 (five) minutes as needed for chest pain. 75 tablet 0  ? sacubitril-valsartan (ENTRESTO) 24-26 MG Take 1 tablet by mouth 2 (two) times daily. 180 tablet 3  ? spironolactone (ALDACTONE) 25 MG tablet Take 1 tablet (25 mg total) by mouth daily. 90 tablet 3  ? ticagrelor (BRILINTA) 90 MG TABS  tablet Take 1 tablet (90 mg total) by mouth 2 (two) times daily. 180 tablet 1  ? ?No current facility-administered medications on file prior to visit.  ? ? ?Objective:  ? ?Vitals:  ? 11/03/21 1106  ?BP: 132/69  ?Pulse: 65  ?Temp: 98.4 ?F (36.9 ?C)  ?TempSrc: Oral  ?SpO2: 97%  ?Weight: 179 lb (81.2 kg)  ?Height: 5\' 7"  (1.702 m)  ? ? ?Exam ?General appearance : Awake, alert, not in any distress. Speech Clear. Not toxic looking ?HEENT: Atraumatic and Normocephalic, pupils equally reactive to light and accomodation ?Neck: Supple, no JVD. No cervical lymphadenopathy.  ?Chest: Good air entry bilaterally, no added sounds  ?CVS: S1 S2 regular, no murmurs.  ?Abdomen: Bowel sounds present, Non tender and not distended with no gaurding, rigidity or rebound. ?Extremities: B/L Lower Ext shows no edema, both legs are warm to touch ?Neurology: Awake alert, and oriented X 3, Non focal ?Skin: No Rash ? ?Data Review ?Lab Results  ?Component Value Date  ? HGBA1C 6.3 (A) 06/05/2021  ? HGBA1C 6.4 (H) 03/12/2021  ? ? ?Assessment & Plan  ?Jennis was seen today for foot swelling. ? ?Diagnoses and all orders for this visit: ? ?Localized edema ?No swelling present however edema can be present lower extremities later in the day.  Explained this can be dependent  edema.  Encouraged to monitor sodium intake.  May also benefit from purchasing TED hose/compression hose if swelling becomes reoccurring ?  ?Erectile dysfunction due to arterial insufficiency ?Defer to Bensimhon, Shaune Pascal, MD cardiology  ? ?There are no diagnoses linked to this encounter. ? ? ?Patient have been counseled extensively about nutrition and exercise. Other issues discussed during this visit include: low cholesterol diet, weight control and daily exercise, foot care, annual eye examinations at Ophthalmology, importance of adherence with medications and regular follow-up. We also discussed long term complications of uncontrolled diabetes and hypertension.  ? ?No follow-ups on  file. ? ?The patient was given clear instructions to go to ER or return to medical center if symptoms don't improve, worsen or new problems develop. The patient verbalized understanding. The patient was told to call to get lab results if they haven't heard anything in the next week.  ? ?This note has been created with Surveyor, quantity. Any transcriptional errors are unintentional.  ? ?Kerin Perna, NP ?11/03/2021, 11:15 AM  ?

## 2021-11-23 ENCOUNTER — Telehealth (HOSPITAL_COMMUNITY): Payer: Self-pay | Admitting: Pharmacy Technician

## 2021-11-23 ENCOUNTER — Other Ambulatory Visit: Payer: Self-pay

## 2021-11-23 NOTE — Telephone Encounter (Signed)
Advanced Heart Failure Patient Advocate Encounter  Patient's care giver called and left a message asking for help with a medication cost. Not sure which one. Called and left a message requesting a return call.  Archer Asa, CPhT

## 2021-11-27 ENCOUNTER — Other Ambulatory Visit: Payer: Self-pay

## 2021-12-01 ENCOUNTER — Other Ambulatory Visit (HOSPITAL_COMMUNITY): Payer: Self-pay

## 2021-12-03 ENCOUNTER — Other Ambulatory Visit: Payer: Self-pay

## 2021-12-15 ENCOUNTER — Telehealth (HOSPITAL_COMMUNITY): Payer: Self-pay | Admitting: Vascular Surgery

## 2021-12-15 ENCOUNTER — Ambulatory Visit (INDEPENDENT_AMBULATORY_CARE_PROVIDER_SITE_OTHER): Payer: Self-pay | Admitting: Primary Care

## 2021-12-15 ENCOUNTER — Encounter (INDEPENDENT_AMBULATORY_CARE_PROVIDER_SITE_OTHER): Payer: Self-pay | Admitting: Primary Care

## 2021-12-15 VITALS — BP 160/80 | HR 64 | Temp 98.4°F | Ht 67.0 in | Wt 187.4 lb

## 2021-12-15 DIAGNOSIS — R7303 Prediabetes: Secondary | ICD-10-CM

## 2021-12-15 DIAGNOSIS — R600 Localized edema: Secondary | ICD-10-CM

## 2021-12-15 LAB — POCT GLYCOSYLATED HEMOGLOBIN (HGB A1C): Hemoglobin A1C: 6.2 % — AB (ref 4.0–5.6)

## 2021-12-16 NOTE — Telephone Encounter (Signed)
Called pt to get more information. No answer.

## 2021-12-24 ENCOUNTER — Encounter (INDEPENDENT_AMBULATORY_CARE_PROVIDER_SITE_OTHER): Payer: Self-pay | Admitting: Primary Care

## 2021-12-24 NOTE — Progress Notes (Signed)
[facility]  Logan Brewer, is a 66 y.o. male  [id number]  [id number]  DOB - 06/09/1956  Chief Complaint  Patient presents with   Diabetes   Edema    Bilateral but left is worse       Subjective:   Logan Brewer is a 66 y.o. male here today for a follow up visit  prediabetes .Denies polyuria, polydipsia or vision changes.  He does complain of swelling bilateral and the left is worst than the right. Patient has No headache, No chest pain, No abdominal pain - No Nausea, No new weakness tingling or numbness, No Cough - shortness of breath  No problems updated.  No Known Allergies  Past Medical History:  Diagnosis Date   Hydrocele in adult    Hypertension    Inguinal hernia    Ischemic cardiomyopathy    Smoker 03/16/2021   smokes 1/2 ppd PTA per hx   STEMI involving left anterior descending coronary artery ([facility]) 03/12/2021    Current Outpatient Medications on File Prior to Visit  Medication Sig Dispense Refill   amLODipine (NORVASC) 10 MG tablet Take 1 tablet (10 mg total) by mouth daily. 90 tablet 3   aspirin EC 81 MG EC tablet Take 1 tablet (81 mg total) by mouth daily. Swallow whole. 30 tablet 0   atorvastatin (LIPITOR) 20 MG tablet Take 1 tablet (20 mg total) by mouth daily. 90 tablet 3   carvedilol (COREG) 6.25 MG tablet Take 1 tablet (6.25 mg total) by mouth 2 (two) times daily with a meal. 180 tablet 3   dapagliflozin propanediol (FARXIGA) 10 MG TABS tablet Take 1 tablet (10 mg total) by mouth daily before breakfast. 90 tablet 3   nitroGLYCERIN (NITROSTAT) 0.4 MG SL tablet Place 1 tablet (0.4 mg total) under the tongue every 5 (five) minutes as needed for chest pain. 75 tablet 0   sacubitril-valsartan (ENTRESTO) 24-26 MG Take 1 tablet by mouth 2 (two) times daily. 180 tablet 3   spironolactone (ALDACTONE) 25 MG tablet Take 1 tablet (25 mg total) by mouth daily. 90 tablet 3   ticagrelor (BRILINTA) 90 MG TABS tablet Take 1 tablet (90 mg total)  by mouth 2 (two) times daily. 180 tablet 1   No current facility-administered medications on file prior to visit.    Objective:   Vitals:   12/15/21 1054  BP: (!) 160/80  Pulse: 64  Temp: 98.4 F (36.9 C)  TempSrc: Oral  SpO2: 97%  Weight: 187 lb 6.4 oz (85 kg)  Height: 5\' 7"  (1.702 m)    Exam General appearance : Awake, alert, not in any distress. Speech Clear. Not toxic looking HEENT: Atraumatic and Normocephalic, pupils equally reactive to light and accomodation Neck: Supple, no JVD. No cervical lymphadenopathy.  Chest: Good air entry bilaterally, no added sounds  CVS: S1 S2 regular, no murmurs.  Abdomen: Bowel sounds present, Non tender and not distended with no gaurding, rigidity or rebound. Extremities: B/L Lower Ext shows no edema, both legs are warm to touch Neurology: Awake alert, and oriented X 3, CN II-XII intact, Non focal Skin: No Rash  Data Review Lab Results  Component Value Date   HGBA1C 6.2 (A) 12/15/2021   HGBA1C 6.3 (A) 06/05/2021   HGBA1C 6.4 (H) 03/12/2021    Assessment & Plan   1. Prediabetes - HgB A1c 6.2 Well controlled on dapagliflozin 10mg  daily . Therapeutic goals for glycemic control is meet . Less than 6.5 hemoglobin A1c.  Continue to monitoring foods  that are high in carbohydrates are the following rice, potatoes, breads, sugars, and pastas.  Reduction in the intake (eating) will assist in lowering your blood sugars.   2. Localized edema Bilateral followed by cardiology for  Coronary artery disease involving native coronary artery of native heart with angina pectoris . Reviewed medication on spirolactone. Discussed ted hose to improve circulation and decrease edema but schedule appt with Dr. Gala Romney note states follow-up appointment in: 6 months (September 2023)   Patient have been counseled extensively about nutrition and exercise. Other issues discussed during this visit include: low cholesterol diet, weight control and daily exercise,  foot care, annual eye examinations at Ophthalmology, importance of adherence with medications and regular follow-up. We also discussed long term complications of uncontrolled diabetes and hypertension.   No follow-ups on file.  The patient was given clear instructions to go to ER or return to medical center if symptoms don't improve, worsen or new problems develop. The patient verbalized understanding. The patient was told to call to get lab results if they haven't heard anything in the next week.   This note has been created with Education officer, environmental. Any transcriptional errors are unintentional.   Grayce Sessions, NP 12/24/2021, 4:10 PM

## 2022-01-04 ENCOUNTER — Other Ambulatory Visit (HOSPITAL_COMMUNITY): Payer: Self-pay

## 2022-01-08 ENCOUNTER — Other Ambulatory Visit: Payer: Self-pay

## 2022-02-16 ENCOUNTER — Other Ambulatory Visit (HOSPITAL_COMMUNITY): Payer: Self-pay

## 2022-02-16 ENCOUNTER — Telehealth (HOSPITAL_COMMUNITY): Payer: Self-pay | Admitting: Pharmacy Technician

## 2022-02-16 NOTE — Telephone Encounter (Signed)
Advanced Heart Failure Patient Advocate Encounter  Spoke to patient regarding re-enrollment of Sherryll Burger, Brilinta assistance with [employer], [employer]. Attempted to send applications via docusign, email address provided is not valid. Attempted to call patient's wife, no vm set up.  Will try again.

## 2022-02-23 ENCOUNTER — Other Ambulatory Visit (HOSPITAL_COMMUNITY): Payer: Self-pay

## 2022-03-01 ENCOUNTER — Emergency Department (HOSPITAL_COMMUNITY): Payer: Self-pay

## 2022-03-01 ENCOUNTER — Other Ambulatory Visit: Payer: Self-pay

## 2022-03-01 ENCOUNTER — Encounter (HOSPITAL_COMMUNITY): Payer: Self-pay

## 2022-03-01 ENCOUNTER — Inpatient Hospital Stay (HOSPITAL_COMMUNITY)
Admission: EM | Admit: 2022-03-01 | Discharge: 2022-03-03 | DRG: 251 | Disposition: A | Discharge status: Home / Self Care | Payer: Self-pay | Attending: Internal Medicine | Admitting: Internal Medicine

## 2022-03-01 DIAGNOSIS — T82855A Stenosis of coronary artery stent, initial encounter: Secondary | ICD-10-CM | POA: Diagnosis present

## 2022-03-01 DIAGNOSIS — I5022 Chronic systolic (congestive) heart failure: Secondary | ICD-10-CM | POA: Diagnosis present

## 2022-03-01 DIAGNOSIS — I10 Essential (primary) hypertension: Secondary | ICD-10-CM | POA: Diagnosis present

## 2022-03-01 DIAGNOSIS — N2889 Other specified disorders of kidney and ureter: Secondary | ICD-10-CM

## 2022-03-01 DIAGNOSIS — F17210 Nicotine dependence, cigarettes, uncomplicated: Secondary | ICD-10-CM | POA: Diagnosis present

## 2022-03-01 DIAGNOSIS — R7303 Prediabetes: Secondary | ICD-10-CM | POA: Diagnosis present

## 2022-03-01 DIAGNOSIS — Y831 Surgical operation with implant of artificial internal device as the cause of abnormal reaction of the patient, or of later complication, without mention of misadventure at the time of the procedure: Secondary | ICD-10-CM | POA: Diagnosis present

## 2022-03-01 DIAGNOSIS — Z7982 Long term (current) use of aspirin: Secondary | ICD-10-CM

## 2022-03-01 DIAGNOSIS — Z9861 Coronary angioplasty status: Secondary | ICD-10-CM

## 2022-03-01 DIAGNOSIS — Z79899 Other long term (current) drug therapy: Secondary | ICD-10-CM

## 2022-03-01 DIAGNOSIS — I25119 Atherosclerotic heart disease of native coronary artery with unspecified angina pectoris: Secondary | ICD-10-CM | POA: Diagnosis present

## 2022-03-01 DIAGNOSIS — R079 Chest pain, unspecified: Secondary | ICD-10-CM | POA: Diagnosis present

## 2022-03-01 DIAGNOSIS — I272 Pulmonary hypertension, unspecified: Secondary | ICD-10-CM | POA: Diagnosis present

## 2022-03-01 DIAGNOSIS — E782 Mixed hyperlipidemia: Secondary | ICD-10-CM | POA: Diagnosis present

## 2022-03-01 DIAGNOSIS — N179 Acute kidney failure, unspecified: Secondary | ICD-10-CM | POA: Diagnosis present

## 2022-03-01 DIAGNOSIS — E876 Hypokalemia: Secondary | ICD-10-CM | POA: Diagnosis present

## 2022-03-01 DIAGNOSIS — R0609 Other forms of dyspnea: Secondary | ICD-10-CM | POA: Diagnosis present

## 2022-03-01 DIAGNOSIS — I110 Hypertensive heart disease with heart failure: Secondary | ICD-10-CM | POA: Diagnosis present

## 2022-03-01 DIAGNOSIS — I469 Cardiac arrest, cause unspecified: Secondary | ICD-10-CM | POA: Diagnosis present

## 2022-03-01 DIAGNOSIS — Z91148 Patient's other noncompliance with medication regimen for other reason: Secondary | ICD-10-CM

## 2022-03-01 DIAGNOSIS — I214 Non-ST elevation (NSTEMI) myocardial infarction: Principal | ICD-10-CM | POA: Diagnosis present

## 2022-03-01 DIAGNOSIS — I252 Old myocardial infarction: Secondary | ICD-10-CM

## 2022-03-01 DIAGNOSIS — I5042 Chronic combined systolic (congestive) and diastolic (congestive) heart failure: Secondary | ICD-10-CM | POA: Diagnosis present

## 2022-03-01 DIAGNOSIS — R778 Other specified abnormalities of plasma proteins: Principal | ICD-10-CM

## 2022-03-01 DIAGNOSIS — I25110 Atherosclerotic heart disease of native coronary artery with unstable angina pectoris: Secondary | ICD-10-CM | POA: Diagnosis present

## 2022-03-01 LAB — CBC
HCT: 41 % (ref 39.0–52.0)
Hemoglobin: 13.9 g/dL (ref 13.0–17.0)
MCH: 32.6 pg (ref 26.0–34.0)
MCHC: 33.9 g/dL (ref 30.0–36.0)
MCV: 96.2 fL (ref 80.0–100.0)
Platelets: 281 10*3/uL (ref 150–400)
RBC: 4.26 MIL/uL (ref 4.22–5.81)
RDW: 13.2 % (ref 11.5–15.5)
WBC: 7.7 10*3/uL (ref 4.0–10.5)
nRBC: 0 % (ref 0.0–0.2)

## 2022-03-01 LAB — BASIC METABOLIC PANEL
Anion gap: 10 (ref 5–15)
BUN: 15 mg/dL (ref 8–23)
CO2: 24 mmol/L (ref 22–32)
Calcium: 9.3 mg/dL (ref 8.9–10.3)
Chloride: 105 mmol/L (ref 98–111)
Creatinine, Ser: 1.31 mg/dL — ABNORMAL HIGH (ref 0.61–1.24)
GFR, Estimated: >60 mL/min (ref >60)
Glucose, Bld: 101 mg/dL — ABNORMAL HIGH (ref 70–99)
Potassium: 4.3 mmol/L (ref 3.5–5.1)
Sodium: 139 mmol/L (ref 135–145)

## 2022-03-01 LAB — TROPONIN I (HIGH SENSITIVITY): Troponin I (High Sensitivity): 3917 ng/L (ref <18)

## 2022-03-01 LAB — CBG MONITORING, ED: Glucose-Capillary: 88 mg/dL (ref 70–99)

## 2022-03-01 LAB — GLUCOSE, CAPILLARY: Glucose-Capillary: 84 mg/dL (ref 70–99)

## 2022-03-01 MED ORDER — POLYETHYLENE GLYCOL 3350 17 G PO PACK: 17.0000 g | PACK | Freq: Every day | ORAL | Status: DC | PRN

## 2022-03-01 MED ORDER — SODIUM CHLORIDE 0.9 % WEIGHT BASED INFUSION
1.0000 mL/kg/h | INTRAVENOUS | Status: DC
  Administered 2022-03-02: 1 mL/kg/h via INTRAVENOUS

## 2022-03-01 MED ORDER — NITROGLYCERIN 0.4 MG SL SUBL: 0.4000 mg | SUBLINGUAL_TABLET | SUBLINGUAL | Status: DC | PRN

## 2022-03-01 MED ORDER — HEPARIN BOLUS VIA INFUSION
4000.0000 [IU] | Freq: Once | INTRAVENOUS | Status: AC
Start: 2022-03-01 — End: 2022-03-01
  Administered 2022-03-01: 4000 [IU] via INTRAVENOUS
  Filled 2022-03-01: qty 4000

## 2022-03-01 MED ORDER — SACUBITRIL-VALSARTAN 24-26 MG PO TABS
1.0000 | ORAL_TABLET | Freq: Two times a day (BID) | ORAL | Status: DC
  Administered 2022-03-02 – 2022-03-03 (×3): 1 via ORAL
  Filled 2022-03-01 (×5): qty 1

## 2022-03-01 MED ORDER — ATORVASTATIN CALCIUM 10 MG PO TABS
20.0000 mg | ORAL_TABLET | Freq: Every day | ORAL | Status: DC
  Administered 2022-03-02: 20 mg via ORAL
  Filled 2022-03-01: qty 2

## 2022-03-01 MED ORDER — SODIUM CHLORIDE 0.9 % IV SOLN: 250.0000 mL | INTRAVENOUS | Status: DC | PRN

## 2022-03-01 MED ORDER — HYDRALAZINE HCL 20 MG/ML IJ SOLN: 10.0000 mg | Freq: Four times a day (QID) | INTRAMUSCULAR | Status: DC | PRN

## 2022-03-01 MED ORDER — HEPARIN (PORCINE) 25000 UT/250ML-% IV SOLN
1100.0000 [IU]/h | INTRAVENOUS | Status: DC
  Administered 2022-03-01: 1100 [IU]/h via INTRAVENOUS
  Filled 2022-03-01: qty 250

## 2022-03-01 MED ORDER — ASPIRIN 81 MG PO CHEW
81.0000 mg | CHEWABLE_TABLET | ORAL | Status: AC
  Administered 2022-03-02: 81 mg via ORAL
  Filled 2022-03-01: qty 1

## 2022-03-01 MED ORDER — SODIUM CHLORIDE 0.9% FLUSH: 3.0000 mL | Freq: Two times a day (BID) | INTRAVENOUS | Status: DC

## 2022-03-01 MED ORDER — TICAGRELOR 90 MG PO TABS
90.0000 mg | ORAL_TABLET | Freq: Two times a day (BID) | ORAL | Status: DC
  Administered 2022-03-01 – 2022-03-03 (×4): 90 mg via ORAL
  Filled 2022-03-01 (×4): qty 1

## 2022-03-01 MED ORDER — ATORVASTATIN CALCIUM 40 MG PO TABS: 40.0000 mg | ORAL_TABLET | Freq: Every day | ORAL | Status: DC

## 2022-03-01 MED ORDER — SODIUM CHLORIDE 0.9% FLUSH
3.0000 mL | Freq: Two times a day (BID) | INTRAVENOUS | Status: DC
  Administered 2022-03-01 – 2022-03-02 (×2): 3 mL via INTRAVENOUS

## 2022-03-01 MED ORDER — ONDANSETRON HCL 4 MG/2ML IJ SOLN: 4.0000 mg | Freq: Four times a day (QID) | INTRAMUSCULAR | Status: DC | PRN

## 2022-03-01 MED ORDER — ASPIRIN 81 MG PO TBEC
81.0000 mg | DELAYED_RELEASE_TABLET | Freq: Every day | ORAL | Status: DC
  Administered 2022-03-03: 81 mg via ORAL
  Filled 2022-03-01 (×2): qty 1

## 2022-03-01 MED ORDER — INSULIN ASPART 100 UNIT/ML IJ SOLN
0.0000 [IU] | Freq: Three times a day (TID) | INTRAMUSCULAR | Status: DC
  Administered 2022-03-02: 2 [IU] via SUBCUTANEOUS
  Administered 2022-03-03: 3 [IU] via SUBCUTANEOUS

## 2022-03-01 MED ORDER — ONDANSETRON HCL 4 MG PO TABS: 4.0000 mg | ORAL_TABLET | Freq: Four times a day (QID) | ORAL | Status: DC | PRN

## 2022-03-01 MED ORDER — ACETAMINOPHEN 650 MG RE SUPP: 650.0000 mg | Freq: Four times a day (QID) | RECTAL | Status: DC | PRN

## 2022-03-01 MED ORDER — CARVEDILOL 6.25 MG PO TABS
6.2500 mg | ORAL_TABLET | Freq: Two times a day (BID) | ORAL | Status: DC
  Administered 2022-03-02 – 2022-03-03 (×2): 6.25 mg via ORAL
  Filled 2022-03-01 (×2): qty 1

## 2022-03-01 MED ORDER — SODIUM CHLORIDE 0.9% FLUSH: 3.0000 mL | INTRAVENOUS | Status: DC | PRN

## 2022-03-01 MED ORDER — ASPIRIN 325 MG PO TABS
325.0000 mg | ORAL_TABLET | Freq: Once | ORAL | Status: AC
  Administered 2022-03-01: 325 mg via ORAL
  Filled 2022-03-01: qty 1

## 2022-03-01 MED ORDER — SODIUM CHLORIDE 0.9 % WEIGHT BASED INFUSION
3.0000 mL/kg/h | INTRAVENOUS | Status: DC
  Administered 2022-03-02: 3 mL/kg/h via INTRAVENOUS

## 2022-03-01 MED ORDER — ACETAMINOPHEN 325 MG PO TABS
650.0000 mg | ORAL_TABLET | Freq: Four times a day (QID) | ORAL | Status: DC | PRN
  Administered 2022-03-02: 650 mg via ORAL
  Filled 2022-03-01: qty 2

## 2022-03-01 NOTE — Progress Notes (Signed)
   03/01/22 2247  Vitals  Temp 98 F (36.7 C)  Temp Source Oral  BP (!) 153/82  MAP (mmHg) 103  BP Location Left Arm  BP Method Automatic  Patient Position (if appropriate) Lying  Pulse Rate 62  Pulse Rate Source Monitor  ECG Heart Rate 77  Resp (!) 21  Level of Consciousness  Level of Consciousness Alert  MEWS COLOR  MEWS Score Color Green  Oxygen Therapy  SpO2 99 %  O2 Device Room Air  Pain Assessment  Pain Scale 0-10  Pain Score 0  MEWS Score  MEWS Temp 0  MEWS Systolic 0  MEWS Pulse 0  MEWS RR 1  MEWS LOC 0  MEWS Score 1   Pt admitted to [facility]13. Pt oriented to unit. CCMD called. Call light by pt. Pt denies pain. RN will continue to monitor pt by unit protocol.

## 2022-03-01 NOTE — ED Provider Triage Note (Signed)
Emergency Medicine Provider Triage Evaluation Note  Logan Brewer , a 66 y.o. male with history of hypertension, STEMI, CHF was evaluated in triage.  Pt complains of chest pain that started yesterday while at work.  Pain was quite severe on the left side of his chest radiating into his left arm.  Since then, pain has been coming and going without any alleviating or aggravating factors.  Associated symptoms to include shortness of breath.  Patient has been without his medications for 2 days.  His cardiologist is Dr. Gala Romney who last saw him in March.  He has been working with the office for medication assistance.  Patient denies abdominal pain, nausea, vomiting and diarrhea.  Review of Systems  Positive:  Negative:   Physical Exam  BP (!) 159/73 (BP Location: Right Arm)   Pulse 74   Temp 98.8 F (37.1 C) (Oral)   Resp 16   Ht 5\' 7"  (1.702 m)   Wt 84.8 kg   SpO2 99%   BMI 29.29 kg/m  Gen:   Awake, no distress, nondiaphoretic Resp:  Normal effort  MSK:   Moves extremities without difficulty  Other:    Medical Decision Making  Medically screening exam initiated at 4:39 PM.  Appropriate orders placed.  Jveon Pound was informed that the remainder of the evaluation will be completed by another provider, this initial triage assessment does not replace that evaluation, and the importance of remaining in the ED until their evaluation is complete.     [name], Janell Quiet 03/01/22 1641

## 2022-03-01 NOTE — Consult Note (Signed)
Cardiology Consultation   Patient ID: Logan Brewer MRN: [id number]; DOB: 11-30-1955  Admit date: 03/01/2022 Date of Consult: 03/01/2022  PCP:  Grayce Sessions, NP   [facility] Providers Cardiologist:  Bryan Lemma, MD  Advanced Heart Failure:  Arvilla Meres, MD       Patient Profile:   Logan Brewer is a 66 y.o. 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 Silverio Lay.  History of Present Illness:   Logan Brewer uncontrolled HTN and heavy, longstanding tobacco use, systolic heart failure, CAD, and VT/VF arrest. Had VT/VF arrest in 9/22. ECG with anterior STEMI. Cath 99% ostial to proximal LAD treated with PCI/DES. Echo with EF up to 40%. Readmitted 10/22 with CP and SOB. Repeat echo EF 40-45%, Hstrop negative. Found to have AKI and GDMT held at discharge. Lost to follow up x 3 and reestablished in early 2023 with HF clinic. At last visit 09/17/21, he was NYHA III.  He works in [employer] and today was tearing off some carpeting, after completing this he was hammering sheet rock above his head and noticed sudden severe chest pain. Has been out of his medications for a few days including DAPT and statin. Currently chest pain free. Wife on phone and discussed his difficulty getting meds due to transportation issues. She is without her car and he was unable to pick up the refills.  No longer smoking cigarettes but does smoke marijuana.   EKG: inferior and lateral ST/T wave abnl, unchanged from prior. SR. Septal infarct. Telemetry: not on tele  Pertinent labs: first troponin 813, Cr 1.3, normal CBC.   Past Medical History:  Diagnosis Date   Hydrocele in adult    Hypertension    Inguinal hernia    Ischemic cardiomyopathy    Smoker 03/16/2021   smokes 1/2 ppd PTA per hx   STEMI  involving left anterior descending coronary artery (HCC) 03/12/2021    Past Surgical History:  Procedure Laterality Date   APPENDECTOMY     CORONARY STENT INTERVENTION N/A 03/11/2021   Procedure: CORONARY STENT INTERVENTION;  Surgeon: Marykay Lex, MD;  Location: [facility] INVASIVE CV LAB;  Service: Cardiovascular;  Laterality: N/A;   CORONARY/GRAFT ACUTE MI REVASCULARIZATION N/A 03/11/2021   Procedure: Coronary/Graft Acute MI Revascularization;  Surgeon: Marykay Lex, MD;  Location: [facility] INVASIVE CV LAB;  Service: Cardiovascular;  Laterality: N/A;   HYDROCELE EXCISION Bilateral 11/21/2019   Procedure: HYDROCELECTOMY ADULT;  Surgeon: Malen Gauze, MD;  Location: [facility];  Service: Urology;  Laterality: Bilateral;  [id number]   HYDROCELE EXCISION Right 04/07/2020   Procedure: RIGHT HYDROCELECTOMY;  Surgeon: Malen Gauze, MD;  Location: [facility];  Service: Urology;  Laterality: Right;   INGUINAL HERNIA REPAIR Left 11/21/2019   Procedure: HERNIA REPAIR INGUINAL ADULT;  Surgeon: [name], MD;  Location: [facility];  Service: General;  Laterality: Left;  [id number]   RIGHT/LEFT HEART CATH AND CORONARY ANGIOGRAPHY N/A 03/11/2021   Procedure: RIGHT/LEFT HEART CATH AND CORONARY ANGIOGRAPHY;  Surgeon: Marykay Lex, MD;  Location: [facility] INVASIVE CV LAB;  Service: Cardiovascular;  Laterality: N/A;     Home Medications:  Prior to Admission medications   Medication Sig Start Date End Date Taking? Authorizing Provider  amLODipine (NORVASC) 10 MG tablet Take 1 tablet (10 mg total) by mouth daily.  09/17/21   Bensimhon, Bevelyn Buckles, MD  aspirin EC 81 MG EC tablet Take 1 tablet (81 mg total) by mouth daily. Swallow whole. 03/19/21   Zannie Cove, MD  atorvastatin (LIPITOR) 20 MG tablet Take 1 tablet (20 mg total) by mouth daily. 09/17/21   Bensimhon, Bevelyn Buckles, MD  carvedilol (COREG) 6.25 MG tablet Take 1 tablet (6.25 mg total) by mouth 2 (two) times daily with a meal. 09/17/21   Bensimhon, Bevelyn Buckles, MD   dapagliflozin propanediol (FARXIGA) 10 MG TABS tablet Take 1 tablet (10 mg total) by mouth daily before breakfast. 09/17/21   Bensimhon, Bevelyn Buckles, MD  nitroGLYCERIN (NITROSTAT) 0.4 MG SL tablet Place 1 tablet (0.4 mg total) under the tongue every 5 (five) minutes as needed for chest pain. 09/17/21   Bensimhon, Bevelyn Buckles, MD  sacubitril-valsartan (ENTRESTO) 24-26 MG Take 1 tablet by mouth 2 (two) times daily. 07/01/21   Milford, Anderson Malta, FNP  spironolactone (ALDACTONE) 25 MG tablet Take 1 tablet (25 mg total) by mouth daily. 09/17/21   Bensimhon, Bevelyn Buckles, MD  ticagrelor (BRILINTA) 90 MG TABS tablet Take 1 tablet (90 mg total) by mouth 2 (two) times daily. 05/20/21   Grayce Sessions, NP    Inpatient Medications: Scheduled Meds:  aspirin  325 mg Oral Once   heparin  4,000 Units Intravenous Once   Continuous Infusions:  heparin     PRN Meds:   Allergies:   No Known Allergies  Social History:   Social History   Socioeconomic History   Marital status: Single    Spouse name: Not on file   Number of children: Not on file   Years of education: Not on file   Highest education level: Not on file  Occupational History   Not on file  Tobacco Use   Smoking status: Former    Packs/day: 0.50    Types: Cigarettes   Smokeless tobacco: Never  Vaping Use   Vaping Use: Never used  Substance and Sexual Activity   Alcohol use: No   Drug use: Yes    Types: Marijuana    Comment: occasionally    Sexual activity: Not on file  Other Topics Concern   Not on file  Social History Narrative   ** Merged History Encounter **       Social Determinants of Health   Financial Resource Strain: Low Risk  (09/17/2021)   Overall Financial Resource Strain (CARDIA)    Difficulty of Paying Living Expenses: Not very hard  Food Insecurity: No Food Insecurity (09/17/2021)   Hunger Vital Sign    Worried About Running Out of Food in the Last Year: Never true    Ran Out of Food in the Last Year: Never true   Transportation Needs: No Transportation Needs (09/17/2021)   PRAPARE - Administrator, Civil Service (Medical): No    Lack of Transportation (Non-Medical): No  Physical Activity: Not on file  Stress: Not on file  Social Connections: Not on file  Intimate Partner Violence: Not on file    Family History:    Family History  Problem Relation Age of Onset   Heart disease Neg Hx      ROS:  Please see the history of present illness.   All other ROS reviewed and negative.     Physical Exam/Data:   Vitals:   03/01/22 1615 03/01/22 1620  BP: (!) 159/73   Pulse: 74   Resp: 16   Temp: 98.8 F (37.1 C)  TempSrc: Oral   SpO2: 99%   Weight:  84.8 kg  Height:  5\' 7"  (1.702 m)   No intake or output data in the 24 hours ending 03/01/22 1910    03/01/2022    4:20 PM 12/15/2021   10:54 AM 11/03/2021   11:06 AM  Last 3 Weights  Weight (lbs) 187 lb 187 lb 6.4 oz 179 lb  Weight (kg) 84.823 kg 85.004 kg 81.194 kg     Body mass index is 29.29 kg/m.  Constitutional: No acute distress Eyes: sclera non-icteric, normal conjunctiva and lids ENMT: normal dentition, moist mucous membranes Cardiovascular: regular rhythm, normal rate, no murmur. S1 and S2 normal. No jugular venous distention.  Respiratory: clear to auscultation bilaterally GI : normal bowel sounds, soft and nontender. No distention.   MSK: extremities warm, well perfused. No edema.  NEURO: grossly nonfocal exam, moves all extremities. PSYCH: alert and oriented x 3, normal mood and affect.   EKG:  The EKG was personally reviewed and demonstrates:  SR septal infarct, inferior and lateral ST and T wave abnl. Telemetry:  Telemetry was personally reviewed and demonstrates:  not on tele  Relevant CV Studies: None this admission  Laboratory Data:  High Sensitivity Troponin:   Recent Labs  Lab 03/01/22 1708  TROPONINIHS 813*     Chemistry Recent Labs  Lab 03/01/22 1708  NA 139  K 4.3  CL 105  CO2 24   GLUCOSE 101*  BUN 15  CREATININE 1.31*  CALCIUM 9.3  GFRNONAA >60  ANIONGAP 10    No results for input(s): "PROT", "ALBUMIN", "AST", "ALT", "ALKPHOS", "BILITOT" in the last 168 hours. Lipids No results for input(s): "CHOL", "TRIG", "HDL", "LABVLDL", "LDLCALC", "CHOLHDL" in the last 168 hours.  Hematology Recent Labs  Lab 03/01/22 1708  WBC 7.7  RBC 4.26  HGB 13.9  HCT 41.0  MCV 96.2  MCH 32.6  MCHC 33.9  RDW 13.2  PLT 281   Thyroid No results for input(s): "TSH", "FREET4" in the last 168 hours.  BNPNo results for input(s): "BNP", "PROBNP" in the last 168 hours.  DDimer No results for input(s): "DDIMER" in the last 168 hours.   Radiology/Studies:  DG Chest 2 View  Result Date: 03/01/2022 CLINICAL DATA:  Chest pain. EXAM: CHEST - 2 VIEW COMPARISON:  Chest x-ray 04/08/2021 FINDINGS: The heart size and mediastinal contours are within normal limits. Both lungs are clear. The visualized skeletal structures are unremarkable. IMPRESSION: No active cardiopulmonary disease. Electronically Signed   By: 04/10/2021 M.D.   On: 03/01/2022 17:27     Assessment and Plan:   Principal Problem:   Non-ST elevation (NSTEMI) myocardial infarction [facility]) Active Problems:   HTN (hypertension)   Cardiac arrest (HCC)   Coronary artery disease involving native coronary artery of native heart with angina pectoris (HCC)   Chest pain   Mixed hyperlipidemia   Nicotine dependence, cigarettes, uncomplicated   Dyspnea on exertion  NSTEMI HTN CAD CP HLD DOE - hx of stemi with VT/VF OOH arrest. - out of medications for several days - exertional chest pain exacerbated by extreme activity while working his [employer] job.  - currently appears very comfortable. Consider CTA for aortic dissection if recurrent chest pain or hemodynamic decompensation.   - first trop elevated, cycle enzymes. - start IV heparin - SL nitro for CP - ASA and brilinta - resume - atorvastatin 20 mg daily resume -  amlodipine 10 mg daily resume - carvedilol 6.25 mg BID resume, not in  HF, NYHA Brewer II sx currently.  - farxiga 10 mg daily, resume - entresto - resume after cath tomorrow.  - spironolactone 25 mg daily, resume. - plan for cardiac cath tomorrow. D/w with pt and wife by phone, consent noted below.  INFORMED CONSENT: I have reviewed the risks, indications, and alternatives to cardiac catheterization, possible angioplasty, and stenting with the patient. Risks include but are not limited to bleeding, infection, vascular injury, stroke, myocardial infarction, arrhythmia, kidney injury, radiation-related injury in the case of prolonged fluoroscopy use, emergency cardiac surgery, and death. The patient understands the risks of serious complication is 1-2 in 1000 with diagnostic cardiac cath and 1-2% or less with angioplasty/stenting.     Risk Assessment/Risk Scores:     TIMI Risk Score for Unstable Angina or Non-ST Elevation MI:   The patient's TIMI risk score is 4, which indicates a 20% risk of all cause mortality, new or recurrent myocardial infarction or need for urgent revascularization in the next 14 days.          For questions or updates, please contact [facility] Please consult [email or web address] for contact info under    Signed, Parke Poisson, MD  03/01/2022 7:10 PM

## 2022-03-01 NOTE — ED Triage Notes (Signed)
Complains of cental chest pain that started today and radiates to left arm +SOB

## 2022-03-01 NOTE — H&P (Signed)
History and Physical    Patient: Logan Brewer MRN: [id number] DOA: 03/01/2022  Date of Service: the patient was seen and examined on 03/02/2022  Patient coming from: Home  Chief Complaint:  Chief Complaint  Patient presents with   Chest Pain    HPI:   66 year old male with past medical history of hypertension, hyperlipidemia, prediabetes, nicotine dependence and Vfib arrest and coronary artery disease  (S/P STEMI 02/2021 needing cath with DES to ostial LAD) as well as ischemic cardiomyopathy (Echo 08/2021 EF 40% with G1DD) presenting to [facility] emergency department with complaints of chest pain.  Patient explains that he was doing some roof work earlier today with a hammer and in the middle of this exertion had a sudden onset of chest discomfort.  Patient describes this chest discomfort as pressure-like in quality, midsternal in location and severe in intensity.  Patient denies any associated palpitations or shortness of breath.  Chest pain persisted for approximately 1 hour.  Chest discomfort was nonradiating.  Patient reports that this episode of chest discomfort is similar to when he has had chest discomfort in the past.  Due to the symptoms patient eventually presented to [facility] emergency department for evaluation.  Upon evaluation in the emergency department initial department was found to be 813 with EKG revealing T wave inversions in the lateral and inferior leads.  EDP discussed case with cardiology with Dr. Jacques Navy evaluating the patient at the bedside in consultation.  Heparin infusion was initiated.  The hospitalist group is now been called to assess the patient for admission to the hospital.    Review of Systems: Review of Systems  Cardiovascular:  Positive for chest pain.  All other systems reviewed and are negative.    Past Medical History:  Diagnosis Date   Hydrocele in adult    Hypertension    Inguinal hernia    Ischemic cardiomyopathy     Smoker 03/16/2021   smokes 1/2 ppd PTA per hx   STEMI involving left anterior descending coronary artery (HCC) 03/12/2021    Past Surgical History:  Procedure Laterality Date   APPENDECTOMY     CORONARY STENT INTERVENTION N/A 03/11/2021   Procedure: CORONARY STENT INTERVENTION;  Surgeon: Marykay Lex, MD;  Location: [facility] INVASIVE CV LAB;  Service: Cardiovascular;  Laterality: N/A;   CORONARY/GRAFT ACUTE MI REVASCULARIZATION N/A 03/11/2021   Procedure: Coronary/Graft Acute MI Revascularization;  Surgeon: Marykay Lex, MD;  Location: [facility] INVASIVE CV LAB;  Service: Cardiovascular;  Laterality: N/A;   HYDROCELE EXCISION Bilateral 11/21/2019   Procedure: HYDROCELECTOMY ADULT;  Surgeon: Malen Gauze, MD;  Location: [facility];  Service: Urology;  Laterality: Bilateral;  [id number]   HYDROCELE EXCISION Right 04/07/2020   Procedure: RIGHT HYDROCELECTOMY;  Surgeon: Malen Gauze, MD;  Location: [facility];  Service: Urology;  Laterality: Right;   INGUINAL HERNIA REPAIR Left 11/21/2019   Procedure: HERNIA REPAIR INGUINAL ADULT;  Surgeon: [name], MD;  Location: [facility];  Service: General;  Laterality: Left;  [id number]   RIGHT/LEFT HEART CATH AND CORONARY ANGIOGRAPHY N/A 03/11/2021   Procedure: RIGHT/LEFT HEART CATH AND CORONARY ANGIOGRAPHY;  Surgeon: Marykay Lex, MD;  Location: [facility] INVASIVE CV LAB;  Service: Cardiovascular;  Laterality: N/A;    Social History:  reports that he has quit smoking. His smoking use included cigarettes. He smoked an average of .5 packs per day. He has never used smokeless tobacco. He reports current drug use. Drug: Marijuana. He reports that he does not drink  alcohol.  No Known Allergies  Family History  Problem Relation Age of Onset   Heart disease Neg Hx     Prior to Admission medications   Medication Sig Start Date End Date Taking? Authorizing Provider  amLODipine (NORVASC) 10 MG tablet Take 1 tablet (10 mg total) by mouth daily. 09/17/21    Bensimhon, Bevelyn Buckles, MD  aspirin EC 81 MG EC tablet Take 1 tablet (81 mg total) by mouth daily. Swallow whole. 03/19/21   Zannie Cove, MD  atorvastatin (LIPITOR) 20 MG tablet Take 1 tablet (20 mg total) by mouth daily. 09/17/21   Bensimhon, Bevelyn Buckles, MD  carvedilol (COREG) 6.25 MG tablet Take 1 tablet (6.25 mg total) by mouth 2 (two) times daily with a meal. 09/17/21   Bensimhon, Bevelyn Buckles, MD  dapagliflozin propanediol (FARXIGA) 10 MG TABS tablet Take 1 tablet (10 mg total) by mouth daily before breakfast. 09/17/21   Bensimhon, Bevelyn Buckles, MD  nitroGLYCERIN (NITROSTAT) 0.4 MG SL tablet Place 1 tablet (0.4 mg total) under the tongue every 5 (five) minutes as needed for chest pain. 09/17/21   Bensimhon, Bevelyn Buckles, MD  sacubitril-valsartan (ENTRESTO) 24-26 MG Take 1 tablet by mouth 2 (two) times daily. 07/01/21   Milford, Anderson Malta, FNP  spironolactone (ALDACTONE) 25 MG tablet Take 1 tablet (25 mg total) by mouth daily. 09/17/21   Bensimhon, Bevelyn Buckles, MD  ticagrelor (BRILINTA) 90 MG TABS tablet Take 1 tablet (90 mg total) by mouth 2 (two) times daily. 05/20/21   Grayce Sessions, NP    Physical Exam:  Vitals:   03/01/22 2247 03/01/22 2249 03/01/22 2251 03/02/22 0430  BP: (!) 153/82 (!) 153/82  138/66  Pulse: 62 62  85  Resp: (!) 21 18  19   Temp: 98 F (36.7 C)   97.7 F (36.5 C)  TempSrc: Oral   Oral  SpO2: 99% 99%  99%  Weight:   83.2 kg   Height:        Constitutional: Awake alert and oriented x3, no associated distress.   Skin: no rashes, no lesions, good skin turgor noted. Eyes: Pupils are equally reactive to light.  No evidence of scleral icterus or conjunctival pallor.  ENMT: Moist mucous membranes noted.  Posterior pharynx clear of any exudate or lesions.   Neck: normal, supple, no masses, no thyromegaly.  No evidence of jugular venous distension.   Respiratory: clear to auscultation bilaterally, no wheezing, no crackles. Normal respiratory effort. No accessory muscle use.   Cardiovascular: Regular rate and rhythm, no murmurs / rubs / gallops. No extremity edema. 2+ pedal pulses. No carotid bruits.  Chest:   Nontender without crepitus or deformity.   Back:   Nontender without crepitus or deformity. Abdomen: Abdomen is soft and nontender.  No evidence of intra-abdominal masses.  Positive bowel sounds noted in all quadrants.   Musculoskeletal: No joint deformity upper and lower extremities. Good ROM, no contractures. Normal muscle tone.  Neurologic: CN 2-12 grossly intact. Sensation intact.  Patient moving all 4 extremities spontaneously.  Patient is following all commands.  Patient is responsive to verbal stimuli.   Psychiatric: Patient exhibits normal mood with appropriate affect.  Patient seems to possess insight as to their current situation.    Data Reviewed:  I have personally reviewed and interpreted labs, imaging.  Significant findings are   Lab Results  Component Value Date   CKTOTAL 151 08/31/2018     Lab Results  Component Value Date   WBC 7.1 03/02/2022  HGB 13.0 03/02/2022   HCT 37.3 (L) 03/02/2022   MCV 93.7 03/02/2022   PLT 211 03/02/2022   Lab Results  Component Value Date   K 3.3 (L) 03/02/2022   Lab Results  Component Value Date   BUN 13 03/02/2022   Lab Results  Component Value Date   CREATININE 1.19 03/02/2022    CXR:   Chest X-ray was personally reviewed.  No evidence of focal infiltrates.  No evidence of pleural effusion.  No evidence of pneumothorax.    EKG: Personally reviewed.  Rhythm is normal sinus rhythm with heart rate of 80 bpm.  Notable T wave inversions in the inferior and lateral leads. no dynamic ST segment changes appreciated.  Assessment and Plan: * Non-ST elevation (NSTEMI) myocardial infarction [facility]) Patient presenting with typical chest pain and markedly elevated troponin concerning for NSTEMI Patient reports intermittent compliance with medications in the setting of known coronary disease and  numerous risk factors Cardiology has evaluated patient in consultation and their advice is appreciated Patient has been placed on a heparin infusion N.p.o. after midnight with plan for cardiac catheterization in the morning Resuming antiplatelet therapy with aspirin and Brilinta Resuming home regimen of statin therapy however this will be titrated upwards if LDL is above 70 Cycling cardiac enzymes to peak for now Monitoring patient on telemetry Sublingual nitroglycerin as needed for further episodes of chest discomfort  Coronary artery disease involving native coronary artery of native heart with angina pectoris (HCC) Please see assessment and plan above  Chronic systolic CHF (congestive heart failure) (HCC) No clinical evidence of cardiogenic volume overload Strict input and output monitoring Daily weights Low-sodium diet We will resume home regimen of Entresto after the cardiac catheterization We will resume home regimen of spironolactone   Essential hypertension Patient reports intermittent compliance Resume patients home regimen of oral antihypertensives Titrate antihypertensive regimen as necessary to achieve adequate BP control PRN intravenous antihypertensives for excessively elevated blood pressure    Prediabetes To ensure patient maintains tight glycemic control performing Accu-Cheks before every meal and nightly with sliding scale insulin Obtaining hemoglobin A1c Low carbohydrate diet once patient is no longer n.p.o.  Mixed hyperlipidemia Resume home regimen of statin therapy which will be titrated upwards if LDL is above 70       Code Status:  Full code  code status decision has been confirmed with: patient Family Communication: deferred   Consults: Cardiology  Severity of Illness:  The appropriate patient status for this patient is INPATIENT. Inpatient status is judged to be reasonable and necessary in order to provide the required intensity of service to  ensure the patient's safety. The patient's presenting symptoms, physical exam findings, and initial radiographic and laboratory data in the context of their chronic comorbidities is felt to place them at high risk for further clinical deterioration. Furthermore, it is not anticipated that the patient will be medically stable for discharge from the hospital within 2 midnights of admission.   * I certify that at the point of admission it is my clinical judgment that the patient will require inpatient hospital care spanning beyond 2 midnights from the point of admission due to high intensity of service, high risk for further deterioration and high frequency of surveillance required.*  Author:  Marinda Elk MD  03/02/2022 5:37 AM

## 2022-03-01 NOTE — Plan of Care (Signed)
  Problem: Cardiovascular: Goal: Ability to achieve and maintain adequate cardiovascular perfusion will improve Outcome: Progressing   Problem: Health Behavior/Discharge Planning: Goal: Ability to safely manage health-related needs after discharge will improve Outcome: Progressing   Problem: Education: Goal: Ability to describe self-care measures that may prevent or decrease complications (Diabetes Survival Skills Education) will improve Outcome: Progressing   Problem: Coping: Goal: Ability to adjust to condition or change in health will improve Outcome: Progressing

## 2022-03-01 NOTE — ED Provider Notes (Signed)
[facility] EMERGENCY DEPARTMENT Provider Note   CSN: [id number] Arrival date & time: 03/01/22  1555     History  Chief Complaint  Patient presents with   Chest Pain    Logan Brewer is a 66 y.o. male history of CAD with stent, hypertension, senting with chest pain.  Patient states that he was trying to knock down the roof with a hammer and had sudden onset of chest pain.  He states that his pressure-like sensation lasted for about an hour.  He had a heart attack about a year ago that required her stent and his wife asked him to come to the ER for evaluation.  Of note, patient did run out of of his Lipitor and blood pressure medicine recently but has been taking his Brilinta.  He also missed several cardiology appointments.  Patient has no chest pain currently  The history is provided by the patient.       Home Medications Prior to Admission medications   Medication Sig Start Date End Date Taking? Authorizing Provider  amLODipine (NORVASC) 10 MG tablet Take 1 tablet (10 mg total) by mouth daily. 09/17/21   Bensimhon, Bevelyn Buckles, MD  aspirin EC 81 MG EC tablet Take 1 tablet (81 mg total) by mouth daily. Swallow whole. 03/19/21   Zannie Cove, MD  atorvastatin (LIPITOR) 20 MG tablet Take 1 tablet (20 mg total) by mouth daily. 09/17/21   Bensimhon, Bevelyn Buckles, MD  carvedilol (COREG) 6.25 MG tablet Take 1 tablet (6.25 mg total) by mouth 2 (two) times daily with a meal. 09/17/21   Bensimhon, Bevelyn Buckles, MD  dapagliflozin propanediol (FARXIGA) 10 MG TABS tablet Take 1 tablet (10 mg total) by mouth daily before breakfast. 09/17/21   Bensimhon, Bevelyn Buckles, MD  nitroGLYCERIN (NITROSTAT) 0.4 MG SL tablet Place 1 tablet (0.4 mg total) under the tongue every 5 (five) minutes as needed for chest pain. 09/17/21   Bensimhon, Bevelyn Buckles, MD  sacubitril-valsartan (ENTRESTO) 24-26 MG Take 1 tablet by mouth 2 (two) times daily. 07/01/21   Milford, Anderson Malta, FNP  spironolactone (ALDACTONE) 25 MG  tablet Take 1 tablet (25 mg total) by mouth daily. 09/17/21   Bensimhon, Bevelyn Buckles, MD  ticagrelor (BRILINTA) 90 MG TABS tablet Take 1 tablet (90 mg total) by mouth 2 (two) times daily. 05/20/21   Grayce Sessions, NP      Allergies    Patient has no known allergies.    Review of Systems   Review of Systems  Cardiovascular:  Positive for chest pain.  All other systems reviewed and are negative.   Physical Exam Updated Vital Signs BP (!) 159/73 (BP Location: Right Arm)   Pulse 74   Temp 98.8 F (37.1 C) (Oral)   Resp 16   Ht 5\' 7"  (1.702 m)   Wt 84.8 kg   SpO2 99%   BMI 29.29 kg/m  Physical Exam Vitals and nursing note reviewed.  Constitutional:      Comments: Chronically ill but not acutely ill  HENT:     Head: Normocephalic.  Eyes:     Extraocular Movements: Extraocular movements intact.     Pupils: Pupils are equal, round, and reactive to light.  Cardiovascular:     Rate and Rhythm: Normal rate and regular rhythm.     Heart sounds: Normal heart sounds.  Pulmonary:     Effort: Pulmonary effort is normal.     Breath sounds: Normal breath sounds.  Abdominal:  General: Bowel sounds are normal.     Palpations: Abdomen is soft.  Musculoskeletal:        General: Normal range of motion.  Skin:    General: Skin is warm.     Capillary Refill: Capillary refill takes less than 2 seconds.  Neurological:     General: No focal deficit present.     Mental Status: He is alert and oriented to person, place, and time.  Psychiatric:        Mood and Affect: Mood normal.        Behavior: Behavior normal.     ED Results / Procedures / Treatments   Labs (all labs ordered are listed, but only abnormal results are displayed) Labs Reviewed  BASIC METABOLIC PANEL - Abnormal; Notable for the following components:      Result Value   Glucose, Bld 101 (*)    Creatinine, Ser 1.31 (*)    All other components within normal limits  TROPONIN I (HIGH SENSITIVITY) - Abnormal;  Notable for the following components:   Troponin I (High Sensitivity) 813 (*)    All other components within normal limits  CBC  HEPARIN LEVEL (UNFRACTIONATED)  CBC  TROPONIN I (HIGH SENSITIVITY)    EKG EKG Interpretation  Date/Time:  Monday March 01 2022 16:17:22 EDT Ventricular Rate:  80 PR Interval:  154 QRS Duration: 96 QT Interval:  394 QTC Calculation: 454 R Axis:   68 Text Interpretation: Normal sinus rhythm with sinus arrhythmia Septal infarct , age undetermined ST & T wave abnormality, consider inferolateral ischemia Abnormal ECG When compared with ECG of 01-Jul-2021 11:36, No significant change since last tracing Confirmed by Richardean Canal [phone]) on 03/01/2022 6:36:37 PM  Radiology DG Chest 2 View  Result Date: 03/01/2022 CLINICAL DATA:  Chest pain. EXAM: CHEST - 2 VIEW COMPARISON:  Chest x-ray 04/08/2021 FINDINGS: The heart size and mediastinal contours are within normal limits. Both lungs are clear. The visualized skeletal structures are unremarkable. IMPRESSION: No active cardiopulmonary disease. Electronically Signed   By: Darliss Cheney M.D.   On: 03/01/2022 17:27    Procedures Procedures    CRITICAL CARE Performed by: Richardean Canal   Total critical care time: 30 minutes  Critical care time was exclusive of separately billable procedures and treating other patients.  Critical care was necessary to treat or prevent imminent or life-threatening deterioration.  Critical care was time spent personally by me on the following activities: development of treatment plan with patient and/or surrogate as well as nursing, discussions with consultants, evaluation of patient's response to treatment, examination of patient, obtaining history from patient or surrogate, ordering and performing treatments and interventions, ordering and review of laboratory studies, ordering and review of radiographic studies, pulse oximetry and re-evaluation of patient's  condition.   Medications Ordered in ED Medications  aspirin tablet 325 mg (has no administration in time range)  heparin bolus via infusion 4,000 Units (has no administration in time range)  heparin ADULT infusion 100 units/mL (25000 units/255mL) (has no administration in time range)    ED Course/ Medical Decision Making/ A&P                           Medical Decision Making Ulysses Alper is a 66 y.o. male history of hypertension, CAD with stents here presenting with chest pain.  Concern for possible NSTEMI.  I do not think he has PE or dissection.  Plan to get CBC and CMP  and troponin.  7:08 PM Patient troponin is 800.  No chest pain currently.  Concern for possible NSTEMI.  I discussed case with Dr. Mayford Knife.  She agreed with aspirin and heparin.  She states that patient was admitted to the medicine service last time and patient did miss medications and also multiple cardiology appointments.  She request that medicine be the primary service and cardiology will consult.  Patient may need cardiac catheterization if troponin continue to trend up.    Problems Addressed: Elevated troponin: acute illness or injury NSTEMI (non-ST elevated myocardial infarction) [facility]): acute illness or injury  Amount and/or Complexity of Data Reviewed Labs: ordered. Decision-making details documented in ED Course. Radiology: ordered and independent interpretation performed. Decision-making details documented in ED Course. ECG/medicine tests: ordered and independent interpretation performed. Decision-making details documented in ED Course.  Risk OTC drugs. Prescription drug management. Decision regarding hospitalization.    Final Clinical Impression(s) / ED Diagnoses Final diagnoses:  None    Rx / DC Orders ED Discharge Orders     None         Charlynne Pander, MD 03/01/22 1910

## 2022-03-01 NOTE — Progress Notes (Signed)
ANTICOAGULATION CONSULT NOTE - Initial Consult  Pharmacy Consult for heparin Indication: chest pain/ACS  No Known Allergies  Patient Measurements: Height: 5\' 7"  (170.2 cm) Weight: 84.8 kg (187 lb) IBW/kg (Calculated) : 66.1 Heparin Dosing Weight: 83kg  Vital Signs: Temp: 98.8 F (37.1 C) (09/11 1615) Temp Source: Oral (09/11 1615) BP: 159/73 (09/11 1615) Pulse Rate: 74 (09/11 1615)  Labs: Recent Labs    03/01/22 1708  HGB 13.9  HCT 41.0  PLT 281  CREATININE 1.31*  TROPONINIHS 813*    Estimated Creatinine Clearance: 59.3 mL/min (A) (by C-G formula based on SCr of 1.31 mg/dL (H)).   Medical History: Past Medical History:  Diagnosis Date   Hydrocele in adult    Hypertension    Inguinal hernia    Ischemic cardiomyopathy    Smoker 03/16/2021   smokes 1/2 ppd PTA per hx   STEMI involving left anterior descending coronary artery (HCC) 03/12/2021     Assessment: 30 YOM presenting with CP and SOB, he is not on anticoagulation PTA, CBC wnl  Goal of Therapy:  Heparin level 0.3-0.7 units/ml Monitor platelets by anticoagulation protocol: Yes   Plan:  Heparin 4000 units IV x 1, and gtt at 1100 units/hr F/u 6 hour heparin level F/u cards eval and recs  79, PharmD Clinical Pharmacist ED Pharmacist Phone # [phone] 03/01/2022 6:38 PM

## 2022-03-02 ENCOUNTER — Encounter (HOSPITAL_COMMUNITY)
Admission: EM | Disposition: A | Discharge status: Home / Self Care | Payer: Self-pay | Source: Home / Self Care | Attending: Internal Medicine

## 2022-03-02 ENCOUNTER — Other Ambulatory Visit (HOSPITAL_COMMUNITY): Payer: Self-pay

## 2022-03-02 ENCOUNTER — Encounter (HOSPITAL_COMMUNITY): Payer: Self-pay | Admitting: Cardiology

## 2022-03-02 DIAGNOSIS — I201 Angina pectoris with documented spasm: Secondary | ICD-10-CM

## 2022-03-02 DIAGNOSIS — R778 Other specified abnormalities of plasma proteins: Secondary | ICD-10-CM

## 2022-03-02 HISTORY — PX: CORONARY STENT INTERVENTION: CATH118234

## 2022-03-02 HISTORY — PX: LEFT HEART CATH AND CORONARY ANGIOGRAPHY: CATH118249

## 2022-03-02 LAB — COMPREHENSIVE METABOLIC PANEL
ALT: 20 U/L (ref 0–44)
AST: 53 U/L — ABNORMAL HIGH (ref 15–41)
Albumin: 3.2 g/dL — ABNORMAL LOW (ref 3.5–5.0)
Alkaline Phosphatase: 55 U/L (ref 38–126)
Anion gap: 9 (ref 5–15)
BUN: 13 mg/dL (ref 8–23)
CO2: 21 mmol/L — ABNORMAL LOW (ref 22–32)
Calcium: 9 mg/dL (ref 8.9–10.3)
Chloride: 107 mmol/L (ref 98–111)
Creatinine, Ser: 1.19 mg/dL (ref 0.61–1.24)
GFR, Estimated: >60 mL/min (ref >60)
Glucose, Bld: 160 mg/dL — ABNORMAL HIGH (ref 70–99)
Potassium: 3.3 mmol/L — ABNORMAL LOW (ref 3.5–5.1)
Sodium: 137 mmol/L (ref 135–145)
Total Bilirubin: 0.5 mg/dL (ref 0.3–1.2)
Total Protein: 6.4 g/dL — ABNORMAL LOW (ref 6.5–8.1)

## 2022-03-02 LAB — HEPARIN LEVEL (UNFRACTIONATED)
Heparin Unfractionated: 0.68 IU/mL (ref 0.30–0.70)
Heparin Unfractionated: 0.61 IU/mL (ref 0.30–0.70)

## 2022-03-02 LAB — CBC WITH DIFFERENTIAL/PLATELET
Abs Immature Granulocytes: 0 10*3/uL (ref 0.00–0.07)
Basophils Absolute: 0 10*3/uL (ref 0.0–0.1)
Basophils Relative: 0 %
Eosinophils Absolute: 0.2 10*3/uL (ref 0.0–0.5)
Eosinophils Relative: 2 %
HCT: 37.3 % — ABNORMAL LOW (ref 39.0–52.0)
Hemoglobin: 13 g/dL (ref 13.0–17.0)
Immature Granulocytes: 0 %
Lymphocytes Relative: 42 %
Lymphs Abs: 3 10*3/uL (ref 0.7–4.0)
MCH: 32.7 pg (ref 26.0–34.0)
MCHC: 34.9 g/dL (ref 30.0–36.0)
MCV: 93.7 fL (ref 80.0–100.0)
Monocytes Absolute: 0.7 10*3/uL (ref 0.1–1.0)
Monocytes Relative: 10 %
Neutro Abs: 3.2 10*3/uL (ref 1.7–7.7)
Neutrophils Relative %: 46 %
Platelets: 211 10*3/uL (ref 150–400)
RBC: 3.98 MIL/uL — ABNORMAL LOW (ref 4.22–5.81)
RDW: 13 % (ref 11.5–15.5)
WBC: 7.1 10*3/uL (ref 4.0–10.5)
nRBC: 0 % (ref 0.0–0.2)

## 2022-03-02 LAB — LIPID PANEL
Cholesterol: 157 mg/dL (ref 0–200)
HDL: 42 mg/dL (ref >40)
LDL Cholesterol: 108 mg/dL — ABNORMAL HIGH (ref 0–99)
Total CHOL/HDL Ratio: 3.7 RATIO
Triglycerides: 33 mg/dL (ref <150)
VLDL: 7 mg/dL (ref 0–40)

## 2022-03-02 LAB — POCT ACTIVATED CLOTTING TIME
Activated Clotting Time: >1000 seconds
Activated Clotting Time: 323 seconds

## 2022-03-02 LAB — GLUCOSE, CAPILLARY
Glucose-Capillary: 89 mg/dL (ref 70–99)
Glucose-Capillary: 118 mg/dL — ABNORMAL HIGH (ref 70–99)
Glucose-Capillary: 168 mg/dL — ABNORMAL HIGH (ref 70–99)

## 2022-03-02 LAB — HEMOGLOBIN A1C
Hgb A1c MFr Bld: 6.2 % — ABNORMAL HIGH (ref 4.8–5.6)
Mean Plasma Glucose: 131.24 mg/dL

## 2022-03-02 LAB — MAGNESIUM: Magnesium: 1.8 mg/dL (ref 1.7–2.4)

## 2022-03-02 LAB — TROPONIN I (HIGH SENSITIVITY): Troponin I (High Sensitivity): 5268 ng/L (ref <18)

## 2022-03-02 SURGERY — LEFT HEART CATH AND CORONARY ANGIOGRAPHY
Anesthesia: LOCAL

## 2022-03-02 MED ORDER — ATORVASTATIN CALCIUM 80 MG PO TABS
80.0000 mg | ORAL_TABLET | Freq: Every day | ORAL | Status: DC
  Administered 2022-03-03: 80 mg via ORAL
  Filled 2022-03-02: qty 1

## 2022-03-02 MED ORDER — LIDOCAINE HCL (PF) 1 % IJ SOLN
INTRAMUSCULAR | Status: DC | PRN
  Administered 2022-03-02: 2 mL

## 2022-03-02 MED ORDER — LIDOCAINE HCL (PF) 1 % IJ SOLN
INTRAMUSCULAR | Status: AC
  Filled 2022-03-02: qty 30

## 2022-03-02 MED ORDER — VERAPAMIL HCL 2.5 MG/ML IV SOLN
INTRAVENOUS | Status: DC | PRN
  Administered 2022-03-02: 10 mL via INTRA_ARTERIAL

## 2022-03-02 MED ORDER — FENTANYL CITRATE (PF) 100 MCG/2ML IJ SOLN
INTRAMUSCULAR | Status: AC
  Filled 2022-03-02: qty 2

## 2022-03-02 MED ORDER — POTASSIUM CHLORIDE CRYS ER 20 MEQ PO TBCR
40.0000 meq | EXTENDED_RELEASE_TABLET | Freq: Once | ORAL | Status: AC
  Administered 2022-03-02: 40 meq via ORAL
  Filled 2022-03-02: qty 2

## 2022-03-02 MED ORDER — LABETALOL HCL 5 MG/ML IV SOLN: 10.0000 mg | INTRAVENOUS | Status: AC | PRN

## 2022-03-02 MED ORDER — FENTANYL CITRATE (PF) 100 MCG/2ML IJ SOLN
INTRAMUSCULAR | Status: DC | PRN
  Administered 2022-03-02: 25 ug via INTRAVENOUS

## 2022-03-02 MED ORDER — AMLODIPINE BESYLATE 10 MG PO TABS
10.0000 mg | ORAL_TABLET | Freq: Every day | ORAL | Status: DC
  Administered 2022-03-02 – 2022-03-03 (×2): 10 mg via ORAL
  Filled 2022-03-02 (×2): qty 1

## 2022-03-02 MED ORDER — SODIUM CHLORIDE 0.9 % IV SOLN: 250.0000 mL | INTRAVENOUS | Status: DC | PRN

## 2022-03-02 MED ORDER — SODIUM CHLORIDE 0.9% FLUSH: 3.0000 mL | INTRAVENOUS | Status: DC | PRN

## 2022-03-02 MED ORDER — SODIUM CHLORIDE 0.9 % WEIGHT BASED INFUSION
1.0000 mL/kg/h | INTRAVENOUS | Status: AC
  Administered 2022-03-02: 1 mL/kg/h via INTRAVENOUS

## 2022-03-02 MED ORDER — HEPARIN (PORCINE) IN NACL 1000-0.9 UT/500ML-% IV SOLN
INTRAVENOUS | Status: DC | PRN
  Administered 2022-03-02 (×2): 500 mL

## 2022-03-02 MED ORDER — MIDAZOLAM HCL 2 MG/2ML IJ SOLN
INTRAMUSCULAR | Status: DC | PRN
  Administered 2022-03-02: 2 mg via INTRAVENOUS

## 2022-03-02 MED ORDER — MIDAZOLAM HCL 2 MG/2ML IJ SOLN
INTRAMUSCULAR | Status: AC
  Filled 2022-03-02: qty 2

## 2022-03-02 MED ORDER — IOHEXOL 350 MG/ML SOLN
INTRAVENOUS | Status: DC | PRN
  Administered 2022-03-02: 80 mL

## 2022-03-02 MED ORDER — SODIUM CHLORIDE 0.9% FLUSH
3.0000 mL | Freq: Two times a day (BID) | INTRAVENOUS | Status: DC
  Administered 2022-03-03: 3 mL via INTRAVENOUS

## 2022-03-02 MED ORDER — HEPARIN (PORCINE) IN NACL 1000-0.9 UT/500ML-% IV SOLN
INTRAVENOUS | Status: AC
  Filled 2022-03-02: qty 1000

## 2022-03-02 MED ORDER — SPIRONOLACTONE 25 MG PO TABS
25.0000 mg | ORAL_TABLET | Freq: Every day | ORAL | Status: DC
  Administered 2022-03-02 – 2022-03-03 (×2): 25 mg via ORAL
  Filled 2022-03-02 (×2): qty 1

## 2022-03-02 MED ORDER — HEPARIN SODIUM (PORCINE) 1000 UNIT/ML IJ SOLN
INTRAMUSCULAR | Status: DC | PRN
  Administered 2022-03-02: 5000 [IU] via INTRAVENOUS
  Administered 2022-03-02: 4000 [IU] via INTRAVENOUS

## 2022-03-02 SURGICAL SUPPLY — 19 items
BALL SAPPHIRE NC24 3.25X12 (BALLOONS) ×1
BALLN SCOREFLEX 3.0X15 (BALLOONS) ×1
BALLOON SAPPHIRE NC24 3.25X12 (BALLOONS) IMPLANT
BALLOON SCOREFLEX 3.0X15 (BALLOONS) IMPLANT
CATH LAUNCHER 6FR JL3.5 (CATHETERS) IMPLANT
CATH OPTITORQUE TIG 4.0 5F (CATHETERS) IMPLANT
DEVICE RAD COMP TR BAND LRG (VASCULAR PRODUCTS) IMPLANT
GLIDESHEATH SLEND SS 6F .021 (SHEATH) IMPLANT
GUIDEWIRE INQWIRE 1.5J.035X260 (WIRE) IMPLANT
INQWIRE 1.5J .035X260CM (WIRE) ×1
KIT ENCORE 26 ADVANTAGE (KITS) IMPLANT
KIT HEART LEFT (KITS) ×1 IMPLANT
PACK CARDIAC CATHETERIZATION (CUSTOM PROCEDURE TRAY) ×1 IMPLANT
SHEATH PROBE COVER 6X72 (BAG) IMPLANT
SYR MEDRAD MARK 7 150ML (SYRINGE) ×1 IMPLANT
TRANSDUCER W/STOPCOCK (MISCELLANEOUS) ×1 IMPLANT
TUBING CIL FLEX 10 FLL-RA (TUBING) ×1 IMPLANT
WIRE ASAHI PROWATER 180CM (WIRE) IMPLANT
WIRE MICROINTRODUCER 60CM (WIRE) IMPLANT

## 2022-03-02 NOTE — H&P (View-Only) (Signed)
Rounding Note    Patient Name: Logan Brewer Date of Encounter: 03/02/2022  [facility] Cardiologist: Bryan Lemma, MD   Subjective   No chest pain this morning. Reports he ran out of his medications about 3 days prior to admission.  Inpatient Medications    Scheduled Meds:  amLODipine  10 mg Oral Daily   aspirin EC  81 mg Oral Daily   atorvastatin  20 mg Oral Daily   carvedilol  6.25 mg Oral BID WC   insulin aspart  0-9 Units Subcutaneous TID AC & HS   sacubitril-valsartan  1 tablet Oral BID   sodium chloride flush  3 mL Intravenous Q12H   spironolactone  25 mg Oral Daily   ticagrelor  90 mg Oral BID   Continuous Infusions:  sodium chloride     sodium chloride 1 mL/kg/hr (03/02/22 0522)   heparin 1,100 Units/hr (03/02/22 0726)   PRN Meds: sodium chloride, acetaminophen **OR** acetaminophen, hydrALAZINE, nitroGLYCERIN, ondansetron **OR** ondansetron (ZOFRAN) IV, polyethylene glycol, sodium chloride flush   Vital Signs    Vitals:   03/01/22 2249 03/01/22 2251 03/02/22 0430 03/02/22 0811  BP: (!) 153/82  138/66 137/67  Pulse: 62  85   Resp: 18  19 19   Temp:   97.7 F (36.5 C) 98.2 F (36.8 C)  TempSrc:   Oral Oral  SpO2: 99%  99% 97%  Weight:  83.2 kg    Height:        Intake/Output Summary (Last 24 hours) at 03/02/2022 0857 Last data filed at 03/02/2022 0726 Gross per 24 hour  Intake 161.79 ml  Output --  Net 161.79 ml      03/01/2022   10:51 PM 03/01/2022    4:20 PM 12/15/2021   10:54 AM  Last 3 Weights  Weight (lbs) 183 lb 6.8 oz 187 lb 187 lb 6.4 oz  Weight (kg) 83.2 kg 84.823 kg 85.004 kg      Telemetry    Sinus Rhythm, PVCs - Personally Reviewed  ECG    No new tracing this morning  Physical Exam   GEN: No acute distress.   Neck: No JVD Cardiac: RRR, no murmurs, rubs, or gallops.  Respiratory: Clear to auscultation bilaterally. GI: Soft, nontender, non-distended  MS: No edema; No deformity. Neuro:  Nonfocal  Psych:  Normal affect   Labs    High Sensitivity Troponin:   Recent Labs  Lab 03/01/22 1708 03/01/22 2014  TROPONINIHS 813* 3,917*     Chemistry Recent Labs  Lab 03/01/22 1708 03/02/22 0120  NA 139 137  K 4.3 3.3*  CL 105 107  CO2 24 21*  GLUCOSE 101* 160*  BUN 15 13  CREATININE 1.31* 1.19  CALCIUM 9.3 9.0  MG  --  1.8  PROT  --  6.4*  ALBUMIN  --  3.2*  AST  --  53*  ALT  --  20  ALKPHOS  --  55  BILITOT  --  0.5  GFRNONAA >60 >60  ANIONGAP 10 9    Lipids  Recent Labs  Lab 03/02/22 0120  CHOL 157  TRIG 33  HDL 42  LDLCALC 108*  CHOLHDL 3.7    Hematology Recent Labs  Lab 03/01/22 1708 03/02/22 0120  WBC 7.7 7.1  RBC 4.26 3.98*  HGB 13.9 13.0  HCT 41.0 37.3*  MCV 96.2 93.7  MCH 32.6 32.7  MCHC 33.9 34.9  RDW 13.2 13.0  PLT 281 211   Thyroid No results for input(s): "TSH", "  FREET4" in the last 168 hours.  BNPNo results for input(s): "BNP", "PROBNP" in the last 168 hours.  DDimer No results for input(s): "DDIMER" in the last 168 hours.   Radiology    DG Chest 2 View  Result Date: 03/01/2022 CLINICAL DATA:  Chest pain. EXAM: CHEST - 2 VIEW COMPARISON:  Chest x-ray 04/08/2021 FINDINGS: The heart size and mediastinal contours are within normal limits. Both lungs are clear. The visualized skeletal structures are unremarkable. IMPRESSION: No active cardiopulmonary disease. Electronically Signed   By: Darliss Cheney M.D.   On: 03/01/2022 17:27    Cardiac Studies   Cath: 02/2021    Ost LAD to Prox LAD lesion is 99% stenosed.   Mid LAD lesion is 30% stenosed.   A drug-eluting stent was successfully placed using a STENT ONYX FRONTIER [id number].   Post intervention, there is a 0% residual stenosis.   LV end diastolic pressure is severely elevated.   Hemodynamic findings consistent with moderate pulmonary hypertension.   There is no aortic valve stenosis.   SUMMARY: Ischemic Cardiac Arrest-Ventricular Tachycardia-V. fib Severe single-vessel CAD with ostial LAD  95 to 99% thrombotic stenosis and roughly 30% mid LAD.  TIMI I flow Successful DES PCI of the ostial LAD with Onyx Frontier 3.0 mm x 18 mm postdilated to 3.3 mm -> reducing stenosis to 0% with TIMI-3 flow. ACUTE DIASTOLIC HEART FAILURE with opening LVEDP of 45 mmHg-PCWP post PCI and Lasix reduced to 28 mmHg. Moderate pulmonary hypertension with PA pressures 46/30 mmHg - mean 38 mmHg. Ao sat 98%, PA sat 76%. Cardiac Output-Index: (Fick) 5.96-2.96; (thermal) 5.55-2.76. Successful defibrillation x2-on 2 occasions crossing into the left ventricle, the patient decompensated into polymorphic VT on 1 occasion and VF on another.  Both successfully defibrillated, no CPR required.   Diagnostic Dominance: Right  Intervention    Patient Profile     66 y.o. male with a hx of uncontrolled HTN, longstanding heavy tobacco use, systolic HF, CAD with anterior STEMI in 02/2021, and VT/VF arrest at time of STEMI who was seen 03/01/2022 for the evaluation of chest pain at the request of Dr. Silverio Lay.  Assessment & Plan    NSTEMI CAD s/p PCI/DES ostial LAD 9/22 -- High-sensitivity troponin 813>> 3917.  Plan for cardiac catheterization today.  No recurrent chest pain overnight. -- Continue IV heparin, aspirin, Brilinta, high-dose statin, Coreg 6.25 mg twice daily, Entresto 24-26 twice daily, Aldactone 25 mg daily  HTN -- Stable -- Continue amlodipine 10 mg daily, Coreg 6.25 mg twice daily, Entresto 24-26 twice daily, Aldactone 25 mg daily  HLD -- LDL 108, HDL 42 -- Increase atorvastatin from 40 mg to 80 mg daily -- Will need LFT/FLP in 8 weeks  HFrEF ICM -- Initial LVEF 02/2021 at 20%, improved to 40% on echo 08/2021, grade 1 diastolic dysfunction, akinesis of the LV, anterior septal wall, normal RV size and function. -- Continue Coreg 6.25 mg twice daily, Entresto 24-26 twice daily, farxiga   Hypokalemia -- K+ 3.3, suppl -- recheck BMET in am  Reports he ran out of all his medications about 3 days prior  to admission. Will need to ensure he has medications sorted out at discharge, uninsured. Had been picking up medications from the HF clinic.   For questions or updates, please contact [facility] Please consult [email or web address] for contact info under        Signed, Laverda Page, NP  03/02/2022, 8:57 AM    I have personally seen and examined this  patient. I agree with the assessment and plan as outlined above.  He is not having chest pain today. Will plan cardiac cath later. Continue IV heparin while awaiting cath.   Lauree Chandler, MD, [facility] 03/02/2022 9:41 AM

## 2022-03-02 NOTE — Assessment & Plan Note (Signed)
.   Patient reports intermittent compliance . Resume patients home regimen of oral antihypertensives . Titrate antihypertensive regimen as necessary to achieve adequate BP control . PRN intravenous antihypertensives for excessively elevated blood pressure

## 2022-03-02 NOTE — Progress Notes (Addendum)
Dr. Hetty Ely paged per Dr. Elissa Hefty request to see pt at bedside awaiting call back from MD.  2030- Call back received from Dr. Hetty Ely  2040- Dr. Hetty Ely at bedside to evaluate pt. Will continue with Dr. Elissa Hefty orders for care for pt.

## 2022-03-02 NOTE — Progress Notes (Signed)
  Progress Note   Patient: Logan Brewer OVZ:[id number] DOB: [birth year] DOA: 03/01/2022     1 DOS: the patient was seen and examined on 03/02/2022   Brief hospital course: No notes on file  Assessment and Plan: * Non-ST elevation (NSTEMI) myocardial infarction [facility]) Patient presenting with typical chest pain and markedly elevated troponin concerning for NSTEMI Patient reports intermittent compliance with medications in the setting of known coronary disease and numerous risk factors Patient has been placed on a heparin infusion On antiplatelet therapy with aspirin and Brilinta Cardiology consulted. Plan for heart cath today Atorvastatin increased from 40mg   to 80mg    Coronary artery disease involving native coronary artery of native heart with angina pectoris (HCC) Please see assessment and plan above   Chronic systolic CHF (congestive heart failure) (HCC) No clinical evidence of cardiogenic volume overload Strict input and output monitoring Daily weights Low-sodium diet Cont Entresto and aldactone    Essential hypertension Patient reports intermittent compliance Resume patients home regimen of oral antihypertensives PRN intravenous antihypertensives for excessively elevated blood pressure      Prediabetes On SSI as needed A1c 6.2 Low carbohydrate diet once patient is no longer n.p.o.   Mixed hyperlipidemia Atorvastatin increased to 80mg  per above      Subjective: Seen this AM. Reports feeling tired and drowsy  Physical Exam: Vitals:   03/01/22 2249 03/01/22 2251 03/02/22 0430 03/02/22 0811  BP: (!) 153/82  138/66 137/67  Pulse: 62  85   Resp: 18  19 19   Temp:   97.7 F (36.5 C) 98.2 F (36.8 C)  TempSrc:   Oral Oral  SpO2: 99%  99% 97%  Weight:  83.2 kg    Height:       General exam: Asleep, laying in bed, in nad Respiratory system: Normal respiratory effort, no wheezing Cardiovascular system: regular rate, s1, s2 Gastrointestinal system: Soft,  nondistended, positive BS Central nervous system: CN2-12 grossly intact, strength intact Extremities: Perfused, no clubbing Skin: Normal skin turgor, no notable skin lesions seen Psychiatry: Mood normal // no visual hallucinations   Data Reviewed:  Labs reviewed: Na 137, K 3.3, Cr 1.19, Trop 813->3917->5268   Family Communication: Pt in room, family not at bedside  Disposition: Status is: Inpatient Remains inpatient appropriate because: Severity of illness  Planned Discharge Destination: Home    Author: 05/02/22, MD 03/02/2022 1:43 PM  For on call review www. .

## 2022-03-02 NOTE — Telephone Encounter (Addendum)
Advanced Heart Failure Patient Advocate Encounter  Sent Marcelline Deist, [place] and [place] apps to Greig Castilla [facility]) while patient is currently admitted. Patient will still need POI for Entresto. Patient aware.

## 2022-03-02 NOTE — Assessment & Plan Note (Signed)
   Resume home regimen of statin therapy which will be titrated upwards if LDL is above 70

## 2022-03-02 NOTE — Interval H&P Note (Signed)
History and Physical Interval Note:  03/02/2022 3:25 PM  Logan Brewer  has presented today for surgery, with the diagnosis of NSTEMI.  The various methods of treatment have been discussed with the patient and family. After consideration of risks, benefits and other options for treatment, the patient has consented to  Procedure(s): LEFT HEART CATH AND CORONARY ANGIOGRAPHY (N/A)  PERCUTANEOUS CORONARY INTERVENTION  as a surgical intervention.  The patient's history has been reviewed, patient examined, no change in status, stable for surgery.  I have reviewed the patient's chart and labs.  Questions were answered to the patient's satisfaction.    Cath Lab Visit (complete for each Cath Lab visit)  Clinical Evaluation Leading to the Procedure:   ACS: Yes.    Non-ACS:     Prior CABG: No previous CABG   Bryan Lemma

## 2022-03-02 NOTE — Progress Notes (Signed)
Cath lab nurse called re: K 3.3. Will replete with x1. Also noted to be starting spironolactone on MAR today so further plans for rx will be at discretion of rounding team.

## 2022-03-02 NOTE — Assessment & Plan Note (Addendum)
   Patient presenting with typical chest pain and markedly elevated troponin concerning for NSTEMI  Patient reports intermittent compliance with medications in the setting of known coronary disease and numerous risk factors  Cardiology has evaluated patient in consultation and their advice is appreciated  Patient has been placed on a heparin infusion  N.p.o. after midnight with plan for cardiac catheterization in the morning  Resuming antiplatelet therapy with aspirin and Brilinta  Resuming home regimen of statin therapy however this will be titrated upwards if LDL is above 70  Cycling cardiac enzymes to peak for now  Monitoring patient on telemetry  Sublingual nitroglycerin as needed for further episodes of chest discomfort

## 2022-03-02 NOTE — Progress Notes (Addendum)
Dr. Herbie Baltimore at bedside to evaluate hematoma post LHC to right ulnar. BP cuff at 75 mm Hg. Verbally told by MD to deflate TR band 2-3 cc every 10 minutes along with deflating BP cuff by 10 mm Hg every 10 minutes. If no complications follow protocol once all air is removed. Will continue to monitor site closely pt educated to call nurse for any assistance.

## 2022-03-02 NOTE — Progress Notes (Signed)
ANTICOAGULATION CONSULT NOTE  Pharmacy Consult for heparin Indication: chest pain/ACS  No Known Allergies  Patient Measurements: Height: 5\' 7"  (170.2 cm) Weight: 83.2 kg (183 lb 6.8 oz) IBW/kg (Calculated) : 66.1 Heparin Dosing Weight: 83kg  Vital Signs: Temp: 98.2 F (36.8 C) (09/12 0811) Temp Source: Oral (09/12 0811) BP: 137/67 (09/12 0811) Pulse Rate: 85 (09/12 0430)  Labs: Recent Labs    03/01/22 1708 03/01/22 2014 03/02/22 0120 03/02/22 0816  HGB 13.9  --  13.0  --   HCT 41.0  --  37.3*  --   PLT 281  --  211  --   HEPARINUNFRC  --   --  0.68 0.61  CREATININE 1.31*  --  1.19  --   TROPONINIHS 813* 3,917*  --   --      Estimated Creatinine Clearance: 64.7 mL/min (by C-G formula based on SCr of 1.19 mg/dL).   Medical History: Past Medical History:  Diagnosis Date   Hydrocele in adult    Hypertension    Inguinal hernia    Ischemic cardiomyopathy    Smoker 03/16/2021   smokes 1/2 ppd PTA per hx   STEMI involving left anterior descending coronary artery (HCC) 03/12/2021     Assessment: 39 YOM presenting with CP and SOB, he is not on anticoagulation PTA, CBC wnl. Plans noted for cath today. Pharmacy dosing heparin   -heparin level therapeutic at 0.61, CBC stable.  Goal of Therapy:  Heparin level 0.3-0.7 units/ml Monitor platelets by anticoagulation protocol: Yes   Plan:  Continue heparin 1100 units/h Will follow plans post cath  77, PharmD Clinical Pharmacist **Pharmacist phone directory can now be found on [email or web address] ([email or web address]).  Listed under [facility].

## 2022-03-02 NOTE — Assessment & Plan Note (Signed)
·   Please see assessment and plan above °

## 2022-03-02 NOTE — Progress Notes (Addendum)
Rounding Note    Patient Name: Logan Brewer Date of Encounter: 03/02/2022  [facility] Cardiologist: Bryan Lemma, MD   Subjective   No chest pain this morning. Reports he ran out of his medications about 3 days prior to admission.  Inpatient Medications    Scheduled Meds:  amLODipine  10 mg Oral Daily   aspirin EC  81 mg Oral Daily   atorvastatin  20 mg Oral Daily   carvedilol  6.25 mg Oral BID WC   insulin aspart  0-9 Units Subcutaneous TID AC & HS   sacubitril-valsartan  1 tablet Oral BID   sodium chloride flush  3 mL Intravenous Q12H   spironolactone  25 mg Oral Daily   ticagrelor  90 mg Oral BID   Continuous Infusions:  sodium chloride     sodium chloride 1 mL/kg/hr (03/02/22 0522)   heparin 1,100 Units/hr (03/02/22 0726)   PRN Meds: sodium chloride, acetaminophen **OR** acetaminophen, hydrALAZINE, nitroGLYCERIN, ondansetron **OR** ondansetron (ZOFRAN) IV, polyethylene glycol, sodium chloride flush   Vital Signs    Vitals:   03/01/22 2249 03/01/22 2251 03/02/22 0430 03/02/22 0811  BP: (!) 153/82  138/66 137/67  Pulse: 62  85   Resp: 18  19 19   Temp:   97.7 F (36.5 C) 98.2 F (36.8 C)  TempSrc:   Oral Oral  SpO2: 99%  99% 97%  Weight:  83.2 kg    Height:        Intake/Output Summary (Last 24 hours) at 03/02/2022 0857 Last data filed at 03/02/2022 0726 Gross per 24 hour  Intake 161.79 ml  Output --  Net 161.79 ml      03/01/2022   10:51 PM 03/01/2022    4:20 PM 12/15/2021   10:54 AM  Last 3 Weights  Weight (lbs) 183 lb 6.8 oz 187 lb 187 lb 6.4 oz  Weight (kg) 83.2 kg 84.823 kg 85.004 kg      Telemetry    Sinus Rhythm, PVCs - Personally Reviewed  ECG    No new tracing this morning  Physical Exam   GEN: No acute distress.   Neck: No JVD Cardiac: RRR, no murmurs, rubs, or gallops.  Respiratory: Clear to auscultation bilaterally. GI: Soft, nontender, non-distended  MS: No edema; No deformity. Neuro:  Nonfocal  Psych:  Normal affect   Labs    High Sensitivity Troponin:   Recent Labs  Lab 03/01/22 1708 03/01/22 2014  TROPONINIHS 813* 3,917*     Chemistry Recent Labs  Lab 03/01/22 1708 03/02/22 0120  NA 139 137  K 4.3 3.3*  CL 105 107  CO2 24 21*  GLUCOSE 101* 160*  BUN 15 13  CREATININE 1.31* 1.19  CALCIUM 9.3 9.0  MG  --  1.8  PROT  --  6.4*  ALBUMIN  --  3.2*  AST  --  53*  ALT  --  20  ALKPHOS  --  55  BILITOT  --  0.5  GFRNONAA >60 >60  ANIONGAP 10 9    Lipids  Recent Labs  Lab 03/02/22 0120  CHOL 157  TRIG 33  HDL 42  LDLCALC 108*  CHOLHDL 3.7    Hematology Recent Labs  Lab 03/01/22 1708 03/02/22 0120  WBC 7.7 7.1  RBC 4.26 3.98*  HGB 13.9 13.0  HCT 41.0 37.3*  MCV 96.2 93.7  MCH 32.6 32.7  MCHC 33.9 34.9  RDW 13.2 13.0  PLT 281 211   Thyroid No results for input(s): "TSH", "  FREET4" in the last 168 hours.  BNPNo results for input(s): "BNP", "PROBNP" in the last 168 hours.  DDimer No results for input(s): "DDIMER" in the last 168 hours.   Radiology    DG Chest 2 View  Result Date: 03/01/2022 CLINICAL DATA:  Chest pain. EXAM: CHEST - 2 VIEW COMPARISON:  Chest x-ray 04/08/2021 FINDINGS: The heart size and mediastinal contours are within normal limits. Both lungs are clear. The visualized skeletal structures are unremarkable. IMPRESSION: No active cardiopulmonary disease. Electronically Signed   By: Darliss Cheney M.D.   On: 03/01/2022 17:27    Cardiac Studies   Cath: 02/2021    Ost LAD to Prox LAD lesion is 99% stenosed.   Mid LAD lesion is 30% stenosed.   A drug-eluting stent was successfully placed using a STENT ONYX FRONTIER [id number].   Post intervention, there is a 0% residual stenosis.   LV end diastolic pressure is severely elevated.   Hemodynamic findings consistent with moderate pulmonary hypertension.   There is no aortic valve stenosis.   SUMMARY: Ischemic Cardiac Arrest-Ventricular Tachycardia-V. fib Severe single-vessel CAD with ostial LAD  95 to 99% thrombotic stenosis and roughly 30% mid LAD.  TIMI I flow Successful DES PCI of the ostial LAD with Onyx Frontier 3.0 mm x 18 mm postdilated to 3.3 mm -> reducing stenosis to 0% with TIMI-3 flow. ACUTE DIASTOLIC HEART FAILURE with opening LVEDP of 45 mmHg-PCWP post PCI and Lasix reduced to 28 mmHg. Moderate pulmonary hypertension with PA pressures 46/30 mmHg - mean 38 mmHg. Ao sat 98%, PA sat 76%. Cardiac Output-Index: (Fick) 5.96-2.96; (thermal) 5.55-2.76. Successful defibrillation x2-on 2 occasions crossing into the left ventricle, the patient decompensated into polymorphic VT on 1 occasion and VF on another.  Both successfully defibrillated, no CPR required.   Diagnostic Dominance: Right  Intervention    Patient Profile     66 y.o. male with a hx of uncontrolled HTN, longstanding heavy tobacco use, systolic HF, CAD with anterior STEMI in 02/2021, and VT/VF arrest at time of STEMI who was seen 03/01/2022 for the evaluation of chest pain at the request of Dr. Silverio Lay.  Assessment & Plan    NSTEMI CAD s/p PCI/DES ostial LAD 9/22 -- High-sensitivity troponin 813>> 3917.  Plan for cardiac catheterization today.  No recurrent chest pain overnight. -- Continue IV heparin, aspirin, Brilinta, high-dose statin, Coreg 6.25 mg twice daily, Entresto 24-26 twice daily, Aldactone 25 mg daily  HTN -- Stable -- Continue amlodipine 10 mg daily, Coreg 6.25 mg twice daily, Entresto 24-26 twice daily, Aldactone 25 mg daily  HLD -- LDL 108, HDL 42 -- Increase atorvastatin from 40 mg to 80 mg daily -- Will need LFT/FLP in 8 weeks  HFrEF ICM -- Initial LVEF 02/2021 at 20%, improved to 40% on echo 08/2021, grade 1 diastolic dysfunction, akinesis of the LV, anterior septal wall, normal RV size and function. -- Continue Coreg 6.25 mg twice daily, Entresto 24-26 twice daily, farxiga   Hypokalemia -- K+ 3.3, suppl -- recheck BMET in am  Reports he ran out of all his medications about 3 days prior  to admission. Will need to ensure he has medications sorted out at discharge, uninsured. Had been picking up medications from the HF clinic.   For questions or updates, please contact [facility] Please consult [email or web address] for contact info under        Signed, Laverda Page, NP  03/02/2022, 8:57 AM    I have personally seen and examined this  patient. I agree with the assessment and plan as outlined above.  He is not having chest pain today. Will plan cardiac cath later. Continue IV heparin while awaiting cath.   Lauree Chandler, MD, [facility] 03/02/2022 9:41 AM

## 2022-03-02 NOTE — Progress Notes (Signed)
ANTICOAGULATION CONSULT NOTE  Pharmacy Consult for heparin Indication: chest pain/ACS  No Known Allergies  Patient Measurements: Height: 5\' 7"  (170.2 cm) Weight: 84.8 kg (187 lb) IBW/kg (Calculated) : 66.1 Heparin Dosing Weight: 83kg  Vital Signs: Temp: 98 F (36.7 C) (09/11 2247) Temp Source: Oral (09/11 2247) BP: 153/82 (09/11 2249) Pulse Rate: 62 (09/11 2249)  Labs: Recent Labs    03/01/22 1708 03/01/22 2014 03/02/22 0120  HGB 13.9  --  13.0  HCT 41.0  --  37.3*  PLT 281  --  211  HEPARINUNFRC  --   --  0.68  CREATININE 1.31*  --   --   TROPONINIHS 813* 3,917*  --      Estimated Creatinine Clearance: 59.3 mL/min (A) (by C-G formula based on SCr of 1.31 mg/dL (H)).   Medical History: Past Medical History:  Diagnosis Date   Hydrocele in adult    Hypertension    Inguinal hernia    Ischemic cardiomyopathy    Smoker 03/16/2021   smokes 1/2 ppd PTA per hx   STEMI involving left anterior descending coronary artery (HCC) 03/12/2021     Assessment: 72 YOM presenting with CP and SOB, he is not on anticoagulation PTA, CBC wnl.  Initial heparin level therapeutic at 0.68, CBC stable.  Goal of Therapy:  Heparin level 0.3-0.7 units/ml Monitor platelets by anticoagulation protocol: Yes   Plan:  Continue heparin 1100 units/h Check confirmatory heparin level in 6h  77, PharmD, [place], [facility] Clinical Pharmacist Please check AMION for all [facility] numbers 03/02/2022

## 2022-03-02 NOTE — Assessment & Plan Note (Signed)
   To ensure patient maintains tight glycemic control performing Accu-Cheks before every meal and nightly with sliding scale insulin  Obtaining hemoglobin A1c  Low carbohydrate diet once patient is no longer n.p.o.

## 2022-03-02 NOTE — Assessment & Plan Note (Signed)
.   No clinical evidence of cardiogenic volume overload . Strict input and output monitoring . Daily weights . Low-sodium diet . We will resume home regimen of Entresto after the cardiac catheterization . We will resume home regimen of spironolactone

## 2022-03-03 ENCOUNTER — Other Ambulatory Visit (HOSPITAL_COMMUNITY): Payer: Self-pay

## 2022-03-03 LAB — COMPREHENSIVE METABOLIC PANEL
ALT: 19 U/L (ref 0–44)
AST: 32 U/L (ref 15–41)
Albumin: 3 g/dL — ABNORMAL LOW (ref 3.5–5.0)
Alkaline Phosphatase: 57 U/L (ref 38–126)
Anion gap: 6 (ref 5–15)
BUN: 11 mg/dL (ref 8–23)
CO2: 24 mmol/L (ref 22–32)
Calcium: 8.6 mg/dL — ABNORMAL LOW (ref 8.9–10.3)
Chloride: 108 mmol/L (ref 98–111)
Creatinine, Ser: 1.44 mg/dL — ABNORMAL HIGH (ref 0.61–1.24)
GFR, Estimated: 54 mL/min — ABNORMAL LOW (ref >60)
Glucose, Bld: 94 mg/dL (ref 70–99)
Potassium: 3.5 mmol/L (ref 3.5–5.1)
Sodium: 138 mmol/L (ref 135–145)
Total Bilirubin: 0.7 mg/dL (ref 0.3–1.2)
Total Protein: 6.3 g/dL — ABNORMAL LOW (ref 6.5–8.1)

## 2022-03-03 LAB — CBC
HCT: 40.2 % (ref 39.0–52.0)
Hemoglobin: 13.5 g/dL (ref 13.0–17.0)
MCH: 32.1 pg (ref 26.0–34.0)
MCHC: 33.6 g/dL (ref 30.0–36.0)
MCV: 95.5 fL (ref 80.0–100.0)
Platelets: 254 10*3/uL (ref 150–400)
RBC: 4.21 MIL/uL — ABNORMAL LOW (ref 4.22–5.81)
RDW: 13 % (ref 11.5–15.5)
WBC: 6 10*3/uL (ref 4.0–10.5)
nRBC: 0 % (ref 0.0–0.2)

## 2022-03-03 LAB — GLUCOSE, CAPILLARY
Glucose-Capillary: 110 mg/dL — ABNORMAL HIGH (ref 70–99)
Glucose-Capillary: 237 mg/dL — ABNORMAL HIGH (ref 70–99)

## 2022-03-03 LAB — TROPONIN I (HIGH SENSITIVITY): Troponin I (High Sensitivity): 813 ng/L (ref <18)

## 2022-03-03 MED ORDER — LOSARTAN POTASSIUM 25 MG PO TABS
25.0000 mg | ORAL_TABLET | Freq: Every day | ORAL | 0 refills | Status: DC
  Filled 2022-03-03: qty 30, 30d supply, fill #0

## 2022-03-03 MED ORDER — ASPIRIN 81 MG PO TBEC
81.0000 mg | DELAYED_RELEASE_TABLET | Freq: Every day | ORAL | 0 refills | Status: DC
  Filled 2022-03-03: qty 30, 30d supply, fill #0

## 2022-03-03 MED ORDER — ENTRESTO 24-26 MG PO TABS
1.0000 | ORAL_TABLET | Freq: Two times a day (BID) | ORAL | 0 refills | Status: DC
  Filled 2022-03-03: qty 60, 30d supply, fill #0

## 2022-03-03 MED ORDER — ATORVASTATIN CALCIUM 80 MG PO TABS
80.0000 mg | ORAL_TABLET | Freq: Every day | ORAL | 0 refills | Status: DC
  Filled 2022-03-03: qty 30, 30d supply, fill #0

## 2022-03-03 MED ORDER — TICAGRELOR 90 MG PO TABS
90.0000 mg | ORAL_TABLET | Freq: Two times a day (BID) | ORAL | 0 refills | Status: AC
  Filled 2022-03-03: qty 60, 30d supply, fill #0

## 2022-03-03 MED ORDER — NITROGLYCERIN 0.4 MG SL SUBL
0.4000 mg | SUBLINGUAL_TABLET | SUBLINGUAL | 0 refills | Status: DC | PRN
  Filled 2022-03-03: qty 25, 7d supply, fill #0

## 2022-03-03 NOTE — Plan of Care (Signed)
Problem: Education: Goal: Understanding of CV disease, CV risk reduction, and recovery process will improve 03/03/2022 1124 by Ancil Linsey, RN Outcome: Adequate for Discharge 03/03/2022 1124 by Ancil Linsey, RN Outcome: Adequate for Discharge Goal: Individualized Educational Video(s) 03/03/2022 1124 by Ancil Linsey, RN Outcome: Adequate for Discharge 03/03/2022 1124 by Ancil Linsey, RN Outcome: Adequate for Discharge   Problem: Activity: Goal: Ability to return to baseline activity level will improve 03/03/2022 1124 by Ancil Linsey, RN Outcome: Adequate for Discharge 03/03/2022 1124 by Ancil Linsey, RN Outcome: Adequate for Discharge   Problem: Cardiovascular: Goal: Ability to achieve and maintain adequate cardiovascular perfusion will improve 03/03/2022 1124 by Ancil Linsey, RN Outcome: Adequate for Discharge 03/03/2022 1124 by Ancil Linsey, RN Outcome: Adequate for Discharge Goal: Vascular access site(s) Level 0-1 will be maintained 03/03/2022 1124 by Ancil Linsey, RN Outcome: Adequate for Discharge 03/03/2022 1124 by Ancil Linsey, RN Outcome: Adequate for Discharge   Problem: Health Behavior/Discharge Planning: Goal: Ability to safely manage health-related needs after discharge will improve 03/03/2022 1124 by Ancil Linsey, RN Outcome: Adequate for Discharge 03/03/2022 1124 by Ancil Linsey, RN Outcome: Adequate for Discharge   Problem: Education: Goal: Ability to describe self-care measures that may prevent or decrease complications (Diabetes Survival Skills Education) will improve 03/03/2022 1124 by Ancil Linsey, RN Outcome: Adequate for Discharge 03/03/2022 1124 by Ancil Linsey, RN Outcome: Adequate for Discharge Goal: Individualized Educational Video(s) 03/03/2022 1124 by Ancil Linsey, RN Outcome: Adequate for Discharge 03/03/2022 1124 by Ancil Linsey, RN Outcome:  Adequate for Discharge   Problem: Coping: Goal: Ability to adjust to condition or change in health will improve 03/03/2022 1124 by Ancil Linsey, RN Outcome: Adequate for Discharge 03/03/2022 1124 by Ancil Linsey, RN Outcome: Adequate for Discharge   Problem: Fluid Volume: Goal: Ability to maintain a balanced intake and output will improve 03/03/2022 1124 by Ancil Linsey, RN Outcome: Adequate for Discharge 03/03/2022 1124 by Ancil Linsey, RN Outcome: Adequate for Discharge   Problem: Health Behavior/Discharge Planning: Goal: Ability to identify and utilize available resources and services will improve 03/03/2022 1124 by Ancil Linsey, RN Outcome: Adequate for Discharge 03/03/2022 1124 by Ancil Linsey, RN Outcome: Adequate for Discharge Goal: Ability to manage health-related needs will improve 03/03/2022 1124 by Ancil Linsey, RN Outcome: Adequate for Discharge 03/03/2022 1124 by Ancil Linsey, RN Outcome: Adequate for Discharge   Problem: Metabolic: Goal: Ability to maintain appropriate glucose levels will improve 03/03/2022 1124 by Ancil Linsey, RN Outcome: Adequate for Discharge 03/03/2022 1124 by Ancil Linsey, RN Outcome: Adequate for Discharge   Problem: Nutritional: Goal: Maintenance of adequate nutrition will improve 03/03/2022 1124 by Ancil Linsey, RN Outcome: Adequate for Discharge 03/03/2022 1124 by Ancil Linsey, RN Outcome: Adequate for Discharge Goal: Progress toward achieving an optimal weight will improve 03/03/2022 1124 by Ancil Linsey, RN Outcome: Adequate for Discharge 03/03/2022 1124 by Ancil Linsey, RN Outcome: Adequate for Discharge   Problem: Skin Integrity: Goal: Risk for impaired skin integrity will decrease 03/03/2022 1124 by Ancil Linsey, RN Outcome: Adequate for Discharge 03/03/2022 1124 by Ancil Linsey, RN Outcome: Adequate for Discharge    Problem: Tissue Perfusion: Goal: Adequacy of tissue perfusion will improve 03/03/2022 1124 by Ancil Linsey, RN Outcome: Adequate for Discharge 03/03/2022 1124 by Ancil Linsey, RN Outcome: Adequate for Discharge   Problem: Education: Goal: Knowledge of General  Education information will improve Description: Including pain rating scale, medication(s)/side effects and non-pharmacologic comfort measures 03/03/2022 1124 by Ancil Linsey, RN Outcome: Adequate for Discharge 03/03/2022 1124 by Ancil Linsey, RN Outcome: Adequate for Discharge   Problem: Health Behavior/Discharge Planning: Goal: Ability to manage health-related needs will improve 03/03/2022 1124 by Ancil Linsey, RN Outcome: Adequate for Discharge 03/03/2022 1124 by Ancil Linsey, RN Outcome: Adequate for Discharge   Problem: Clinical Measurements: Goal: Ability to maintain clinical measurements within normal limits will improve 03/03/2022 1124 by Ancil Linsey, RN Outcome: Adequate for Discharge 03/03/2022 1124 by Ancil Linsey, RN Outcome: Adequate for Discharge Goal: Will remain free from infection 03/03/2022 1124 by Ancil Linsey, RN Outcome: Adequate for Discharge 03/03/2022 1124 by Ancil Linsey, RN Outcome: Adequate for Discharge Goal: Diagnostic test results will improve 03/03/2022 1124 by Ancil Linsey, RN Outcome: Adequate for Discharge 03/03/2022 1124 by Ancil Linsey, RN Outcome: Adequate for Discharge Goal: Respiratory complications will improve 03/03/2022 1124 by Ancil Linsey, RN Outcome: Adequate for Discharge 03/03/2022 1124 by Ancil Linsey, RN Outcome: Adequate for Discharge Goal: Cardiovascular complication will be avoided 03/03/2022 1124 by Ancil Linsey, RN Outcome: Adequate for Discharge 03/03/2022 1124 by Ancil Linsey, RN Outcome: Adequate for Discharge   Problem: Activity: Goal: Risk for activity  intolerance will decrease 03/03/2022 1124 by Ancil Linsey, RN Outcome: Adequate for Discharge 03/03/2022 1124 by Ancil Linsey, RN Outcome: Adequate for Discharge   Problem: Nutrition: Goal: Adequate nutrition will be maintained 03/03/2022 1124 by Ancil Linsey, RN Outcome: Adequate for Discharge 03/03/2022 1124 by Ancil Linsey, RN Outcome: Adequate for Discharge   Problem: Coping: Goal: Level of anxiety will decrease 03/03/2022 1124 by Ancil Linsey, RN Outcome: Adequate for Discharge 03/03/2022 1124 by Ancil Linsey, RN Outcome: Adequate for Discharge   Problem: Elimination: Goal: Will not experience complications related to bowel motility 03/03/2022 1124 by Ancil Linsey, RN Outcome: Adequate for Discharge 03/03/2022 1124 by Ancil Linsey, RN Outcome: Adequate for Discharge Goal: Will not experience complications related to urinary retention 03/03/2022 1124 by Ancil Linsey, RN Outcome: Adequate for Discharge 03/03/2022 1124 by Ancil Linsey, RN Outcome: Adequate for Discharge   Problem: Pain Managment: Goal: General experience of comfort will improve 03/03/2022 1124 by Ancil Linsey, RN Outcome: Adequate for Discharge 03/03/2022 1124 by Ancil Linsey, RN Outcome: Adequate for Discharge   Problem: Safety: Goal: Ability to remain free from injury will improve 03/03/2022 1124 by Ancil Linsey, RN Outcome: Adequate for Discharge 03/03/2022 1124 by Ancil Linsey, RN Outcome: Adequate for Discharge   Problem: Skin Integrity: Goal: Risk for impaired skin integrity will decrease 03/03/2022 1124 by Ancil Linsey, RN Outcome: Adequate for Discharge 03/03/2022 1124 by Ancil Linsey, RN Outcome: Adequate for Discharge

## 2022-03-03 NOTE — Care Management (Addendum)
  Transition of Care [facility]) Screening Note   Patient Details  Name: Logan Brewer Date of Birth: Apr 15, 1956   Transition of Care [facility]) CM/SW Contact:    Gala Lewandowsky, RN Phone Number: 03/03/2022, 11:38 AM    Transition of Care Department [facility]) has reviewed the patient and no TOC needs have been identified at this time. Patient is without insurance at this time; however, he has a PCP. Case Manager called the [facility] and scheduled an appointment. Information was placed on the AVS. Case Manager will follow for cost of medications- he may be eligible for [facility] assistance. No further needs identified at this time.   03-03-22 1614 MATCH completed for this patient. Medications will be delivered to the bedside prior to transition home.

## 2022-03-03 NOTE — Progress Notes (Signed)
CARDIAC REHAB PHASE I   PRE:  Rate/Rhythm: 91/ NSR    BP: sitting 132/83    SaO2: 98% RA  MODE:  Ambulation: 250 ft   POST:  Rate/Rhythm:107/ST     BP: sitting 135/81     SaO2: 97% RA Patient ambulated 250 ft, tolerated well, denies SOB, dizziness. Patient educated on risk factors, exercise guidelines, angina, ntg, MI, PTCA, right radial catheterization site care, nutrition, congratulated pt on remaining smoke free for one year, encouraged him to keep up the good work, resources given to patient; cardiac rehab phase 2 discussed with patient, will refer to [facility] however patient expresses possible work conflicts. [id number] Newell Coral RN 03/03/2022 10:19 AM

## 2022-03-03 NOTE — Progress Notes (Signed)
EKG obtained as ordered this AM reading Acute MI/STEMI. Compared result to previous EKG unchanged and pt verbalizes no pain as well.

## 2022-03-03 NOTE — Plan of Care (Signed)
  Problem: Education: Goal: Understanding of CV disease, CV risk reduction, and recovery process will improve Outcome: Adequate for Discharge Goal: Individualized Educational Video(s) Outcome: Adequate for Discharge   Problem: Activity: Goal: Ability to return to baseline activity level will improve Outcome: Adequate for Discharge   Problem: Cardiovascular: Goal: Ability to achieve and maintain adequate cardiovascular perfusion will improve Outcome: Adequate for Discharge Goal: Vascular access site(s) Level 0-1 will be maintained Outcome: Adequate for Discharge   Problem: Health Behavior/Discharge Planning: Goal: Ability to safely manage health-related needs after discharge will improve Outcome: Adequate for Discharge   Problem: Education: Goal: Ability to describe self-care measures that may prevent or decrease complications (Diabetes Survival Skills Education) will improve Outcome: Adequate for Discharge Goal: Individualized Educational Video(s) Outcome: Adequate for Discharge   Problem: Coping: Goal: Ability to adjust to condition or change in health will improve Outcome: Adequate for Discharge   Problem: Fluid Volume: Goal: Ability to maintain a balanced intake and output will improve Outcome: Adequate for Discharge   Problem: Health Behavior/Discharge Planning: Goal: Ability to identify and utilize available resources and services will improve Outcome: Adequate for Discharge Goal: Ability to manage health-related needs will improve Outcome: Adequate for Discharge   Problem: Metabolic: Goal: Ability to maintain appropriate glucose levels will improve Outcome: Adequate for Discharge   Problem: Nutritional: Goal: Maintenance of adequate nutrition will improve Outcome: Adequate for Discharge Goal: Progress toward achieving an optimal weight will improve Outcome: Adequate for Discharge   Problem: Skin Integrity: Goal: Risk for impaired skin integrity will  decrease Outcome: Adequate for Discharge   Problem: Tissue Perfusion: Goal: Adequacy of tissue perfusion will improve Outcome: Adequate for Discharge   Problem: Education: Goal: Knowledge of General Education information will improve Description: Including pain rating scale, medication(s)/side effects and non-pharmacologic comfort measures Outcome: Adequate for Discharge   Problem: Health Behavior/Discharge Planning: Goal: Ability to manage health-related needs will improve Outcome: Adequate for Discharge   Problem: Clinical Measurements: Goal: Ability to maintain clinical measurements within normal limits will improve Outcome: Adequate for Discharge Goal: Will remain free from infection Outcome: Adequate for Discharge Goal: Diagnostic test results will improve Outcome: Adequate for Discharge Goal: Respiratory complications will improve Outcome: Adequate for Discharge Goal: Cardiovascular complication will be avoided Outcome: Adequate for Discharge   Problem: Activity: Goal: Risk for activity intolerance will decrease Outcome: Adequate for Discharge   Problem: Nutrition: Goal: Adequate nutrition will be maintained Outcome: Adequate for Discharge   Problem: Coping: Goal: Level of anxiety will decrease Outcome: Adequate for Discharge   Problem: Elimination: Goal: Will not experience complications related to bowel motility Outcome: Adequate for Discharge Goal: Will not experience complications related to urinary retention Outcome: Adequate for Discharge   Problem: Pain Managment: Goal: General experience of comfort will improve Outcome: Adequate for Discharge   Problem: Safety: Goal: Ability to remain free from injury will improve Outcome: Adequate for Discharge   Problem: Skin Integrity: Goal: Risk for impaired skin integrity will decrease Outcome: Adequate for Discharge   

## 2022-03-03 NOTE — Progress Notes (Signed)
Called received from Lab for critical troponin 813 that was drawn on 9/11. Pt has already received heart catheterization which occurred on 9/12 with interventions.

## 2022-03-03 NOTE — Plan of Care (Signed)
Problem: Education: Goal: Understanding of CV disease, CV risk reduction, and recovery process will improve 03/03/2022 1125 by Ancil Linsey, RN Outcome: Adequate for Discharge 03/03/2022 1124 by Ancil Linsey, RN Outcome: Adequate for Discharge 03/03/2022 1124 by Ancil Linsey, RN Outcome: Adequate for Discharge Goal: Individualized Educational Video(s) 03/03/2022 1125 by Ancil Linsey, RN Outcome: Adequate for Discharge 03/03/2022 1124 by Ancil Linsey, RN Outcome: Adequate for Discharge 03/03/2022 1124 by Ancil Linsey, RN Outcome: Adequate for Discharge   Problem: Activity: Goal: Ability to return to baseline activity level will improve 03/03/2022 1125 by Ancil Linsey, RN Outcome: Adequate for Discharge 03/03/2022 1124 by Ancil Linsey, RN Outcome: Adequate for Discharge 03/03/2022 1124 by Ancil Linsey, RN Outcome: Adequate for Discharge   Problem: Cardiovascular: Goal: Ability to achieve and maintain adequate cardiovascular perfusion will improve 03/03/2022 1125 by Ancil Linsey, RN Outcome: Adequate for Discharge 03/03/2022 1124 by Ancil Linsey, RN Outcome: Adequate for Discharge 03/03/2022 1124 by Ancil Linsey, RN Outcome: Adequate for Discharge Goal: Vascular access site(s) Level 0-1 will be maintained 03/03/2022 1125 by Ancil Linsey, RN Outcome: Adequate for Discharge 03/03/2022 1124 by Ancil Linsey, RN Outcome: Adequate for Discharge 03/03/2022 1124 by Ancil Linsey, RN Outcome: Adequate for Discharge   Problem: Health Behavior/Discharge Planning: Goal: Ability to safely manage health-related needs after discharge will improve 03/03/2022 1125 by Ancil Linsey, RN Outcome: Adequate for Discharge 03/03/2022 1124 by Ancil Linsey, RN Outcome: Adequate for Discharge 03/03/2022 1124 by Ancil Linsey, RN Outcome: Adequate for Discharge   Problem: Education: Goal:  Ability to describe self-care measures that may prevent or decrease complications (Diabetes Survival Skills Education) will improve 03/03/2022 1125 by Ancil Linsey, RN Outcome: Adequate for Discharge 03/03/2022 1124 by Ancil Linsey, RN Outcome: Adequate for Discharge 03/03/2022 1124 by Ancil Linsey, RN Outcome: Adequate for Discharge Goal: Individualized Educational Video(s) 03/03/2022 1125 by Ancil Linsey, RN Outcome: Adequate for Discharge 03/03/2022 1124 by Ancil Linsey, RN Outcome: Adequate for Discharge 03/03/2022 1124 by Ancil Linsey, RN Outcome: Adequate for Discharge   Problem: Coping: Goal: Ability to adjust to condition or change in health will improve 03/03/2022 1125 by Ancil Linsey, RN Outcome: Adequate for Discharge 03/03/2022 1124 by Ancil Linsey, RN Outcome: Adequate for Discharge 03/03/2022 1124 by Ancil Linsey, RN Outcome: Adequate for Discharge   Problem: Fluid Volume: Goal: Ability to maintain a balanced intake and output will improve 03/03/2022 1125 by Ancil Linsey, RN Outcome: Adequate for Discharge 03/03/2022 1124 by Ancil Linsey, RN Outcome: Adequate for Discharge 03/03/2022 1124 by Ancil Linsey, RN Outcome: Adequate for Discharge   Problem: Health Behavior/Discharge Planning: Goal: Ability to identify and utilize available resources and services will improve 03/03/2022 1125 by Ancil Linsey, RN Outcome: Adequate for Discharge 03/03/2022 1124 by Ancil Linsey, RN Outcome: Adequate for Discharge 03/03/2022 1124 by Ancil Linsey, RN Outcome: Adequate for Discharge Goal: Ability to manage health-related needs will improve 03/03/2022 1125 by Ancil Linsey, RN Outcome: Adequate for Discharge 03/03/2022 1124 by Ancil Linsey, RN Outcome: Adequate for Discharge 03/03/2022 1124 by Ancil Linsey, RN Outcome: Adequate for Discharge   Problem:  Metabolic: Goal: Ability to maintain appropriate glucose levels will improve 03/03/2022 1125 by Ancil Linsey, RN Outcome: Adequate for Discharge 03/03/2022 1124 by Ancil Linsey, RN Outcome: Adequate for Discharge 03/03/2022 1124 by Ancil Linsey, RN Outcome: Adequate  for Discharge   Problem: Nutritional: Goal: Maintenance of adequate nutrition will improve 03/03/2022 1125 by Ancil Linsey, RN Outcome: Adequate for Discharge 03/03/2022 1124 by Ancil Linsey, RN Outcome: Adequate for Discharge 03/03/2022 1124 by Ancil Linsey, RN Outcome: Adequate for Discharge Goal: Progress toward achieving an optimal weight will improve 03/03/2022 1125 by Ancil Linsey, RN Outcome: Adequate for Discharge 03/03/2022 1124 by Ancil Linsey, RN Outcome: Adequate for Discharge 03/03/2022 1124 by Ancil Linsey, RN Outcome: Adequate for Discharge   Problem: Skin Integrity: Goal: Risk for impaired skin integrity will decrease 03/03/2022 1125 by Ancil Linsey, RN Outcome: Adequate for Discharge 03/03/2022 1124 by Ancil Linsey, RN Outcome: Adequate for Discharge 03/03/2022 1124 by Ancil Linsey, RN Outcome: Adequate for Discharge   Problem: Tissue Perfusion: Goal: Adequacy of tissue perfusion will improve 03/03/2022 1125 by Ancil Linsey, RN Outcome: Adequate for Discharge 03/03/2022 1124 by Ancil Linsey, RN Outcome: Adequate for Discharge 03/03/2022 1124 by Ancil Linsey, RN Outcome: Adequate for Discharge   Problem: Education: Goal: Knowledge of General Education information will improve Description: Including pain rating scale, medication(s)/side effects and non-pharmacologic comfort measures 03/03/2022 1125 by Ancil Linsey, RN Outcome: Adequate for Discharge 03/03/2022 1124 by Ancil Linsey, RN Outcome: Adequate for Discharge 03/03/2022 1124 by Ancil Linsey, RN Outcome: Adequate for  Discharge   Problem: Health Behavior/Discharge Planning: Goal: Ability to manage health-related needs will improve 03/03/2022 1125 by Ancil Linsey, RN Outcome: Adequate for Discharge 03/03/2022 1124 by Ancil Linsey, RN Outcome: Adequate for Discharge 03/03/2022 1124 by Ancil Linsey, RN Outcome: Adequate for Discharge   Problem: Clinical Measurements: Goal: Ability to maintain clinical measurements within normal limits will improve 03/03/2022 1125 by Ancil Linsey, RN Outcome: Adequate for Discharge 03/03/2022 1124 by Ancil Linsey, RN Outcome: Adequate for Discharge 03/03/2022 1124 by Ancil Linsey, RN Outcome: Adequate for Discharge Goal: Will remain free from infection 03/03/2022 1125 by Ancil Linsey, RN Outcome: Adequate for Discharge 03/03/2022 1124 by Ancil Linsey, RN Outcome: Adequate for Discharge 03/03/2022 1124 by Ancil Linsey, RN Outcome: Adequate for Discharge Goal: Diagnostic test results will improve 03/03/2022 1125 by Ancil Linsey, RN Outcome: Adequate for Discharge 03/03/2022 1124 by Ancil Linsey, RN Outcome: Adequate for Discharge 03/03/2022 1124 by Ancil Linsey, RN Outcome: Adequate for Discharge Goal: Respiratory complications will improve 03/03/2022 1125 by Ancil Linsey, RN Outcome: Adequate for Discharge 03/03/2022 1124 by Ancil Linsey, RN Outcome: Adequate for Discharge 03/03/2022 1124 by Ancil Linsey, RN Outcome: Adequate for Discharge Goal: Cardiovascular complication will be avoided 03/03/2022 1125 by Ancil Linsey, RN Outcome: Adequate for Discharge 03/03/2022 1124 by Ancil Linsey, RN Outcome: Adequate for Discharge 03/03/2022 1124 by Ancil Linsey, RN Outcome: Adequate for Discharge   Problem: Activity: Goal: Risk for activity intolerance will decrease 03/03/2022 1125 by Ancil Linsey, RN Outcome: Adequate for Discharge 03/03/2022  1124 by Ancil Linsey, RN Outcome: Adequate for Discharge 03/03/2022 1124 by Ancil Linsey, RN Outcome: Adequate for Discharge   Problem: Nutrition: Goal: Adequate nutrition will be maintained 03/03/2022 1125 by Ancil Linsey, RN Outcome: Adequate for Discharge 03/03/2022 1124 by Ancil Linsey, RN Outcome: Adequate for Discharge 03/03/2022 1124 by Ancil Linsey, RN Outcome: Adequate for Discharge   Problem: Coping: Goal: Level of anxiety will decrease 03/03/2022 1125 by Ancil Linsey, RN Outcome: Adequate for Discharge 03/03/2022 1124 by Vinetta Bergamo,  Blane Ohara, RN Outcome: Adequate for Discharge 03/03/2022 1124 by Ancil Linsey, RN Outcome: Adequate for Discharge   Problem: Elimination: Goal: Will not experience complications related to bowel motility 03/03/2022 1125 by Ancil Linsey, RN Outcome: Adequate for Discharge 03/03/2022 1124 by Ancil Linsey, RN Outcome: Adequate for Discharge 03/03/2022 1124 by Ancil Linsey, RN Outcome: Adequate for Discharge Goal: Will not experience complications related to urinary retention 03/03/2022 1125 by Ancil Linsey, RN Outcome: Adequate for Discharge 03/03/2022 1124 by Ancil Linsey, RN Outcome: Adequate for Discharge 03/03/2022 1124 by Ancil Linsey, RN Outcome: Adequate for Discharge   Problem: Pain Managment: Goal: General experience of comfort will improve 03/03/2022 1125 by Ancil Linsey, RN Outcome: Adequate for Discharge 03/03/2022 1124 by Ancil Linsey, RN Outcome: Adequate for Discharge 03/03/2022 1124 by Ancil Linsey, RN Outcome: Adequate for Discharge   Problem: Safety: Goal: Ability to remain free from injury will improve 03/03/2022 1125 by Ancil Linsey, RN Outcome: Adequate for Discharge 03/03/2022 1124 by Ancil Linsey, RN Outcome: Adequate for Discharge 03/03/2022 1124 by Ancil Linsey, RN Outcome: Adequate for  Discharge   Problem: Skin Integrity: Goal: Risk for impaired skin integrity will decrease 03/03/2022 1125 by Ancil Linsey, RN Outcome: Adequate for Discharge 03/03/2022 1124 by Ancil Linsey, RN Outcome: Adequate for Discharge 03/03/2022 1124 by Ancil Linsey, RN Outcome: Adequate for Discharge

## 2022-03-03 NOTE — Progress Notes (Addendum)
Rounding Note    Patient Name: Logan Brewer Date of Encounter: 03/03/2022  [facility] Cardiologist: Bryan Lemma, MD   Subjective   Feeling well this morning. Just walked with cardiac rehab.   Inpatient Medications    Scheduled Meds:  amLODipine  10 mg Oral Daily   aspirin EC  81 mg Oral Daily   atorvastatin  80 mg Oral Daily   carvedilol  6.25 mg Oral BID WC   insulin aspart  0-9 Units Subcutaneous TID AC & HS   sacubitril-valsartan  1 tablet Oral BID   sodium chloride flush  3 mL Intravenous Q12H   sodium chloride flush  3 mL Intravenous Q12H   spironolactone  25 mg Oral Daily   ticagrelor  90 mg Oral BID   Continuous Infusions:  sodium chloride     PRN Meds: sodium chloride, acetaminophen **OR** acetaminophen, hydrALAZINE, nitroGLYCERIN, ondansetron **OR** ondansetron (ZOFRAN) IV, polyethylene glycol, sodium chloride flush   Vital Signs    Vitals:   03/03/22 0100 03/03/22 0200 03/03/22 0353 03/03/22 0800  BP: 107/68 (!) 106/54 121/81 (!) 151/73  Pulse: 61 61 65 72  Resp: 20 (!) 23 16 (!) 21  Temp:   98 F (36.7 C) 97.6 F (36.4 C)  TempSrc:   Oral Oral  SpO2: 96% 97% 99% 98%  Weight:      Height:        Intake/Output Summary (Last 24 hours) at 03/03/2022 1028 Last data filed at 03/03/2022 0600 Gross per 24 hour  Intake 1129.6 ml  Output 1600 ml  Net -470.4 ml      03/01/2022   10:51 PM 03/01/2022    4:20 PM 12/15/2021   10:54 AM  Last 3 Weights  Weight (lbs) 183 lb 6.8 oz 187 lb 187 lb 6.4 oz  Weight (kg) 83.2 kg 84.823 kg 85.004 kg      Telemetry    Sinus Rhythm - Personally Reviewed  ECG    Sinus Rhythm, deep TWI in anterior leads, prolonged QT (noted on post procedure EKG) - Personally Reviewed  Physical Exam   GEN: No acute distress.   Neck: No JVD Cardiac: RRR, no murmurs, rubs, or gallops.  Respiratory: Clear to auscultation bilaterally. GI: Soft, nontender, non-distended  MS: No edema; No deformity. Right ulnar  cath site stable.  Neuro:  Nonfocal  Psych: Normal affect   Labs    High Sensitivity Troponin:   Recent Labs  Lab 03/01/22 1708 03/01/22 2014 03/02/22 0816  TROPONINIHS 813* 3,917* 5,268*     Chemistry Recent Labs  Lab 03/01/22 1708 03/02/22 0120 03/03/22 0439  NA 139 137 138  K 4.3 3.3* 3.5  CL 105 107 108  CO2 24 21* 24  GLUCOSE 101* 160* 94  BUN 15 13 11   CREATININE 1.31* 1.19 1.44*  CALCIUM 9.3 9.0 8.6*  MG  --  1.8  --   PROT  --  6.4* 6.3*  ALBUMIN  --  3.2* 3.0*  AST  --  53* 32  ALT  --  20 19  ALKPHOS  --  55 57  BILITOT  --  0.5 0.7  GFRNONAA >60 >60 54*  ANIONGAP 10 9 6     Lipids  Recent Labs  Lab 03/02/22 0120  CHOL 157  TRIG 33  HDL 42  LDLCALC 108*  CHOLHDL 3.7    Hematology Recent Labs  Lab 03/01/22 1708 03/02/22 0120 03/03/22 0439  WBC 7.7 7.1 6.0  RBC 4.26 3.98* 4.21*  HGB 13.9 13.0 13.5  HCT 41.0 37.3* 40.2  MCV 96.2 93.7 95.5  MCH 32.6 32.7 32.1  MCHC 33.9 34.9 33.6  RDW 13.2 13.0 13.0  PLT 281 211 254   Thyroid No results for input(s): "TSH", "FREET4" in the last 168 hours.  BNPNo results for input(s): "BNP", "PROBNP" in the last 168 hours.  DDimer No results for input(s): "DDIMER" in the last 168 hours.   Radiology    CARDIAC CATHETERIZATION  Result Date: 03/02/2022   Mid LAD lesion is 30% stenosed.   Ost LAD to Prox LAD lesion is 90% stenosed.   1st Diag lesion is 65% stenosed.   Scoring balloon angioplasty was performed using a BALLN SCOREFLEX 3.0X15.   Post intervention, there is a 0% residual stenosis.   LV end diastolic pressure is moderately elevated.   There is no aortic valve stenosis. Severe 90% ISR of ostial LAD stent status post successful scoring balloon angioplasty dilated back up to 3.3 mm.  TIMI-3 flow pre and post. Otherwise stable coronary arteries. RECOMMENDATIONS Continue aggressive risk factor modification-needs smoking cessation counseling, high-dose statin, additional blood pressure control if necessary  (would not tolerate higher dose of carvedilol because of bradycardia) Would be okay for discharge in the morning Bryan Lemma, MD  DG Chest 2 View  Result Date: 03/01/2022 CLINICAL DATA:  Chest pain. EXAM: CHEST - 2 VIEW COMPARISON:  Chest x-ray 04/08/2021 FINDINGS: The heart size and mediastinal contours are within normal limits. Both lungs are clear. The visualized skeletal structures are unremarkable. IMPRESSION: No active cardiopulmonary disease. Electronically Signed   By: Darliss Cheney M.D.   On: 03/01/2022 17:27    Cardiac Studies   Cath: 03/02/22    Mid LAD lesion is 30% stenosed.   Ost LAD to Prox LAD lesion is 90% stenosed.   1st Diag lesion is 65% stenosed.   Scoring balloon angioplasty was performed using a BALLN SCOREFLEX 3.0X15.   Post intervention, there is a 0% residual stenosis.   LV end diastolic pressure is moderately elevated.   There is no aortic valve stenosis.   Severe 90% ISR of ostial LAD stent status post successful scoring balloon angioplasty dilated back up to 3.3 mm.  TIMI-3 flow pre and post. Otherwise stable coronary arteries.     RECOMMENDATIONS Continue aggressive risk factor modification-needs smoking cessation counseling, high-dose statin, additional blood pressure control if necessary (would not tolerate higher dose of carvedilol because of bradycardia) Would be okay for discharge in the morning  Bryan Lemma, MD  Diagnostic Dominance: Right  Intervention    Patient Profile     66 y.o. male  with a hx of uncontrolled HTN, longstanding heavy tobacco use, systolic HF, CAD with anterior STEMI in 02/2021, and VT/VF arrest at time of STEMI who was seen 03/01/2022 for the evaluation of chest pain at the request of Dr. Silverio Lay.  Assessment & Plan    NSTEMI CAD s/p PCI/DES ostial LAD 9/22 -- High-sensitivity troponin 813>> 3917.  Underwent cardiac catheterization 9/12 noted above with severe 90% in-stent restenosis of ostial LAD stent treated with  scoring balloon angioplasty. -- Continue  aspirin, Brilinta, high-dose statin, Coreg 6.25 mg twice daily, Entresto 24-26 twice daily, Aldactone 25 mg daily   HTN -- Stable -- Continue amlodipine 10 mg daily, Coreg 6.25 mg twice daily, Entresto 24-26 twice daily, Aldactone 25 mg daily   HLD -- LDL 108, HDL 42 -- Increased atorvastatin from 40 mg to 80 mg daily -- Will need LFT/FLP in  8 weeks   HFrEF ICM -- Initial LVEF 02/2021 at 20%, improved to 40% on echo 0000000, grade 1 diastolic dysfunction, akinesis of the LV, anterior septal wall, normal RV size and function. -- Continue Coreg 6.25 mg twice daily, Entresto 24-26 twice daily, farxiga    Hypokalemia -- improved K+ 3.5 this morning  Will arrange for outpatient follow up. He has been receiving his medications through medication assistance and the HF clinic. Recent fill of Brilinta (pharmD spoke to significant other to confirm) Stressed the importance of DAPT without missing doses  For questions or updates, please contact [place] Please consult [email or web address] for contact info under        Signed, Reino Bellis, NP  03/03/2022, 10:28 AM    I have personally seen and examined this patient. I agree with the assessment and plan as outlined above.  He is doing well today post PCI. No chest pain. OK to d/c home today on DAPT with ASA and Brilinta. We will arrange outpatient cardiac follow up.   Lauree Chandler, MD, [facility] 03/03/2022 11:13 AM

## 2022-03-04 LAB — LIPOPROTEIN A (LPA): Lipoprotein (a): 128.7 nmol/L — ABNORMAL HIGH (ref <75.0)

## 2022-03-04 NOTE — Discharge Summary (Signed)
Physician Discharge Summary   Patient: Logan Brewer MRN: [id number] DOB: 1955/11/02  Admit date:     03/01/2022  Discharge date: 03/03/2022  Discharge Physician: Briant Cedar   PCP: Grayce Sessions, NP   Recommendations at discharge:   Follow up with cardiology Follow-up with PCP  Discharge Diagnoses: Principal Problem:   Non-ST elevation (NSTEMI) myocardial infarction [facility]) Active Problems:   Coronary artery disease involving native coronary artery of native heart with angina pectoris (HCC)   Chronic systolic CHF (congestive heart failure) (HCC)   Essential hypertension   Prediabetes   Mixed hyperlipidemia    Hospital Course: 66 year old male with past medical history of hypertension, hyperlipidemia, prediabetes, nicotine dependence and Vfib arrest and coronary artery disease  (S/P STEMI 02/2021 needing cath with DES to ostial LAD) as well as ischemic cardiomyopathy (Echo 08/2021 EF 40% with G1DD) presenting to [facility] emergency department with complaints of chest pain.  In the ED, troponin peaked to 3917, cardiology consulted.  Admitted for further management.  Assessment and Plan:  NSTEMI History of CAD s/p DES in 9/22 Troponin peaked to 3917 Cardiac catheterization on 9/12 showed severe 90% in-stent restenosis of the ostial LAD stent treated with scoring balloon angioplasty Continue aspirin, Brilinta, statin, Coreg, Aldactone, losartan (patient unable to tolerate [facility]) Cardiology follow-up Advised and stressed the importance of medication compliance as well as appointment compliance, patient verbalized understanding  Hypertension Continue medicine as above  Chronic systolic and diastolic HF Echo showed improved EF to 40% on 3/23, grade 1 diastolic dysfunction Continue Coreg, losartan, Farxiga  Prediabetes A1c 6.2 Outpatient follow-up          Consultants: Cardiology Procedures performed: Cardiac cath Disposition: Home Diet  recommendation:  Cardiac and Carb modified diet DISCHARGE MEDICATION: Allergies as of 03/03/2022   No Known Allergies      Medication List     STOP taking these medications    Entresto 24-26 MG Generic drug: sacubitril-valsartan       TAKE these medications    amLODipine 10 MG tablet Commonly known as: NORVASC Take 1 tablet (10 mg total) by mouth daily.   Aspirin Low Dose 81 MG tablet Generic drug: aspirin EC Take 1 tablet (81 mg total) by mouth daily. Swallow whole.   atorvastatin 80 MG tablet Commonly known as: LIPITOR Take 1 tablet (80 mg total) by mouth daily. What changed:  medication strength how much to take   carvedilol 6.25 MG tablet Commonly known as: COREG Take 1 tablet (6.25 mg total) by mouth 2 (two) times daily with a meal.   losartan 25 MG tablet Commonly known as: Cozaar Take 1 tablet (25 mg total) by mouth daily.   nitroGLYCERIN 0.4 MG SL tablet Commonly known as: NITROSTAT Place 1 tablet (0.4 mg total) under the tongue every 5 (five) minutes as needed for chest pain.   spironolactone 25 MG tablet Commonly known as: ALDACTONE Take 1 tablet (25 mg total) by mouth daily.   ticagrelor 90 MG Tabs tablet Commonly known as: BRILINTA Take 1 tablet (90 mg total) by mouth 2 (two) times daily.        Follow-up Information     Grayce Sessions, NP. Schedule an appointment as soon as possible for a visit in 1 week(s).   Specialty: Internal Medicine Contact information: [address] [place] [address] [phone]         Marykay Lex, MD .   Specialty: Cardiology Contact information: [address]  [place] [address] [phone]         Bensimhon, Bevelyn Buckles, MD .   Specialty: Cardiology Contact information: [address] [place] [address] [phone]                Discharge Exam: Ceasar Mons Weights   03/01/22 1620 03/01/22 2251  Weight: 84.8 kg 83.2 kg    General: NAD  Cardiovascular: S1, S2 present Respiratory: CTAB Abdomen: Soft, nontender, nondistended, bowel sounds present Musculoskeletal: No bilateral pedal edema noted Skin: Normal Psychiatry: Normal mood   Condition at discharge: stable  The results of significant diagnostics from this hospitalization (including imaging, microbiology, ancillary and laboratory) are listed below for reference.   Imaging Studies: CARDIAC CATHETERIZATION  Result Date: 03/02/2022   Mid LAD lesion is 30% stenosed.   Ost LAD to Prox LAD lesion is 90% stenosed.   1st Diag lesion is 65% stenosed.   Scoring balloon angioplasty was performed using a BALLN SCOREFLEX 3.0X15.   Post intervention, there is a 0% residual stenosis.   LV end diastolic pressure is moderately elevated.   There is no aortic valve stenosis. Severe 90% ISR of ostial LAD stent status post successful scoring balloon angioplasty dilated back up to 3.3 mm.  TIMI-3 flow pre and post. Otherwise stable coronary arteries. RECOMMENDATIONS Continue aggressive risk factor modification-needs smoking cessation counseling, high-dose statin, additional blood pressure control if necessary (would not tolerate higher dose of carvedilol because of bradycardia) Would be okay for discharge in the morning Bryan Lemma, MD  DG Chest 2 View  Result Date: 03/01/2022 CLINICAL DATA:  Chest pain. EXAM: CHEST - 2 VIEW COMPARISON:  Chest x-ray 04/08/2021 FINDINGS: The heart size and mediastinal contours are within normal limits. Both lungs are clear. The visualized skeletal structures are unremarkable. IMPRESSION: No active cardiopulmonary disease. Electronically Signed   By: Darliss Cheney M.D.   On: 03/01/2022 17:27    Microbiology: Results for orders placed or performed during the hospital encounter of 04/08/21  Resp Panel by RT-PCR (Flu A&B, Covid) Nasopharyngeal Swab     Status: None   Collection Time: 04/08/21 10:08 PM   Specimen: Nasopharyngeal Swab;  Nasopharyngeal(NP) swabs in vial transport medium  Result Value Ref Range Status   SARS Coronavirus 2 by RT PCR NEGATIVE NEGATIVE Final    Comment: (NOTE) SARS-CoV-2 target nucleic acids are NOT DETECTED.  The SARS-CoV-2 RNA is generally detectable in upper respiratory specimens during the acute phase of infection. The lowest concentration of SARS-CoV-2 viral copies this assay can detect is 138 copies/mL. A negative result does not preclude SARS-Cov-2 infection and should not be used as the sole basis for treatment or other patient management decisions. A negative result may occur with  improper specimen collection/handling, submission of specimen other than nasopharyngeal swab, presence of viral mutation(s) within the areas targeted by this assay, and inadequate number of viral copies(<138 copies/mL). A negative result must be combined with clinical observations, patient history, and epidemiological information. The expected result is Negative.  Fact Sheet for Patients:  BloggerCourse.com  Fact Sheet for Healthcare Providers:  SeriousBroker.it  This test is no t yet approved or cleared by the [place] FDA and  has been authorized for detection and/or diagnosis of SARS-CoV-2 by FDA under an Emergency Use Authorization (EUA). This EUA will remain  in effect (meaning this test can be used) for the duration of the COVID-19 declaration under Section 564(b)(1) of the Act, 21 U.S.C.section 360bbb-3(b)(1), unless the authorization is terminated  or revoked sooner.       Influenza A by PCR NEGATIVE NEGATIVE Final   Influenza B by PCR NEGATIVE NEGATIVE Final    Comment: (NOTE) The Xpert Xpress SARS-CoV-2/FLU/RSV plus assay is intended as an aid in the diagnosis of influenza from Nasopharyngeal swab specimens and should not be used as a sole basis for treatment. Nasal washings and aspirates are unacceptable for Xpert Xpress  SARS-CoV-2/FLU/RSV testing.  Fact Sheet for Patients: EntrepreneurPulse.com.au  Fact Sheet for Healthcare Providers: IncredibleEmployment.be  This test is not yet approved or cleared by the [place] FDA and has been authorized for detection and/or diagnosis of SARS-CoV-2 by FDA under an Emergency Use Authorization (EUA). This EUA will remain in effect (meaning this test can be used) for the duration of the COVID-19 declaration under Section 564(b)(1) of the Act, 21 U.S.C. section 360bbb-3(b)(1), unless the authorization is terminated or revoked.  Performed at [facility], [place] [address], [place] [address]     Labs: CBC: Recent Labs  Lab 03/01/22 1708 03/02/22 0120 03/03/22 0439  WBC 7.7 7.1 6.0  NEUTROABS  --  3.2  --   HGB 13.9 13.0 13.5  HCT 41.0 37.3* 40.2  MCV 96.2 93.7 95.5  PLT 281 211 0000000   Basic Metabolic Panel: Recent Labs  Lab 03/01/22 1708 03/02/22 0120 03/03/22 0439  NA 139 137 138  K 4.3 3.3* 3.5  CL 105 107 108  CO2 24 21* 24  GLUCOSE 101* 160* 94  BUN 15 13 11   CREATININE 1.31* 1.19 1.44*  CALCIUM 9.3 9.0 8.6*  MG  --  1.8  --    Liver Function Tests: Recent Labs  Lab 03/02/22 0120 03/03/22 0439  AST 53* 32  ALT 20 19  ALKPHOS 55 57  BILITOT 0.5 0.7  PROT 6.4* 6.3*  ALBUMIN 3.2* 3.0*   CBG: Recent Labs  Lab 03/02/22 0623 03/02/22 1152 03/02/22 2136 03/03/22 0808 03/03/22 1151  GLUCAP 89 118* 168* 110* 237*    Discharge time spent: greater than 30 minutes.  Signed: Alma Friendly, MD [facility] 03/04/2022

## 2022-03-05 NOTE — Telephone Encounter (Signed)
Advanced Heart Failure Patient Advocate Encounter  Sent in [place] application to [employer] via fax. Will follow up. Document scanned to chart.

## 2022-03-09 ENCOUNTER — Other Ambulatory Visit (HOSPITAL_COMMUNITY): Payer: Self-pay

## 2022-03-10 NOTE — Progress Notes (Signed)
Cardiology Clinic Note   Patient Name: Logan Brewer Date of Encounter: 03/12/2022  Primary Care Provider:  Kerin Perna, NP Primary Cardiologist:  Glenetta Hew, MD  Patient Profile    Logan Brewer 66 year old male presents to the clinic today for follow-up evaluation of his coronary artery disease and chronic systolic CHF.  Past Medical History    Past Medical History:  Diagnosis Date   Hydrocele in adult    Hypertension    Inguinal hernia    Ischemic cardiomyopathy    Smoker 03/16/2021   smokes 1/2 ppd PTA per hx   STEMI involving left anterior descending coronary artery ([place]) 03/12/2021   Past Surgical History:  Procedure Laterality Date   APPENDECTOMY     CORONARY STENT INTERVENTION N/A 03/11/2021   Procedure: CORONARY STENT INTERVENTION;  Surgeon: Leonie Man, MD;  Location: [place] [facility];  Service: Cardiovascular;  Laterality: N/A;   CORONARY STENT INTERVENTION N/A 03/02/2022   Procedure: CORONARY STENT INTERVENTION;  Surgeon: Leonie Man, MD;  Location: [place] [facility];  Service: Cardiovascular;  Laterality: N/A;   CORONARY/GRAFT ACUTE MI REVASCULARIZATION N/A 03/11/2021   Procedure: Coronary/Graft Acute MI Revascularization;  Surgeon: Leonie Man, MD;  Location: [place] [facility];  Service: Cardiovascular;  Laterality: N/A;   HYDROCELE EXCISION Bilateral 11/21/2019   Procedure: HYDROCELECTOMY ADULT;  Surgeon: Cleon Gustin, MD;  Location: [facility];  Service: Urology;  Laterality: [id number]   HYDROCELE EXCISION Right 04/07/2020   Procedure: RIGHT HYDROCELECTOMY;  Surgeon: Cleon Gustin, MD;  Location: [facility];  Service: Urology;  Laterality: Right;   INGUINAL HERNIA REPAIR Left 11/21/2019   Procedure: HERNIA REPAIR INGUINAL ADULT;  Surgeon: Aviva Signs, MD;  Location: [facility];  Service: General;  Laterality: [id number]   LEFT HEART CATH AND CORONARY ANGIOGRAPHY N/A 03/02/2022   Procedure: LEFT HEART CATH AND  CORONARY ANGIOGRAPHY;  Surgeon: Leonie Man, MD;  Location: [place] [facility];  Service: Cardiovascular;  Laterality: N/A;   RIGHT/LEFT HEART CATH AND CORONARY ANGIOGRAPHY N/A 03/11/2021   Procedure: RIGHT/LEFT HEART CATH AND CORONARY ANGIOGRAPHY;  Surgeon: Leonie Man, MD;  Location: [facility] [facility];  Service: Cardiovascular;  Laterality: N/A;    Allergies  No Known Allergies  History of Present Illness    [name] is a PMH of HTN, atrial fibrillation, coronary artery disease, NSTEMI, chronic systolic CHF, anxiety, left inguinal hernia, metabolic acidosis, nicotine dependence, and prediabetes.  He was noted to have anterior STEMI [id number] which was complicated by VT/VF.  During initial cath he was noted to have ostial LAD-proximal LAD 99% stenosis, mid LAD lesion 30% stenosis and received PCI with DES x1.  He was admitted to the hospital on 03/01/2022 and discharged on 03/03/2022.  He presented with complaints of chest pain.  His troponins peaked at 3917.  Cardiology was consulted.  He underwent cardiac catheterization in the setting of STEMI.  He received PCI with scoring balloon angioplasty for 90% in-stent restenosis to his ostial LAD.  His echocardiogram showed ischemic cardiomyopathy with with an EF of 40% and G1 DD.  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  He is having right forearm pain today.  He is noted to have right forearm ecchymosis.  Pulses palpable.  I will order a arterial Doppler to evaluate.  We will give him a work note that states he is not able to lift x1 week.  I will continue his current medication regimen and plan follow-up in 1 week.  Today he denies chest pain, shortness of breath, lower extremity edema, fatigue, palpitations, melena, hematuria, hemoptysis, diaphoresis, weakness, presyncope, syncope, orthopnea,  and PND.   Home Medications    Prior to Admission medications   Medication Sig Start Date End Date Taking? Authorizing Provider  amLODipine (NORVASC) 10 MG tablet Take 1 tablet (10 mg total) by mouth daily. 09/17/21   Bensimhon, Shaune Pascal, MD  aspirin EC 81 MG tablet Take 1 tablet (81 mg total) by mouth daily. Swallow whole. 03/03/22 04/02/22  Alma Friendly, MD  atorvastatin (LIPITOR) 80 MG tablet Take 1 tablet (80 mg total) by mouth daily. 03/03/22 04/02/22  Alma Friendly, MD  carvedilol (COREG) 6.25 MG tablet Take 1 tablet (6.25 mg total) by mouth 2 (two) times daily with a meal. 09/17/21   Bensimhon, Shaune Pascal, MD  losartan (COZAAR) 25 MG tablet Take 1 tablet (25 mg total) by mouth daily. 03/03/22 04/02/22  Alma Friendly, MD  nitroGLYCERIN (NITROSTAT) 0.4 MG SL tablet Place 1 tablet (0.4 mg total) under the tongue every 5 (five) minutes as needed for chest pain. 03/03/22 04/02/22  Alma Friendly, MD  spironolactone (ALDACTONE) 25 MG tablet Take 1 tablet (25 mg total) by mouth daily. 09/17/21   Bensimhon, Shaune Pascal, MD  ticagrelor (BRILINTA) 90 MG TABS tablet Take 1 tablet (90 mg total) by mouth 2 (two) times daily. 03/03/22 04/02/22  Alma Friendly, MD    Family History    Family History  Problem Relation Age of Onset   Heart disease Neg Hx    He indicated that the status of his neg hx is unknown.  Social History    Social History   Socioeconomic History   Marital status: Single    Spouse name: Not on file   Number of children: Not on file   Years of education: Not on file   Highest education level: Not on file  Occupational History   Not on file  Tobacco Use   Smoking status: Former    Packs/day: 0.50    Types: Cigarettes   Smokeless tobacco: Never  Vaping Use   Vaping Use: Never used  Substance and Sexual Activity   Alcohol use: No   Drug use: Yes    Types: Marijuana    Comment: occasionally    Sexual activity: Not on file  Other Topics  Concern   Not on file  Social History Narrative   ** Merged History Encounter **       Social Determinants of Health   Financial Resource Strain: Low Risk  (09/17/2021)   Overall Financial Resource Strain (CARDIA)    Difficulty of Paying Living Expenses: Not very hard  Food Insecurity: No Food Insecurity (09/17/2021)   Hunger Vital Sign    Worried About Running Out of Food in the Last Year: Never true    Ran Out of Food in the Last Year: Never true  Transportation Needs: No Transportation Needs (09/17/2021)   PRAPARE - Hydrologist (Medical): No    Lack of Transportation (Non-Medical): No  Physical Activity: Not on file  Stress: Not on file  Social Connections: Not on file  Intimate Partner Violence: Not on file     Review of Systems    General:  No chills, fever, night  sweats or weight changes.  Cardiovascular:  No chest pain, dyspnea on exertion, edema, orthopnea, palpitations, paroxysmal nocturnal dyspnea. Dermatological: No rash, lesions/masses Respiratory: No cough, dyspnea Urologic: No hematuria, dysuria Abdominal:   No nausea, vomiting, diarrhea, bright red blood per rectum, melena, or hematemesis Neurologic:  No visual changes, wkns, changes in mental status. All other systems reviewed and are otherwise negative except as noted above.  Physical Exam    VS:  BP 128/72   Pulse 73   Ht 5\' 6"  (1.676 m)   Wt 186 lb 9.6 oz (84.6 kg)   SpO2 99%   BMI 30.12 kg/m  , BMI Body mass index is 30.12 kg/m. GEN: Well nourished, well developed, in no acute distress. HEENT: normal. Neck: Supple, no JVD, carotid bruits, or masses. Cardiac: RRR, no murmurs, rubs, or gallops. No clubbing, cyanosis, edema.  Radials/DP/PT 2+ and equal bilaterally.  Respiratory:  Respirations regular and unlabored, clear to auscultation bilaterally. GI: Soft, nontender, nondistended, BS + x 4. MS: no deformity or atrophy. Skin: warm and dry, no rash.  Ecchymosis right  forearm.  Cath site healing well no signs of infection. Neuro:  Strength and sensation are intact. Psych: Normal affect.  Accessory Clinical Findings    Recent Labs: 09/17/2021: B Natriuretic Peptide 55.2 03/02/2022: Magnesium 1.8 03/03/2022: ALT 19; BUN 11; Creatinine, Ser 1.44; Hemoglobin 13.5; Platelets 254; Potassium 3.5; Sodium 138   Recent Lipid Panel    Component Value Date/Time   CHOL 157 03/02/2022 0120   CHOL 204 (H) 11/04/2020 1029   TRIG 33 03/02/2022 0120   HDL 42 03/02/2022 0120   HDL 52 11/04/2020 1029   CHOLHDL 3.7 03/02/2022 0120   VLDL 7 03/02/2022 0120   LDLCALC 108 (H) 03/02/2022 0120   LDLCALC 131 (H) 11/04/2020 1029         ECG personally reviewed by me today-normal sinus rhythm septal infarct undetermined age 56 bpm- No acute changes  Echocardiogram 09/17/2021 IMPRESSIONS     1. Left ventricular ejection fraction, by estimation, is 40%. The left  ventricle has normal function. The left ventricle demonstrates regional  wall motion abnormalities (see scoring diagram/findings for description).  Left ventricular diastolic  parameters are consistent with Grade I diastolic dysfunction (impaired  relaxation). There is akinesis of the left ventricular, entire  anteroseptal wall.   2. Right ventricular systolic function is normal. The right ventricular  size is normal.   3. The mitral valve is normal in structure. Trivial mitral valve  regurgitation. No evidence of mitral stenosis.   4. The aortic valve is tricuspid. There is mild calcification of the  aortic valve. Aortic valve regurgitation is not visualized. No aortic  stenosis is present.   5. The inferior vena cava is normal in size with greater than 50%  respiratory variability, suggesting right atrial pressure of 3 mmHg.   Cardiac catheterization 03/02/2022    Mid LAD lesion is 30% stenosed.   Ost LAD to Prox LAD lesion is 90% stenosed.   1st Diag lesion is 65% stenosed.   Scoring balloon  angioplasty was performed using a BALLN SCOREFLEX 3.0X15.   Post intervention, there is a 0% residual stenosis.   LV end diastolic pressure is moderately elevated.   There is no aortic valve stenosis.   Severe 90% ISR of ostial LAD stent status post successful scoring balloon angioplasty dilated back up to 3.3 mm.  TIMI-3 flow pre and post. Otherwise stable coronary arteries.     RECOMMENDATIONS Continue aggressive risk factor  modification-needs smoking cessation counseling, high-dose statin, additional blood pressure control if necessary (would not tolerate higher dose of carvedilol because of bradycardia) Would be okay for discharge in the morning       Glenetta Hew, MD  Diagnostic Dominance: Right  Intervention    Assessment & Plan   1.  Coronary artery disease-no chest pain today.  Underwent cardiac catheterization on 03/02/2022 and was noted to have in-stent restenosis of his ostial LAD.  He underwent angioplasty with scoring balloon. Continue aspirin, Brilinta, atorvastatin, carvedilol, Aldactone, losartan Heart healthy low-sodium diet-salty 6 given Increase physical activity as tolerated  Right lower arm pain-no bruit.  Reports pain.  He has been lifting 5 gallon buckets of paint.  It appears to be musculoskeletal in nature but will get ultrasound to rule out arterial involvement. Upper arterial doppler No lifting for one week work note given Right forearm ecchymosis Cool compress as needed for upper extremity discomfort  Hyperlipidemia-LDL  Continue atorvastatin, aspirin Heart healthy low-sodium heart fiber diet Increase physical activity as tolerated Fasting lipids and LFTs in 4-6 weeks  Essential hypertension-BP today128/72 Continue carvedilol, Aldactone, losartan Heart healthy low-sodium diet-salty 6 given Increase physical activity as tolerated  Chronic systolic and diastolic CHF-euvolemic today.  Weight stable.  No increased DOE or activity intolerance.   Echocardiogram showed an improved EF to 40%, 3/23 with G1 DD Continue carvedilol, losartan, Farxiga Heart healthy low-sodium diet-salty 6 given Increase physical activity as tolerated  Disposition: Follow-up with Dr. Ellyn Hack in 3-4 months.   Jossie Ng. Yekaterina Escutia NP-C     03/12/2022, 2:51 PM [facility] [address] Office [phone] Fax [phone]  Notice: This dictation was prepared with Dragon dictation along with smaller phrase technology. Any transcriptional errors that result from this process are unintentional and may not be corrected upon review.  I spent 14 minutes examining this patient, reviewing medications, and using patient centered shared decision making involving her cardiac care.  Prior to her visit I spent greater than 20 minutes reviewing her past medical history,  medications, and prior cardiac tests.

## 2022-03-12 ENCOUNTER — Encounter: Payer: Self-pay | Admitting: General Practice

## 2022-03-12 ENCOUNTER — Ambulatory Visit: Payer: MEDICAID | Attending: General Practice | Admitting: General Practice

## 2022-03-12 VITALS — BP 128/72 | HR 73 | Ht 66.0 in | Wt 186.6 lb

## 2022-03-12 DIAGNOSIS — M79601 Pain in right arm: Secondary | ICD-10-CM

## 2022-03-12 DIAGNOSIS — I25119 Atherosclerotic heart disease of native coronary artery with unspecified angina pectoris: Secondary | ICD-10-CM

## 2022-03-12 DIAGNOSIS — I10 Essential (primary) hypertension: Secondary | ICD-10-CM

## 2022-03-12 DIAGNOSIS — E782 Mixed hyperlipidemia: Secondary | ICD-10-CM

## 2022-03-12 DIAGNOSIS — I5022 Chronic systolic (congestive) heart failure: Secondary | ICD-10-CM

## 2022-03-12 NOTE — Patient Instructions (Signed)
Medication Instructions:  The current medical regimen is effective;  continue present plan and medications as directed. Please refer to the Current Medication list given to you today.  *If you need a refill on your cardiac medications before your next appointment, please call your pharmacy*  Lab Work: FASTING LIPID AND LFT IN 4-6 WEEKS (9-20 THRU 10-3) If you have labs (blood work) drawn today and your tests are completely normal, you will receive your results only by: [place] (if you have MyChart) OR A paper copy in the mail  If you have any lab test that is abnormal or we need to change your treatment, we will call you to review the results.  Testing/Procedures: UPPER EXTREMITY ULTRASOUND-HERE IN OUR OFFICE  NO LIFTING FOR 1 WEEK UNTIL 03-19-2022  Follow-Up: At [facility], you and your health needs are our priority.  As part of our continuing mission to provide you with exceptional heart care, we have created designated Provider Care Teams.  These Care Teams include your primary Cardiologist (physician) and Advanced Practice Providers (APPs -  Physician Assistants and Nurse Practitioners) who all work together to provide you with the care you need, when you need it.  We recommend signing up for the patient portal called "MyChart".  Sign up information is provided on this After Visit Summary.  MyChart is used to connect with patients for Virtual Visits (Telemedicine).  Patients are able to view lab/test results, encounter notes, upcoming appointments, etc.  Non-urgent messages can be sent to your provider as well.   To learn more about what you can do with MyChart, go to [email or web address].    Your next appointment:   1 week(s)  The format for your next appointment:   In Person  Provider:   Coletta Memos, FNP-C   Important Information About Sugar

## 2022-03-15 ENCOUNTER — Other Ambulatory Visit: Payer: Self-pay | Admitting: General Practice

## 2022-03-15 DIAGNOSIS — M79601 Pain in right arm: Secondary | ICD-10-CM

## 2022-03-16 ENCOUNTER — Other Ambulatory Visit (HOSPITAL_COMMUNITY): Payer: Self-pay

## 2022-03-16 NOTE — Progress Notes (Deleted)
Cardiology Clinic Note   Patient Name: Logan Brewer Date of Encounter: 03/16/2022  Primary Care Provider:  Kerin Perna, NP Primary Cardiologist:  Glenetta Hew, MD  Patient Profile    Logan Brewer 66 year old male presents to the clinic today for follow-up evaluation of his coronary artery disease and chronic systolic CHF.  Past Medical History    Past Medical History:  Diagnosis Date   Hydrocele in adult    Hypertension    Inguinal hernia    Ischemic cardiomyopathy    Smoker 03/16/2021   smokes 1/2 ppd PTA per hx   STEMI involving left anterior descending coronary artery ([place]) 03/12/2021   Past Surgical History:  Procedure Laterality Date   APPENDECTOMY     CORONARY STENT INTERVENTION N/A 03/11/2021   Procedure: CORONARY STENT INTERVENTION;  Surgeon: Leonie Man, MD;  Location: [facility];  Service: Cardiovascular;  Laterality: N/A;   CORONARY STENT INTERVENTION N/A 03/02/2022   Procedure: CORONARY STENT INTERVENTION;  Surgeon: Leonie Man, MD;  Location: [facility];  Service: Cardiovascular;  Laterality: N/A;   CORONARY/GRAFT ACUTE MI REVASCULARIZATION N/A 03/11/2021   Procedure: Coronary/Graft Acute MI Revascularization;  Surgeon: Leonie Man, MD;  Location: [facility];  Service: Cardiovascular;  Laterality: N/A;   HYDROCELE EXCISION Bilateral 11/21/2019   Procedure: HYDROCELECTOMY ADULT;  Surgeon: Cleon Gustin, MD;  Location: [facility];  Service: Urology;  Laterality: Bilateral;  [id number]   HYDROCELE EXCISION Right 04/07/2020   Procedure: RIGHT HYDROCELECTOMY;  Surgeon: Cleon Gustin, MD;  Location: [facility];  Service: Urology;  Laterality: Right;   INGUINAL HERNIA REPAIR Left 11/21/2019   Procedure: HERNIA REPAIR INGUINAL ADULT;  Surgeon: Aviva Signs, MD;  Location: [facility];  Service: General;  Laterality: Left;  [id number]   LEFT HEART CATH AND CORONARY ANGIOGRAPHY N/A 03/02/2022   Procedure: LEFT HEART CATH AND  CORONARY ANGIOGRAPHY;  Surgeon: Leonie Man, MD;  Location: [facility];  Service: Cardiovascular;  Laterality: N/A;   RIGHT/LEFT HEART CATH AND CORONARY ANGIOGRAPHY N/A 03/11/2021   Procedure: RIGHT/LEFT HEART CATH AND CORONARY ANGIOGRAPHY;  Surgeon: Leonie Man, MD;  Location: [facility];  Service: Cardiovascular;  Laterality: N/A;    Allergies  No Known Allergies  History of Present Illness    Logan Brewer is a PMH of HTN, atrial fibrillation, coronary artery disease, NSTEMI, chronic systolic CHF, anxiety, left inguinal hernia, metabolic acidosis, nicotine dependence, and prediabetes.  He was noted to have anterior STEMI 0/64 which was complicated by VT/VF.  During initial cath he was noted to have ostial LAD-proximal LAD 99% stenosis, mid LAD lesion 30% stenosis and received PCI with DES x1.  He was admitted to the hospital on 03/01/2022 and discharged on 03/03/2022.  He presented with complaints of chest pain.  His troponins peaked at 3917.  Cardiology was consulted.  He underwent cardiac catheterization in the setting of STEMI.  He received PCI with scoring balloon angioplasty for 90% in-stent restenosis to his ostial LAD.  His echocardiogram showed ischemic cardiomyopathy with with an EF of 40% and G1 DD.  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  He was having right forearm pain today.  He was noted to have right forearm ecchymosis.  Pulses palpable.  I  ordered a arterial Doppler to evaluate.  We  gave him a work note that stated he was not able to lift x1 week.  I continued his current medication regimen and plan follow-up in 1 week.  He has not yet completed upper extremity arterial Doppler.  He presents to the clinic today for follow-up evaluation and states***  Today he denies chest pain, shortness of breath,  lower extremity edema, fatigue, palpitations, melena, hematuria, hemoptysis, diaphoresis, weakness, presyncope, syncope, orthopnea, and PND.   Home Medications    Prior to Admission medications   Medication Sig Start Date End Date Taking? Authorizing Provider  amLODipine (NORVASC) 10 MG tablet Take 1 tablet (10 mg total) by mouth daily. 09/17/21   Bensimhon, Shaune Pascal, MD  aspirin EC 81 MG tablet Take 1 tablet (81 mg total) by mouth daily. Swallow whole. 03/03/22 04/02/22  Alma Friendly, MD  atorvastatin (LIPITOR) 80 MG tablet Take 1 tablet (80 mg total) by mouth daily. 03/03/22 04/02/22  Alma Friendly, MD  carvedilol (COREG) 6.25 MG tablet Take 1 tablet (6.25 mg total) by mouth 2 (two) times daily with a meal. 09/17/21   Bensimhon, Shaune Pascal, MD  losartan (COZAAR) 25 MG tablet Take 1 tablet (25 mg total) by mouth daily. 03/03/22 04/02/22  Alma Friendly, MD  nitroGLYCERIN (NITROSTAT) 0.4 MG SL tablet Place 1 tablet (0.4 mg total) under the tongue every 5 (five) minutes as needed for chest pain. 03/03/22 04/02/22  Alma Friendly, MD  spironolactone (ALDACTONE) 25 MG tablet Take 1 tablet (25 mg total) by mouth daily. 09/17/21   Bensimhon, Shaune Pascal, MD  ticagrelor (BRILINTA) 90 MG TABS tablet Take 1 tablet (90 mg total) by mouth 2 (two) times daily. 03/03/22 04/02/22  Alma Friendly, MD    Family History    Family History  Problem Relation Age of Onset   Heart disease Neg Hx    He indicated that the status of his neg hx is unknown.  Social History    Social History   Socioeconomic History   Marital status: Single    Spouse name: Not on file   Number of children: Not on file   Years of education: Not on file   Highest education level: Not on file  Occupational History   Not on file  Tobacco Use   Smoking status: Former    Packs/day: 0.50    Types: Cigarettes   Smokeless tobacco: Never  Vaping Use   Vaping Use: Never used  Substance and Sexual Activity    Alcohol use: No   Drug use: Yes    Types: Marijuana    Comment: occasionally    Sexual activity: Not on file  Other Topics Concern   Not on file  Social History Narrative   ** Merged History Encounter **       Social Determinants of Health   Financial Resource Strain: Low Risk  (09/17/2021)   Overall Financial Resource Strain (CARDIA)    Difficulty of Paying Living Expenses: Not very hard  Food Insecurity: No Food Insecurity (09/17/2021)   Hunger Vital Sign    Worried About Running Out of Food in the Last Year: Never true    Ran Out of Food in the Last Year: Never true  Transportation Needs: No Transportation Needs (09/17/2021)   PRAPARE - Hydrologist (Medical): No    Lack of Transportation (Non-Medical): No  Physical Activity: Not on file  Stress: Not on file  Social Connections: Not on file  Intimate  Partner Violence: Not on file     Review of Systems    General:  No chills, fever, night sweats or weight changes.  Cardiovascular:  No chest pain, dyspnea on exertion, edema, orthopnea, palpitations, paroxysmal nocturnal dyspnea. Dermatological: No rash, lesions/masses Respiratory: No cough, dyspnea Urologic: No hematuria, dysuria Abdominal:   No nausea, vomiting, diarrhea, bright red blood per rectum, melena, or hematemesis Neurologic:  No visual changes, wkns, changes in mental status. All other systems reviewed and are otherwise negative except as noted above.  Physical Exam    VS:  There were no vitals taken for this visit. , BMI There is no height or weight on file to calculate BMI. GEN: Well nourished, well developed, in no acute distress. HEENT: normal. Neck: Supple, no JVD, carotid bruits, or masses. Cardiac: RRR, no murmurs, rubs, or gallops. No clubbing, cyanosis, edema.  Radials/DP/PT 2+ and equal bilaterally.  Respiratory:  Respirations regular and unlabored, clear to auscultation bilaterally. GI: Soft, nontender, nondistended, BS  + x 4. MS: no deformity or atrophy. Skin: warm and dry, no rash.  Ecchymosis right forearm.  Cath site healing well no signs of infection. Neuro:  Strength and sensation are intact. Psych: Normal affect.  Accessory Clinical Findings    Recent Labs: 09/17/2021: B Natriuretic Peptide 55.2 03/02/2022: Magnesium 1.8 03/03/2022: ALT 19; BUN 11; Creatinine, Ser 1.44; Hemoglobin 13.5; Platelets 254; Potassium 3.5; Sodium 138   Recent Lipid Panel    Component Value Date/Time   CHOL 157 03/02/2022 0120   CHOL 204 (H) 11/04/2020 1029   TRIG 33 03/02/2022 0120   HDL 42 03/02/2022 0120   HDL 52 11/04/2020 1029   CHOLHDL 3.7 03/02/2022 0120   VLDL 7 03/02/2022 0120   LDLCALC 108 (H) 03/02/2022 0120   LDLCALC 131 (H) 11/04/2020 1029    No BP recorded.  {Refresh Note OR Click here to enter BP  :1}***    ECG personally reviewed by me today-*** EKG 03/12/22 normal sinus rhythm septal infarct undetermined age 39 bpm- No acute changes  Echocardiogram 09/17/2021 IMPRESSIONS     1. Left ventricular ejection fraction, by estimation, is 40%. The left  ventricle has normal function. The left ventricle demonstrates regional  wall motion abnormalities (see scoring diagram/findings for description).  Left ventricular diastolic  parameters are consistent with Grade I diastolic dysfunction (impaired  relaxation). There is akinesis of the left ventricular, entire  anteroseptal wall.   2. Right ventricular systolic function is normal. The right ventricular  size is normal.   3. The mitral valve is normal in structure. Trivial mitral valve  regurgitation. No evidence of mitral stenosis.   4. The aortic valve is tricuspid. There is mild calcification of the  aortic valve. Aortic valve regurgitation is not visualized. No aortic  stenosis is present.   5. The inferior vena cava is normal in size with greater than 50%  respiratory variability, suggesting right atrial pressure of 3 mmHg.   Cardiac  catheterization 03/02/2022    Mid LAD lesion is 30% stenosed.   Ost LAD to Prox LAD lesion is 90% stenosed.   1st Diag lesion is 65% stenosed.   Scoring balloon angioplasty was performed using a BALLN SCOREFLEX 3.0X15.   Post intervention, there is a 0% residual stenosis.   LV end diastolic pressure is moderately elevated.   There is no aortic valve stenosis.   Severe 90% ISR of ostial LAD stent status post successful scoring balloon angioplasty dilated back up to 3.3 mm.  TIMI-3  flow pre and post. Otherwise stable coronary arteries.     RECOMMENDATIONS Continue aggressive risk factor modification-needs smoking cessation counseling, high-dose statin, additional blood pressure control if necessary (would not tolerate higher dose of carvedilol because of bradycardia) Would be okay for discharge in the morning       Glenetta Hew, MD  Diagnostic Dominance: Right  Intervention    Assessment & Plan   1.  Right lower arm pain-no bruit.  Reports pain.  He has been lifting 5 gallon buckets of paint.  It appears to be musculoskeletal in nature but will get ultrasound to rule out arterial involvement.  Upper extremity arterial ultrasound scheduled for 04/07/2022. Continue to refrain from lifting until after upper extremity ultrasound. May return to work and do light duty.  Coronary artery disease-no chest pain today.  Underwent cardiac catheterization on 03/02/2022 and was noted to have in-stent restenosis of his ostial LAD.  He underwent angioplasty with scoring balloon. Continue aspirin, Brilinta, atorvastatin, carvedilol, Aldactone, losartan Heart healthy low-sodium diet-salty 6 given Increase physical activity as tolerated  Hyperlipidemia-LDL *** Continue atorvastatin, aspirin Heart healthy low-sodium heart fiber diet Increase physical activity as tolerated Fasting lipids and LFTs in 4-6 weeks  Essential hypertension-BP today128/72*** Continue carvedilol, Aldactone,  losartan Heart healthy low-sodium diet-salty 6 reviewed Increase physical activity as tolerated  Chronic systolic and diastolic CHF-continues to be euvolemic today.  Weight stable.  No increased DOE or activity intolerance.  Echocardiogram showed an improved EF to 40%, 3/23 with G1 DD Continue carvedilol, losartan, Farxiga Heart healthy low-sodium die Increase physical activity as tolerated  Disposition: Follow-up with Dr. Ellyn Hack in 3-4 months.   Jossie Ng. Monchel Pollitt NP-C     03/16/2022, 11:26 AM [address] Office [phone] Fax [phone]  Notice: This dictation was prepared with Dragon dictation along with smaller phrase technology. Any transcriptional errors that result from this process are unintentional and may not be corrected upon review.  I spent 14*** minutes examining this patient, reviewing medications, and using patient centered shared decision making involving her cardiac care.  Prior to her visit I spent greater than 20 minutes reviewing her past medical history,  medications, and prior cardiac tests.

## 2022-03-18 ENCOUNTER — Ambulatory Visit (HOSPITAL_COMMUNITY)
Admission: RE | Admit: 2022-03-18 | Discharge: 2022-03-18 | Disposition: A | Discharge status: Home / Self Care | Payer: Self-pay | Source: Ambulatory Visit | Attending: Cardiovascular Disease | Admitting: Cardiovascular Disease

## 2022-03-18 ENCOUNTER — Encounter (HOSPITAL_COMMUNITY): Payer: Self-pay

## 2022-03-18 DIAGNOSIS — M79601 Pain in right arm: Secondary | ICD-10-CM | POA: Insufficient documentation

## 2022-03-19 ENCOUNTER — Ambulatory Visit: Payer: MEDICAID | Attending: General Practice | Admitting: General Practice

## 2022-03-22 NOTE — Telephone Encounter (Signed)
Advanced Heart Failure Patient Advocate Encounter  [employer] would like for the patient to apply for and be denied Medicaid before they are willing to finish processing the application. A letter regarding the patient's insurance status has been sent on 09/29.  Will follow up.

## 2022-03-24 ENCOUNTER — Other Ambulatory Visit: Payer: Self-pay

## 2022-03-24 ENCOUNTER — Encounter (INDEPENDENT_AMBULATORY_CARE_PROVIDER_SITE_OTHER): Payer: Self-pay | Admitting: Primary Care

## 2022-03-24 ENCOUNTER — Ambulatory Visit (INDEPENDENT_AMBULATORY_CARE_PROVIDER_SITE_OTHER): Payer: Self-pay | Admitting: Primary Care

## 2022-03-24 VITALS — BP 148/74 | HR 64 | Resp 16 | Wt 185.0 lb

## 2022-03-24 DIAGNOSIS — Z09 Encounter for follow-up examination after completed treatment for conditions other than malignant neoplasm: Secondary | ICD-10-CM

## 2022-03-24 DIAGNOSIS — E6609 Other obesity due to excess calories: Secondary | ICD-10-CM

## 2022-03-24 DIAGNOSIS — Z0000 Encounter for general adult medical examination without abnormal findings: Secondary | ICD-10-CM

## 2022-03-24 DIAGNOSIS — Z6830 Body mass index (BMI) 30.0-30.9, adult: Secondary | ICD-10-CM

## 2022-03-24 DIAGNOSIS — R7303 Prediabetes: Secondary | ICD-10-CM

## 2022-03-24 MED ORDER — EMPAGLIFLOZIN 10 MG PO TABS
10.0000 mg | ORAL_TABLET | Freq: Every day | ORAL | 1 refills | Status: DC
  Filled 2022-03-24: qty 90, 90d supply, fill #0

## 2022-03-24 NOTE — Progress Notes (Signed)
[facility]   Subjective:  Mr. Logan Brewer is a 66 y.o. male presents for hospital follow up and establish care.  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  Patient describes this chest discomfort as pressure-like in quality, midsternal in location and severe in intensity.  Chest pain persisted for approximately 1 hour.  Chest discomfort was nonradiating.  Admit date to the hospital was 03/01/22, patient was discharged from the hospital on 03/03/22, patient was admitted for: Cardiac arrest Logan Brewer).Patient has No headache, No chest pain, No abdominal pain - No Nausea, No new weakness tingling or numbness, No Cough - shortness of breath   Past Medical History:  Diagnosis Date   Hydrocele in adult    Hypertension    Inguinal hernia    Ischemic cardiomyopathy    Smoker 03/16/2021   smokes 1/2 ppd PTA per hx   STEMI involving left anterior descending coronary artery (Logan Brewer) 03/12/2021     No Known Allergies    Current Outpatient Medications on File Prior to Visit  Medication Sig Dispense Refill   amLODipine (NORVASC) 10 MG tablet Take 1 tablet (10 mg total) by mouth daily. 90 tablet 3   aspirin EC 81 MG tablet Take 1 tablet (81 mg total) by mouth daily. Swallow whole. 30 tablet 0   atorvastatin (LIPITOR) 80 MG tablet Take 1 tablet (80 mg total) by mouth daily. 30 tablet 0   carvedilol (COREG) 6.25 MG tablet Take 1 tablet (6.25 mg total) by mouth 2 (two) times daily with a meal. 180 tablet 3   losartan (COZAAR) 25 MG tablet Take 1 tablet (25 mg total) by mouth daily. 30 tablet 0   nitroGLYCERIN (NITROSTAT) 0.4 MG SL tablet Place 1 tablet (0.4 mg total) under the tongue every 5 (five) minutes as needed for chest pain. 75 tablet 0   spironolactone (ALDACTONE) 25 MG tablet Take 1 tablet (25 mg total) by mouth daily.  90 tablet 3   ticagrelor (BRILINTA) 90 MG TABS tablet Take 1 tablet (90 mg total) by mouth 2 (two) times daily. 60 tablet 0   No current facility-administered medications on file prior to visit.     Review of System: Comprehensive ROS Pertinent positive and negative noted in HPI    Objective:  BP (!) 148/74   Pulse 64   Resp 16   Wt 185 lb (83.9 kg)   SpO2 99%   BMI 29.86 kg/m   Filed Weights   03/24/22 1106  Weight: 185 lb (83.9 kg)    Physical Exam: General Appearance: Well nourished, obese male in no apparent distress. Eyes: PERRLA, EOMs, conjunctiva no swelling or erythema Sinuses: No Frontal/maxillary tenderness ENT/Mouth: Ext aud canals clear, TMs without erythema, bulging. No erythema, swelling, or exudate on post pharynx.  Tonsils not swollen or erythematous. Hearing normal.  Neck: Supple, thyroid normal.  Respiratory: Respiratory effort normal, BS equal bilaterally without rales, rhonchi, wheezing or stridor.  Cardio: RRR with no MRGs. Brisk peripheral pulses without edema.  Abdomen: Soft, + BS.  Non tender, no guarding, rebound, hernias, masses. Lymphatics: Non tender without lymphadenopathy.  Musculoskeletal: Full ROM, 5/5 strength, normal gait.  Skin: Warm, dry without rashes, lesions, ecchymosis.  Neuro: Cranial nerves intact. Normal muscle tone, no cerebellar symptoms. Sensation intact.  Psych: Awake and oriented X 3, normal affect, Insight and Judgment  appropriate.    Assessment:  Logan Brewer was seen today for hospitalization follow-up.  Diagnoses and all orders for this visit:  Healthcare maintenance -     HCV Ab w Reflex to Quant PCR  Prediabetes A1C 6.2  Dual purpose contolling prediabetes and reduces CVD risk -     empagliflozin (JARDIANCE) 10 MG TABS tablet; Take 1 tablet (10 mg total) by mouth daily before breakfast.  Class 1 obesity due to excess calories with serious comorbidity and body mass index (BMI) of 30.0 to 30.9 in adult Obesity is  30-39 indicating an excess in caloric intake or underlining conditions. This may had lead to HTN, Prediabetes . Lifestyle modifications of diet and exercise may reduce obesity.    Other orders -     Interpretation:   Hospital discharge follow-up Schedule an appointment with Logan Sessions, NP (Internal Medicine) in 1 week (03/10/2022) Follow up with Logan Lex, MD (Cardiology) Follow up with Logan, Bevelyn Buckles, MD (Cardiology)  This note has been created with Dragon speech recognition software and smart phrase technology. Any transcriptional errors are unintentional.   Logan Sessions, NP 03/24/2022, 11:22 AM

## 2022-03-24 NOTE — Patient Instructions (Signed)
Empagliflozin Tablets What is this medication? EMPAGLIFLOZIN (EM pa gli FLOE zin) treats type 2 diabetes. It works by helping your kidneys remove sugar (glucose) from your blood through the urine, which decreases your blood sugar. It can also be used to lower the risk of heart attack, stroke, and hospitalization for heart failure in people with type 2 diabetes. Changes to diet and exercise are often combined with this medication. This medicine may be used for other purposes; ask your health care provider or pharmacist if you have questions. COMMON BRAND NAME(S): Jardiance What should I tell my care team before I take this medication? They need to know if you have any of these conditions: Dehydration Diabetic ketoacidosis Diet low in salt Eating less due to illness, surgery, dieting, or any other reason Frequently drink alcohol Having surgery High cholesterol High levels of potassium in the blood History of pancreatitis or pancreas problems History of yeast infection of the penis or vagina Infections in the bladder, kidneys, or urinary tract Kidney disease Liver disease Low blood pressure On hemodialysis Problems urinating Type 1 diabetes Uncircumcised male An unusual or allergic reaction to empagliflozin, other medications, foods, dyes, or preservatives Pregnant or trying to get pregnant Breast-feeding How should I use this medication? Take this medication by mouth with water. Take it as directed on the prescription label at the same time every day. You may take it with or without food. Keep taking it unless your care team tells you to stop. A special MedGuide will be given to you by the pharmacist with each prescription and refill. Be sure to read this information carefully each time. Talk to your care team about the use of this medication in children. Special care may be needed. Overdosage: If you think you have taken too much of this medicine contact a poison control center or  emergency room at once. NOTE: This medicine is only for you. Do not share this medicine with others. What if I miss a dose? If you miss a dose, take it as soon as you can. If it is almost time for your next dose, take only that dose. Do not take double or extra doses. What may interact with this medication? Alcohol Diuretics Insulin Lithium This list may not describe all possible interactions. Give your health care provider a list of all the medicines, herbs, non-prescription drugs, or dietary supplements you use. Also tell them if you smoke, drink alcohol, or use illegal drugs. Some items may interact with your medicine. What should I watch for while using this medication? Visit your care team for regular checks on your progress. Tell your care team if your symptoms do not start to get better or if they get worse. This medication can cause a serious condition in which there is too much acid in the blood. If you develop nausea, vomiting, stomach pain, unusual tiredness, or breathing problems, stop taking this medication and call your care team right away. If possible, use a ketone dipstick to check for ketones in your urine. Check with your care team if you have severe diarrhea, nausea, and vomiting, or if you sweat a lot. The loss of too much body fluid may make it dangerous for you to take this medication. A test called the HbA1C (A1C) will be monitored. This is a simple blood test. It measures your blood sugar control over the last 2 to 3 months. You will receive this test every 3 to 6 months. Learn how to check your blood sugar. Learn   the symptoms of low and high blood sugar and how to manage them. Always carry a quick-source of sugar with you in case you have symptoms of low blood sugar. Examples include hard sugar candy or glucose tablets. Make sure others know that you can choke if you eat or drink when you develop serious symptoms of low blood sugar, such as seizures or unconsciousness. Get  medical help at once. Tell your care team if you have high blood sugar. You might need to change the dose of your medication. If you are sick or exercising more than usual, you may need to change the dose of your medication. What side effects may I notice from receiving this medication? Side effects that you should report to your care team as soon as possible: Allergic reactions--skin rash, itching, hives, swelling of the face, lips, tongue, or throat Dehydration--increased thirst, dry mouth, feeling faint or lightheaded, headache, dark yellow or brown urine Diabetic ketoacidosis (DKA)--increased thirst or amount of urine, dry mouth, fatigue, fruity odor to breath, trouble breathing, stomach pain, nausea, vomiting Genital yeast infection--redness, swelling, pain, or itchiness, odor, thick or lumpy discharge New pain or tenderness, change in skin color, sores or ulcers, infection of the leg or foot Infection or redness, swelling, tenderness, or pain in the genitals, or area from the genitals to the back of the rectum Urinary tract infection (UTI)--burning when passing urine, passing frequent small amounts of urine, bloody or cloudy urine, pain in the lower back or sides This list may not describe all possible side effects. Call your doctor for medical advice about side effects. You may report side effects to FDA at 1-800-FDA-1088. Where should I keep my medication? Keep out of the reach of children and pets. Store at room temperature between 20 and 25 degrees C (68 and 77 degrees F). Get rid of any unused medication after the expiration date. To get rid of medications that are no longer needed or have expired: Take the medication to a medication take-back program. Check with your pharmacy or law enforcement to find a location. If you cannot return the medication, check the label or package insert to see if the medication should be thrown out in the garbage or flushed down the toilet. If you are not  sure, ask your care team. If it is safe to put it in the trash, take the medication out of the container. Mix the medication with cat litter, dirt, coffee grounds, or other unwanted substance. Seal the mixture in a bag or container. Put it in the trash. NOTE: This sheet is a summary. It may not cover all possible information. If you have questions about this medicine, talk to your doctor, pharmacist, or health care provider.  2023 Elsevier/Gold Standard (2020-09-11 00:00:00)  

## 2022-03-25 ENCOUNTER — Telehealth (INDEPENDENT_AMBULATORY_CARE_PROVIDER_SITE_OTHER): Payer: Self-pay

## 2022-03-25 LAB — HCV INTERPRETATION

## 2022-03-25 LAB — HCV AB W REFLEX TO QUANT PCR: HCV Ab: NONREACTIVE

## 2022-03-25 NOTE — Telephone Encounter (Signed)
Contacted pt to go over lab results pt is aware and doesn't have any questions or concerns 

## 2022-03-31 ENCOUNTER — Other Ambulatory Visit (HOSPITAL_COMMUNITY): Payer: Self-pay

## 2022-04-01 ENCOUNTER — Other Ambulatory Visit (HOSPITAL_COMMUNITY): Payer: Self-pay

## 2022-04-02 ENCOUNTER — Other Ambulatory Visit (HOSPITAL_COMMUNITY): Payer: Self-pay | Admitting: Pharmacist

## 2022-04-02 ENCOUNTER — Other Ambulatory Visit (HOSPITAL_COMMUNITY): Payer: Self-pay

## 2022-04-02 DIAGNOSIS — E782 Mixed hyperlipidemia: Secondary | ICD-10-CM

## 2022-04-02 MED ORDER — LOSARTAN POTASSIUM 25 MG PO TABS
25.0000 mg | ORAL_TABLET | Freq: Every day | ORAL | 1 refills | Status: DC
  Filled 2022-04-02: qty 30, 30d supply, fill #0

## 2022-04-02 MED ORDER — NITROGLYCERIN 0.4 MG SL SUBL
0.4000 mg | SUBLINGUAL_TABLET | SUBLINGUAL | 1 refills | Status: DC | PRN
  Filled 2022-04-02: qty 25, 5d supply, fill #0
  Filled 2023-01-07: qty 25, 5d supply, fill #1

## 2022-04-02 MED ORDER — ATORVASTATIN CALCIUM 80 MG PO TABS
80.0000 mg | ORAL_TABLET | Freq: Every day | ORAL | 1 refills | Status: DC
  Filled 2022-04-02: qty 30, 30d supply, fill #0
  Filled 2022-04-29: qty 30, 30d supply, fill #1

## 2022-04-02 MED ORDER — ASPIRIN 81 MG PO TBEC
81.0000 mg | DELAYED_RELEASE_TABLET | Freq: Every day | ORAL | 1 refills | Status: DC
  Filled 2022-04-02: qty 30, 30d supply, fill #0

## 2022-04-02 MED ORDER — NITROGLYCERIN 0.4 MG SL SUBL
0.4000 mg | SUBLINGUAL_TABLET | SUBLINGUAL | 1 refills | Status: DC | PRN
  Filled 2022-04-02: qty 25, 1d supply, fill #0

## 2022-04-02 MED ORDER — LOSARTAN POTASSIUM 25 MG PO TABS
25.0000 mg | ORAL_TABLET | Freq: Every day | ORAL | 1 refills | Status: DC
  Filled 2022-04-02: qty 30, 30d supply, fill #0
  Filled 2022-04-29: qty 30, 30d supply, fill #1

## 2022-04-02 MED ORDER — ATORVASTATIN CALCIUM 80 MG PO TABS
80.0000 mg | ORAL_TABLET | Freq: Every day | ORAL | 1 refills | Status: DC
  Filled 2022-04-02: qty 30, 30d supply, fill #0

## 2022-04-05 ENCOUNTER — Telehealth (HOSPITAL_COMMUNITY): Payer: Self-pay | Admitting: Pharmacist

## 2022-04-05 MED ORDER — DAPAGLIFLOZIN PROPANEDIOL 10 MG PO TABS: 10.0000 mg | ORAL_TABLET | Freq: Every day | ORAL | 3 refills | Status: DC

## 2022-04-05 NOTE — Telephone Encounter (Signed)
Received message that patient has been approved to receive Farxiga from [employer]. Patient was recently started on Jardiance by another provider. Patient reports he has already received the [place] through the mail.   At this time, will stop Jardiance as manufacturer assistance for that medication is not approved, and continue Farxiga 10 mg daily. Medication list updated.   Audry Riles, PharmD, BCPS, BCCP, CPP Heart Failure Clinic Pharmacist [phone]

## 2022-04-05 NOTE — Telephone Encounter (Signed)
Advanced Heart Failure Patient Advocate Encounter  Contacted [employer] for update, representative confirmed Brilinta and Wilder Glade have been approved until 03/23/2023.  Medications were delivered to patient on 03/30/22.  Contacted [employer] about [employer]; no current approval on file, last refill was shipped to patient in 01/2021. Current med list indicates that patient has discontinued Entresto and therefore does not need assistance at this time.  Clista Bernhardt, CPhT Rx Patient Advocate Phone: [phone]

## 2022-04-06 ENCOUNTER — Telehealth (HOSPITAL_COMMUNITY): Payer: Self-pay

## 2022-04-06 NOTE — Telephone Encounter (Signed)
Called and was unable to leave patient a voice message to confirm/remind patient of their appointment at the [facility] on 04/07/22.

## 2022-04-07 ENCOUNTER — Inpatient Hospital Stay (HOSPITAL_COMMUNITY): Admission: RE | Admit: 2022-04-07 | Payer: Self-pay | Source: Ambulatory Visit

## 2022-04-19 ENCOUNTER — Other Ambulatory Visit (HOSPITAL_COMMUNITY): Payer: Self-pay

## 2022-04-20 ENCOUNTER — Other Ambulatory Visit (HOSPITAL_COMMUNITY): Payer: Self-pay

## 2022-04-29 ENCOUNTER — Emergency Department (HOSPITAL_COMMUNITY): Payer: Self-pay

## 2022-04-29 ENCOUNTER — Other Ambulatory Visit (HOSPITAL_COMMUNITY): Payer: Self-pay

## 2022-04-29 ENCOUNTER — Emergency Department (HOSPITAL_COMMUNITY)
Admission: EM | Admit: 2022-04-29 | Discharge: 2022-04-30 | Disposition: A | Discharge status: Home / Self Care | Payer: Self-pay | Attending: Emergency Medicine | Admitting: Emergency Medicine

## 2022-04-29 ENCOUNTER — Other Ambulatory Visit: Payer: Self-pay

## 2022-04-29 ENCOUNTER — Encounter (HOSPITAL_COMMUNITY): Payer: Self-pay

## 2022-04-29 DIAGNOSIS — I10 Essential (primary) hypertension: Secondary | ICD-10-CM | POA: Insufficient documentation

## 2022-04-29 DIAGNOSIS — Z79899 Other long term (current) drug therapy: Secondary | ICD-10-CM | POA: Insufficient documentation

## 2022-04-29 DIAGNOSIS — R079 Chest pain, unspecified: Secondary | ICD-10-CM | POA: Insufficient documentation

## 2022-04-29 DIAGNOSIS — I2510 Atherosclerotic heart disease of native coronary artery without angina pectoris: Secondary | ICD-10-CM | POA: Insufficient documentation

## 2022-04-29 DIAGNOSIS — Z7982 Long term (current) use of aspirin: Secondary | ICD-10-CM | POA: Insufficient documentation

## 2022-04-29 LAB — BASIC METABOLIC PANEL
Anion gap: 7 (ref 5–15)
BUN: 16 mg/dL (ref 8–23)
CO2: 20 mmol/L — ABNORMAL LOW (ref 22–32)
Calcium: 9.4 mg/dL (ref 8.9–10.3)
Chloride: 110 mmol/L (ref 98–111)
Creatinine, Ser: 1.38 mg/dL — ABNORMAL HIGH (ref 0.61–1.24)
GFR, Estimated: 57 mL/min — ABNORMAL LOW (ref >60)
Glucose, Bld: 121 mg/dL — ABNORMAL HIGH (ref 70–99)
Potassium: 4.2 mmol/L (ref 3.5–5.1)
Sodium: 137 mmol/L (ref 135–145)

## 2022-04-29 LAB — CBC WITH DIFFERENTIAL/PLATELET
Abs Immature Granulocytes: 0.03 10*3/uL (ref 0.00–0.07)
Basophils Absolute: 0 10*3/uL (ref 0.0–0.1)
Basophils Relative: 0 %
Eosinophils Absolute: 0.3 10*3/uL (ref 0.0–0.5)
Eosinophils Relative: 3 %
HCT: 43.2 % (ref 39.0–52.0)
Hemoglobin: 14.9 g/dL (ref 13.0–17.0)
Immature Granulocytes: 0 %
Lymphocytes Relative: 11 %
Lymphs Abs: 1.3 10*3/uL (ref 0.7–4.0)
MCH: 33 pg (ref 26.0–34.0)
MCHC: 34.5 g/dL (ref 30.0–36.0)
MCV: 95.6 fL (ref 80.0–100.0)
Monocytes Absolute: 1.2 10*3/uL — ABNORMAL HIGH (ref 0.1–1.0)
Monocytes Relative: 11 %
Neutro Abs: 8.6 10*3/uL — ABNORMAL HIGH (ref 1.7–7.7)
Neutrophils Relative %: 75 %
Platelets: 249 10*3/uL (ref 150–400)
RBC: 4.52 MIL/uL (ref 4.22–5.81)
RDW: 13.1 % (ref 11.5–15.5)
WBC: 11.4 10*3/uL — ABNORMAL HIGH (ref 4.0–10.5)
nRBC: 0 % (ref 0.0–0.2)

## 2022-04-29 LAB — BRAIN NATRIURETIC PEPTIDE: B Natriuretic Peptide: 53.1 pg/mL (ref 0.0–100.0)

## 2022-04-29 LAB — TROPONIN I (HIGH SENSITIVITY): Troponin I (High Sensitivity): 28 ng/L — ABNORMAL HIGH (ref <18)

## 2022-04-29 NOTE — ED Provider Triage Note (Signed)
Emergency Medicine Provider Triage Evaluation Note  Logan Brewer , a 66 y.o. male  was evaluated in triage.  Pt complains of chest pain-- pressure in center chest.  Did take SL NTG at home, threw up but pain got better.  Already had home ASA this AM so additional not given with EMS.  No further chest pain.  Hx NSTEMI and prior cardiac arrest.  Followed by cardiology, Dr. Herbie Baltimore.  Review of Systems  Positive: Chest pain Negative: fever  Physical Exam  BP (!) 150/71   Pulse 74   Temp 98.6 F (37 C)   Resp 16   Ht 5\' 5"  (1.651 m)   Wt 83.5 kg   SpO2 98%   BMI 30.62 kg/m   Gen:   Awake, no distress   Resp:  Normal effort  MSK:   Moves extremities without difficulty  Other:    Medical Decision Making  Medically screening exam initiated at 10:33 PM.  Appropriate orders placed.  Logan Brewer was informed that the remainder of the evaluation will be completed by another provider, this initial triage assessment does not replace that evaluation, and the importance of remaining in the ED until their evaluation is complete.  Chest pain.  Hx NSTEMI and cardiac arrest.  Follows with Dr. [name].  No active chest pain currently.  Will check EKG, labs, CXR.   Herbie Baltimore, PA-C 04/29/22 2301

## 2022-04-29 NOTE — ED Triage Notes (Addendum)
Pt to ED via [facility]. Pt had chest pain, took ntg x 1, vomited.    Pt c/o nausea, mid-center chest pain, non-radiating. Pt denies shortness of breath. Pt states he has been unable to eat all day.   172/88 Hr=72 18g LFA Cbg=108

## 2022-04-30 LAB — TROPONIN I (HIGH SENSITIVITY): Troponin I (High Sensitivity): 27 ng/L — ABNORMAL HIGH (ref <18)

## 2022-04-30 NOTE — ED Provider Notes (Signed)
[facility] EMERGENCY DEPARTMENT Provider Note   CSN: [id number] Arrival date & time: 04/29/22  2227     History  Chief Complaint  Patient presents with  . Chest Pain    Logan Brewer is a 66 y.o. male.  66 y/o male with hx of HTN, tobacco use, ICM (LVEF 40%), and NSTEMI (s/p PCI to LAD in 02/2022) presents to the ED for chest pain.  Patient states that he took his daily medications this morning on an empty stomach.  Went to the [place] store this afternoon to get some cakes for his wife's birthday.  Had 2 cakes and a grape juice and began to feel unsettling in his stomach.  Came home and thought that drinking warm tea would help, but he began to feel worse after this and started having vomiting with associated chest pain.  Chest pain pressure-like in the center of his chest.  Following multiple episodes of emesis, pain has improved.  He presently has no complaints.  Did take 1 SL NTG prior to arrival.  States that his symptoms feel different from his prior NSTEMI.  No associated fever, syncope, back pain, extremity numbness/paresthesias.   The history is provided by the patient. No language interpreter was used.  Chest Pain      Home Medications Prior to Admission medications   Medication Sig Start Date End Date Taking? Authorizing Provider  amLODipine (NORVASC) 10 MG tablet Take 1 tablet (10 mg total) by mouth daily. 09/17/21  Yes Bensimhon, Bevelyn Buckles, MD  aspirin EC 81 MG tablet Take 1 tablet (81 mg total) by mouth daily. Swallow whole. 04/02/22  Yes Bensimhon, Bevelyn Buckles, MD  atorvastatin (LIPITOR) 80 MG tablet Take 1 tablet (80 mg total) by mouth daily. 04/02/22  Yes Bensimhon, Bevelyn Buckles, MD  carvedilol (COREG) 6.25 MG tablet Take 1 tablet (6.25 mg total) by mouth 2 (two) times daily with a meal. 09/17/21  Yes Bensimhon, Bevelyn Buckles, MD  dapagliflozin propanediol (FARXIGA) 10 MG TABS tablet Take 1 tablet (10 mg total) by mouth daily before breakfast. 04/05/22  Yes  Bensimhon, Bevelyn Buckles, MD  losartan (COZAAR) 25 MG tablet Take 1 tablet (25 mg total) by mouth daily. 04/02/22  Yes Bensimhon, Bevelyn Buckles, MD  nitroGLYCERIN (NITROSTAT) 0.4 MG SL tablet Place 1 tablet (0.4 mg total) under the tongue every 5 (five) minutes as needed for chest pain. 04/02/22  Yes Bensimhon, Bevelyn Buckles, MD  spironolactone (ALDACTONE) 25 MG tablet Take 1 tablet (25 mg total) by mouth daily. 09/17/21  Yes Bensimhon, Bevelyn Buckles, MD      Allergies    Patient has no known allergies.    Review of Systems   Review of Systems  Cardiovascular:  Positive for chest pain.  Ten systems reviewed and are negative for acute change, except as noted in the HPI.    Physical Exam Updated Vital Signs BP (!) 150/77   Pulse 75   Temp 98.4 F (36.9 C) (Oral)   Resp 19   Ht 5\' 5"  (1.651 m)   Wt 83.5 kg   SpO2 97%   BMI 30.62 kg/m   Physical Exam Vitals and nursing note reviewed.  Constitutional:      General: He is not in acute distress.    Appearance: He is well-developed. He is not diaphoretic.     Comments: Nontoxic appearing and in NAD  HENT:     Head: Normocephalic and atraumatic.  Eyes:     General: No scleral icterus.  Conjunctiva/sclera: Conjunctivae normal.  Cardiovascular:     Rate and Rhythm: Normal rate and regular rhythm.     Pulses: Normal pulses.  Pulmonary:     Effort: Pulmonary effort is normal.     Breath sounds: No stridor. No wheezing.     Comments: Lungs CTAB. Respirations even and unlabored. Abdominal:     Palpations: Abdomen is soft. There is no mass.     Tenderness: There is no abdominal tenderness. There is no guarding.     Comments: Soft, nondistended, nontender  Musculoskeletal:        General: Normal range of motion.     Cervical back: Normal range of motion.     Right lower leg: No edema.     Left lower leg: No edema.  Skin:    General: Skin is warm and dry.     Coloration: Skin is not pale.     Findings: No erythema or rash.  Neurological:      Mental Status: He is alert and oriented to person, place, and time.     Coordination: Coordination normal.  Psychiatric:        Behavior: Behavior normal.     ED Results / Procedures / Treatments   Labs (all labs ordered are listed, but only abnormal results are displayed) Labs Reviewed  CBC WITH DIFFERENTIAL/PLATELET - Abnormal; Notable for the following components:      Result Value   WBC 11.4 (*)    Neutro Abs 8.6 (*)    Monocytes Absolute 1.2 (*)    All other components within normal limits  BASIC METABOLIC PANEL - Abnormal; Notable for the following components:   CO2 20 (*)    Glucose, Bld 121 (*)    Creatinine, Ser 1.38 (*)    GFR, Estimated 57 (*)    All other components within normal limits  TROPONIN I (HIGH SENSITIVITY) - Abnormal; Notable for the following components:   Troponin I (High Sensitivity) 28 (*)    All other components within normal limits  TROPONIN I (HIGH SENSITIVITY) - Abnormal; Notable for the following components:   Troponin I (High Sensitivity) 27 (*)    All other components within normal limits  BRAIN NATRIURETIC PEPTIDE    EKG EKG Interpretation  Date/Time:  Thursday April 29 2022 22:42:35 EST Ventricular Rate:  76 PR Interval:  146 QRS Duration: 90 QT Interval:  356 QTC Calculation: 400 R Axis:   70 Text Interpretation: Normal sinus rhythm Septal infarct , age undetermined ST & T wave abnormality, consider inferolateral ischemia Abnormal ECG When compared with ECG of 03-Mar-2022 04:22, T wave abnormality has improved Reconfirmed by Delora Fuel ([id number]) on 04/30/2022 12:53:30 AM  Radiology DG Chest 2 View  Result Date: 04/29/2022 CLINICAL DATA:  Chest pain. Nausea. EXAM: CHEST - 2 VIEW COMPARISON:  03/01/2022 FINDINGS: Heart is normal in size. Stable mediastinal contours. Mild patchy right infrahilar opacity. There may be a tiny right pleural effusion. The left lung is clear. No pulmonary edema. No pneumothorax. No acute osseous findings.  IMPRESSION: Mild patchy right infrahilar opacity, atelectasis versus pneumonia. Possible tiny right pleural effusion. Electronically Signed   By: Keith Rake M.D.   On: 04/29/2022 23:14    Procedures Procedures    Medications Ordered in ED Medications - No data to display  ED Course/ Medical Decision Making/ A&P Clinical Course as of 04/30/22 0230  Fri Apr 30, 2022  0200 Patient remains chest pain free on reassessment [KH]    Clinical Course User  Index [KH] Antonietta Breach, PA-C                           Medical Decision Making  This patient presents to the ED for concern of chest pain and vomiting, this involves an extensive number of treatment options, and is a complaint that carries with it a high risk of complications and morbidity.  The differential diagnosis includes ACS vs esophageal spasm vs gastritis/PUD vs Boerhaave's vs PTX    Co morbidities that complicate the patient evaluation  HTN ICM CAD w/hx of NSTEMI   Additional history obtained:  Additional history obtained from EMS External records from outside source obtained and reviewed including heart cath from 02/2022 showing 90% lesion to LAD; PCI completed during this cath   Lab Tests:  I Ordered, and personally interpreted labs.  The pertinent results include:  Troponin of 28 > 27 (down from 5268 during NSTEMI admission in September; baseline troponin ~45 in 03/2021). Creatinine of 1.38 (stable). Leukocytosis of 11.4. BNP normal.   Imaging Studies ordered:  I ordered imaging studies including CXR  I independently visualized and interpreted imaging which showed mild patchy right infrahilar opacity I agree with the radiologist interpretation   Cardiac Monitoring:  The patient was maintained on a cardiac monitor.  I personally viewed and interpreted the cardiac monitored which showed an underlying rhythm of: NSR   Medicines ordered and prescription drug management:  I have reviewed the patients home  medicines and have made adjustments as needed   Test Considered:  CTA   Problem List / ED Course:  Patient with hx of CAD and NSTEMI in September 2023 s/p PCI to LAD. Symptoms today seem atypical for ACS and began after eating a cake from the [place] store. Chest pain has resolved since vomiting began and has not recurred. Patient chest pain free throughout ED encounter. EKG is improved compared to prior and troponin is flat; troponin values stable compared to pre-NSTEMI baseline.   Chest x-ray without evidence of mediastinal widening to suggest dissection.  No pneumothorax. CXR questions infrahilar opacity. I favor this to reflect atelectasis. Leukocytosis noted which is new for the patient, but suspected secondary to frequent vomiting PTA. He has no fever, hypoxia, tachypnea, dyspnea to suspect PNA. Suspect food-borne illness inciting vomiting and secondary chest pain.   Reevaluation:  After the interventions noted above, I reevaluated the patient and found that they have :stayed the same   Social Determinants of Health:  Uninsured Established with PCP and Cardiology   Dispostion:  After consideration of the diagnostic results and the patients response to treatment, I feel that the patent would benefit from close outpatient PCP and cardiology follow up. Return precautions discussed and provided. Patient discharged in stable condition with no unaddressed concerns.           Final Clinical Impression(s) / ED Diagnoses Final diagnoses:  Nonspecific chest pain    Rx / DC Orders ED Discharge Orders     None         Antonietta Breach, PA-C [id number]    Delora Fuel, MD [id number] [phone]

## 2022-04-30 NOTE — Discharge Instructions (Signed)
Your work-up in the ED today was reassuring.  Given your history, we do advise follow-up with your cardiologist as well as your primary care doctor.  Continue your daily prescribed medications.  Return for any new or concerning symptoms.

## 2022-05-25 ENCOUNTER — Telehealth (HOSPITAL_COMMUNITY): Payer: Self-pay | Admitting: Licensed Clinical Social Worker

## 2022-05-25 ENCOUNTER — Other Ambulatory Visit (HOSPITAL_COMMUNITY): Payer: Self-pay | Admitting: Internal Medicine

## 2022-05-25 ENCOUNTER — Other Ambulatory Visit (HOSPITAL_COMMUNITY): Payer: Self-pay

## 2022-05-25 NOTE — Telephone Encounter (Signed)
H&V Care Navigation CSW Progress Note  Clinical Social Worker reached out to pt to discuss potential insurance options for pt who has been utilizing the HF fund to get medications.  CSW explained new Medicaid expansion to pt which took effect 12/1 and pt reports they are below the required income level so might be eligible.    CSW mailing out Medicaid application at patient request and instructions on how to submit to [facility] for review.  SDOH Screenings   Food Insecurity: No Food Insecurity (09/17/2021)  Housing: Medium Risk (09/18/2021)  Transportation Needs: No Transportation Needs (09/17/2021)  Depression (PHQ2-9): Low Risk  (03/24/2022)  Financial Resource Strain: Low Risk  (09/17/2021)  Tobacco Use: Medium Risk (04/29/2022)    Burna Sis, LCSW Clinical Social Worker [facility] Failure Clinic Desk#: [phone] Cell#: [phone]

## 2022-05-26 ENCOUNTER — Other Ambulatory Visit (HOSPITAL_COMMUNITY): Payer: Self-pay

## 2022-05-27 ENCOUNTER — Other Ambulatory Visit: Payer: Self-pay

## 2022-05-27 ENCOUNTER — Other Ambulatory Visit (HOSPITAL_COMMUNITY): Payer: Self-pay

## 2022-05-28 ENCOUNTER — Other Ambulatory Visit (HOSPITAL_COMMUNITY): Payer: Self-pay

## 2022-05-28 ENCOUNTER — Other Ambulatory Visit (HOSPITAL_COMMUNITY): Payer: Self-pay | Admitting: Primary Care

## 2022-05-28 DIAGNOSIS — E782 Mixed hyperlipidemia: Secondary | ICD-10-CM

## 2022-05-28 NOTE — Telephone Encounter (Signed)
Requested medication (s) are due for refill today: routing for review  Requested medication (s) are on the active medication list: yes  Last refill:  04/02/22  Future visit scheduled: yes  Notes to clinic:  Unable to refill per protocol, last refill by another provider. Routing for review.     Requested Prescriptions  Pending Prescriptions Disp Refills   atorvastatin (LIPITOR) 80 MG tablet 30 tablet 1    Sig: Take 1 tablet (80 mg total) by mouth daily.     There is no refill protocol information for this order     losartan (COZAAR) 25 MG tablet 30 tablet 1    Sig: Take 1 tablet (25 mg total) by mouth daily.     There is no refill protocol information for this order

## 2022-05-31 NOTE — Telephone Encounter (Signed)
Will forward to provider  

## 2022-06-02 ENCOUNTER — Other Ambulatory Visit (HOSPITAL_COMMUNITY): Payer: Self-pay

## 2022-06-03 ENCOUNTER — Ambulatory Visit: Payer: Self-pay

## 2022-06-03 NOTE — Telephone Encounter (Signed)
Returned pt call and made aware that he will need to contact his cardiologist to get refills. Pt states he has already spoken to someone and they made him aware to contact his pharmacy. Pt doesn't have any questions or concerns

## 2022-06-03 NOTE — Telephone Encounter (Signed)
Pt reports that he went to his pharmacy to pick up his medications but he was told he needed to contact provider for approval to get a refill. Pt did not have the name of the medication he just requested to be contacted to discuss getting a refill of his blood pressure medication. Cb# [phone]  Pt. Calling again for refills on Lipitor and Cozaar. Please advise pt. Answer Assessment - Initial Assessment Questions 1. DRUG NAME: "What medicine do you need to have refilled?"     Lipitor, Cozaar  2. REFILLS REMAINING: "How many refills are remaining?" (Note: The label on the medicine or pill bottle will show how many refills are remaining. If there are no refills remaining, then a renewal may be needed.)     0 3. EXPIRATION DATE: "What is the expiration date?" (Note: The label states when the prescription will expire, and thus can no longer be refilled.)     N/a 4. PRESCRIBING HCP: "Who prescribed it?" Reason: If prescribed by specialist, call should be referred to that group.      5. SYMPTOMS: "Do you have any symptoms?"     N/a 6. PREGNANCY: "Is there any chance that you are pregnant?" "When was your last menstrual period?"     N/a  Protocols used: Medication Refill and Renewal Call-A-AH

## 2022-06-07 ENCOUNTER — Other Ambulatory Visit (HOSPITAL_COMMUNITY): Payer: Self-pay

## 2022-06-07 ENCOUNTER — Other Ambulatory Visit (HOSPITAL_COMMUNITY): Payer: Self-pay | Admitting: *Deleted

## 2022-06-07 ENCOUNTER — Other Ambulatory Visit (HOSPITAL_COMMUNITY): Payer: Self-pay | Admitting: Internal Medicine

## 2022-06-07 DIAGNOSIS — E782 Mixed hyperlipidemia: Secondary | ICD-10-CM

## 2022-06-07 MED ORDER — ATORVASTATIN CALCIUM 80 MG PO TABS
80.0000 mg | ORAL_TABLET | Freq: Every day | ORAL | 0 refills | Status: DC
  Filled 2022-06-07: qty 30, 30d supply, fill #0

## 2022-06-07 MED ORDER — LOSARTAN POTASSIUM 25 MG PO TABS
25.0000 mg | ORAL_TABLET | Freq: Every day | ORAL | 1 refills | Status: DC
  Filled 2022-06-07: qty 30, 30d supply, fill #0

## 2022-06-07 MED ORDER — ATORVASTATIN CALCIUM 80 MG PO TABS
80.0000 mg | ORAL_TABLET | Freq: Every day | ORAL | 1 refills | Status: DC
  Filled 2022-06-07: qty 30, 30d supply, fill #0

## 2022-06-07 MED ORDER — LOSARTAN POTASSIUM 25 MG PO TABS
25.0000 mg | ORAL_TABLET | Freq: Every day | ORAL | 0 refills | Status: DC
  Filled 2022-06-07: qty 30, 30d supply, fill #0

## 2022-06-15 ENCOUNTER — Other Ambulatory Visit (HOSPITAL_COMMUNITY): Payer: Self-pay

## 2022-06-22 ENCOUNTER — Emergency Department (HOSPITAL_COMMUNITY): Payer: Self-pay

## 2022-06-22 ENCOUNTER — Ambulatory Visit (INDEPENDENT_AMBULATORY_CARE_PROVIDER_SITE_OTHER): Payer: Self-pay | Admitting: *Deleted

## 2022-06-22 ENCOUNTER — Emergency Department (HOSPITAL_COMMUNITY)
Admission: EM | Admit: 2022-06-22 | Discharge: 2022-06-23 | Disposition: A | Discharge status: Home / Self Care | Payer: Self-pay | Attending: Emergency Medicine | Admitting: Emergency Medicine

## 2022-06-22 DIAGNOSIS — I110 Hypertensive heart disease with heart failure: Secondary | ICD-10-CM | POA: Insufficient documentation

## 2022-06-22 DIAGNOSIS — I2510 Atherosclerotic heart disease of native coronary artery without angina pectoris: Secondary | ICD-10-CM | POA: Insufficient documentation

## 2022-06-22 DIAGNOSIS — Z7982 Long term (current) use of aspirin: Secondary | ICD-10-CM | POA: Insufficient documentation

## 2022-06-22 DIAGNOSIS — Z79899 Other long term (current) drug therapy: Secondary | ICD-10-CM | POA: Insufficient documentation

## 2022-06-22 DIAGNOSIS — M7918 Myalgia, other site: Secondary | ICD-10-CM

## 2022-06-22 DIAGNOSIS — Z955 Presence of coronary angioplasty implant and graft: Secondary | ICD-10-CM | POA: Insufficient documentation

## 2022-06-22 DIAGNOSIS — I5022 Chronic systolic (congestive) heart failure: Secondary | ICD-10-CM | POA: Insufficient documentation

## 2022-06-22 DIAGNOSIS — R109 Unspecified abdominal pain: Secondary | ICD-10-CM | POA: Insufficient documentation

## 2022-06-22 DIAGNOSIS — Z87891 Personal history of nicotine dependence: Secondary | ICD-10-CM | POA: Insufficient documentation

## 2022-06-22 LAB — COMPREHENSIVE METABOLIC PANEL
ALT: 22 U/L (ref 0–44)
AST: 19 U/L (ref 15–41)
Albumin: 3.6 g/dL (ref 3.5–5.0)
Alkaline Phosphatase: 68 U/L (ref 38–126)
Anion gap: 9 (ref 5–15)
BUN: 11 mg/dL (ref 8–23)
CO2: 21 mmol/L — ABNORMAL LOW (ref 22–32)
Calcium: 9.1 mg/dL (ref 8.9–10.3)
Chloride: 108 mmol/L (ref 98–111)
Creatinine, Ser: 1.21 mg/dL (ref 0.61–1.24)
GFR, Estimated: >60 mL/min (ref >60)
Glucose, Bld: 136 mg/dL — ABNORMAL HIGH (ref 70–99)
Potassium: 3.6 mmol/L (ref 3.5–5.1)
Sodium: 138 mmol/L (ref 135–145)
Total Bilirubin: 0.5 mg/dL (ref 0.3–1.2)
Total Protein: 7 g/dL (ref 6.5–8.1)

## 2022-06-22 LAB — CBC WITH DIFFERENTIAL/PLATELET
Abs Immature Granulocytes: 0.01 10*3/uL (ref 0.00–0.07)
Basophils Absolute: 0 10*3/uL (ref 0.0–0.1)
Basophils Relative: 1 %
Eosinophils Absolute: 0.2 10*3/uL (ref 0.0–0.5)
Eosinophils Relative: 3 %
HCT: 46.9 % (ref 39.0–52.0)
Hemoglobin: 14.9 g/dL (ref 13.0–17.0)
Immature Granulocytes: 0 %
Lymphocytes Relative: 33 %
Lymphs Abs: 1.9 10*3/uL (ref 0.7–4.0)
MCH: 31.4 pg (ref 26.0–34.0)
MCHC: 31.8 g/dL (ref 30.0–36.0)
MCV: 98.7 fL (ref 80.0–100.0)
Monocytes Absolute: 0.7 10*3/uL (ref 0.1–1.0)
Monocytes Relative: 11 %
Neutro Abs: 3 10*3/uL (ref 1.7–7.7)
Neutrophils Relative %: 52 %
Platelets: 280 10*3/uL (ref 150–400)
RBC: 4.75 MIL/uL (ref 4.22–5.81)
RDW: 12.9 % (ref 11.5–15.5)
WBC: 5.7 10*3/uL (ref 4.0–10.5)
nRBC: 0 % (ref 0.0–0.2)

## 2022-06-22 NOTE — ED Provider Triage Note (Signed)
Emergency Medicine Provider Triage Evaluation Note  Logan Brewer , a 67 y.o. male  was evaluated in triage.  Pt complains of flank pain.  About a week ago.  Located in his right flank.  Denies hematuria, painful urination.  Denies chest pain shortness of breath.  Flank pain is worse with movement.  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    Review of Systems  Positive: See above Negative: See above  Physical Exam  BP (!) 140/70 (BP Location: Right Arm)   Pulse 66   Temp 98.1 F (36.7 C) (Oral)   Resp 15   SpO2 97%  Gen:   Awake, no distress   Resp:  Normal effort  MSK:   Moves extremities without difficulty  Other:  Point tenderness in the right lateral chest wall, no CVA tenderness  Medical Decision Making  Medically screening exam initiated at 2:29 PM.  Appropriate orders placed.  Logan Brewer was informed that the remainder of the evaluation will be completed by another provider, this initial triage assessment does not replace that evaluation, and the importance of remaining in the ED until their evaluation is complete.  Work up started   Harriet Pho, PA-C 06/22/22 1431

## 2022-06-22 NOTE — Telephone Encounter (Signed)
  Chief Complaint: Abdominal Pain Symptoms: Right sided abdominal pain, under ribs, 8-9/10. Frequency: 6 days Pertinent Negatives: Patient denies N/V/D. Pain does not radiate. No injury. Disposition: [x] ED /[] Urgent Care (no appt availability in office) / [] Appointment(In office/virtual)/ []  [place] [facility]/ [] Home Care/ [] Refused Recommended Disposition /[] [place] [facility]/ []  Follow-up with PCP Additional Notes: Pt sounds distressed, advised ED, states will follow disposition. Care advise provided, verbalizes understanding.  Reason for Disposition  [1] SEVERE pain AND [2] age > 60 years  Answer Assessment - Initial Assessment Questions 1. LOCATION: "Where does it hurt?"      Right sided "Under ribs" 2. RADIATION: "Does the pain shoot anywhere else?" (e.g., chest, back)     No 3. ONSET: "When did the pain begin?" (Minutes, hours or days ago)       6 days ago 4. SUDDEN: "Gradual or sudden onset?"     Suddenly 5. PATTERN "Does the pain come and go, or is it constant?"    - If it comes and goes: "How long does it last?" "Do you have pain now?"     (Note: Comes and goes means the pain is intermittent. It goes away completely between bouts.)    - If constant: "Is it getting better, staying the same, or getting worse?"      (Note: Constant means the pain never goes away completely; most serious pain is constant and gets worse.)      Pain med helps, Advil 6. SEVERITY: "How bad is the pain?"  (e.g., Scale 1-10; mild, moderate, or severe)    - MILD (1-3): Doesn't interfere with normal activities, abdomen soft and not tender to touch.     - MODERATE (4-7): Interferes with normal activities or awakens from sleep, abdomen tender to touch.     - SEVERE (8-10): Excruciating pain, doubled over, unable to do any normal activities.       8-9/10 7. RECURRENT SYMPTOM: "Have you ever had this type of stomach pain before?" If Yes, ask: "When was the last time?" and "What happened that time?"       no 8. CAUSE: "What do you think is causing the stomach pain?"     Unsure 9. RELIEVING/AGGRAVATING FACTORS: "What makes it better or worse?" (e.g., antacids, bending or twisting motion, bowel movement)     Advil helps 10. OTHER SYMPTOMS: "Do you have any other symptoms?" (e.g., back pain, diarrhea, fever, urination pain, vomiting)       No  Protocols used: Abdominal Pain - Male-A-AH

## 2022-06-22 NOTE — ED Triage Notes (Signed)
Patient complains of right sided flank pain that started last week. Denies dysuria, is alert, oriented, and in no apparent distress at this time.

## 2022-06-22 NOTE — Telephone Encounter (Signed)
Patient being treated in the ED.

## 2022-06-23 LAB — URINALYSIS, ROUTINE W REFLEX MICROSCOPIC
Bacteria, UA: NONE SEEN
Bilirubin Urine: NEGATIVE
Glucose, UA: >=500 mg/dL — AB
Hgb urine dipstick: NEGATIVE
Ketones, ur: NEGATIVE mg/dL
Leukocytes,Ua: NEGATIVE
Nitrite: NEGATIVE
Protein, ur: NEGATIVE mg/dL
Specific Gravity, Urine: 1.03 (ref 1.005–1.030)
pH: 5 (ref 5.0–8.0)

## 2022-06-23 MED ORDER — ACETAMINOPHEN 500 MG PO TABS
1000.0000 mg | ORAL_TABLET | Freq: Once | ORAL | Status: AC
  Administered 2022-06-23: 1000 mg via ORAL
  Filled 2022-06-23: qty 2

## 2022-06-23 NOTE — ED Provider Notes (Signed)
[facility] EMERGENCY DEPARTMENT Provider Note  CSN: [id number] Arrival date & time: 06/22/22 1357  Chief Complaint(s) Flank Pain  HPI Logan Brewer is a 67 y.o. male with history of coronary artery disease, CHF, presenting for right flank and side pain.  He reports that he developed the pain after lifting something heavy.  He reports the pain is worse with movement.  He denies any urinary symptoms, nausea, vomiting, fevers, chills, anorexia.  Reports the pain has been present for around a week.  Improves with rest.  No chest pain, shortness of breath.   Past Medical History Past Medical History:  Diagnosis Date   Hydrocele in adult    Hypertension    Inguinal hernia    Ischemic cardiomyopathy    Smoker 03/16/2021   smokes 1/2 ppd PTA per hx   STEMI involving left anterior descending coronary artery (HCC) 03/12/2021   Patient Active Problem List   Diagnosis Date Noted   Non-ST elevation (NSTEMI) myocardial infarction (HCC) 03/01/2022   Chronic systolic CHF (congestive heart failure) (HCC) 03/01/2022   Prediabetes 03/01/2022   Mixed hyperlipidemia 04/09/2021   Nicotine dependence, cigarettes, uncomplicated 04/09/2021   Atrial fibrillation (HCC) 03/12/2021   Metabolic acidosis    Coronary artery disease involving native coronary artery of native heart with angina pectoris (HCC)    Chronic left shoulder pain 07/03/2020   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 Authorizing Provider  amLODipine (NORVASC) 10 MG tablet Take 1 tablet (10 mg total) by mouth daily. 09/17/21   Bensimhon, Bevelyn Buckles, MD  aspirin EC 81 MG tablet Take 1  tablet (81 mg total) by mouth daily. Swallow whole. 04/02/22   Bensimhon, Bevelyn Buckles, MD  atorvastatin (LIPITOR) 80 MG tablet Take 1 tablet (80 mg total) by mouth daily. 06/07/22   Bensimhon, Bevelyn Buckles, MD  carvedilol (COREG) 6.25 MG tablet Take 1 tablet (6.25 mg total) by mouth 2 (two) times daily with a meal. 09/17/21   Bensimhon, Bevelyn Buckles, MD  dapagliflozin propanediol (FARXIGA) 10 MG TABS tablet Take 1 tablet (10 mg total) by mouth daily before breakfast. 04/05/22   Bensimhon, Bevelyn Buckles, MD  losartan (COZAAR) 25 MG tablet Take 1 tablet (25 mg total) by mouth daily. 06/07/22   Bensimhon, Bevelyn Buckles, MD  nitroGLYCERIN (NITROSTAT) 0.4 MG SL tablet Place 1 tablet (0.4 mg total) under the tongue every 5 (five) minutes as needed for chest pain. 04/02/22   Bensimhon, Bevelyn Buckles, MD  spironolactone (ALDACTONE) 25 MG tablet Take 1 tablet (25 mg total) by mouth daily. 09/17/21   Bensimhon, Bevelyn Buckles, MD  Past Surgical History Past Surgical History:  Procedure Laterality Date   APPENDECTOMY     CORONARY STENT INTERVENTION N/A 03/11/2021   Procedure: CORONARY STENT INTERVENTION;  Surgeon: Leonie Man, MD;  Location: [facility];  Service: Cardiovascular;  Laterality: N/A;   CORONARY STENT INTERVENTION N/A 03/02/2022   Procedure: CORONARY STENT INTERVENTION;  Surgeon: Leonie Man, MD;  Location: [facility];  Service: Cardiovascular;  Laterality: N/A;   CORONARY/GRAFT ACUTE MI REVASCULARIZATION N/A 03/11/2021   Procedure: Coronary/Graft Acute MI Revascularization;  Surgeon: Leonie Man, MD;  Location: [facility];  Service: Cardiovascular;  Laterality: N/A;   HYDROCELE EXCISION Bilateral 11/21/2019   Procedure: HYDROCELECTOMY ADULT;  Surgeon: Cleon Gustin, MD;  Location: [facility];  Service: Urology;  Laterality: Bilateral;  [id number]   HYDROCELE  EXCISION Right 04/07/2020   Procedure: RIGHT HYDROCELECTOMY;  Surgeon: Cleon Gustin, MD;  Location: [facility];  Service: Urology;  Laterality: Right;   INGUINAL HERNIA REPAIR Left 11/21/2019   Procedure: HERNIA REPAIR INGUINAL ADULT;  Surgeon: Aviva Signs, MD;  Location: [facility];  Service: General;  Laterality: Left;  [id number]   LEFT HEART CATH AND CORONARY ANGIOGRAPHY N/A 03/02/2022   Procedure: LEFT HEART CATH AND CORONARY ANGIOGRAPHY;  Surgeon: Leonie Man, MD;  Location: [facility];  Service: Cardiovascular;  Laterality: N/A;   RIGHT/LEFT HEART CATH AND CORONARY ANGIOGRAPHY N/A 03/11/2021   Procedure: RIGHT/LEFT HEART CATH AND CORONARY ANGIOGRAPHY;  Surgeon: Leonie Man, MD;  Location: [facility];  Service: Cardiovascular;  Laterality: N/A;   Family History Family History  Problem Relation Age of Onset   Heart disease Neg Hx     Social History Social History   Tobacco Use   Smoking status: Former    Packs/day: 0.50    Types: Cigarettes   Smokeless tobacco: Never  Vaping Use   Vaping Use: Never used  Substance Use Topics   Alcohol use: No   Drug use: Yes    Types: Marijuana    Comment: occasionally    Allergies Patient has no known allergies.  Review of Systems Review of Systems  All other systems reviewed and are negative.   Physical Exam Vital Signs  I have reviewed the triage vital signs BP (!) 161/80   Pulse (!) 58   Temp 98.4 F (36.9 C) (Oral)   Resp 18   SpO2 98%  Physical Exam Vitals and nursing note reviewed.  Constitutional:      General: He is not in acute distress.    Appearance: Normal appearance.  HENT:     Mouth/Throat:     Mouth: Mucous membranes are moist.  Eyes:     Conjunctiva/sclera: Conjunctivae normal.  Cardiovascular:     Rate and Rhythm: Normal rate and regular rhythm.  Pulmonary:     Effort: Pulmonary effort is normal. No respiratory distress.     Breath sounds: Normal breath sounds.  Abdominal:      General: Abdomen is flat.     Palpations: Abdomen is soft.     Tenderness: There is no abdominal tenderness. There is no right CVA tenderness or left CVA tenderness.  Musculoskeletal:     Right lower leg: No edema.     Left lower leg: No edema.  Skin:    General: Skin is warm and dry.     Capillary Refill: Capillary refill takes less than 2 seconds.  Neurological:     Mental Status: He is  alert and oriented to person, place, and time. Mental status is at baseline.  Psychiatric:        Mood and Affect: Mood normal.        Behavior: Behavior normal.     ED Results and Treatments Labs (all labs ordered are listed, but only abnormal results are displayed) Labs Reviewed  COMPREHENSIVE METABOLIC PANEL - Abnormal; Notable for the following components:      Result Value   CO2 21 (*)    Glucose, Bld 136 (*)    All other components within normal limits  URINALYSIS, ROUTINE W REFLEX MICROSCOPIC - Abnormal; Notable for the following components:   Glucose, UA >=500 (*)    All other components within normal limits  CBC WITH DIFFERENTIAL/PLATELET                                                                                                                          Radiology CT Renal Stone Study  Result Date: 06/22/2022 CLINICAL DATA:  Acute flank pain EXAM: CT ABDOMEN AND PELVIS WITHOUT CONTRAST TECHNIQUE: Multidetector CT imaging of the abdomen and pelvis was performed following the standard protocol without IV contrast. RADIATION DOSE REDUCTION: This exam was performed according to the departmental dose-optimization program which includes automated exposure control, adjustment of the mA and/or kV according to patient size and/or use of iterative reconstruction technique. COMPARISON:  CT 03/13/2021 without contrast FINDINGS: Lower chest: Breathing motion at the lung bases. Mild linear opacity at the bases likely scar or atelectasis. No pleural effusion. There is some subtle fat along the wall  of the left ventricle at the edge of the imaging field. Please correlate with any history. Hepatobiliary: Grossly preserved parenchyma on this noncontrast exam. Gallbladder is nondilated. Pancreas: Preserved parenchyma. Spleen: Normal-sized spleen Adrenals/Urinary Tract: Right adrenal gland is preserved. Left adrenal gland is slightly thickened. No abnormal calcifications are seen within either kidney or along the expected course of either ureter. Stable small partially exophytic foci anterior renal cyst with diameter of 11 mm. Axial image 33 of series 3. Preserved contours of the urinary bladder. Stomach/Bowel: On this non oral contrast exam large bowel has a normal course and caliber with scattered stool. Appendix not clearly seen in the right lower quadrant. These correlate with a surgical history. Stomach is nondilated. Small bowel is nondilated. No free air or free fluid. Vascular/Lymphatic: Normal caliber aorta and IVC. Scattered vascular calcifications. No definite abnormal lymph node enlargement seen in the abdomen and pelvis. Reproductive: Mildly enlarged prostate. Other: Fat containing inguinal hernias, right greater than left. Musculoskeletal: Diffuse degenerative changes along the lumbar spine. Trace anterolisthesis of L4-5 and retrolisthesis of L2 on L3. Diffuse disc bulging as well with some stenosis. Please correlate with a specific symptoms. IMPRESSION: No obstructing renal stones or ureteral stones. No bowel obstruction, free air or free fluid. Appendix not clearly seen in the right lower quadrant but no pericecal stranding or fluid) correlate with any prior intervention. There are multifocal  degenerative changes along the lumbar spine with disc bulging and stenosis. Please correlate with any specific symptoms. Electronically Signed   By: Jill Side M.D.   On: 06/22/2022 16:54   DG Chest 2 View  Result Date: 06/22/2022 CLINICAL DATA:  Flank pain for about 1 week. EXAM: CHEST - 2 VIEW  COMPARISON:  Chest two views 04/29/2022 FINDINGS: Cardiac silhouette and mediastinal contours are within normal limits and the lungs are clear. No pleural effusion or pneumothorax. Mild multilevel degenerative disc changes of the upper thoracic spine. IMPRESSION: No active cardiopulmonary disease. Electronically Signed   By: Yvonne Kendall M.D.   On: 06/22/2022 14:59    Pertinent labs & imaging results that were available during my care of the patient were reviewed by me and considered in my medical decision making (see MDM for details).  Medications Ordered in ED Medications  acetaminophen (TYLENOL) tablet 1,000 mg (has no administration in time range)                                                                                                                                     Procedures Procedures  (including critical care time)  Medical Decision Making / ED Course   MDM:  67 year old male presenting to the emergency department with right flank and side pain.  Patient well-appearing, physical exam reassuring without abdominal tenderness, CVA tenderness.  Suspect likely muscular pain given worsened after lifting heavy objects and worse with movement.  Lower concern for intra-abdominal process with reassuring CT scan without evidence of obstruction, perforation, biliary process and reassuring labs including LFTs.  Low concern for urinary infection without CVA tenderness or symptoms of urinary infection but will follow-up urinalysis.  Patient reports pain improved at home with Advil, will recommend Tylenol as long as urinalysis is reassuring close follow-up with primary care doctor as well as rest.  Clinical Course as of 06/23/22 1104  Wed Jun 23, 2022  1100 Urinalysis reassuring. Will discharge patient to home. All questions answered. Patient comfortable with plan of discharge. Return precautions discussed with patient and specified on the after visit summary.  [WS]    Clinical  Course User Index [WS] Cristie Hem, MD     Additional history obtained: -Additional history obtained from family -External records from outside source obtained and reviewed including: Chart review including previous notes, labs, imaging, consultation notes including ED visit 04/29/22   Lab Tests: -I ordered, reviewed, and interpreted labs.   The pertinent results include:   Labs Reviewed  COMPREHENSIVE METABOLIC PANEL - Abnormal; Notable for the following components:      Result Value   CO2 21 (*)    Glucose, Bld 136 (*)    All other components within normal limits  URINALYSIS, ROUTINE W REFLEX MICROSCOPIC - Abnormal; Notable for the following components:   Glucose, UA >=500 (*)    All other components within normal limits  CBC WITH DIFFERENTIAL/PLATELET  Notable for reassuring LFTs   Imaging Studies ordered: I ordered imaging studies including CT Abdomen, XR chest On my interpretation imaging demonstrates no acute process I independently visualized and interpreted imaging. I agree with the radiologist interpretation   Medicines ordered and prescription drug management: Meds ordered this encounter  Medications   acetaminophen (TYLENOL) tablet 1,000 mg    -I have reviewed the patients home medicines and have made adjustments as needed    Social Determinants of Health:  Diagnosis or treatment significantly limited by social determinants of health: former smoker   Reevaluation: After the interventions noted above, I reevaluated the patient and found that they have improved  Co morbidities that complicate the patient evaluation  Past Medical History:  Diagnosis Date   Hydrocele in adult    Hypertension    Inguinal hernia    Ischemic cardiomyopathy    Smoker 03/16/2021   smokes 1/2 ppd PTA per hx   STEMI involving left anterior descending coronary artery (HCC) 03/12/2021      Dispostion: Disposition decision including need for hospitalization was  considered, and patient discharged from emergency department.    Final Clinical Impression(s) / ED Diagnoses Final diagnoses:  Pain in abdominal muscle of right flank     This chart was dictated using voice recognition software.  Despite best efforts to proofread,  errors can occur which can change the documentation meaning.    Lonell Grandchild, MD 06/23/22 1104

## 2022-06-23 NOTE — Discharge Instructions (Addendum)
We evaluated you for your abdominal pain.  The CT scan we obtained of your abdomen did not show any dangerous problems such as infection in your abdomen or kidney stone.  There were no signs of any urinary infection.  Since your pain began after heavy lifting, your symptoms are likely due to a strain of your abdominal muscles. Please take Tylenol and Motrin for your symptoms at home.  You can take 650 mg of Tylenol every 6 hours and 600 mg of ibuprofen every 6 hours as needed for your symptoms.  You can take these medicines together as needed, either at the same time, or alternating every 3 hours.  Please try to avoid heavy lifting until your symptoms improve.  Please follow-up closely with your primary doctor.  They may be able to refer you to physical therapy for your pain.  Please return to the emergency department if you develop any new or worsening symptoms such as severe pain, vomiting, fainting, painful urination, fevers, or any other concerning symptoms.

## 2022-06-23 NOTE — ED Notes (Signed)
Please call wife with about pt [phone]

## 2022-06-23 NOTE — ED Notes (Signed)
Patient resting in bed talking on the phone, no s/s of distress, provided pt with a snack bag and drink, will continue to monitor.

## 2022-06-25 ENCOUNTER — Telehealth (INDEPENDENT_AMBULATORY_CARE_PROVIDER_SITE_OTHER): Payer: Self-pay | Admitting: Primary Care

## 2022-06-25 NOTE — Telephone Encounter (Signed)
Spoke with patient about virtual visit on Monday.

## 2022-06-28 ENCOUNTER — Ambulatory Visit (INDEPENDENT_AMBULATORY_CARE_PROVIDER_SITE_OTHER): Payer: Self-pay | Admitting: Primary Care

## 2022-06-28 ENCOUNTER — Other Ambulatory Visit: Payer: Self-pay

## 2022-06-28 DIAGNOSIS — R109 Unspecified abdominal pain: Secondary | ICD-10-CM

## 2022-06-28 DIAGNOSIS — X500XXA Overexertion from strenuous movement or load, initial encounter: Secondary | ICD-10-CM

## 2022-06-28 DIAGNOSIS — R52 Pain, unspecified: Secondary | ICD-10-CM

## 2022-06-28 MED ORDER — MELOXICAM 7.5 MG PO TABS
7.5000 mg | ORAL_TABLET | Freq: Every day | ORAL | 0 refills | Status: DC
  Filled 2022-06-28 – 2022-07-06 (×5): qty 30, 30d supply, fill #0

## 2022-06-28 MED ORDER — IBUPROFEN 600 MG PO TABS
600.0000 mg | ORAL_TABLET | Freq: Three times a day (TID) | ORAL | 0 refills | Status: AC | PRN
  Filled 2022-06-28 – 2022-07-06 (×5): qty 30, 10d supply, fill #0

## 2022-06-29 ENCOUNTER — Other Ambulatory Visit (HOSPITAL_COMMUNITY): Payer: Self-pay

## 2022-06-29 ENCOUNTER — Other Ambulatory Visit: Payer: Self-pay

## 2022-06-30 NOTE — Progress Notes (Signed)
[facility]  Telephone Note  I connected with Logan Brewer, on 06/28/2022 at 8:13 PM  by telephone and verified that I am speaking with the correct person using two identifiers.   Consent: I discussed the limitations, risks, security and privacy concerns of performing an evaluation and management service by telephone and the availability of in person appointments. I also discussed with the patient that there may be a patient responsible charge related to this service. The patient expressed understanding and agreed to proceed.   Location of Patient: Home  Location of Provider: [facility]   Persons participating in Telemedicine visit: Hunt Oris,  NP   History of Present Illness: Mr.Logan Brewer is a 67 y.o. male  who presented to the ED with with pain after lifting something heavy.  He reports the pain is worse with movement. Today he is c/o right side flank and side pain.  He denies any urinary symptoms, nausea, vomiting, fevers, chills, anorexia.   No chest pain, shortness of breath. Alleviating factors rest.     Past Medical History:  Diagnosis Date   Hydrocele in adult    Hypertension    Inguinal hernia    Ischemic cardiomyopathy    Smoker 03/16/2021   smokes 1/2 ppd PTA per hx   STEMI involving left anterior descending coronary artery (HCC) 03/12/2021   No Known Allergies  Current Outpatient Medications on File Prior to Visit  Medication Sig Dispense Refill   amLODipine (NORVASC) 10 MG tablet Take 1 tablet (10 mg total) by mouth daily. 90 tablet 3   aspirin EC 81 MG tablet Take 1 tablet (81 mg total) by mouth daily. Swallow whole. 30 tablet 1   atorvastatin (LIPITOR) 80 MG tablet Take 1 tablet (80 mg total) by mouth daily. 30 tablet 0   carvedilol (COREG) 6.25 MG tablet Take 1 tablet (6.25 mg total) by mouth 2 (two) times daily with a meal. 180 tablet 3   dapagliflozin  propanediol (FARXIGA) 10 MG TABS tablet Take 1 tablet (10 mg total) by mouth daily before breakfast. 90 tablet 3   losartan (COZAAR) 25 MG tablet Take 1 tablet (25 mg total) by mouth daily. 30 tablet 0   nitroGLYCERIN (NITROSTAT) 0.4 MG SL tablet Place 1 tablet (0.4 mg total) under the tongue every 5 (five) minutes as needed for chest pain. 25 tablet 1   spironolactone (ALDACTONE) 25 MG tablet Take 1 tablet (25 mg total) by mouth daily. 90 tablet 3   No current facility-administered medications on file prior to visit.    Observations/Objective: See HPI   Assessment and Plan: Diagnoses and all orders for this visit:  Pain of right side of body -     ibuprofen (ADVIL) 600 MG tablet; Take 1 tablet (600 mg total) by mouth every 8 (eight) hours as needed. -     meloxicam (MOBIC) 7.5 MG tablet; Take 1 tablet (7.5 mg total) by mouth daily.     Follow Up Instructions: As needed    I discussed the assessment and treatment plan with the patient. The patient was provided an opportunity to ask questions and all were answered. The patient agreed with the plan and demonstrated an understanding of the instructions.   The patient was advised to call back or seek an in-person evaluation if the symptoms worsen or if the condition fails to improve as anticipated.     I provided 15 minutes total of non-face-to-face time during  this encounter including median intraservice time, reviewing previous notes, investigations, ordering medications, medical decision making, coordinating care and patient verbalized understanding at the end of the visit.    This note has been created with Surveyor, quantity. Any transcriptional errors are unintentional.   Kerin Perna, NP 06/30/2022, 8:13 PM

## 2022-07-01 ENCOUNTER — Other Ambulatory Visit: Payer: Self-pay

## 2022-07-01 ENCOUNTER — Other Ambulatory Visit (HOSPITAL_COMMUNITY): Payer: Self-pay

## 2022-07-02 ENCOUNTER — Other Ambulatory Visit (HOSPITAL_COMMUNITY): Payer: Self-pay | Admitting: Family Medicine

## 2022-07-02 ENCOUNTER — Other Ambulatory Visit: Payer: Self-pay

## 2022-07-02 DIAGNOSIS — I5022 Chronic systolic (congestive) heart failure: Secondary | ICD-10-CM

## 2022-07-05 ENCOUNTER — Other Ambulatory Visit: Payer: Self-pay

## 2022-07-06 ENCOUNTER — Other Ambulatory Visit (HOSPITAL_COMMUNITY): Payer: Self-pay

## 2022-07-06 ENCOUNTER — Other Ambulatory Visit (HOSPITAL_COMMUNITY): Payer: Self-pay | Admitting: Internal Medicine

## 2022-07-06 MED ORDER — LOSARTAN POTASSIUM 25 MG PO TABS
25.0000 mg | ORAL_TABLET | Freq: Every day | ORAL | 0 refills | Status: DC
  Filled 2022-07-06: qty 30, 30d supply, fill #0

## 2022-07-08 ENCOUNTER — Ambulatory Visit (INDEPENDENT_AMBULATORY_CARE_PROVIDER_SITE_OTHER): Payer: Self-pay

## 2022-07-08 NOTE — Telephone Encounter (Signed)
Reason for Disposition  [1] Abdominal pain AND [2] age > 60 years  Answer Assessment - Initial Assessment Questions 1. ONSET: "When did the pain begin?"      Today 2. LOCATION: "Where does it hurt?" (upper, mid or lower back)     Ribs to back 3. SEVERITY: "How bad is the pain?"  (e.g., Scale 1-10; mild, moderate, or severe)   - MILD (1-3): Doesn't interfere with normal activities.    - MODERATE (4-7): Interferes with normal activities or awakens from sleep.    - SEVERE (8-10): Excruciating pain, unable to do any normal activities.      6-7-8/10 4. PATTERN: "Is the pain constant?" (e.g., yes, no; constant, intermittent)      Comes and goes, Calms down and then returns 5. RADIATION: "Does the pain shoot into your legs or somewhere else?"     no 6. CAUSE:  "What do you think is causing the back pain?"      Sprained muscle 7. BACK OVERUSE:  "Any recent lifting of heavy objects, strenuous work or exercise?"     unsure 8. MEDICINES: "What have you taken so far for the pain?" (e.g., nothing, acetaminophen, NSAIDS)     Tylenol - did not work today 9. NEUROLOGIC SYMPTOMS: "Do you have any weakness, numbness, or problems with bowel/bladder control?"     no 10. OTHER SYMPTOMS: "Do you have any other symptoms?" (e.g., fever, abdomen pain, burning with urination, blood in urine)       no 11. PREGNANCY: "Is there any chance you are pregnant?" "When was your last menstrual period?"       no  Protocols used: Back Pain-A-AH

## 2022-07-08 NOTE — Telephone Encounter (Signed)
  Chief Complaint: Side pain - back pain Symptoms: above Frequency: today Pertinent Negatives: Patient denies fever Disposition: [] ED /[x] Urgent Care (no appt availability in office) / [] Appointment(In office/virtual)/ []  [facility]/ [] Home Care/ [x] Refused Recommended Disposition /[] [facility]/ []  Follow-up with PCP Additional Notes: PT called to report that pain that he had June 22, 2022 has returned. He woke up with this pain. Pt states that the pain does ebb and flow a bit, but is always there.  Pt refuses UC. PT would like to be seen in office ASAP.   Please advise.

## 2022-07-08 NOTE — Telephone Encounter (Signed)
FYI  Contacted pt and scheduled an appt for 1/23 at 910am

## 2022-07-12 ENCOUNTER — Other Ambulatory Visit (HOSPITAL_COMMUNITY): Payer: Self-pay | Admitting: Internal Medicine

## 2022-07-12 ENCOUNTER — Other Ambulatory Visit (HOSPITAL_COMMUNITY): Payer: Self-pay

## 2022-07-12 DIAGNOSIS — E782 Mixed hyperlipidemia: Secondary | ICD-10-CM

## 2022-07-12 MED ORDER — ATORVASTATIN CALCIUM 80 MG PO TABS
80.0000 mg | ORAL_TABLET | Freq: Every day | ORAL | 0 refills | Status: DC
  Filled 2022-07-12 – 2022-07-22 (×2): qty 30, 30d supply, fill #0
  Filled 2022-08-24: qty 30, 30d supply, fill #1
  Filled 2022-09-28: qty 30, 30d supply, fill #2

## 2022-07-13 ENCOUNTER — Ambulatory Visit (INDEPENDENT_AMBULATORY_CARE_PROVIDER_SITE_OTHER): Payer: Self-pay | Admitting: Primary Care

## 2022-07-22 ENCOUNTER — Other Ambulatory Visit (HOSPITAL_COMMUNITY): Payer: Self-pay

## 2022-07-26 ENCOUNTER — Other Ambulatory Visit (HOSPITAL_COMMUNITY): Payer: Self-pay

## 2022-08-02 ENCOUNTER — Other Ambulatory Visit (HOSPITAL_COMMUNITY): Payer: Self-pay

## 2022-08-02 ENCOUNTER — Other Ambulatory Visit (HOSPITAL_COMMUNITY): Payer: Self-pay | Admitting: Internal Medicine

## 2022-08-02 ENCOUNTER — Other Ambulatory Visit (INDEPENDENT_AMBULATORY_CARE_PROVIDER_SITE_OTHER): Payer: Self-pay | Admitting: Primary Care

## 2022-08-02 DIAGNOSIS — R52 Pain, unspecified: Secondary | ICD-10-CM

## 2022-08-02 MED ORDER — LOSARTAN POTASSIUM 25 MG PO TABS
25.0000 mg | ORAL_TABLET | Freq: Every day | ORAL | 0 refills | Status: DC
  Filled 2022-08-02: qty 30, 30d supply, fill #0

## 2022-08-03 NOTE — Telephone Encounter (Signed)
Requested medication (s) are due for refill today: yes   Requested medication (s) are on the active medication list: yes   Last refill:  06/28/22 #30 0 refills  Future visit scheduled: no   Notes to clinic:  no refills remain. Do you want to refill Rx?     Requested Prescriptions  Pending Prescriptions Disp Refills   meloxicam (MOBIC) 7.5 MG tablet 30 tablet 0    Sig: Take 1 tablet (7.5 mg total) by mouth daily.     Analgesics:  COX2 Inhibitors Failed - 08/02/2022 11:41 AM      Failed - Manual Review: Labs are only required if the patient has taken medication for more than 8 weeks.      Passed - HGB in normal range and within 360 days    Hemoglobin  Date Value Ref Range Status  06/22/2022 14.9 13.0 - 17.0 g/dL Final   Total hemoglobin  Date Value Ref Range Status  03/19/2021 12.5 12.0 - 16.0 g/dL Final         Passed - Cr in normal range and within 360 days    Creatinine, Ser  Date Value Ref Range Status  06/22/2022 1.21 0.61 - 1.24 mg/dL Final         Passed - HCT in normal range and within 360 days    HCT  Date Value Ref Range Status  06/22/2022 46.9 39.0 - 52.0 % Final         Passed - AST in normal range and within 360 days    AST  Date Value Ref Range Status  06/22/2022 19 15 - 41 U/L Final         Passed - ALT in normal range and within 360 days    ALT  Date Value Ref Range Status  06/22/2022 22 0 - 44 U/L Final         Passed - eGFR is 30 or above and within 360 days    GFR calc Af Amer  Date Value Ref Range Status  11/16/2019 >60 >60 mL/min Final   GFR, Estimated  Date Value Ref Range Status  06/22/2022 >60 >60 mL/min Final    Comment:    (NOTE) Calculated using the CKD-EPI Creatinine Equation (2021)    eGFR  Date Value Ref Range Status  04/23/2021 48 (L) >59 mL/min/1.73 Final         Passed - Patient is not pregnant      Passed - Valid encounter within last 12 months    Recent Outpatient Visits           1 month ago Pain of right  side of body   [place], Michelle P, NP   4 months ago [place], [place], NP   7 months ago Prediabetes   [place], Michelle P, NP   9 months ago Localized edema   [facility] Kerin Perna, NP   1 year ago Radiculopathy, lumbosacral region   [place], [place], NP

## 2022-08-04 NOTE — Telephone Encounter (Signed)
Will forward to provider  

## 2022-08-06 ENCOUNTER — Other Ambulatory Visit: Payer: Self-pay

## 2022-08-06 MED ORDER — MELOXICAM 7.5 MG PO TABS
7.5000 mg | ORAL_TABLET | Freq: Every day | ORAL | 0 refills | Status: DC
  Filled 2022-08-06 – 2022-08-24 (×2): qty 30, 30d supply, fill #0

## 2022-08-11 ENCOUNTER — Other Ambulatory Visit: Payer: Self-pay

## 2022-08-11 NOTE — Progress Notes (Signed)
Patient outreached by Angus Seller, PharmD Candidate on 2/21 to discuss hypertension.   Patient does not have an automated home blood pressure machine, but is getting one. He is unsure what his readings are at home.   Attempted to perform medication review, but he was only concerned about the meloxicam and getting something for his arthritis.   The following barriers to adherence were noted:  - They do not have cost concerns.  - They do not have transportation concerns.  - They do not need assistance obtaining refills.  - They do not occasionally forget to take some of their prescribed medications. Patient stated that he was adherent and should be taking all his medications daily, but he did not necessarily know the names of the medications. His dispense report looks up to date.  - They do not feel like one/some of their medications make them feel poorly.  - They do have questions or concerns about their medications. He complains of worsening arthritis and needs a refill for his meloxicam.  - They do not have follow up scheduled with their primary care provider/cardiologist, but we are working to get it scheduled now.   The following interventions were completed:  - Patient was educated on medications, including indication and administration   The patient has follow up scheduled: 08/24/2022 PCP: Wallis Bamberg, PharmD Candidate  [place], Class of 2024   Joseph Art, [place].D. PGY-2 Ambulatory Care Pharmacy Resident

## 2022-08-12 ENCOUNTER — Other Ambulatory Visit: Payer: Self-pay

## 2022-08-24 ENCOUNTER — Ambulatory Visit (INDEPENDENT_AMBULATORY_CARE_PROVIDER_SITE_OTHER): Payer: Self-pay | Admitting: Primary Care

## 2022-08-24 ENCOUNTER — Other Ambulatory Visit (HOSPITAL_COMMUNITY): Payer: Self-pay

## 2022-08-24 ENCOUNTER — Other Ambulatory Visit (HOSPITAL_COMMUNITY)
Admission: RE | Admit: 2022-08-24 | Discharge: 2022-08-24 | Disposition: A | Discharge status: Home / Self Care | Payer: Self-pay | Source: Ambulatory Visit | Attending: Primary Care | Admitting: Primary Care

## 2022-08-24 ENCOUNTER — Telehealth: Payer: Self-pay | Admitting: Emergency Medicine

## 2022-08-24 ENCOUNTER — Other Ambulatory Visit: Payer: Self-pay

## 2022-08-24 ENCOUNTER — Encounter (INDEPENDENT_AMBULATORY_CARE_PROVIDER_SITE_OTHER): Payer: Self-pay | Admitting: Primary Care

## 2022-08-24 VITALS — BP 157/74 | HR 71 | Resp 16 | Wt 194.4 lb

## 2022-08-24 DIAGNOSIS — A64 Unspecified sexually transmitted disease: Secondary | ICD-10-CM | POA: Insufficient documentation

## 2022-08-24 DIAGNOSIS — G8929 Other chronic pain: Secondary | ICD-10-CM

## 2022-08-24 DIAGNOSIS — M545 Low back pain, unspecified: Secondary | ICD-10-CM

## 2022-08-24 DIAGNOSIS — I151 Hypertension secondary to other renal disorders: Secondary | ICD-10-CM

## 2022-08-24 NOTE — Progress Notes (Signed)
Logan Brewer, is a 67 y.o. male  [id number]  [id number]  DOB - 1956/05/18  Chief Complaint  Patient presents with   Back Pain       Subjective:   Logan Brewer is a 67 y.o. male here today for a continue back pain. C/o right side flank and side pain. He requested muscle relaxer- never picked up from the pharmacy!  Patient stated he went to the pharmacy and no medicine was there. Called pharmacy put back meloxicam.   BP elevated followed by cardiology. Patient has No headache, No chest pain, No abdominal pain - No Nausea, No new weakness tingling or numbness, No Cough - shortness of breath.Patient keep asking for a test explained he was not due for test - then he explained he slept with a woman no protection and doesn't feel well.   No problems updated.  No Known Allergies  Past Medical History:  Diagnosis Date   Hydrocele in adult    Hypertension    Inguinal hernia    Ischemic cardiomyopathy    Smoker 03/16/2021   smokes 1/2 ppd PTA per hx   STEMI involving left anterior descending coronary artery ([place]) 03/12/2021    Current Outpatient Medications on File Prior to Visit  Medication Sig Dispense Refill   amLODipine (NORVASC) 10 MG tablet Take 1 tablet (10 mg total) by mouth daily. 90 tablet 3   aspirin EC 81 MG tablet Take 1 tablet (81 mg total) by mouth daily. Swallow whole. 30 tablet 1   atorvastatin (LIPITOR) 80 MG tablet Take 1 tablet (80 mg total) by mouth daily. 90 tablet 0   carvedilol (COREG) 6.25 MG tablet Take 1 tablet (6.25 mg total) by mouth 2 (two) times daily with a meal. 180 tablet 3   dapagliflozin propanediol (FARXIGA) 10 MG TABS tablet Take 1 tablet (10 mg total) by mouth daily before breakfast. 90 tablet 3   ibuprofen (ADVIL) 600 MG tablet Take 1 tablet (600 mg total) by mouth every 8 (eight) hours as needed. 30 tablet 0   losartan (COZAAR) 25 MG tablet Take 1 tablet (25 mg total) by mouth daily. 30 tablet 0    meloxicam (MOBIC) 7.5 MG tablet Take 1 tablet (7.5 mg total) by mouth daily. 30 tablet 0   nitroGLYCERIN (NITROSTAT) 0.4 MG SL tablet Place 1 tablet (0.4 mg total) under the tongue every 5 (five) minutes as needed for chest pain. 25 tablet 1   spironolactone (ALDACTONE) 25 MG tablet Take 1 tablet (25 mg total) by mouth daily. 90 tablet 3   No current facility-administered medications on file prior to visit.    Objective:   Vitals:   08/24/22 1452  BP: (Abnormal) 157/74  Pulse: 71  Resp: 16  SpO2: 98%  Weight: 194 lb 6.4 oz (88.2 kg)    Comprehensive ROS Pertinent positive and negative noted in HPI   Exam General appearance : Awake, alert, not in any distress. Speech Clear. Not toxic looking HEENT: Atraumatic and Normocephalic, pupils equally reactive to light and accomodation Neck: Supple, no JVD. No cervical lymphadenopathy.  Chest: Good air entry bilaterally, no added sounds  CVS: S1 S2 regular, no murmurs.  Abdomen: Bowel sounds present, Non tender and not distended with no gaurding, rigidity or rebound. Extremities: B/L Lower Ext shows no edema, both legs are warm to touch Neurology: Awake alert, and oriented X 3,  Non focal Skin: No Rash  Data Review Lab Results  Component Value Date   HGBA1C  6.2 (H) 03/02/2022   HGBA1C 6.2 (A) 12/15/2021   HGBA1C 6.3 (A) 06/05/2021    Assessment & Plan  Richey was seen today for back pain.  Diagnoses and all orders for this visit:  STD (male) Urine cytology ancillary   Chronic bilateral low back pain, unspecified whether sciatica present See HPI.   Hypertension secondary to other renal disorders  Systolic elevated states he takes all his medication. Followed by Cardiology  Patient have been counseled extensively about nutrition and exercise. Other issues discussed during this visit include: low cholesterol diet, weight control and daily exercise, foot care, annual eye examinations at Ophthalmology, importance of adherence  with medications and regular follow-up. We also discussed long term complications of uncontrolled diabetes and hypertension.   No follow-ups on file.  The patient was given clear instructions to go to ER or return to medical center if symptoms don't improve, worsen or new problems develop. The patient verbalized understanding. The patient was told to call to get lab results if they haven't heard anything in the next week.   This note has been created with Surveyor, quantity. Any transcriptional errors are unintentional.   Logan Perna, NP 08/24/2022, 3:21 PM

## 2022-08-24 NOTE — Telephone Encounter (Signed)
Copied from [place] [id number]. Topic: General - Other >> Aug 24, 2022  3:51 PM Dominique A wrote: Reason for CRM: Pt states that he just seen PCP today and was prescribed medication for Arthritis  and a muscle relaxer. Pt is at the pharmacy and the pharmacy does not have the prescription. Please advise.

## 2022-08-26 LAB — URINE CYTOLOGY ANCILLARY ONLY
Bacterial Vaginitis-Urine: NEGATIVE
Candida Urine: NEGATIVE
Chlamydia: NEGATIVE
Comment: NEGATIVE
Comment: NEGATIVE
Comment: NORMAL
Neisseria Gonorrhea: NEGATIVE
Trichomonas: NEGATIVE

## 2022-08-27 ENCOUNTER — Other Ambulatory Visit (INDEPENDENT_AMBULATORY_CARE_PROVIDER_SITE_OTHER): Payer: Self-pay | Admitting: Primary Care

## 2022-08-27 ENCOUNTER — Other Ambulatory Visit (HOSPITAL_COMMUNITY): Payer: Self-pay

## 2022-08-27 DIAGNOSIS — R52 Pain, unspecified: Secondary | ICD-10-CM

## 2022-08-27 MED ORDER — MELOXICAM 7.5 MG PO TABS
7.5000 mg | ORAL_TABLET | Freq: Every day | ORAL | 0 refills | Status: DC
  Filled 2022-08-27 – 2022-09-28 (×2): qty 30, 30d supply, fill #0

## 2022-08-27 NOTE — Telephone Encounter (Signed)
Requested Prescriptions  Pending Prescriptions Disp Refills   meloxicam (MOBIC) 7.5 MG tablet 30 tablet 0    Sig: Take 1 tablet (7.5 mg total) by mouth daily.     Analgesics:  COX2 Inhibitors Failed - 08/27/2022  3:19 PM      Failed - Manual Review: Labs are only required if the patient has taken medication for more than 8 weeks.      Passed - HGB in normal range and within 360 days    Hemoglobin  Date Value Ref Range Status  06/22/2022 14.9 13.0 - 17.0 g/dL Final   Total hemoglobin  Date Value Ref Range Status  03/19/2021 12.5 12.0 - 16.0 g/dL Final         Passed - Cr in normal range and within 360 days    Creatinine, Ser  Date Value Ref Range Status  06/22/2022 1.21 0.61 - 1.24 mg/dL Final         Passed - HCT in normal range and within 360 days    HCT  Date Value Ref Range Status  06/22/2022 46.9 39.0 - 52.0 % Final         Passed - AST in normal range and within 360 days    AST  Date Value Ref Range Status  06/22/2022 19 15 - 41 U/L Final         Passed - ALT in normal range and within 360 days    ALT  Date Value Ref Range Status  06/22/2022 22 0 - 44 U/L Final         Passed - eGFR is 30 or above and within 360 days    GFR calc Af Amer  Date Value Ref Range Status  11/16/2019 >60 >60 mL/min Final   GFR, Estimated  Date Value Ref Range Status  06/22/2022 >60 >60 mL/min Final    Comment:    (NOTE) Calculated using the CKD-EPI Creatinine Equation (2021)    eGFR  Date Value Ref Range Status  04/23/2021 48 (L) >59 mL/min/1.73 Final         Passed - Patient is not pregnant      Passed - Valid encounter within last 12 months    Recent Outpatient Visits           3 days ago STD (male)   [place], Michelle P, NP   2 months ago Pain of right side of body   [place], Michelle P, NP   5 months ago [place],  [place], NP   8 months ago Prediabetes   [place], Michelle P, NP   9 months ago Localized edema   [facility] Kerin Perna, NP       Future Appointments             In 2 months Oletta Lamas, Milford Cage, NP [place]

## 2022-08-28 ENCOUNTER — Other Ambulatory Visit (HOSPITAL_COMMUNITY): Payer: Self-pay

## 2022-08-30 ENCOUNTER — Telehealth (INDEPENDENT_AMBULATORY_CARE_PROVIDER_SITE_OTHER): Payer: Self-pay | Admitting: *Deleted

## 2022-08-30 ENCOUNTER — Other Ambulatory Visit (HOSPITAL_COMMUNITY): Payer: Self-pay

## 2022-08-30 NOTE — Telephone Encounter (Signed)
Patient notified:  STI negative-use condoms

## 2022-09-08 ENCOUNTER — Other Ambulatory Visit (HOSPITAL_COMMUNITY): Payer: Self-pay

## 2022-09-08 ENCOUNTER — Other Ambulatory Visit (HOSPITAL_COMMUNITY): Payer: Self-pay | Admitting: Internal Medicine

## 2022-09-09 ENCOUNTER — Other Ambulatory Visit (HOSPITAL_COMMUNITY): Payer: Self-pay

## 2022-09-09 MED ORDER — LOSARTAN POTASSIUM 25 MG PO TABS
25.0000 mg | ORAL_TABLET | Freq: Every day | ORAL | 0 refills | Status: DC
  Filled 2022-09-09: qty 30, 30d supply, fill #0

## 2022-09-10 ENCOUNTER — Other Ambulatory Visit (HOSPITAL_COMMUNITY): Payer: Self-pay

## 2022-09-14 ENCOUNTER — Other Ambulatory Visit (HOSPITAL_COMMUNITY): Payer: Self-pay

## 2022-09-28 ENCOUNTER — Other Ambulatory Visit: Payer: Self-pay

## 2022-09-28 ENCOUNTER — Other Ambulatory Visit (HOSPITAL_COMMUNITY): Payer: Self-pay

## 2022-09-28 ENCOUNTER — Other Ambulatory Visit (HOSPITAL_COMMUNITY): Payer: Self-pay | Admitting: Internal Medicine

## 2022-09-28 DIAGNOSIS — I151 Hypertension secondary to other renal disorders: Secondary | ICD-10-CM

## 2022-09-28 MED ORDER — CARVEDILOL 6.25 MG PO TABS
6.2500 mg | ORAL_TABLET | Freq: Two times a day (BID) | ORAL | 0 refills | Status: DC
  Filled 2022-09-28: qty 60, 30d supply, fill #0

## 2022-10-01 ENCOUNTER — Other Ambulatory Visit (HOSPITAL_COMMUNITY): Payer: Self-pay

## 2022-10-15 ENCOUNTER — Other Ambulatory Visit (HOSPITAL_COMMUNITY): Payer: Self-pay | Admitting: Internal Medicine

## 2022-10-15 ENCOUNTER — Other Ambulatory Visit (HOSPITAL_COMMUNITY): Payer: Self-pay | Admitting: Family Medicine

## 2022-10-15 ENCOUNTER — Other Ambulatory Visit (HOSPITAL_COMMUNITY): Payer: Self-pay

## 2022-10-15 ENCOUNTER — Other Ambulatory Visit (INDEPENDENT_AMBULATORY_CARE_PROVIDER_SITE_OTHER): Payer: Self-pay | Admitting: Primary Care

## 2022-10-15 ENCOUNTER — Other Ambulatory Visit: Payer: Self-pay

## 2022-10-15 DIAGNOSIS — R52 Pain, unspecified: Secondary | ICD-10-CM

## 2022-10-15 DIAGNOSIS — I151 Hypertension secondary to other renal disorders: Secondary | ICD-10-CM

## 2022-10-15 DIAGNOSIS — E782 Mixed hyperlipidemia: Secondary | ICD-10-CM

## 2022-10-15 MED ORDER — ATORVASTATIN CALCIUM 80 MG PO TABS
80.0000 mg | ORAL_TABLET | Freq: Every day | ORAL | 0 refills | Status: DC
  Filled 2022-10-15 – 2022-11-05 (×2): qty 30, 30d supply, fill #0

## 2022-10-15 MED ORDER — CARVEDILOL 6.25 MG PO TABS
6.2500 mg | ORAL_TABLET | Freq: Two times a day (BID) | ORAL | 0 refills | Status: DC
  Filled 2022-10-15 – 2022-11-05 (×2): qty 60, 30d supply, fill #0

## 2022-10-15 MED ORDER — MELOXICAM 7.5 MG PO TABS
7.5000 mg | ORAL_TABLET | Freq: Every day | ORAL | 0 refills | Status: DC
  Filled 2022-10-15 – 2022-11-05 (×2): qty 30, 30d supply, fill #0

## 2022-10-18 ENCOUNTER — Telehealth (HOSPITAL_COMMUNITY): Payer: Self-pay | Admitting: Vascular Surgery

## 2022-10-18 ENCOUNTER — Other Ambulatory Visit: Payer: Self-pay

## 2022-10-18 DIAGNOSIS — I10 Essential (primary) hypertension: Secondary | ICD-10-CM

## 2022-10-18 MED ORDER — SPIRONOLACTONE 25 MG PO TABS
25.0000 mg | ORAL_TABLET | Freq: Every day | ORAL | 3 refills | Status: DC
  Filled 2022-10-18: qty 90, 90d supply, fill #0
  Filled 2022-10-19: qty 30, 30d supply, fill #0
  Filled 2022-11-25: qty 30, 30d supply, fill #1
  Filled 2023-01-07: qty 30, 30d supply, fill #2
  Filled 2023-02-08: qty 30, 30d supply, fill #3
  Filled 2023-03-04: qty 30, 30d supply, fill #4
  Filled 2023-04-13: qty 30, 30d supply, fill #5
  Filled 2023-05-13: qty 30, 30d supply, fill #6
  Filled 2023-07-12 – 2023-08-08 (×2): qty 30, 30d supply, fill #7

## 2022-10-18 MED ORDER — AMLODIPINE BESYLATE 10 MG PO TABS
10.0000 mg | ORAL_TABLET | Freq: Every day | ORAL | 3 refills | Status: DC
  Filled 2022-10-18: qty 90, 90d supply, fill #0
  Filled 2022-10-19: qty 30, 30d supply, fill #0
  Filled 2022-11-25: qty 30, 30d supply, fill #1
  Filled 2023-01-07: qty 30, 30d supply, fill #2
  Filled 2023-02-08: qty 30, 30d supply, fill #3
  Filled 2023-04-13: qty 30, 30d supply, fill #4
  Filled 2023-05-13: qty 30, 30d supply, fill #5

## 2022-10-18 NOTE — Telephone Encounter (Signed)
PT need refill amlodipine and spiro, pt has appt w/ db 5/7 @ 9 am

## 2022-10-18 NOTE — Telephone Encounter (Signed)
Meds ordered this encounter  Medications   amLODipine (NORVASC) 10 MG tablet    Sig: Take 1 tablet (10 mg total) by mouth daily.    Dispense:  90 tablet    Refill:  3    Hf fund   spironolactone (ALDACTONE) 25 MG tablet    Sig: Take 1 tablet (25 mg total) by mouth daily.    Dispense:  90 tablet    Refill:  3    Hf fund   Done

## 2022-10-19 ENCOUNTER — Other Ambulatory Visit (HOSPITAL_COMMUNITY): Payer: Self-pay

## 2022-10-19 ENCOUNTER — Other Ambulatory Visit: Payer: Self-pay

## 2022-10-20 ENCOUNTER — Other Ambulatory Visit: Payer: Self-pay

## 2022-10-25 NOTE — Progress Notes (Signed)
ADVANCED HF CLINIC NOTE   Primary Care: Grayce Sessions, NP HF Cardiologist: Dr. Gala Romney  HPI:  Logan Brewer is a 67 y.o. male with history of uncontrolled HTN and heavy, longstanding tobacco use, systolic heart failure, CAD, and VT/VF arrest.   Had VT/VF arrest in 9/22. ECG with anterior STEMI. Cath 99% ostial to proximal LAD treated with PCI/DES. Echo with EF up to 40%.   Readmitted 10/22 with CP and SOB. Repeat echo EF 40-45%, Hstrop negative. Found to have AKI and GDMT held at discharge.   He no showed and cancelled post hospital follow up x 3.  Last seen 09/17/21.   Echo 3/23 EF 40%  CPX 4/23  with submax test.    FVC 2.27 (66%)      FEV1 1.64 (60%)        FEV1/FVC 72 (92%)         Resting HR: 83 Peak HR: 118   (75% age predicted max HR)  BP rest: 126/64 BP peak: 206/68  Peak VO2: 16.8 (67% predicted peak VO2)  VE/VCO2 slope:  42  Peak RER: 0.86  PETCO2 at peak:  26       Cardiac Studies:  - Ltd Echo (10/22): EF 40-45%, moderate LV dysfunction, mild LVH, RV ok, - Echo 03/12/21 EF 20-25%, akinesis of anteroseptal wall and apex, RV okay, no MR, IVC normal in size  - R/LHC 9/22:  severe single vessel CAD, 95-99% ostial LAD s/p PCI + DES. EF 20-25%. RV normal. No MR, moderate pulm hypertension  .  Past Medical History:  Diagnosis Date   Hydrocele in adult    Hypertension    Inguinal hernia    Ischemic cardiomyopathy    Smoker 03/16/2021   smokes 1/2 ppd PTA per hx   STEMI involving left anterior descending coronary artery ([facility]) 03/12/2021   Current Outpatient Medications  Medication Sig Dispense Refill   amLODipine (NORVASC) 10 MG tablet Take 1 tablet (10 mg total) by mouth daily. 90 tablet 3   aspirin EC 81 MG tablet Take 1 tablet (81 mg total) by mouth daily. Swallow whole. 30 tablet 1   atorvastatin (LIPITOR) 80 MG tablet Take 1 tablet (80 mg total) by mouth daily. Absolute last refill without office visit please call [phone] 30 tablet 0    carvedilol (COREG) 6.25 MG tablet Take 1 tablet (6.25 mg total) by mouth 2 (two) times daily with a meal. Absolute last refill without office visit please call [phone] 60 tablet 0   dapagliflozin propanediol (FARXIGA) 10 MG TABS tablet Take 1 tablet (10 mg total) by mouth daily before breakfast. 90 tablet 3   ibuprofen (ADVIL) 600 MG tablet Take 1 tablet (600 mg total) by mouth every 8 (eight) hours as needed. 30 tablet 0   losartan (COZAAR) 25 MG tablet Take 1 tablet (25 mg total) by mouth daily. **need follow up visit for more refills** 30 tablet 0   meloxicam (MOBIC) 7.5 MG tablet Take 1 tablet (7.5 mg total) by mouth daily. 30 tablet 0   nitroGLYCERIN (NITROSTAT) 0.4 MG SL tablet Place 1 tablet (0.4 mg total) under the tongue every 5 (five) minutes as needed for chest pain. 25 tablet 1   spironolactone (ALDACTONE) 25 MG tablet Take 1 tablet (25 mg total) by mouth daily. 90 tablet 3   No current facility-administered medications for this encounter.   No Known Allergies  Social History   Socioeconomic History   Marital status: Single    Spouse name:  Not on file   Number of children: Not on file   Years of education: Not on file   Highest education level: Not on file  Occupational History   Not on file  Tobacco Use   Smoking status: Former    Packs/day: .5    Types: Cigarettes   Smokeless tobacco: Never  Vaping Use   Vaping Use: Never used  Substance and Sexual Activity   Alcohol use: No   Drug use: Yes    Types: Marijuana    Comment: occasionally    Sexual activity: Not on file  Other Topics Concern   Not on file  Social History Narrative   ** Merged History Encounter **       Social Determinants of Health   Financial Resource Strain: Low Risk  (09/17/2021)   Overall Financial Resource Strain (CARDIA)    Difficulty of Paying Living Expenses: Not very hard  Food Insecurity: No Food Insecurity (09/17/2021)   Hunger Vital Sign    Worried About Running Out of Food  in the Last Year: Never true    Ran Out of Food in the Last Year: Never true  Transportation Needs: No Transportation Needs (09/17/2021)   PRAPARE - Administrator, Civil Service (Medical): No    Lack of Transportation (Non-Medical): No  Physical Activity: Not on file  Stress: Not on file  Social Connections: Not on file  Intimate Partner Violence: Not on file   Family History  Problem Relation Age of Onset   Heart disease Neg Hx    There were no vitals taken for this visit.  Wt Readings from Last 3 Encounters:  08/24/22 88.2 kg (194 lb 6.4 oz)  04/29/22 83.5 kg (184 lb)  03/24/22 83.9 kg (185 lb)   PHYSICAL EXAM: General:  Well appearing. No resp difficulty HEENT: normal Neck: supple. no JVD. Carotids 2+ bilat; no bruits. No lymphadenopathy or thryomegaly appreciated. Cor: PMI nondisplaced. Regular rate & rhythm. No rubs, gallops or murmurs. Lungs: clear Abdomen: soft, nontender, nondistended. No hepatosplenomegaly. No bruits or masses. Good bowel sounds. Extremities: no cyanosis, clubbing, rash, tr edema Neuro: alert & orientedx3, cranial nerves grossly intact. moves all 4 extremities w/o difficulty. Affect pleasant   ASSESSMENT & PLAN:  1. Chronic Systolic Heart Failure - Secondary to anterior ST elevation MI/OOH arrest in 9/22 - Initial echo EF 20-25% with akinesis of anteroseptal wall and apex - Bedside echo EF 35-40% 03/14/21.  - Repeat Limited echo 03/16/21 w/ improved EF, 40-45%   - Echo 09/17/21 LVEF 40%.  - CPX 4/23  with submax test . FVC 2.27 (66%) FEV1 1.64 (60%)  pVO2: 16.8 (67%) VE/VCO2 42 pRER: 0.86  PETCO2 26  - Stable NYHA III today - Reds Clip 35%. Add farxiga 10 mg daily. (Will need med assistance) - Continue Entresto 24/26 mg bid. - Continue carvedilol 6.25 bid - Continue spironolactone 25 gm daily.  - Check BMET, BNP -   2. CAD - s/p PCI/DES to LAD as above - Continue DAPT with aspirin and ticagrelor. - No s/s angina - Continue statin  and ? blocker.   3. H/o VT/VF arrest: - 2/2 #2 - Off amio  - EF 35-40%. Does not qualify for ICD   4. Tobacco use: - Quit completely   Dolores Patty MD

## 2022-10-26 ENCOUNTER — Encounter (HOSPITAL_COMMUNITY): Payer: Self-pay | Admitting: Internal Medicine

## 2022-10-26 ENCOUNTER — Other Ambulatory Visit: Payer: Self-pay

## 2022-10-26 ENCOUNTER — Other Ambulatory Visit (HOSPITAL_COMMUNITY): Payer: Self-pay

## 2022-10-26 ENCOUNTER — Ambulatory Visit (HOSPITAL_COMMUNITY)
Admission: RE | Admit: 2022-10-26 | Discharge: 2022-10-26 | Disposition: A | Discharge status: Home / Self Care | Payer: Self-pay | Source: Ambulatory Visit | Attending: Internal Medicine | Admitting: Internal Medicine

## 2022-10-26 VITALS — BP 140/70 | HR 69 | Wt 193.6 lb

## 2022-10-26 DIAGNOSIS — Z720 Tobacco use: Secondary | ICD-10-CM

## 2022-10-26 DIAGNOSIS — I25119 Atherosclerotic heart disease of native coronary artery with unspecified angina pectoris: Secondary | ICD-10-CM

## 2022-10-26 DIAGNOSIS — Z8674 Personal history of sudden cardiac arrest: Secondary | ICD-10-CM | POA: Insufficient documentation

## 2022-10-26 DIAGNOSIS — Z7982 Long term (current) use of aspirin: Secondary | ICD-10-CM | POA: Insufficient documentation

## 2022-10-26 DIAGNOSIS — I5022 Chronic systolic (congestive) heart failure: Secondary | ICD-10-CM | POA: Insufficient documentation

## 2022-10-26 DIAGNOSIS — Z8249 Family history of ischemic heart disease and other diseases of the circulatory system: Secondary | ICD-10-CM | POA: Insufficient documentation

## 2022-10-26 DIAGNOSIS — Z79899 Other long term (current) drug therapy: Secondary | ICD-10-CM | POA: Insufficient documentation

## 2022-10-26 DIAGNOSIS — Z87891 Personal history of nicotine dependence: Secondary | ICD-10-CM | POA: Insufficient documentation

## 2022-10-26 DIAGNOSIS — I252 Old myocardial infarction: Secondary | ICD-10-CM | POA: Insufficient documentation

## 2022-10-26 DIAGNOSIS — I2510 Atherosclerotic heart disease of native coronary artery without angina pectoris: Secondary | ICD-10-CM | POA: Insufficient documentation

## 2022-10-26 DIAGNOSIS — I110 Hypertensive heart disease with heart failure: Secondary | ICD-10-CM | POA: Insufficient documentation

## 2022-10-26 DIAGNOSIS — I10 Essential (primary) hypertension: Secondary | ICD-10-CM

## 2022-10-26 DIAGNOSIS — Z955 Presence of coronary angioplasty implant and graft: Secondary | ICD-10-CM | POA: Insufficient documentation

## 2022-10-26 LAB — COMPREHENSIVE METABOLIC PANEL
ALT: 24 U/L (ref 0–44)
AST: 21 U/L (ref 15–41)
Albumin: 3.7 g/dL (ref 3.5–5.0)
Alkaline Phosphatase: 66 U/L (ref 38–126)
Anion gap: 10 (ref 5–15)
BUN: 17 mg/dL (ref 8–23)
CO2: 25 mmol/L (ref 22–32)
Calcium: 9.1 mg/dL (ref 8.9–10.3)
Chloride: 101 mmol/L (ref 98–111)
Creatinine, Ser: 1.22 mg/dL (ref 0.61–1.24)
GFR, Estimated: >60 mL/min (ref >60)
Glucose, Bld: 122 mg/dL — ABNORMAL HIGH (ref 70–99)
Potassium: 3.7 mmol/L (ref 3.5–5.1)
Sodium: 136 mmol/L (ref 135–145)
Total Bilirubin: 0.6 mg/dL (ref 0.3–1.2)
Total Protein: 7.3 g/dL (ref 6.5–8.1)

## 2022-10-26 LAB — LIPID PANEL
Cholesterol: 110 mg/dL (ref 0–200)
HDL: 42 mg/dL (ref >40)
LDL Cholesterol: 58 mg/dL (ref 0–99)
Total CHOL/HDL Ratio: 2.6 RATIO
Triglycerides: 48 mg/dL (ref <150)
VLDL: 10 mg/dL (ref 0–40)

## 2022-10-26 LAB — BRAIN NATRIURETIC PEPTIDE: B Natriuretic Peptide: 65.6 pg/mL (ref 0.0–100.0)

## 2022-10-26 MED ORDER — LOSARTAN POTASSIUM 50 MG PO TABS
50.0000 mg | ORAL_TABLET | Freq: Every day | ORAL | 3 refills | Status: DC
  Filled 2022-10-26 (×2): qty 90, 90d supply, fill #0
  Filled 2023-02-08: qty 90, 90d supply, fill #1
  Filled 2023-05-13: qty 30, 30d supply, fill #2

## 2022-10-26 NOTE — Addendum Note (Signed)
Encounter addended by: Linda Hedges, RN on: 10/26/2022 9:36 AM  Actions taken: Pharmacy for encounter modified, Order list changed, Diagnosis association updated, Clinical Note Signed, Charge Capture section accepted

## 2022-10-26 NOTE — Patient Instructions (Signed)
INCREASE Losartan to 50 mg daily.  Labs done today, your results will be available in MyChart, we will contact you for abnormal readings.  Your physician has requested that you have an echocardiogram. Echocardiography is a painless test that uses sound waves to create images of your heart. It provides your doctor with information about the size and shape of your heart and how well your heart's chambers and valves are working. This procedure takes approximately one hour. There are no restrictions for this procedure. Please do NOT wear cologne, perfume, aftershave, or lotions (deodorant is allowed). Please arrive 15 minutes prior to your appointment time.  Your physician recommends that you schedule a follow-up appointment in: 6 months with an echocardiogram (November)    If you have any questions or concerns before your next appointment please send [place] a message through [place] or call our office at [phone].    TO LEAVE A MESSAGE FOR THE NURSE SELECT OPTION 2, PLEASE LEAVE A MESSAGE INCLUDING: YOUR NAME DATE OF BIRTH CALL BACK NUMBER REASON FOR CALL**this is important as we prioritize the call backs  YOU WILL RECEIVE A CALL BACK THE SAME DAY AS LONG AS YOU CALL BEFORE 4:00 PM  At the [facility], you and your health needs are our priority. As part of our continuing mission to provide you with exceptional heart care, we have created designated Provider Care Teams. These Care Teams include your primary Cardiologist (physician) and Advanced Practice Providers (APPs- Physician Assistants and Nurse Practitioners) who all work together to provide you with the care you need, when you need it.   You may see any of the following providers on your designated Care Team at your next follow up: Dr Arvilla Meres Dr Marca Ancona Dr. Marcos Eke, NP Robbie Lis, [place] [facility] [place], [place] Brynda Peon, NP Karle Plumber, PharmD   Please be sure  to bring in all your medications bottles to every appointment.    Thank you for choosing [facility]

## 2022-11-05 ENCOUNTER — Other Ambulatory Visit (HOSPITAL_COMMUNITY): Payer: Self-pay

## 2022-11-24 ENCOUNTER — Ambulatory Visit (INDEPENDENT_AMBULATORY_CARE_PROVIDER_SITE_OTHER): Payer: Self-pay | Admitting: Primary Care

## 2022-11-25 ENCOUNTER — Other Ambulatory Visit (HOSPITAL_COMMUNITY): Payer: Self-pay

## 2022-12-13 ENCOUNTER — Other Ambulatory Visit (HOSPITAL_COMMUNITY): Payer: Self-pay

## 2022-12-13 ENCOUNTER — Other Ambulatory Visit (HOSPITAL_COMMUNITY): Payer: Self-pay | Admitting: Internal Medicine

## 2022-12-13 DIAGNOSIS — E782 Mixed hyperlipidemia: Secondary | ICD-10-CM

## 2022-12-13 MED ORDER — ATORVASTATIN CALCIUM 80 MG PO TABS
80.0000 mg | ORAL_TABLET | Freq: Every day | ORAL | 3 refills | Status: DC
  Filled 2022-12-13: qty 30, 30d supply, fill #0
  Filled 2023-01-26 (×2): qty 30, 30d supply, fill #1
  Filled 2023-03-04: qty 30, 30d supply, fill #2
  Filled 2023-04-13: qty 30, 30d supply, fill #3
  Filled 2023-05-13: qty 30, 30d supply, fill #4
  Filled 2023-07-12: qty 30, 30d supply, fill #5

## 2022-12-20 ENCOUNTER — Other Ambulatory Visit (HOSPITAL_COMMUNITY): Payer: Self-pay

## 2022-12-24 ENCOUNTER — Telehealth (HOSPITAL_COMMUNITY): Payer: Self-pay | Admitting: Pharmacy Technician

## 2022-12-24 ENCOUNTER — Other Ambulatory Visit (HOSPITAL_COMMUNITY): Payer: Self-pay

## 2022-12-24 MED ORDER — TICAGRELOR 90 MG PO TABS: 90.0000 mg | ORAL_TABLET | Freq: Two times a day (BID) | ORAL | 11 refills | Status: DC

## 2022-12-24 NOTE — Telephone Encounter (Signed)
Patient assistance renewal is needed for Logan Brewer  Will confirm with provider if pt is to continue at this time. Previous discontinue order with note Contraindicated in patients with history of intracranial hemorrhage. 03/03/22  However OV 03/12/22 [name] notes  Coronary artery disease -Continue aspirin, Brilinta, atorvastatin, carvedilol, Aldactone, losartan

## 2022-12-24 NOTE — Telephone Encounter (Signed)
Advanced Heart Failure Patient Advocate Encounter  Received renewal notification from [employer] for [place] assistance. Called patient, call did not go through. Will try again.

## 2022-12-24 NOTE — Telephone Encounter (Signed)
Noted  Brilinta added to med list

## 2022-12-30 ENCOUNTER — Encounter (INDEPENDENT_AMBULATORY_CARE_PROVIDER_SITE_OTHER): Payer: Self-pay | Admitting: Primary Care

## 2022-12-30 ENCOUNTER — Ambulatory Visit (INDEPENDENT_AMBULATORY_CARE_PROVIDER_SITE_OTHER): Payer: Self-pay | Admitting: Primary Care

## 2022-12-30 VITALS — BP 133/73 | HR 72 | Resp 16 | Wt 187.8 lb

## 2022-12-30 DIAGNOSIS — M7989 Other specified soft tissue disorders: Secondary | ICD-10-CM

## 2023-01-03 NOTE — Progress Notes (Unsigned)
[facility]  Logan Brewer, is a 67 y.o. male  [id number]  [id number]  DOB - 02-14-1956  Chief Complaint  Patient presents with   Foot Swelling    Both  Swelling started last week for a couple of days        Subjective:   Logan Brewer is a 67 y.o. male here today for a follow up visit. Patient has No headache, No chest pain, No abdominal pain - No Nausea, No new weakness tingling or numbness, No Cough - shortness of breath  No problems updated.  No Known Allergies  Past Medical History:  Diagnosis Date   Hydrocele in adult    Hypertension    Inguinal hernia    Ischemic cardiomyopathy    Smoker 03/16/2021   smokes 1/2 ppd PTA per hx   STEMI involving left anterior descending coronary artery ([facility]) 03/12/2021    Current Outpatient Medications on File Prior to Visit  Medication Sig Dispense Refill   amLODipine (NORVASC) 10 MG tablet Take 1 tablet (10 mg total) by mouth daily. 90 tablet 3   aspirin EC 81 MG tablet Take 1 tablet (81 mg total) by mouth daily. Swallow whole. 30 tablet 1   atorvastatin (LIPITOR) 80 MG tablet Take 1 tablet (80 mg total) by mouth daily. 90 tablet 3   carvedilol (COREG) 6.25 MG tablet Take 1 tablet (6.25 mg total) by mouth 2 (two) times daily with a meal. Absolute last refill without office visit please call [phone] 60 tablet 0   dapagliflozin propanediol (FARXIGA) 10 MG TABS tablet Take 1 tablet (10 mg total) by mouth daily before breakfast. 90 tablet 3   ibuprofen (ADVIL) 600 MG tablet Take 1 tablet (600 mg total) by mouth every 8 (eight) hours as needed. 30 tablet 0   losartan (COZAAR) 50 MG tablet Take 1 tablet (50 mg total) by mouth daily. 90 tablet 3   meloxicam (MOBIC) 7.5 MG tablet Take 1 tablet (7.5 mg total) by mouth daily. 30 tablet 0   nitroGLYCERIN (NITROSTAT) 0.4 MG SL tablet Place 1 tablet (0.4 mg total) under the tongue every 5 (five) minutes as needed for chest pain. 25 tablet 1   spironolactone  (ALDACTONE) 25 MG tablet Take 1 tablet (25 mg total) by mouth daily. 90 tablet 3   ticagrelor (BRILINTA) 90 MG TABS tablet Take 1 tablet (90 mg total) by mouth 2 (two) times daily. 60 tablet 11   No current facility-administered medications on file prior to visit.    Objective:   Vitals:   12/30/22 1518  BP: 133/73  Pulse: 72  Resp: 16  SpO2: 97%  Weight: 187 lb 12.8 oz (85.2 kg)    Comprehensive ROS Pertinent positive and negative noted in HPI   Exam General appearance : Awake, alert, not in any distress. Speech Clear. Not toxic looking HEENT: Atraumatic and Normocephalic, pupils equally reactive to light and accomodation Neck: Supple, no JVD. No cervical lymphadenopathy.  Chest: Good air entry bilaterally, no added sounds  CVS: S1 S2 regular, no murmurs.  Abdomen: Bowel sounds present, Non tender and not distended with no gaurding, rigidity or rebound. Extremities: B/L Lower Ext shows no edema, both legs are warm to touch Neurology: Awake alert, and oriented X 3, CN II-XII intact, Non focal Skin: No Rash  Data Review Lab Results  Component Value Date   HGBA1C 6.2 (H) 03/02/2022   HGBA1C 6.2 (A) 12/15/2021   HGBA1C 6.3 (A) 06/05/2021    Assessment & Plan  There are no diagnoses linked to this encounter. There are no diagnoses linked to this encounter.   Patient have been counseled extensively about nutrition and exercise. Other issues discussed during this visit include: low cholesterol diet, weight control and daily exercise, foot care, annual eye examinations at Ophthalmology, importance of adherence with medications and regular follow-up. We also discussed long term complications of uncontrolled diabetes and hypertension.   No follow-ups on file.  The patient was given clear instructions to go to ER or return to medical center if symptoms don't improve, worsen or new problems develop. The patient verbalized understanding. The patient was told to call to get lab  results if they haven't heard anything in the next week.   This note has been created with Education officer, environmental. Any transcriptional errors are unintentional.   Grayce Sessions, NP 01/03/2023, 8:01 PM

## 2023-01-05 ENCOUNTER — Other Ambulatory Visit: Payer: Self-pay

## 2023-01-07 ENCOUNTER — Other Ambulatory Visit (HOSPITAL_COMMUNITY): Payer: Self-pay

## 2023-01-07 ENCOUNTER — Other Ambulatory Visit: Payer: Self-pay

## 2023-01-12 ENCOUNTER — Other Ambulatory Visit: Payer: Self-pay

## 2023-01-12 MED ORDER — FUROSEMIDE 20 MG PO TABS
20.0000 mg | ORAL_TABLET | Freq: Every day | ORAL | 11 refills | Status: DC
  Filled 2023-01-12 – 2023-01-14 (×2): qty 30, 30d supply, fill #0
  Filled 2023-02-15: qty 30, 30d supply, fill #1
  Filled 2023-03-04: qty 30, 30d supply, fill #2
  Filled 2023-04-13: qty 30, 30d supply, fill #3
  Filled 2023-05-13: qty 30, 30d supply, fill #4

## 2023-01-14 ENCOUNTER — Other Ambulatory Visit: Payer: Self-pay

## 2023-01-14 ENCOUNTER — Other Ambulatory Visit (HOSPITAL_COMMUNITY): Payer: Self-pay

## 2023-01-19 ENCOUNTER — Other Ambulatory Visit: Payer: Self-pay

## 2023-01-26 ENCOUNTER — Other Ambulatory Visit (HOSPITAL_COMMUNITY): Payer: Self-pay

## 2023-02-08 ENCOUNTER — Other Ambulatory Visit (HOSPITAL_COMMUNITY): Payer: Self-pay

## 2023-02-15 ENCOUNTER — Other Ambulatory Visit: Payer: Self-pay

## 2023-02-15 ENCOUNTER — Other Ambulatory Visit (HOSPITAL_COMMUNITY): Payer: Self-pay

## 2023-02-15 ENCOUNTER — Other Ambulatory Visit (HOSPITAL_COMMUNITY): Payer: Self-pay | Admitting: Internal Medicine

## 2023-02-15 DIAGNOSIS — I151 Hypertension secondary to other renal disorders: Secondary | ICD-10-CM

## 2023-02-16 ENCOUNTER — Other Ambulatory Visit (HOSPITAL_COMMUNITY): Payer: Self-pay

## 2023-02-16 MED ORDER — CARVEDILOL 6.25 MG PO TABS
6.2500 mg | ORAL_TABLET | Freq: Two times a day (BID) | ORAL | 0 refills | Status: DC
  Filled 2023-02-16 – 2023-03-04 (×2): qty 60, 30d supply, fill #0

## 2023-02-28 ENCOUNTER — Other Ambulatory Visit (HOSPITAL_COMMUNITY): Payer: Self-pay

## 2023-03-04 ENCOUNTER — Other Ambulatory Visit (HOSPITAL_COMMUNITY): Payer: Self-pay

## 2023-04-04 IMAGING — DX DG CHEST 1V PORT
1 series · 1 of 1 positions shown · non-contrast
Comparison: March 16, 2021.

CLINICAL DATA: Left-sided chest pain that radiates to the left arm,
shortness of breath on exertion.

EXAM:
PORTABLE CHEST 1 VIEW

[chest]
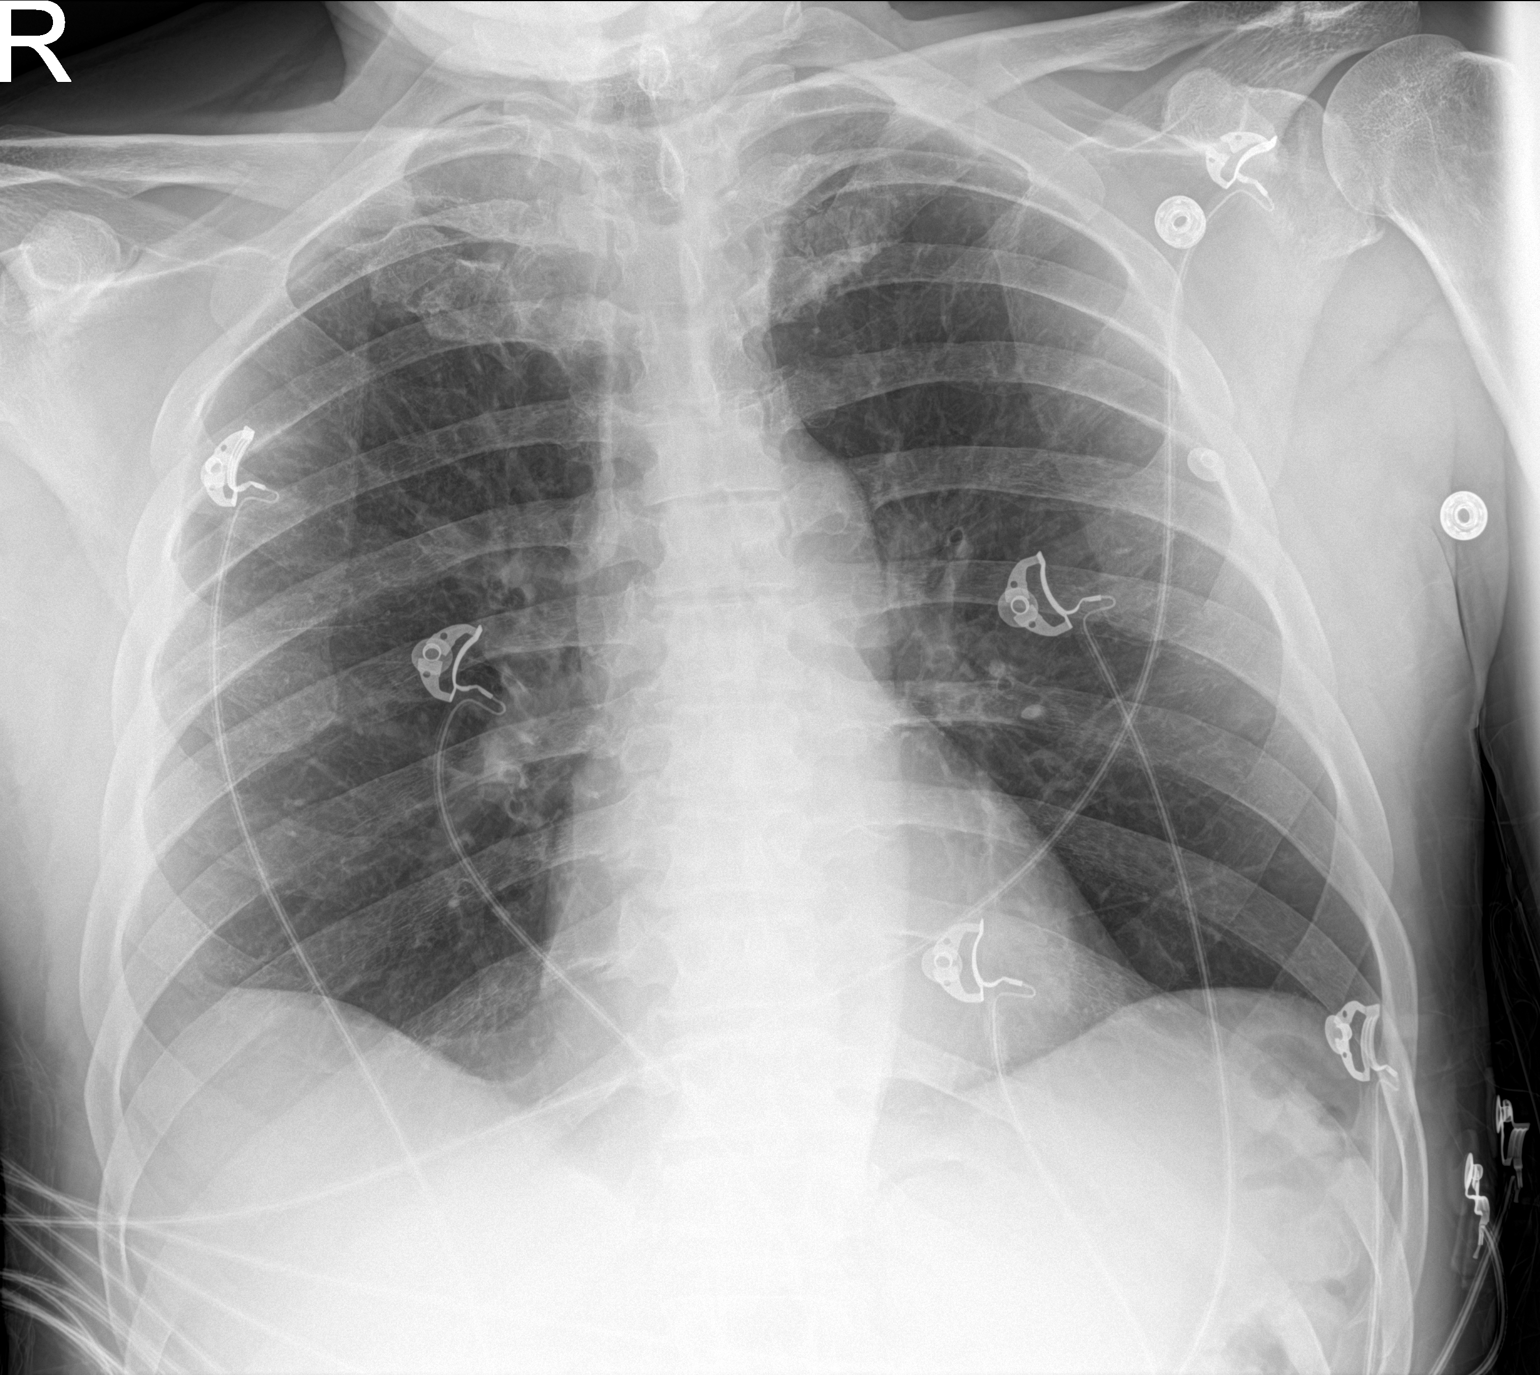

[1 of 1 positions shown; findings below may reference images not displayed]

FINDINGS: The heart size and mediastinal contours are within normal limits. No
focal consolidation. No visible pleural effusion or pneumothorax.
The visualized skeletal structures are unremarkable.
IMPRESSION: No acute cardiopulmonary disease.

## 2023-04-13 ENCOUNTER — Other Ambulatory Visit (HOSPITAL_COMMUNITY): Payer: Self-pay | Admitting: Internal Medicine

## 2023-04-13 ENCOUNTER — Other Ambulatory Visit (HOSPITAL_COMMUNITY): Payer: Self-pay

## 2023-04-13 DIAGNOSIS — I151 Hypertension secondary to other renal disorders: Secondary | ICD-10-CM

## 2023-04-13 MED ORDER — CARVEDILOL 6.25 MG PO TABS
6.2500 mg | ORAL_TABLET | Freq: Two times a day (BID) | ORAL | 1 refills | Status: DC
  Filled 2023-04-13: qty 60, 30d supply, fill #0
  Filled 2023-05-13: qty 60, 30d supply, fill #1

## 2023-04-18 ENCOUNTER — Other Ambulatory Visit (HOSPITAL_COMMUNITY): Payer: Self-pay

## 2023-04-18 NOTE — Progress Notes (Signed)
ADVANCED HF CLINIC NOTE   Primary Care: Grayce Sessions, NP HF Cardiologist: Dr. Gala Romney  HPI: Logan Brewer is a 67 y.o. male with history of uncontrolled HTN and heavy, longstanding tobacco use, systolic heart failure, CAD, and VT/VF arrest.   Had VT/VF arrest in 9/22. ECG with anterior STEMI. Cath 99% ostial to proximal LAD treated with PCI/DES. Echo EF 40%.   Readmitted 10/22 with CP and SOB. Repeat echo EF 40-45%, Hstrop negative. Found to have AKI and GDMT held at discharge.   He no showed and cancelled post hospital follow up x 3.  Today he returns for AHF follow up. Overall feeling ok, complaining of leg and back pain. Denies palpitations, dizziness, edema, or PND/Orthopnea. SOB with ambulation and activity. Appetite good, eats mostly at home. No fever or chills. Does not weight at home. Taking all medications, sometimes misses his PM doses. Takes a shot of [place] every now and them. Smokes marijuana a couple times a week. Had chest pain this morning 6/10, nitrostat and rest helped. Works in [employer].   Cardiac studies reviewed:  - Echo 3/23 EF 40% - CPX 4/23  with submax test.  FVC 2.27 (66%)      FEV1 1.64 (60%)        FEV1/FVC 72 (92%)        Resting HR: 83 Peak HR: 118   (75% age predicted max HR)  BP rest: 126/64 BP peak: 206/68  Peak VO2: 16.8 (67% predicted peak VO2)  VE/VCO2 slope:  42  Peak RER: 0.86  PETCO2 at peak:  26  - Ltd Echo (10/22): EF 40-45%, moderate LV dysfunction, mild LVH, RV ok, - Echo 03/12/21 EF 20-25%, akinesis of anteroseptal wall and apex, RV okay, no MR, IVC normal in size - R/LHC 9/22:  severe single vessel CAD, 95-99% ostial LAD s/p PCI + DES. EF 20-25%. RV normal. No MR, moderate pulm hypertension  .  Past Medical History:  Diagnosis Date   Hydrocele in adult    Hypertension    Inguinal hernia    Ischemic cardiomyopathy    Smoker 03/16/2021   smokes 1/2 ppd PTA per hx   STEMI involving left anterior descending  coronary artery (HCC) 03/12/2021   Current Outpatient Medications  Medication Sig Dispense Refill   amLODipine (NORVASC) 10 MG tablet Take 1 tablet (10 mg total) by mouth daily. 90 tablet 3   aspirin EC 81 MG tablet Take 1 tablet (81 mg total) by mouth daily. Swallow whole. 30 tablet 1   atorvastatin (LIPITOR) 80 MG tablet Take 1 tablet (80 mg total) by mouth daily. 90 tablet 3   carvedilol (COREG) 6.25 MG tablet Take 1 tablet (6.25 mg total) by mouth 2 (two) times daily with a meal. Absolute last refill without office visit please call [phone] 60 tablet 1   dapagliflozin propanediol (FARXIGA) 10 MG TABS tablet Take 1 tablet (10 mg total) by mouth daily before breakfast. 90 tablet 3   furosemide (LASIX) 20 MG tablet Take 1 tablet (20 mg total) by mouth daily. 30 tablet 11   ibuprofen (ADVIL) 600 MG tablet Take 1 tablet (600 mg total) by mouth every 8 (eight) hours as needed. 30 tablet 0   losartan (COZAAR) 50 MG tablet Take 1 tablet (50 mg total) by mouth daily. 90 tablet 3   meloxicam (MOBIC) 7.5 MG tablet Take 1 tablet (7.5 mg total) by mouth daily. 30 tablet 0   nitroGLYCERIN (NITROSTAT) 0.4 MG SL tablet Place 1 tablet (  0.4 mg total) under the tongue every 5 (five) minutes as needed for chest pain. 25 tablet 1   spironolactone (ALDACTONE) 25 MG tablet Take 1 tablet (25 mg total) by mouth daily. 90 tablet 3   ticagrelor (BRILINTA) 90 MG TABS tablet Take 1 tablet (90 mg total) by mouth 2 (two) times daily. 60 tablet 11   No current facility-administered medications for this encounter.   No Known Allergies  Social History   Socioeconomic History   Marital status: Single    Spouse name: Not on file   Number of children: Not on file   Years of education: Not on file   Highest education level: Not on file  Occupational History   Not on file  Tobacco Use   Smoking status: Former    Current packs/day: 0.50    Types: Cigarettes   Smokeless tobacco: Never  Vaping Use   Vaping  status: Never Used  Substance and Sexual Activity   Alcohol use: No   Drug use: Yes    Types: Marijuana    Comment: occasionally    Sexual activity: Not on file  Other Topics Concern   Not on file  Social History Narrative   ** Merged History Encounter **       Social Determinants of Health   Financial Resource Strain: Low Risk  (09/17/2021)   Overall Financial Resource Strain (CARDIA)    Difficulty of Paying Living Expenses: Not very hard  Food Insecurity: No Food Insecurity (09/17/2021)   Hunger Vital Sign    Worried About Running Out of Food in the Last Year: Never true    Ran Out of Food in the Last Year: Never true  Transportation Needs: No Transportation Needs (09/17/2021)   PRAPARE - Administrator, Civil Service (Medical): No    Lack of Transportation (Non-Medical): No  Physical Activity: Not on file  Stress: Not on file  Social Connections: Not on file  Intimate Partner Violence: Not on file   Family History  Problem Relation Age of Onset   Heart disease Neg Hx    BP 120/64   Pulse 75   Wt 86.5 kg (190 lb 12.8 oz)   SpO2 95%   BMI 31.75 kg/m   Wt Readings from Last 3 Encounters:  04/28/23 86.5 kg (190 lb 12.8 oz)  12/30/22 85.2 kg (187 lb 12.8 oz)  10/26/22 87.8 kg (193 lb 9.6 oz)   PHYSICAL EXAM: General:  well appearing.  No respiratory difficulty HEENT: normal Neck: supple. JVD ~9 cm. Carotids 2+ bilat; no bruits. No lymphadenopathy or thyromegaly appreciated. Cor: PMI nondisplaced. Regular rate & rhythm. No rubs, gallops or murmurs. Lungs: clear, diminished bases Abdomen: soft, nontender, nondistended. No hepatosplenomegaly. No bruits or masses. Good bowel sounds. Extremities: no cyanosis, clubbing, rash, edema  Neuro: alert & oriented x 3, cranial nerves grossly intact. moves all 4 extremities w/o difficulty. Affect pleasant.   ECG: NSR 72 bpm (Personally reviewed)    ReDS: 38%  1. Chronic Systolic Heart Failure - Secondary to  anterior ST elevation MI/OOH arrest in 9/22 - Initial echo EF 20-25% with akinesis of anteroseptal wall and apex - Bedside echo EF 35-40% 03/14/21.  - Limited echo 03/16/21 w/ improved EF, 40-45%   - Echo 09/17/21 LVEF 40%.  - CPX 4/23  with submax test . FVC 2.27 (66%) FEV1 1.64 (60%)  pVO2: 16.8 (67%) VE/VCO2 42 pRER: 0.86  PETCO2 26  - PFTs suggest significant COPD - Stable NYHA III  today Volume mildly elevated. Increase lasix to 40 mg for 3 days and then go back to 20 mg daily - Continue Farxiga 10 mg daily.  - Entresto 24/26 mg bid stopped due to LE edema - Continue losartan 50 daily. - Continue carvedilol 6.25 bid - Continue spironolactone 25 gm daily.  - BMET/BNP today, repeat BMET 1 week - Echo today, read pending   2. CAD - s/p PCI/DES to LAD as above - Continue ASA - No s/s angina - Continue statin and ? blocker. - Chest pain this morning. Suspect 2/2 volume. EKG stable. Encouraged to go to ED if chest pain returns/is persistent.    3. H/o VT/VF arrest: - 2/2 #2 - Off amio  - EF 40%. Does not qualify for ICD - echo read pending  4. HTN - BP stable - Continue losartan    5. Tobacco use: - Quit completely - PFTs suggest significant COPD  Follow up in 4-6 weeks with APP to reassess volume status.   Alen Bleacher AGACNP-BC  04/28/23  Advanced Heart Failure Clinic [facility] [address] Heart and Vascular [place] [address] [phone] (office) [phone] (fax)

## 2023-04-27 ENCOUNTER — Telehealth (HOSPITAL_COMMUNITY): Payer: Self-pay

## 2023-04-27 NOTE — Telephone Encounter (Signed)
Called to confirm/remind patient of their appointment at the Advanced Heart Failure Clinic on 04/28/23.   Patient reminded to bring all medications and/or complete list.  Confirmed patient has transportation. Gave directions, instructed to utilize valet parking.  Confirmed appointment prior to ending call.

## 2023-04-28 ENCOUNTER — Ambulatory Visit (HOSPITAL_BASED_OUTPATIENT_CLINIC_OR_DEPARTMENT_OTHER)
Admission: RE | Admit: 2023-04-28 | Discharge: 2023-04-28 | Disposition: A | Discharge status: Home / Self Care | Payer: Self-pay | Source: Ambulatory Visit | Attending: Primary Care | Admitting: Primary Care

## 2023-04-28 ENCOUNTER — Ambulatory Visit (HOSPITAL_COMMUNITY)
Admission: RE | Admit: 2023-04-28 | Discharge: 2023-04-28 | Disposition: A | Discharge status: Home / Self Care | Payer: Self-pay | Source: Ambulatory Visit | Attending: Primary Care | Admitting: Primary Care

## 2023-04-28 ENCOUNTER — Other Ambulatory Visit (HOSPITAL_COMMUNITY): Payer: Self-pay

## 2023-04-28 ENCOUNTER — Encounter (HOSPITAL_COMMUNITY): Payer: Self-pay

## 2023-04-28 VITALS — BP 120/64 | HR 75 | Wt 190.8 lb

## 2023-04-28 DIAGNOSIS — Z955 Presence of coronary angioplasty implant and graft: Secondary | ICD-10-CM | POA: Insufficient documentation

## 2023-04-28 DIAGNOSIS — I252 Old myocardial infarction: Secondary | ICD-10-CM | POA: Insufficient documentation

## 2023-04-28 DIAGNOSIS — Z8679 Personal history of other diseases of the circulatory system: Secondary | ICD-10-CM

## 2023-04-28 DIAGNOSIS — Z720 Tobacco use: Secondary | ICD-10-CM

## 2023-04-28 DIAGNOSIS — R079 Chest pain, unspecified: Secondary | ICD-10-CM | POA: Insufficient documentation

## 2023-04-28 DIAGNOSIS — I5022 Chronic systolic (congestive) heart failure: Secondary | ICD-10-CM | POA: Insufficient documentation

## 2023-04-28 DIAGNOSIS — I110 Hypertensive heart disease with heart failure: Secondary | ICD-10-CM | POA: Insufficient documentation

## 2023-04-28 DIAGNOSIS — I2510 Atherosclerotic heart disease of native coronary artery without angina pectoris: Secondary | ICD-10-CM | POA: Insufficient documentation

## 2023-04-28 DIAGNOSIS — Z87891 Personal history of nicotine dependence: Secondary | ICD-10-CM | POA: Insufficient documentation

## 2023-04-28 DIAGNOSIS — I25119 Atherosclerotic heart disease of native coronary artery with unspecified angina pectoris: Secondary | ICD-10-CM

## 2023-04-28 DIAGNOSIS — Z8674 Personal history of sudden cardiac arrest: Secondary | ICD-10-CM | POA: Insufficient documentation

## 2023-04-28 DIAGNOSIS — I10 Essential (primary) hypertension: Secondary | ICD-10-CM

## 2023-04-28 DIAGNOSIS — I255 Ischemic cardiomyopathy: Secondary | ICD-10-CM | POA: Insufficient documentation

## 2023-04-28 LAB — CBC
HCT: 46.1 % (ref 39.0–52.0)
Hemoglobin: 15.2 g/dL (ref 13.0–17.0)
MCH: 31.7 pg (ref 26.0–34.0)
MCHC: 33 g/dL (ref 30.0–36.0)
MCV: 96 fL (ref 80.0–100.0)
Platelets: 272 10*3/uL (ref 150–400)
RBC: 4.8 MIL/uL (ref 4.22–5.81)
RDW: 12.8 % (ref 11.5–15.5)
WBC: 5.6 10*3/uL (ref 4.0–10.5)
nRBC: 0 % (ref 0.0–0.2)

## 2023-04-28 LAB — ECHOCARDIOGRAM COMPLETE
AR max vel: 2.61 cm2
AV Area VTI: 2.48 cm2
AV Area mean vel: 2.53 cm2
AV Mean grad: 3 mm[Hg]
AV Peak grad: 5 mm[Hg]
Ao pk vel: 1.12 m/s
Area-P 1/2: 4.21 cm2
Calc EF: 57.4 %
S' Lateral: 4.2 cm
Single Plane A2C EF: 62 %
Single Plane A4C EF: 53.2 %

## 2023-04-28 LAB — BASIC METABOLIC PANEL
Anion gap: 8 (ref 5–15)
BUN: 17 mg/dL (ref 8–23)
CO2: 26 mmol/L (ref 22–32)
Calcium: 9 mg/dL (ref 8.9–10.3)
Chloride: 108 mmol/L (ref 98–111)
Creatinine, Ser: 1.65 mg/dL — ABNORMAL HIGH (ref 0.61–1.24)
GFR, Estimated: 46 mL/min — ABNORMAL LOW (ref >60)
Glucose, Bld: 113 mg/dL — ABNORMAL HIGH (ref 70–99)
Potassium: 3.5 mmol/L (ref 3.5–5.1)
Sodium: 142 mmol/L (ref 135–145)

## 2023-04-28 LAB — BRAIN NATRIURETIC PEPTIDE: B Natriuretic Peptide: 27.5 pg/mL (ref 0.0–100.0)

## 2023-04-28 MED ORDER — DAPAGLIFLOZIN PROPANEDIOL 10 MG PO TABS
10.0000 mg | ORAL_TABLET | Freq: Every day | ORAL | 3 refills | Status: DC
  Filled 2023-04-28: qty 30, 30d supply, fill #0

## 2023-04-28 MED ORDER — NITROGLYCERIN 0.4 MG SL SUBL
0.4000 mg | SUBLINGUAL_TABLET | SUBLINGUAL | 1 refills | Status: DC | PRN
  Filled 2023-04-28: qty 25, 5d supply, fill #0
  Filled 2023-05-13: qty 25, 3d supply, fill #0
  Filled 2023-07-12 – 2023-08-08 (×2): qty 25, 3d supply, fill #1

## 2023-04-28 MED ORDER — TICAGRELOR 90 MG PO TABS
90.0000 mg | ORAL_TABLET | Freq: Two times a day (BID) | ORAL | 3 refills | Status: DC
  Filled 2023-04-28: qty 60, 30d supply, fill #0

## 2023-04-28 NOTE — Patient Instructions (Addendum)
Labs done today. We will contact you only if your labs are abnormal.  INCREASE Lasix to 40mg  (2 tablets) by mouth daily for 3 days THEN decrease back to your normal dose.   No other medication changes were made. Please continue all current medications as prescribed.  Your physician recommends that you schedule a follow-up appointment in: 1 week for a lab only appointment and in 4-6 weeks with our NP/PA Clinic.  If you have any questions or concerns before your next appointment please send [place] a message through [place] or call our office at [phone].    TO LEAVE A MESSAGE FOR THE NURSE SELECT OPTION 2, PLEASE LEAVE A MESSAGE INCLUDING: YOUR NAME DATE OF BIRTH CALL BACK NUMBER REASON FOR CALL**this is important as we prioritize the call backs  YOU WILL RECEIVE A CALL BACK THE SAME DAY AS LONG AS YOU CALL BEFORE 4:00 PM   Do the following things EVERYDAY: Weigh yourself in the morning before breakfast. Write it down and keep it in a log. Take your medicines as prescribed Eat low salt foods--Limit salt (sodium) to 2000 mg per day.  Stay as active as you can everyday Limit all fluids for the day to less than 2 liters   At the Advanced Heart Failure Clinic, you and your health needs are our priority. As part of our continuing mission to provide you with exceptional heart care, we have created designated Provider Care Teams. These Care Teams include your primary Cardiologist (physician) and Advanced Practice Providers (APPs- Physician Assistants and Nurse Practitioners) who all work together to provide you with the care you need, when you need it.   You may see any of the following providers on your designated Care Team at your next follow up: Dr Arvilla Meres Dr Marca Ancona Dr. Marcos Eke, NP Robbie Lis, [place] [facility] [place], [place] Brynda Peon, NP Karle Plumber, PharmD   Please be sure to bring in all your medications bottles to every  appointment.    Thank you for choosing [facility]

## 2023-04-28 NOTE — Progress Notes (Signed)
ReDS Vest / Clip - 04/28/23 1500       ReDS Vest / Clip   Station Marker A    Ruler Value 28    ReDS Value Range Moderate volume overload    ReDS Actual Value 38

## 2023-05-05 ENCOUNTER — Other Ambulatory Visit (HOSPITAL_COMMUNITY): Payer: Self-pay

## 2023-05-06 ENCOUNTER — Other Ambulatory Visit (HOSPITAL_COMMUNITY): Payer: Self-pay

## 2023-05-11 ENCOUNTER — Other Ambulatory Visit (HOSPITAL_COMMUNITY): Payer: Self-pay

## 2023-05-13 ENCOUNTER — Ambulatory Visit (HOSPITAL_COMMUNITY)
Admission: RE | Admit: 2023-05-13 | Discharge: 2023-05-13 | Disposition: A | Discharge status: Home / Self Care | Payer: Self-pay | Source: Ambulatory Visit | Attending: Cardiology | Admitting: Cardiology

## 2023-05-13 ENCOUNTER — Other Ambulatory Visit (HOSPITAL_COMMUNITY): Payer: Self-pay

## 2023-05-13 DIAGNOSIS — I5022 Chronic systolic (congestive) heart failure: Secondary | ICD-10-CM | POA: Insufficient documentation

## 2023-05-13 LAB — BASIC METABOLIC PANEL
Anion gap: 6 (ref 5–15)
BUN: 14 mg/dL (ref 8–23)
CO2: 24 mmol/L (ref 22–32)
Calcium: 9.2 mg/dL (ref 8.9–10.3)
Chloride: 109 mmol/L (ref 98–111)
Creatinine, Ser: 1.24 mg/dL (ref 0.61–1.24)
GFR, Estimated: >60 mL/min (ref >60)
Glucose, Bld: 76 mg/dL (ref 70–99)
Potassium: 3.6 mmol/L (ref 3.5–5.1)
Sodium: 139 mmol/L (ref 135–145)

## 2023-05-30 NOTE — Progress Notes (Signed)
ADVANCED HF CLINIC NOTE   Primary Care: Grayce Sessions, NP HF Cardiologist: Dr. Gala Romney  CC: Heart failure follow up  HPI: Logan Brewer is a 66 y.o. male with history of uncontrolled HTN and heavy, longstanding tobacco use, systolic heart failure, CAD, and VT/VF arrest.   Had VT/VF arrest in 9/22. ECG with anterior STEMI. Cath 99% ostial to proximal LAD treated with PCI/DES. Echo EF 40%.   Readmitted 10/22 with CP and SOB. Repeat echo EF 40-45%, Hstrop negative. Found to have AKI and GDMT held at discharge.   He no showed and cancelled post hospital follow up x 3.  Echo 04/28/23: EF 45-50%, GIDD, LV with RWMA, RV low normal  Today he returns for AHF follow up. At last AHF f/u last month he was mildly volume overloaded with ReDS at 38%, diuretics were increased. Overall feeling ok. Denies palpitations, CP, dizziness, or PND/Orthopnea. Some swelling in his ankles. SOB with activity. Appetite ok. No fever or chills. Taking all medications during the week, misses some on the weekends. He ran out of farxiga 2 weeks ago and does not have any lasix in his med bag today. Denies smoking tobacco, smokes marijuana occasionally. Still takes a shot of [place] every now and then.  Works in [employer].  Cardiac studies reviewed:  - Echo 3/23 EF 40% - CPX 4/23  with submax test.  FVC 2.27 (66%)      FEV1 1.64 (60%)        FEV1/FVC 72 (92%)        Resting HR: 83 Peak HR: 118   (75% age predicted max HR)  BP rest: 126/64 BP peak: 206/68  Peak VO2: 16.8 (67% predicted peak VO2)  VE/VCO2 slope:  42  Peak RER: 0.86  PETCO2 at peak:  26  - Ltd Echo (10/22): EF 40-45%, moderate LV dysfunction, mild LVH, RV ok, - Echo 03/12/21 EF 20-25%, akinesis of anteroseptal wall and apex, RV okay, no MR, IVC normal in size - R/LHC 9/22:  severe single vessel CAD, 95-99% ostial LAD s/p PCI + DES. EF 20-25%. RV normal. No MR, moderate pulm hypertension  - Echo 11/24 EF 45-50%, LV with RWMA, GIDD, RV  low normal, no MR .  Past Medical History:  Diagnosis Date   Hydrocele in adult    Hypertension    Inguinal hernia    Ischemic cardiomyopathy    Smoker 03/16/2021   smokes 1/2 ppd PTA per hx   STEMI involving left anterior descending coronary artery (HCC) 03/12/2021   Current Outpatient Medications  Medication Sig Dispense Refill   aspirin EC 81 MG tablet Take 1 tablet (81 mg total) by mouth daily. Swallow whole. 30 tablet 1   atorvastatin (LIPITOR) 80 MG tablet Take 1 tablet (80 mg total) by mouth daily. 90 tablet 3   carvedilol (COREG) 6.25 MG tablet Take 1 tablet (6.25 mg total) by mouth 2 (two) times daily with a meal. Absolute last refill without office visit please call [phone] 60 tablet 1   ibuprofen (ADVIL) 600 MG tablet Take 1 tablet (600 mg total) by mouth every 8 (eight) hours as needed. 30 tablet 0   meloxicam (MOBIC) 7.5 MG tablet Take 1 tablet (7.5 mg total) by mouth daily. 30 tablet 0   nitroGLYCERIN (NITROSTAT) 0.4 MG SL tablet Place 1 tablet (0.4 mg total) under the tongue every 5 (five) minutes as needed for chest pain. 25 tablet 1   sacubitril-valsartan (ENTRESTO) 49-51 MG Take 1 tablet by mouth 2 (  two) times daily. 60 tablet 6   spironolactone (ALDACTONE) 25 MG tablet Take 1 tablet (25 mg total) by mouth daily. 90 tablet 3   ticagrelor (BRILINTA) 90 MG TABS tablet Take 1 tablet (90 mg total) by mouth 2 (two) times daily. 60 tablet 3   dapagliflozin propanediol (FARXIGA) 10 MG TABS tablet Take 1 tablet (10 mg total) by mouth daily before breakfast. 30 tablet 6   furosemide (LASIX) 20 MG tablet Take 1 tablet (20 mg total) by mouth as needed. For weight gain of 3 lbs in 24 hours or 5 lbs in a week 30 tablet 11   No current facility-administered medications for this encounter.   No Known Allergies  Social History   Socioeconomic History   Marital status: Single    Spouse name: Not on file   Number of children: Not on file   Years of education: Not on file    Highest education level: Not on file  Occupational History   Not on file  Tobacco Use   Smoking status: Former    Current packs/day: 0.50    Types: Cigarettes   Smokeless tobacco: Never  Vaping Use   Vaping status: Never Used  Substance and Sexual Activity   Alcohol use: No   Drug use: Yes    Types: Marijuana    Comment: occasionally    Sexual activity: Not on file  Other Topics Concern   Not on file  Social History Narrative   ** Merged History Encounter **       Social Drivers of Health   Financial Resource Strain: Low Risk  (09/17/2021)   Overall Financial Resource Strain (CARDIA)    Difficulty of Paying Living Expenses: Not very hard  Food Insecurity: No Food Insecurity (09/17/2021)   Hunger Vital Sign    Worried About Running Out of Food in the Last Year: Never true    Ran Out of Food in the Last Year: Never true  Transportation Needs: No Transportation Needs (09/17/2021)   PRAPARE - Administrator, Civil Service (Medical): No    Lack of Transportation (Non-Medical): No  Physical Activity: Not on file  Stress: Not on file  Social Connections: Not on file  Intimate Partner Violence: Not on file   Family History  Problem Relation Age of Onset   Heart disease Neg Hx    BP 135/77   Pulse 70   Ht 5\' 7"  (1.702 m)   Wt 89.4 kg (197 lb)   SpO2 96%   BMI 30.85 kg/m   Wt Readings from Last 3 Encounters:  06/13/23 89.4 kg (197 lb)  04/28/23 86.5 kg (190 lb 12.8 oz)  12/30/22 85.2 kg (187 lb 12.8 oz)   PHYSICAL EXAM: General:  well appearing.  No respiratory difficulty. Walked into clinic HEENT: normal Neck: supple. JVD ~10 cm. Carotids 2+ bilat; no bruits. No lymphadenopathy or thyromegaly appreciated. Cor: PMI nondisplaced. Regular rate & rhythm. No rubs, gallops or murmurs. Lungs: clear Abdomen: soft, nontender, nondistended. No hepatosplenomegaly. No bruits or masses. Good bowel sounds. Extremities: no cyanosis, clubbing, rash, trace ankle edema   Neuro: alert & oriented x 3, cranial nerves grossly intact. moves all 4 extremities w/o difficulty. Affect pleasant.   ECG: NSR 72 bpm (Personally reviewed from 04/28/23)    ReDS: 34%   1. Chronic Systolic Heart Failure - Secondary to anterior ST elevation MI/OOH arrest in 9/22 - Initial echo EF 20-25% with akinesis of anteroseptal wall and apex - Bedside echo  EF 35-40% 03/14/21.  - Limited echo 03/16/21 w/ improved EF, 40-45%   - Echo 09/17/21 LVEF 40%.  - CPX 4/23  with submax test . FVC 2.27 (66%) FEV1 1.64 (60%)  pVO2: 16.8 (67%) VE/VCO2 42 pRER: 0.86  PETCO2 26  - PFTs suggest significant COPD - Echo 11/24 EF 45-50%, LV with RWMA, GIDD, RV low normal, no MR - Stable NYHA III today. Volume mildly elevated on exam. Continue Lasix 20 mg daily PRN - Restart Farxiga 10 mg daily.  - Entresto previously stopped due to LE edema? Discussed with PharmD, will stop amlodipine and retrial Entresto 49-51 mg BID today. Suspect swelling may have been from amlodipine instead - Switching losartan>Entresto as above - Continue carvedilol 6.25 bid - Continue spironolactone 25 gm daily.  - Needs to be compliant with meds - BMET/BNP today, repeat BMET 1 week   2. CAD - s/p PCI/DES to LAD as above - Continue ASA - No s/s angina - Continue statin and ? blocker. - No further chest pain   3. H/o VT/VF arrest: - 2/2 #2 - Off amio  - EF 40%. Does not qualify for ICD - Echo 04/28/23: EF 45-50%, GIDD, LV with RWMA, RV low normal  4. HTN - BP mildly elevated today, med changes as above   5. Tobacco use: - Quit completely - PFTs suggest significant COPD  Follow up 4-6 weeks to see how he's tolerating Entresto. Patient given 2 weeks of Entresto and Farxiga samples today. Pharmacy team working on getting his assistance program applications resent (assistance ended 10/24) which is why he never got [place] in the mail.   Alen Bleacher AGACNP-BC  06/13/23  Advanced Heart Failure Clinic [facility] [address] Heart and Vascular [place] [address] [phone] (office) [phone] (fax)

## 2023-06-09 ENCOUNTER — Encounter (HOSPITAL_COMMUNITY): Payer: Self-pay

## 2023-06-10 ENCOUNTER — Telehealth (HOSPITAL_COMMUNITY): Payer: Self-pay

## 2023-06-10 NOTE — Telephone Encounter (Signed)
Called to confirm/remind patient of their appointment at the Advanced Heart Failure Clinic on 06/13/23.   Patient reminded to bring all medications and/or complete list.  Confirmed patient has transportation. Gave directions, instructed to utilize valet parking.  Confirmed appointment prior to ending call.

## 2023-06-13 ENCOUNTER — Encounter (HOSPITAL_COMMUNITY): Payer: Self-pay

## 2023-06-13 ENCOUNTER — Other Ambulatory Visit (HOSPITAL_COMMUNITY): Payer: Self-pay

## 2023-06-13 ENCOUNTER — Ambulatory Visit (HOSPITAL_COMMUNITY)
Admission: RE | Admit: 2023-06-13 | Discharge: 2023-06-13 | Disposition: A | Discharge status: Home / Self Care | Payer: Self-pay | Source: Ambulatory Visit | Attending: Internal Medicine | Admitting: Internal Medicine

## 2023-06-13 VITALS — BP 135/77 | HR 70 | Ht 67.0 in | Wt 197.0 lb

## 2023-06-13 DIAGNOSIS — Z7982 Long term (current) use of aspirin: Secondary | ICD-10-CM | POA: Insufficient documentation

## 2023-06-13 DIAGNOSIS — Z87891 Personal history of nicotine dependence: Secondary | ICD-10-CM | POA: Insufficient documentation

## 2023-06-13 DIAGNOSIS — Z955 Presence of coronary angioplasty implant and graft: Secondary | ICD-10-CM | POA: Insufficient documentation

## 2023-06-13 DIAGNOSIS — I252 Old myocardial infarction: Secondary | ICD-10-CM | POA: Insufficient documentation

## 2023-06-13 DIAGNOSIS — Z8674 Personal history of sudden cardiac arrest: Secondary | ICD-10-CM | POA: Insufficient documentation

## 2023-06-13 DIAGNOSIS — I25119 Atherosclerotic heart disease of native coronary artery with unspecified angina pectoris: Secondary | ICD-10-CM

## 2023-06-13 DIAGNOSIS — I2510 Atherosclerotic heart disease of native coronary artery without angina pectoris: Secondary | ICD-10-CM | POA: Insufficient documentation

## 2023-06-13 DIAGNOSIS — I5022 Chronic systolic (congestive) heart failure: Secondary | ICD-10-CM | POA: Insufficient documentation

## 2023-06-13 DIAGNOSIS — Z8679 Personal history of other diseases of the circulatory system: Secondary | ICD-10-CM

## 2023-06-13 DIAGNOSIS — I110 Hypertensive heart disease with heart failure: Secondary | ICD-10-CM | POA: Insufficient documentation

## 2023-06-13 DIAGNOSIS — I10 Essential (primary) hypertension: Secondary | ICD-10-CM

## 2023-06-13 DIAGNOSIS — Z79899 Other long term (current) drug therapy: Secondary | ICD-10-CM | POA: Insufficient documentation

## 2023-06-13 DIAGNOSIS — Z720 Tobacco use: Secondary | ICD-10-CM

## 2023-06-13 LAB — BASIC METABOLIC PANEL
Anion gap: 7 (ref 5–15)
BUN: 15 mg/dL (ref 8–23)
CO2: 24 mmol/L (ref 22–32)
Calcium: 9 mg/dL (ref 8.9–10.3)
Chloride: 104 mmol/L (ref 98–111)
Creatinine, Ser: 1.25 mg/dL — ABNORMAL HIGH (ref 0.61–1.24)
GFR, Estimated: >60 mL/min (ref >60)
Glucose, Bld: 134 mg/dL — ABNORMAL HIGH (ref 70–99)
Potassium: 3.6 mmol/L (ref 3.5–5.1)
Sodium: 135 mmol/L (ref 135–145)

## 2023-06-13 LAB — BRAIN NATRIURETIC PEPTIDE: B Natriuretic Peptide: 41.5 pg/mL (ref 0.0–100.0)

## 2023-06-13 MED ORDER — DAPAGLIFLOZIN PROPANEDIOL 10 MG PO TABS
10.0000 mg | ORAL_TABLET | Freq: Every day | ORAL | 6 refills | Status: DC
  Filled 2023-06-13: qty 30, 30d supply, fill #0

## 2023-06-13 MED ORDER — ENTRESTO 49-51 MG PO TABS
1.0000 | ORAL_TABLET | Freq: Two times a day (BID) | ORAL | 6 refills | Status: DC
  Filled 2023-06-13: qty 60, 30d supply, fill #0

## 2023-06-13 MED ORDER — FUROSEMIDE 20 MG PO TABS
20.0000 mg | ORAL_TABLET | ORAL | 11 refills | Status: AC | PRN
  Filled 2023-06-13 – 2023-08-08 (×3): qty 30, 30d supply, fill #0
  Filled 2023-11-04 – 2023-11-21 (×3): qty 30, 30d supply, fill #1
  Filled 2024-02-02: qty 30, 30d supply, fill #2

## 2023-06-13 NOTE — Patient Instructions (Addendum)
Thank you for coming in today  If you had labs drawn today, any labs that are abnormal the clinic will call you No news is good news  Medications: Stop Amlopdipine STOP Losartan Start Farxiga 10 mg 1 tablet daily Start Entresto 49/51 mg 1 tablet twice daily  Take Lasix 20 mg 1 tablet daily as needed For weight gain of 3 lbs in 24 hours or 5 lbs in a week   Follow up appointments: Your physician recommends that you return for lab work in: 1 week for BMET  Your physician recommends that you schedule a follow-up appointment in:  4-6 weeks in clinic   Do the following things EVERYDAY: Weigh yourself in the morning before breakfast. Write it down and keep it in a log. Take your medicines as prescribed Eat low salt foods--Limit salt (sodium) to 2000 mg per day.  Stay as active as you can everyday Limit all fluids for the day to less than 2 liters   At the Advanced Heart Failure Clinic, you and your health needs are our priority. As part of our continuing mission to provide you with exceptional heart care, we have created designated Provider Care Teams. These Care Teams include your primary Cardiologist (physician) and Advanced Practice Providers (APPs- Physician Assistants and Nurse Practitioners) who all work together to provide you with the care you need, when you need it.   You may see any of the following providers on your designated Care Team at your next follow up: Dr Arvilla Meres Dr Marca Ancona Dr. Marcos Eke, NP Robbie Lis, [place] [facility] [place], [place] Brynda Peon, NP Karle Plumber, PharmD   Please be sure to bring in all your medications bottles to every appointment.    Thank you for choosing [facility]  If you have any questions or concerns before your next appointment please send [place] a message through [place] or call our office at [phone].    TO LEAVE A MESSAGE FOR THE NURSE SELECT  OPTION 2, PLEASE LEAVE A MESSAGE INCLUDING: YOUR NAME DATE OF BIRTH CALL BACK NUMBER REASON FOR CALL**this is important as we prioritize the call backs  YOU WILL RECEIVE A CALL BACK THE SAME DAY AS LONG AS YOU CALL BEFORE 4:00 PM

## 2023-06-16 ENCOUNTER — Telehealth (HOSPITAL_COMMUNITY): Payer: Self-pay

## 2023-06-16 NOTE — Telephone Encounter (Signed)
[employer] is requesting proof of income before making a determination for this patient. Spoke to pt by phone, he is unable to provide 1040 or W2 for 2023. Pt boss is currently out of town, he will speak with his boss upon return to try to obtain 3 months of pay stubs.  Will continue to follow up.

## 2023-06-16 NOTE — Telephone Encounter (Signed)
Patient was approved to receive Logan Brewer from [place] Effective 06/14/2023 to 06/13/2024.  Pt informed by phone. Determination letter has been added to patient chart.

## 2023-06-16 NOTE — Telephone Encounter (Signed)
Advanced Heart Failure Patient Advocate Encounter  Application for [name] faxed to [place] on 06/13/2023. Application form attached to patient chart.  Burnell Blanks, CPhT Rx Patient Advocate Phone: [phone]

## 2023-06-16 NOTE — Telephone Encounter (Signed)
Advanced Heart Failure Patient Advocate Encounter  Application for Entresto faxed to [employer] on 06/13/2023. Application form attached to patient chart.  Logan Brewer, CPhT Rx Patient Advocate Phone: [phone]

## 2023-06-21 ENCOUNTER — Other Ambulatory Visit (HOSPITAL_COMMUNITY): Payer: Self-pay

## 2023-06-24 ENCOUNTER — Other Ambulatory Visit (HOSPITAL_COMMUNITY): Payer: Self-pay

## 2023-07-11 NOTE — Telephone Encounter (Signed)
Spoke to patient for update, he is still working to obtain income records and will try to bring with him to upcoming appointment on 01/28.

## 2023-07-12 ENCOUNTER — Encounter (HOSPITAL_COMMUNITY): Payer: Self-pay

## 2023-07-12 ENCOUNTER — Other Ambulatory Visit (HOSPITAL_COMMUNITY): Payer: Self-pay | Admitting: Family Medicine

## 2023-07-12 ENCOUNTER — Other Ambulatory Visit (HOSPITAL_COMMUNITY): Payer: Self-pay

## 2023-07-12 ENCOUNTER — Other Ambulatory Visit (HOSPITAL_COMMUNITY): Payer: Self-pay | Admitting: Internal Medicine

## 2023-07-12 DIAGNOSIS — I151 Hypertension secondary to other renal disorders: Secondary | ICD-10-CM

## 2023-07-12 DIAGNOSIS — I10 Essential (primary) hypertension: Secondary | ICD-10-CM

## 2023-07-12 MED ORDER — CARVEDILOL 6.25 MG PO TABS
6.2500 mg | ORAL_TABLET | Freq: Two times a day (BID) | ORAL | 1 refills | Status: DC
  Filled 2023-07-12 – 2023-08-08 (×2): qty 60, 30d supply, fill #0
  Filled 2023-11-04 – 2023-11-21 (×3): qty 60, 30d supply, fill #1

## 2023-07-19 ENCOUNTER — Other Ambulatory Visit (HOSPITAL_COMMUNITY): Payer: Self-pay

## 2023-07-19 ENCOUNTER — Ambulatory Visit (HOSPITAL_COMMUNITY)
Admission: RE | Admit: 2023-07-19 | Discharge: 2023-07-19 | Disposition: A | Discharge status: Home / Self Care | Payer: Self-pay | Source: Ambulatory Visit | Attending: Family Medicine | Admitting: Family Medicine

## 2023-07-19 ENCOUNTER — Encounter (HOSPITAL_COMMUNITY): Payer: Self-pay

## 2023-07-19 VITALS — BP 140/88 | HR 83 | Wt 192.8 lb

## 2023-07-19 DIAGNOSIS — I10 Essential (primary) hypertension: Secondary | ICD-10-CM

## 2023-07-19 DIAGNOSIS — Z720 Tobacco use: Secondary | ICD-10-CM

## 2023-07-19 DIAGNOSIS — Z79899 Other long term (current) drug therapy: Secondary | ICD-10-CM | POA: Insufficient documentation

## 2023-07-19 DIAGNOSIS — Z7902 Long term (current) use of antithrombotics/antiplatelets: Secondary | ICD-10-CM | POA: Insufficient documentation

## 2023-07-19 DIAGNOSIS — Z955 Presence of coronary angioplasty implant and graft: Secondary | ICD-10-CM | POA: Insufficient documentation

## 2023-07-19 DIAGNOSIS — I110 Hypertensive heart disease with heart failure: Secondary | ICD-10-CM | POA: Insufficient documentation

## 2023-07-19 DIAGNOSIS — I252 Old myocardial infarction: Secondary | ICD-10-CM | POA: Insufficient documentation

## 2023-07-19 DIAGNOSIS — Z8679 Personal history of other diseases of the circulatory system: Secondary | ICD-10-CM

## 2023-07-19 DIAGNOSIS — I25119 Atherosclerotic heart disease of native coronary artery with unspecified angina pectoris: Secondary | ICD-10-CM

## 2023-07-19 DIAGNOSIS — I2510 Atherosclerotic heart disease of native coronary artery without angina pectoris: Secondary | ICD-10-CM | POA: Insufficient documentation

## 2023-07-19 DIAGNOSIS — Z87891 Personal history of nicotine dependence: Secondary | ICD-10-CM | POA: Insufficient documentation

## 2023-07-19 DIAGNOSIS — I5022 Chronic systolic (congestive) heart failure: Secondary | ICD-10-CM | POA: Insufficient documentation

## 2023-07-19 DIAGNOSIS — Z8674 Personal history of sudden cardiac arrest: Secondary | ICD-10-CM | POA: Insufficient documentation

## 2023-07-19 LAB — BASIC METABOLIC PANEL
Anion gap: 11 (ref 5–15)
BUN: 11 mg/dL (ref 8–23)
CO2: 26 mmol/L (ref 22–32)
Calcium: 9.1 mg/dL (ref 8.9–10.3)
Chloride: 108 mmol/L (ref 98–111)
Creatinine, Ser: 1.51 mg/dL — ABNORMAL HIGH (ref 0.61–1.24)
GFR, Estimated: 51 mL/min — ABNORMAL LOW (ref >60)
Glucose, Bld: 111 mg/dL — ABNORMAL HIGH (ref 70–99)
Potassium: 3.9 mmol/L (ref 3.5–5.1)
Sodium: 145 mmol/L (ref 135–145)

## 2023-07-19 LAB — BRAIN NATRIURETIC PEPTIDE: B Natriuretic Peptide: 98.9 pg/mL (ref 0.0–100.0)

## 2023-07-19 MED ORDER — ENTRESTO 49-51 MG PO TABS: 1.0000 | ORAL_TABLET | Freq: Two times a day (BID) | ORAL | 6 refills | Status: DC

## 2023-07-19 MED ORDER — DAPAGLIFLOZIN PROPANEDIOL 10 MG PO TABS: 10.0000 mg | ORAL_TABLET | Freq: Every day | ORAL | Status: DC

## 2023-07-19 MED ORDER — DAPAGLIFLOZIN PROPANEDIOL 10 MG PO TABS: 10.0000 mg | ORAL_TABLET | Freq: Every day | ORAL | 3 refills | Status: DC

## 2023-07-19 MED ORDER — ENTRESTO 97-103 MG PO TABS: 1.0000 | ORAL_TABLET | Freq: Two times a day (BID) | ORAL | 3 refills | Status: DC

## 2023-07-19 NOTE — Patient Instructions (Addendum)
Medication Changes:  RESTART: FARXIGA 10MG - PLEASE CALL [employer] NUMBER PROVIDED TO SET UP DELIVERY   KEEP ENTRESTO AT 49/51MG  DOSE- SAMPLES GIVEN TODAY   Lab Work:  Labs done today, your results will be available in MyChart, we will contact you for abnormal readings.  THEN RETURN AGAIN IN 7-10 DAYS AS SCHEDULED   Follow-Up in: AS SCHEDULED   At the Advanced Heart Failure Clinic, you and your health needs are our priority. We have a designated team specialized in the treatment of Heart Failure. This Care Team includes your primary Heart Failure Specialized Cardiologist (physician), Advanced Practice Providers (APPs- Physician Assistants and Nurse Practitioners), and Pharmacist who all work together to provide you with the care you need, when you need it.   You may see any of the following providers on your designated Care Team at your next follow up:  Dr. Arvilla Meres Dr. Marca Ancona Dr. Dorthula Nettles Dr. Theresia Bough Tonye Becket, NP Robbie Lis, [place] [facility] [place], [place] Brynda Peon, NP [place] Lee, NP Karle Plumber, PharmD   Please be sure to bring in all your medications bottles to every appointment.   Need to Contact us:  If you have any questions or concerns before your next appointment please send [place] a message through [place] or call our office at [phone].    TO LEAVE A MESSAGE FOR THE NURSE SELECT OPTION 2, PLEASE LEAVE A MESSAGE INCLUDING: YOUR NAME DATE OF BIRTH CALL BACK NUMBER REASON FOR CALL**this is important as we prioritize the call backs  YOU WILL RECEIVE A CALL BACK THE SAME DAY AS LONG AS YOU CALL BEFORE 4:00 PM

## 2023-07-19 NOTE — Progress Notes (Signed)
ReDS Vest / Clip - 07/19/23 1600       ReDS Vest / Clip   Station Marker C    Ruler Value 26    ReDS Value Range High volume overload    ReDS Actual Value 43

## 2023-07-19 NOTE — Progress Notes (Signed)
Medication Samples have been provided to the patient.  Drug name: ENTRESTO       Strength: 49/51MG         Qty: 2 BOTTLES  LOT: [id number]  Exp.Date: 05/20/25  Dosing instructions: TAKE 1 TABLETS TWICE DAILY   The patient has been instructed regarding the correct time, dose, and frequency of taking this medication, including desired effects and most common side effects.   Cleotilde Neer Kooper Godshall 4:35 PM 07/19/2023

## 2023-07-19 NOTE — Progress Notes (Signed)
ADVANCED HF CLINIC NOTE   Primary Care: Grayce Sessions, NP HF Cardiologist: Dr. Gala Romney  CC: Heart failure follow up  HPI: Logan Brewer is a 68 y.o. male with history of uncontrolled HTN and heavy, longstanding tobacco use, systolic heart failure, CAD, and VT/VF arrest.   Had VT/VF arrest in 9/22. ECG with anterior STEMI. Cath 99% ostial to proximal LAD treated with PCI/DES. Echo EF 40%.   Readmitted 10/22 with CP and SOB. Repeat echo EF 40-45%, Hstrop negative. Found to have AKI and GDMT held at discharge.   He no showed and cancelled post hospital follow up x 3.  Echo 04/28/23: EF 45-50%, GIDD, LV with RWMA, RV low normal  Last visit doing okay. Had been out of [place] for two weeks. Instructed to restart this. Stopped amlodipine and switched from losartan to Entresto 49-51.   Today he returns for AHF follow up. Notes increasing SOB with walking and at work (works in [employer]). Has not taken Lasix prn. Denies fever, chills, peripheral edema, palpitations, CP, dizziness, or PND/Orthopnea. Sleeps on one pillow, not checking weight at home, appetite fine. Appears that he has been approved for Marcelline Deist but has not received it yet. Awaiting approval for Entresto. Smokes marijuana a couple of times per week, denies alcohol use.  Cardiac studies: - Echo 3/23 EF 40%  - CPX 4/23  with submax test.  FVC 2.27 (66%)      FEV1 1.64 (60%)        FEV1/FVC 72 (92%)        Resting HR: 83 Peak HR: 118   (75% age predicted max HR)  BP rest: 126/64 BP peak: 206/68  Peak VO2: 16.8 (67% predicted peak VO2)  VE/VCO2 slope:  42  Peak RER: 0.86  PETCO2 at peak:  26   - Ltd Echo (10/22): EF 40-45%, moderate LV dysfunction, mild LVH, RV ok  - Echo 03/12/21 EF 20-25%, akinesis of anteroseptal wall and apex, RV okay, no MR, IVC normal in size  - R/LHC 9/22:  severe single vessel CAD, 95-99% ostial LAD s/p PCI + DES. EF 20-25%. RV normal. No MR, moderate pulm hypertension   - Echo 11/24  EF 45-50%, LV with RWMA, GIDD, RV low normal, no MR .  Past Medical History:  Diagnosis Date   Hydrocele in adult    Hypertension    Inguinal hernia    Ischemic cardiomyopathy    Smoker 03/16/2021   smokes 1/2 ppd PTA per hx   STEMI involving left anterior descending coronary artery (HCC) 03/12/2021   Current Outpatient Medications  Medication Sig Dispense Refill   aspirin EC 81 MG tablet Take 1 tablet (81 mg total) by mouth daily. Swallow whole. 30 tablet 1   carvedilol (COREG) 6.25 MG tablet Take 1 tablet (6.25 mg total) by mouth 2 (two) times daily with a meal. Absolute last refill without office visit please call [phone] 60 tablet 1   furosemide (LASIX) 20 MG tablet Take 1 tablet (20 mg total) by mouth as needed for weight gain of 3 lbs in 24 hours or 5 lbs in a week 30 tablet 11   ibuprofen (ADVIL) 600 MG tablet Take 1 tablet (600 mg total) by mouth every 8 (eight) hours as needed. 30 tablet 0   nitroGLYCERIN (NITROSTAT) 0.4 MG SL tablet Place 1 tablet (0.4 mg total) under the tongue every 5 (five) minutes as needed for chest pain. 25 tablet 1   sacubitril-valsartan (ENTRESTO) 49-51 MG Take 1 tablet by  mouth 2 (two) times daily. 60 tablet 6   spironolactone (ALDACTONE) 25 MG tablet Take 1 tablet (25 mg total) by mouth daily. 90 tablet 3   ticagrelor (BRILINTA) 90 MG TABS tablet Take 1 tablet (90 mg total) by mouth 2 (two) times daily. 60 tablet 3   No current facility-administered medications for this encounter.   No Known Allergies  Social History   Socioeconomic History   Marital status: Single    Spouse name: Not on file   Number of children: Not on file   Years of education: Not on file   Highest education level: Not on file  Occupational History   Not on file  Tobacco Use   Smoking status: Former    Current packs/day: 0.50    Types: Cigarettes   Smokeless tobacco: Never  Vaping Use   Vaping status: Never Used  Substance and Sexual Activity   Alcohol use:  No   Drug use: Yes    Types: Marijuana    Comment: occasionally    Sexual activity: Not on file  Other Topics Concern   Not on file  Social History Narrative   ** Merged History Encounter **       Social Drivers of Health   Financial Resource Strain: Low Risk  (09/17/2021)   Overall Financial Resource Strain (CARDIA)    Difficulty of Paying Living Expenses: Not very hard  Food Insecurity: No Food Insecurity (09/17/2021)   Hunger Vital Sign    Worried About Running Out of Food in the Last Year: Never true    Ran Out of Food in the Last Year: Never true  Transportation Needs: No Transportation Needs (09/17/2021)   PRAPARE - Administrator, Civil Service (Medical): No    Lack of Transportation (Non-Medical): No  Physical Activity: Not on file  Stress: Not on file  Social Connections: Not on file  Intimate Partner Violence: Not on file   Family History  Problem Relation Age of Onset   Heart disease Neg Hx    BP (!) 140/88   Pulse 83   Wt 87.5 kg   SpO2 97%   BMI 30.20 kg/m   Wt Readings from Last 3 Encounters:  07/19/23 87.5 kg  06/13/23 89.4 kg  04/28/23 86.5 kg   PHYSICAL EXAM: General:  well appearing, walked into clinic HEENT: normal Neck: supple. JVD to jaw. Carotids 2+ bilat; no bruits. No lymphadenopathy or thyromegaly appreciated. Cor: PMI nondisplaced. Regular rate & rhythm. No rubs, gallops or murmurs. Lungs: clear, diminished Abdomen: soft, nontender, nondistended. No hepatosplenomegaly. No bruits or masses. Good bowel sounds. Extremities: no cyanosis, clubbing, rash, trace ankle edema  Neuro: alert & oriented x 3, cranial nerves grossly intact. moves all 4 extremities w/o difficulty. Affect pleasant.   ReDs reading: 42%, abnormal  1. Chronic Systolic Heart Failure - Secondary to anterior ST elevation MI/OOH arrest in 9/22 - Initial echo EF 20-25% with akinesis of anteroseptal wall and apex - Bedside echo EF 35-40% 03/14/21.  - Limited echo  03/16/21 w/ improved EF, 40-45%   - Echo 09/17/21 LVEF 40%.  - CPX 4/23  with submax test . FVC 2.27 (66%) FEV1 1.64 (60%)  pVO2: 16.8 (67%) VE/VCO2 42 pRER: 0.86  PETCO2 26  - PFTs suggest significant COPD - Echo 11/24 EF 45-50%, LV with RWMA, GIDD, RV low normal, no MR - NYHA III today. Volume elevated on exam. ReDs 42% - Restart Farxiga 10 mg daily.  - Continue Entresto at current  dose. Samples given today. Hopefully will have approval by next visit. Consider switching to losartan if unable to.  - Can take lasix 20 mg daily prn. - Continue carvedilol 6.25 bid - Continue spironolactone 25 gm daily.  - Needs to be compliant with meds - BMET/BNP today.  2. CAD - s/p PCI/DES to LAD as above - Continue ASA - No s/s angina - Continue statin and ? blocker. - No further chest pain.   3. H/o VT/VF arrest - 2/2 #2 - Off amiodarone - Echo 04/28/23: EF 45-50%, GIDD, LV with RWMA, RV low normal. - Does not qualify for ICD.  4. HTN - BP elevated today.  - Continue current regimen, hopefully can get approval and increase Entresto at next visit, as above. - May need to add back amlodipine.   5. Tobacco use - Quit completely - PFTs suggest significant COPD - discussed THC cessation  Follow up in 4 weeks with APP.  [name], FNP-BC 07/19/23

## 2023-07-22 ENCOUNTER — Other Ambulatory Visit (HOSPITAL_COMMUNITY): Payer: Self-pay

## 2023-07-27 ENCOUNTER — Other Ambulatory Visit (HOSPITAL_COMMUNITY): Payer: Self-pay

## 2023-07-29 ENCOUNTER — Other Ambulatory Visit (HOSPITAL_COMMUNITY): Payer: Self-pay

## 2023-08-01 NOTE — Telephone Encounter (Signed)
 Spoke to patient by phone, he has been unable to provide income records as of yet. Pt stated that his boss is returning this week and he will try to provide records to [facility] when able.

## 2023-08-08 ENCOUNTER — Other Ambulatory Visit (HOSPITAL_COMMUNITY): Payer: Self-pay

## 2023-08-09 ENCOUNTER — Other Ambulatory Visit (HOSPITAL_COMMUNITY): Payer: Self-pay

## 2023-08-15 ENCOUNTER — Telehealth (HOSPITAL_COMMUNITY): Payer: Self-pay

## 2023-08-15 NOTE — Telephone Encounter (Signed)
 Called to confirm/remind patient of their appointment at the [facility] on 08/16/23. However, was unable to leave patient a voice message.

## 2023-08-16 ENCOUNTER — Ambulatory Visit (HOSPITAL_COMMUNITY)
Admission: RE | Admit: 2023-08-16 | Discharge: 2023-08-16 | Disposition: A | Discharge status: Home / Self Care | Payer: Self-pay | Source: Ambulatory Visit | Attending: Family Medicine | Admitting: Family Medicine

## 2023-08-16 ENCOUNTER — Other Ambulatory Visit (HOSPITAL_COMMUNITY): Payer: Self-pay

## 2023-08-16 ENCOUNTER — Encounter (HOSPITAL_COMMUNITY): Payer: Self-pay

## 2023-08-16 VITALS — BP 94/64 | HR 103 | Ht 66.0 in | Wt 176.0 lb

## 2023-08-16 DIAGNOSIS — I252 Old myocardial infarction: Secondary | ICD-10-CM | POA: Insufficient documentation

## 2023-08-16 DIAGNOSIS — I110 Hypertensive heart disease with heart failure: Secondary | ICD-10-CM | POA: Insufficient documentation

## 2023-08-16 DIAGNOSIS — Z8674 Personal history of sudden cardiac arrest: Secondary | ICD-10-CM | POA: Insufficient documentation

## 2023-08-16 DIAGNOSIS — Z8679 Personal history of other diseases of the circulatory system: Secondary | ICD-10-CM

## 2023-08-16 DIAGNOSIS — I5022 Chronic systolic (congestive) heart failure: Secondary | ICD-10-CM | POA: Insufficient documentation

## 2023-08-16 DIAGNOSIS — R531 Weakness: Secondary | ICD-10-CM | POA: Insufficient documentation

## 2023-08-16 DIAGNOSIS — Z955 Presence of coronary angioplasty implant and graft: Secondary | ICD-10-CM | POA: Insufficient documentation

## 2023-08-16 DIAGNOSIS — Z7984 Long term (current) use of oral hypoglycemic drugs: Secondary | ICD-10-CM | POA: Insufficient documentation

## 2023-08-16 DIAGNOSIS — I2510 Atherosclerotic heart disease of native coronary artery without angina pectoris: Secondary | ICD-10-CM | POA: Insufficient documentation

## 2023-08-16 DIAGNOSIS — R0602 Shortness of breath: Secondary | ICD-10-CM | POA: Insufficient documentation

## 2023-08-16 DIAGNOSIS — R059 Cough, unspecified: Secondary | ICD-10-CM | POA: Insufficient documentation

## 2023-08-16 DIAGNOSIS — J449 Chronic obstructive pulmonary disease, unspecified: Secondary | ICD-10-CM | POA: Insufficient documentation

## 2023-08-16 DIAGNOSIS — Z79899 Other long term (current) drug therapy: Secondary | ICD-10-CM | POA: Insufficient documentation

## 2023-08-16 DIAGNOSIS — I25119 Atherosclerotic heart disease of native coronary artery with unspecified angina pectoris: Secondary | ICD-10-CM

## 2023-08-16 DIAGNOSIS — I10 Essential (primary) hypertension: Secondary | ICD-10-CM

## 2023-08-16 DIAGNOSIS — F1210 Cannabis abuse, uncomplicated: Secondary | ICD-10-CM

## 2023-08-16 DIAGNOSIS — Z139 Encounter for screening, unspecified: Secondary | ICD-10-CM

## 2023-08-16 DIAGNOSIS — Z87891 Personal history of nicotine dependence: Secondary | ICD-10-CM | POA: Insufficient documentation

## 2023-08-16 LAB — BASIC METABOLIC PANEL
Anion gap: 16 — ABNORMAL HIGH (ref 5–15)
BUN: 15 mg/dL (ref 8–23)
CO2: 19 mmol/L — ABNORMAL LOW (ref 22–32)
Calcium: 9.2 mg/dL (ref 8.9–10.3)
Chloride: 105 mmol/L (ref 98–111)
Creatinine, Ser: 1.5 mg/dL — ABNORMAL HIGH (ref 0.61–1.24)
GFR, Estimated: 51 mL/min — ABNORMAL LOW (ref >60)
Glucose, Bld: 99 mg/dL (ref 70–99)
Potassium: 3.8 mmol/L (ref 3.5–5.1)
Sodium: 140 mmol/L (ref 135–145)

## 2023-08-16 LAB — BRAIN NATRIURETIC PEPTIDE: B Natriuretic Peptide: 47.5 pg/mL (ref 0.0–100.0)

## 2023-08-16 MED ORDER — TICAGRELOR 60 MG PO TABS
60.0000 mg | ORAL_TABLET | Freq: Two times a day (BID) | ORAL | 12 refills | Status: DC
  Filled 2023-08-16 – 2024-02-02 (×2): qty 60, 30d supply, fill #0

## 2023-08-16 NOTE — Patient Instructions (Addendum)
 ONLY TAKE LASIX 20 mg AS NEEDED. Take for weight gain > 3 lbs overnight or 5 lbs in one week. Hold Logan Brewer, and Logan Brewer for three days - then start back at previous dose. Labs today - will call you if abnormal. Food bag provided at today's visit. Our Social Worker, Logan Brewer, will call you. Please keep your phone on. Return to see Logan Brewer in 4 months. Please call us at [phone] in May to schedule this appointment. Please call us at [phone] if any questions or concerns prior to your next visit.

## 2023-08-16 NOTE — Progress Notes (Signed)
 ADVANCED HF CLINIC NOTE   Primary Care: Grayce Sessions, NP HF Cardiologist: Dr. Gala Romney  CC: Heart failure follow up  HPI: Logan Brewer is a 68 y.o. male with history of uncontrolled HTN and heavy, longstanding tobacco use, systolic heart failure, CAD, and VT/VF arrest.   Had VT/VF arrest in 9/22. ECG with anterior STEMI. Cath 99% ostial to proximal LAD treated with PCI/DES. Echo EF 40%.   Readmitted 10/22 with CP and SOB. Repeat echo EF 40-45%, Hstrop negative. Found to have AKI and GDMT held at discharge.   He no showed and cancelled post hospital follow up x 3.  Echo 04/28/23: EF 45-50%, GIDD, LV with RWMA, RV low normal  Today he returns for HF follow up. Overall feeling poorly. He is weak and cough, he thinks he has the flu. Eating and drinking OK. SOB walking on flat ground.  Has abdominal pain when coughs. Denies palpitations, abnormal bleeding, CP, dizziness, or PND/Orthopnea. He is not weighing at home. Taking all medications. Has been taking Lasix daily. Kicked out of apartment, staying with friends. No ETOH or tobacco, smokes THC occasionally. Works in [employer].  Cardiac studies: - Echo 04/28/23: EF 45-50%, GIDD, LV with RWMA, RV low normal  - Echo 3/23 EF 40%  - CPX 4/23  with submax test.  FVC 2.27 (66%)      FEV1 1.64 (60%)        FEV1/FVC 72 (92%)        Resting HR: 83 Peak HR: 118   (75% age predicted max HR)  BP rest: 126/64 BP peak: 206/68  Peak VO2: 16.8 (67% predicted peak VO2)  VE/VCO2 slope:  42  Peak RER: 0.86  PETCO2 at peak:  26   - Ltd Echo (10/22): EF 40-45%, moderate LV dysfunction, mild LVH, RV ok  - Echo 03/12/21 EF 20-25%, akinesis of anteroseptal wall and apex, RV okay, no MR, IVC normal in size  - R/LHC 9/22:  severe single vessel CAD, 95-99% ostial LAD s/p PCI + DES. EF 20-25%. RV normal. No MR, moderate pulm hypertension   - Echo 11/24 EF 45-50%, LV with RWMA, GIDD, RV low normal, no MR .  Past Medical History:   Diagnosis Date   Hydrocele in adult    Hypertension    Inguinal hernia    Ischemic cardiomyopathy    Smoker 03/16/2021   smokes 1/2 ppd PTA per hx   STEMI involving left anterior descending coronary artery (HCC) 03/12/2021   Current Outpatient Medications  Medication Sig Dispense Refill   aspirin EC 81 MG tablet Take 1 tablet (81 mg total) by mouth daily. Swallow whole. 30 tablet 1   carvedilol (COREG) 6.25 MG tablet Take 1 tablet (6.25 mg total) by mouth 2 (two) times daily with a meal. Absolute last refill without office visit please call [phone] 60 tablet 1   dapagliflozin propanediol (FARXIGA) 10 MG TABS tablet Take 1 tablet (10 mg total) by mouth daily before breakfast. 30 tablet 3   furosemide (LASIX) 20 MG tablet Take 1 tablet (20 mg total) by mouth as needed for weight gain of 3 lbs in 24 hours or 5 lbs in a week 30 tablet 11   ibuprofen (ADVIL) 600 MG tablet Take 1 tablet (600 mg total) by mouth every 8 (eight) hours as needed. 30 tablet 0   sacubitril-valsartan (ENTRESTO) 49-51 MG Take 1 tablet by mouth 2 (two) times daily. 60 tablet 6   spironolactone (ALDACTONE) 25 MG tablet Take 1 tablet (  25 mg total) by mouth daily. 90 tablet 3   ticagrelor (BRILINTA) 90 MG TABS tablet Take 1 tablet (90 mg total) by mouth 2 (two) times daily. 60 tablet 3   nitroGLYCERIN (NITROSTAT) 0.4 MG SL tablet Place 1 tablet (0.4 mg total) under the tongue every 5 (five) minutes as needed for chest pain. (Patient not taking: Reported on 08/16/2023) 25 tablet 1   No current facility-administered medications for this encounter.   No Known Allergies  Social History   Socioeconomic History   Marital status: Single    Spouse name: Not on file   Number of children: Not on file   Years of education: Not on file   Highest education level: Not on file  Occupational History   Not on file  Tobacco Use   Smoking status: Former    Current packs/day: 0.50    Types: Cigarettes   Smokeless tobacco:  Never  Vaping Use   Vaping status: Never Used  Substance and Sexual Activity   Alcohol use: No   Drug use: Yes    Types: Marijuana    Comment: occasionally    Sexual activity: Not on file  Other Topics Concern   Not on file  Social History Narrative   ** Merged History Encounter **       Social Drivers of Health   Financial Resource Strain: Low Risk  (09/17/2021)   Overall Financial Resource Strain (CARDIA)    Difficulty of Paying Living Expenses: Not very hard  Food Insecurity: No Food Insecurity (09/17/2021)   Hunger Vital Sign    Worried About Running Out of Food in the Last Year: Never true    Ran Out of Food in the Last Year: Never true  Transportation Needs: No Transportation Needs (09/17/2021)   PRAPARE - Administrator, Civil Service (Medical): No    Lack of Transportation (Non-Medical): No  Physical Activity: Not on file  Stress: Not on file  Social Connections: Not on file  Intimate Partner Violence: Not on file   Family History  Problem Relation Age of Onset   Heart disease Neg Hx    BP 94/64   Pulse (!) 103   Ht 5\' 6"  (1.676 m)   Wt 79.8 kg (176 lb)   SpO2 96%   BMI 28.41 kg/m   Wt Readings from Last 3 Encounters:  08/16/23 79.8 kg (176 lb)  07/19/23 87.5 kg (192 lb 12.8 oz)  06/13/23 89.4 kg (197 lb)   PHYSICAL EXAM: General:  NAD. No resp difficulty, walked into clinic, fatigued apperaing HEENT: Normal Neck: Supple. No JVD. Cor: Tachy regular rate & rhythm. No rubs, gallops or murmurs. Lungs: Clear Abdomen: Soft, nontender, nondistended.  Extremities: No cyanosis, clubbing, rash, edema; dry, flaky skin Neuro: Alert & oriented x 3, moves all 4 extremities w/o difficulty. Affect pleasant.  ReDs reading: 22%, abnormal  ECG (personally reviewed): ST 104 bpm  1. Chronic Systolic Heart Failure - Secondary to anterior ST elevation MI/OOH arrest in 9/22 - Initial echo EF 20-25% with akinesis of anteroseptal wall and apex - Bedside echo  EF 35-40% 03/14/21.  - Limited echo 03/16/21 w/ improved EF, 40-45%   - Echo 09/17/21 LVEF 40%.  - CPX 4/23  with submax test . FVC 2.27 (66%) FEV1 1.64 (60%)  pVO2: 16.8 (67%) VE/VCO2 42 pRER: 0.86  PETCO2 26  - PFTs suggest significant COPD - Echo 11/24 EF 45-50%, LV with RWMA, GIDD, RV low normal, no MR - NYHA III  today, suspect recent viral illness and low volume contributing to symptoms. Hypovolemic today, weight down 14 lbs and ReDs low at 22%.  - Lasix is PRN, I asked him to not take this unless he has swelling. - Hold Entresto, Farxiga and spiro x 3 days, then may resume at home dose after 3 days - Continue carvedilol 6.25 bid - Liberalize fluids - Labs today.  2. CAD - s/p PCI/DES to LAD as above - No s/s angina - Can drop ASA, per Dr. Elissa Hefty previous recs - Continue Brilinta, reduced dose to 60 mg bid. Discussed with Dr. Shirlee Latch - Continue statin and ? blocker.   3. H/o VT/VF arrest - 2/2 #2 - Off amiodarone - Echo 04/28/23: EF 45-50%, GIDD, LV with RWMA, RV low normal. - Does not qualify for ICD. No change  4. HTN - BP low, likely 2/2 to hypovolemia.  - Med changes as above - Liberalize fluids   5. Tobacco use - Quit completely - PFTs suggest significant COPD - discussed THC cessation  6. SDOH - Gifted bag of food today - He has housing insecurity, engage HFSW for resources  Follow up in 4 months with Dr. Demaris Callander, FNP-BC 08/16/23

## 2023-08-16 NOTE — Progress Notes (Signed)
 ReDS Vest / Clip - 08/16/23 1545       ReDS Vest / Clip   Station Marker A    Ruler Value 32    ReDS Value Range Low volume    ReDS Actual Value 22

## 2023-08-17 ENCOUNTER — Other Ambulatory Visit (HOSPITAL_COMMUNITY): Payer: Self-pay

## 2023-08-17 ENCOUNTER — Telehealth (HOSPITAL_COMMUNITY): Payer: Self-pay

## 2023-08-17 ENCOUNTER — Telehealth (HOSPITAL_COMMUNITY): Payer: Self-pay | Admitting: Licensed Clinical Social Worker

## 2023-08-17 DIAGNOSIS — I5022 Chronic systolic (congestive) heart failure: Secondary | ICD-10-CM

## 2023-08-17 NOTE — Telephone Encounter (Signed)
-----   Message from Jacklynn Ganong sent at 08/17/2023  8:08 AM EST ----- Renal function up. No change to plan discussed at visit yesterday  Please repeat BMET in 10 days

## 2023-08-17 NOTE — Telephone Encounter (Signed)
 H&V Care Navigation CSW Progress Note  Clinical Social Worker consulted to speak with pt regarding housing concerns.  Pt reports he had been living with a friend but that the friend had not been able to keep up with utility expenses and that this lead to him having to leave the apartment.  Is now staying with other friends but 7 other people live there and he is on a couch in the living room.  Works part time but very inconsistent pay anywhere from $50-300 a week in [employer]- it is off season for him at this time so less than usual.  With this inconsistent income would be very difficult to find affordable housing options outside income based housing.  CSW mailing resources to patient for review- explained he would have waitlist with these properties.  CSW inquired if he had ever attempted to apply for Medicaid or Social Security benefits.  Patient reports he has not attempted to apply.  States he is not a [place] citizen but has been here since 1990 and is a Physiological [employer] resident.  Explained that since he has this status and has had it for more than 5 years he could be eligible for benefits in the [place].  CSW sent email to DHHS workers at [facility] with patients permission to speak with him regarding medicaid.  CSW also provided with SSA number to call and inquire if he would qualify for retirement benefits.  Hope would be with stable income he could find his own housing.  Will plan to see patient at follow up appt with clinic in 2 weeks to check on status of SSA and Medicaid.   SDOH Screenings   Food Insecurity: No Food Insecurity (09/17/2021)  Housing: Medium Risk (09/18/2021)  Transportation Needs: No Transportation Needs (09/17/2021)  Depression (PHQ2-9): Low Risk  (03/24/2022)  Financial Resource Strain: Low Risk  (09/17/2021)  Tobacco Use: Medium Risk (08/16/2023)   Burna Sis, LCSW Clinical Social Worker [facility] Desk#: [phone] Cell#:  [phone]

## 2023-08-17 NOTE — Telephone Encounter (Signed)
 Patient's labs has been placed and appointment scheduled.  Pt aware, agreeable, and verbalized understanding

## 2023-08-29 ENCOUNTER — Other Ambulatory Visit (HOSPITAL_COMMUNITY): Payer: Self-pay

## 2023-08-31 ENCOUNTER — Ambulatory Visit (HOSPITAL_COMMUNITY)
Admission: RE | Admit: 2023-08-31 | Discharge: 2023-08-31 | Disposition: A | Discharge status: Home / Self Care | Payer: Self-pay | Source: Ambulatory Visit | Attending: Cardiology | Admitting: Cardiology

## 2023-08-31 DIAGNOSIS — I5022 Chronic systolic (congestive) heart failure: Secondary | ICD-10-CM | POA: Insufficient documentation

## 2023-08-31 LAB — BASIC METABOLIC PANEL
Anion gap: 4 — ABNORMAL LOW (ref 5–15)
BUN: 14 mg/dL (ref 8–23)
CO2: 24 mmol/L (ref 22–32)
Calcium: 8.8 mg/dL — ABNORMAL LOW (ref 8.9–10.3)
Chloride: 111 mmol/L (ref 98–111)
Creatinine, Ser: 1.38 mg/dL — ABNORMAL HIGH (ref 0.61–1.24)
GFR, Estimated: 57 mL/min — ABNORMAL LOW (ref >60)
Glucose, Bld: 92 mg/dL (ref 70–99)
Potassium: 3.7 mmol/L (ref 3.5–5.1)
Sodium: 139 mmol/L (ref 135–145)

## 2023-08-31 NOTE — Addendum Note (Signed)
 Encounter addended by: Burna Sis, LCSW on: 08/31/2023 4:16 PM  Actions taken: Clinical Note Signed

## 2023-08-31 NOTE — Progress Notes (Signed)
 H&V Care Navigation CSW Progress Note  Clinical Social Worker checked in with patient regarding SSA and Medicaid.  Pt states he called SSA but could not speak with anyone- will call again.  States he never heard from [facility] worker- CSW sent follow up email to [facility] worker to inquire- awaiting response.   SDOH Screenings   Food Insecurity: No Food Insecurity (09/17/2021)  Housing: Medium Risk (09/18/2021)  Transportation Needs: No Transportation Needs (09/17/2021)  Depression (PHQ2-9): Low Risk  (03/24/2022)  Financial Resource Strain: Low Risk  (09/17/2021)  Tobacco Use: Medium Risk (08/16/2023)   Burna Sis, LCSW Clinical Social Worker [facility] Desk#: [phone] Cell#: [phone]

## 2023-10-10 NOTE — Telephone Encounter (Signed)
 Received shipment notification from [employer] that Brilinta , Farxiga  should be delivered to pt within 7-10 business days of 10/07/2023.

## 2023-10-17 ENCOUNTER — Ambulatory Visit: Payer: Self-pay

## 2023-10-17 NOTE — Telephone Encounter (Signed)
  Chief Complaint: chest pain Symptoms: SOB, fatigue, intermittent mild chest pain Frequency: x couple of weeks Pertinent Negatives: Patient denies nausea, vomiting, sweating, cough, fever Disposition: [x] ED /[] Urgent Care (no appt availability in office) / [] Appointment(In office/virtual)/ []  [facility]/ [] Home Care/ [x] Refused Recommended Disposition /[] [facility]/ []  Follow-up with PCP Additional Notes: Patient states he takes SL nitrogylcerin and the pain goes away. Patient states he needs refills on all of his medications. Patient asking to wait to see PCP for 2 weeks, advised he go straight to ED. Patient states he doesn't feel that bad so he will not go today. Advised patient consider going to ED and that he can not wait 2 weeks to be seen by PCP. Patient states he has not called his cardiologist. Called [facility] and spoke with Ardis Krebs, notified of ED refusal.  Copied from CRM [id number]. Topic: Clinical - Red Word Triage >> Oct 17, 2023  2:47 PM Leory Rands wrote: Red Word that prompted transfer to Nurse Triage: Patient calling to report occasion chest pain and fatigue. Reason for Disposition  [1] Chest pain (or "angina") comes and goes AND [2] is happening more often (increasing in frequency) or getting worse (increasing in severity)  (Exception: Chest pains that last only a few seconds.)  Answer Assessment - Initial Assessment Questions 1. LOCATION: "Where does it hurt?"       Center of chest.  2. RADIATION: "Does the pain go anywhere else?" (e.g., into neck, jaw, arms, back)     Denies.  3. ONSET: "When did the chest pain begin?" (Minutes, hours or days)      Couple of weeks.  4. PATTERN: "Does the pain come and go, or has it been constant since it started?"  "Does it get worse with exertion?"      Comes and goes, feels it when moving around or walking.  5. DURATION: "How long does it last" (e.g., seconds, minutes, hours)     5-10 minutes.  6. SEVERITY: "How  bad is the pain?"  (e.g., Scale 1-10; mild, moderate, or severe)    - MILD (1-3): doesn't interfere with normal activities     - MODERATE (4-7): interferes with normal activities or awakens from sleep    - SEVERE (8-10): excruciating pain, unable to do any normal activities       No chest pain at present, mild when it does come on.  7. CARDIAC RISK FACTORS: "Do you have any history of heart problems or risk factors for heart disease?" (e.g., angina, prior heart attack; diabetes, high blood pressure, high cholesterol, smoker, or strong family history of heart disease)     CHF, NSTEMI, CAD  8. PULMONARY RISK FACTORS: "Do you have any history of lung disease?"  (e.g., blood clots in lung, asthma, emphysema, birth control pills)     Denies.  9. CAUSE: "What do you think is causing the chest pain?"     Unsure.  10. OTHER SYMPTOMS: "Do you have any other symptoms?" (e.g., dizziness, nausea, vomiting, sweating, fever, difficulty breathing, cough)       SOB, fatigue.  11. PREGNANCY: "Is there any chance you are pregnant?" "When was your last menstrual period?"       N/A.  Protocols used: Chest Pain-A-AH

## 2023-10-28 ENCOUNTER — Telehealth (INDEPENDENT_AMBULATORY_CARE_PROVIDER_SITE_OTHER): Payer: Self-pay | Admitting: Primary Care

## 2023-10-28 NOTE — Telephone Encounter (Signed)
 Please reach out to pt to get more information

## 2023-10-28 NOTE — Telephone Encounter (Signed)
 Copied from CRM [id number]. Topic: General - Other >> Oct 27, 2023 11:42 AM Baldemar Lev wrote: Reason for CRM: Pt wants to contact his caseworker   Best contact: [phone]

## 2023-11-02 NOTE — Telephone Encounter (Signed)
 Patient following up on contacting case worker. Patient needing assistance getting a bed mattress and box spring.   Best call back number; [phone]

## 2023-11-02 NOTE — Telephone Encounter (Signed)
 Please send to correct office.

## 2023-11-02 NOTE — Telephone Encounter (Signed)
 My Apologies. Routing to correct office.

## 2023-11-04 ENCOUNTER — Other Ambulatory Visit (HOSPITAL_COMMUNITY): Payer: Self-pay

## 2023-11-04 ENCOUNTER — Other Ambulatory Visit (HOSPITAL_COMMUNITY): Payer: Self-pay | Admitting: Family Medicine

## 2023-11-04 MED ORDER — SPIRONOLACTONE 25 MG PO TABS
25.0000 mg | ORAL_TABLET | Freq: Every day | ORAL | 3 refills | Status: DC
  Filled 2023-11-04: qty 90, 90d supply, fill #0
  Filled 2023-11-21: qty 30, 30d supply, fill #0
  Filled 2024-02-02: qty 30, 30d supply, fill #1

## 2023-11-15 ENCOUNTER — Other Ambulatory Visit (HOSPITAL_COMMUNITY): Payer: Self-pay

## 2023-11-21 ENCOUNTER — Other Ambulatory Visit (HOSPITAL_COMMUNITY): Payer: Self-pay

## 2023-11-27 ENCOUNTER — Other Ambulatory Visit: Payer: Self-pay

## 2023-11-27 ENCOUNTER — Emergency Department (HOSPITAL_COMMUNITY): Payer: Self-pay

## 2023-11-27 ENCOUNTER — Encounter (HOSPITAL_COMMUNITY): Payer: Self-pay

## 2023-11-27 ENCOUNTER — Emergency Department (HOSPITAL_COMMUNITY)
Admission: EM | Admit: 2023-11-27 | Discharge: 2023-11-28 | Disposition: A | Discharge status: Home / Self Care | Payer: Self-pay | Attending: Emergency Medicine | Admitting: Emergency Medicine

## 2023-11-27 DIAGNOSIS — M549 Dorsalgia, unspecified: Secondary | ICD-10-CM | POA: Insufficient documentation

## 2023-11-27 DIAGNOSIS — Z202 Contact with and (suspected) exposure to infections with a predominantly sexual mode of transmission: Secondary | ICD-10-CM | POA: Insufficient documentation

## 2023-11-27 DIAGNOSIS — I509 Heart failure, unspecified: Secondary | ICD-10-CM | POA: Insufficient documentation

## 2023-11-27 DIAGNOSIS — R7989 Other specified abnormal findings of blood chemistry: Secondary | ICD-10-CM | POA: Insufficient documentation

## 2023-11-27 DIAGNOSIS — I2510 Atherosclerotic heart disease of native coronary artery without angina pectoris: Secondary | ICD-10-CM | POA: Insufficient documentation

## 2023-11-27 DIAGNOSIS — J449 Chronic obstructive pulmonary disease, unspecified: Secondary | ICD-10-CM | POA: Insufficient documentation

## 2023-11-27 DIAGNOSIS — I110 Hypertensive heart disease with heart failure: Secondary | ICD-10-CM | POA: Insufficient documentation

## 2023-11-27 DIAGNOSIS — R059 Cough, unspecified: Secondary | ICD-10-CM | POA: Insufficient documentation

## 2023-11-27 DIAGNOSIS — Z79899 Other long term (current) drug therapy: Secondary | ICD-10-CM | POA: Insufficient documentation

## 2023-11-27 LAB — COMPREHENSIVE METABOLIC PANEL WITH GFR
ALT: 14 U/L (ref 0–44)
AST: 19 U/L (ref 15–41)
Albumin: 3.8 g/dL (ref 3.5–5.0)
Alkaline Phosphatase: 68 U/L (ref 38–126)
Anion gap: 9 (ref 5–15)
BUN: 10 mg/dL (ref 8–23)
CO2: 25 mmol/L (ref 22–32)
Calcium: 9.2 mg/dL (ref 8.9–10.3)
Chloride: 102 mmol/L (ref 98–111)
Creatinine, Ser: 1.44 mg/dL — ABNORMAL HIGH (ref 0.61–1.24)
GFR, Estimated: 54 mL/min — ABNORMAL LOW (ref >60)
Glucose, Bld: 100 mg/dL — ABNORMAL HIGH (ref 70–99)
Potassium: 3.6 mmol/L (ref 3.5–5.1)
Sodium: 136 mmol/L (ref 135–145)
Total Bilirubin: 0.7 mg/dL (ref 0.0–1.2)
Total Protein: 7.7 g/dL (ref 6.5–8.1)

## 2023-11-27 LAB — CBC WITH DIFFERENTIAL/PLATELET
Abs Immature Granulocytes: 0.02 10*3/uL (ref 0.00–0.07)
Basophils Absolute: 0 10*3/uL (ref 0.0–0.1)
Basophils Relative: 1 %
Eosinophils Absolute: 0.2 10*3/uL (ref 0.0–0.5)
Eosinophils Relative: 3 %
HCT: 48.8 % (ref 39.0–52.0)
Hemoglobin: 16.5 g/dL (ref 13.0–17.0)
Immature Granulocytes: 0 %
Lymphocytes Relative: 41 %
Lymphs Abs: 2.2 10*3/uL (ref 0.7–4.0)
MCH: 32 pg (ref 26.0–34.0)
MCHC: 33.8 g/dL (ref 30.0–36.0)
MCV: 94.8 fL (ref 80.0–100.0)
Monocytes Absolute: 0.6 10*3/uL (ref 0.1–1.0)
Monocytes Relative: 11 %
Neutro Abs: 2.4 10*3/uL (ref 1.7–7.7)
Neutrophils Relative %: 44 %
Platelets: 272 10*3/uL (ref 150–400)
RBC: 5.15 MIL/uL (ref 4.22–5.81)
RDW: 12.5 % (ref 11.5–15.5)
WBC: 5.3 10*3/uL (ref 4.0–10.5)
nRBC: 0 % (ref 0.0–0.2)

## 2023-11-27 LAB — RAPID HIV SCREEN (HIV 1/2 AB+AG)
HIV 1/2 Antibodies: NONREACTIVE
HIV-1 P24 Antigen - HIV24: NONREACTIVE

## 2023-11-27 LAB — TROPONIN I (HIGH SENSITIVITY)
Troponin I (High Sensitivity): 32 ng/L — ABNORMAL HIGH (ref <18)
Troponin I (High Sensitivity): 31 ng/L — ABNORMAL HIGH (ref <18)

## 2023-11-27 LAB — BRAIN NATRIURETIC PEPTIDE: B Natriuretic Peptide: 89.7 pg/mL (ref 0.0–100.0)

## 2023-11-27 NOTE — ED Provider Triage Note (Signed)
 Emergency Medicine Provider Triage Evaluation Note  Logan Brewer , a 68 y.o. male  was evaluated in triage.  Pt complains of wanting STD check.  Is having some lower back bilateral pain but does have a history of chronic back pain.  No dysuria, hematuria, urgency, frequency, penile drip or pain or swelling to his testicles or scrotum.  He has been having a cough of the past few weeks with some shortness of breath but basely at its baseline.  No chest pain.  No runny nose, nasal regimen, or fever.  Review of Systems  Positive:  Negative:   Physical Exam  BP (!) 153/87   Pulse 68   Temp 98.4 F (36.9 C)   Resp 18   Ht 5\' 6"  (1.676 m)   Wt 79.8 kg   SpO2 100%   BMI 28.40 kg/m  Gen:   Awake, no distress   Resp:  Normal effort  MSK:   Moves extremities without difficulty  Other:    Medical Decision Making  Medically screening exam initiated at 5:51 PM.  Appropriate orders placed.  Logan Brewer was informed that the remainder of the evaluation will be completed by another provider, this initial triage assessment does not replace that evaluation, and the importance of remaining in the ED until their evaluation is complete.  Labs and imaging ordered   Spence Dux, [place] 11/27/23 6213

## 2023-11-27 NOTE — ED Triage Notes (Signed)
 Pt reports BIL lower back "heat" and pain after unprotected oral sex 2 weeks ago. No urogenital symptoms. Pt also endorses a "cold" in his chest x2 weeks.

## 2023-11-27 NOTE — ED Provider Notes (Signed)
 [facility] Provider Note   CSN: [id number] Arrival date & time: 11/27/23  1622     History {Add pertinent medical, surgical, social history, OB history to HPI:1} Chief Complaint  Patient presents with   Back Pain    Logan Brewer is a 68 y.o. male.   Back Pain Associated symptoms: no dysuria   Patient presents for concern of STI.  Medical history includes HTN, CAD, anxiety, chronic back pain, HLD, CHF, prediabetes, COPD.  He underwent PCI 3 years ago.  Echocardiogram last year showed EF of 45 to 50%.  He is prescribed Lasix  as needed.  Other medications include carvedilol , Entresto , spironolactone , Brilinta .  He states that he has recently had unprotected sex and does want to get checked for STI.  He denies any recent dysuria, genital skin lesions, penile discharge.  He has had a recent sensation of warmth in area of right buttock.  He denies pain or swelling to this area.  It is noticeable when he is sitting down.  It improves with standing and ambulation.  He denies any other recent symptoms.     Home Medications Prior to Admission medications   Medication Sig Start Date End Date Taking? Authorizing Provider  carvedilol  (COREG ) 6.25 MG tablet Take 1 tablet (6.25 mg total) by mouth 2 (two) times daily with a meal. Absolute last refill without office visit please call [phone] 07/12/23   Bensimhon, Rheta Celestine, MD  dapagliflozin  propanediol (FARXIGA ) 10 MG TABS tablet Take 1 tablet (10 mg total) by mouth daily before breakfast. 07/19/23   [place], Arlice Bene, FNP  furosemide  (LASIX ) 20 MG tablet Take 1 tablet (20 mg total) by mouth as needed for weight gain of 3 lbs in 24 hours or 5 lbs in a week 06/13/23   Sheryl Donna, NP  ibuprofen  (ADVIL ) 600 MG tablet Take 1 tablet (600 mg total) by mouth every 8 (eight) hours as needed. 06/28/22   Marius Siemens, NP  nitroGLYCERIN  (NITROSTAT ) 0.4 MG SL tablet Place 1 tablet (0.4 mg total) under the  tongue every 5 (five) minutes as needed for chest pain. Patient not taking: Reported on 08/16/2023 04/28/23   Sheryl Donna, NP  sacubitril -valsartan  (ENTRESTO ) 49-51 MG Take 1 tablet by mouth 2 (two) times daily. 07/19/23   Elmarie Hacking, FNP  spironolactone  (ALDACTONE ) 25 MG tablet Take 1 tablet (25 mg total) by mouth daily. 11/04/23   Milford, Arlice Bene, FNP  ticagrelor  (BRILINTA ) 60 MG TABS tablet Take 1 tablet (60 mg total) by mouth 2 (two) times daily. 08/16/23   Elmarie Hacking, FNP      Allergies    Patient has no known allergies.    Review of Systems   Review of Systems  Genitourinary:  Negative for dysuria, penile discharge, penile pain, penile swelling, scrotal swelling and testicular pain.  All other systems reviewed and are negative.   Physical Exam Updated Vital Signs BP (!) 153/87   Pulse 68   Temp 98.4 F (36.9 C)   Resp 18   Ht 5\' 6"  (1.676 m)   Wt 79.8 kg   SpO2 100%   BMI 28.40 kg/m  Physical Exam Vitals and nursing note reviewed.  Constitutional:      General: He is not in acute distress.    Appearance: Normal appearance. He is well-developed. He is not ill-appearing, toxic-appearing or diaphoretic.  HENT:     Head: Normocephalic and atraumatic.     Right Ear:  External ear normal.     Left Ear: External ear normal.     Nose: Nose normal.     Mouth/Throat:     Mouth: Mucous membranes are moist.  Eyes:     Extraocular Movements: Extraocular movements intact.     Conjunctiva/sclera: Conjunctivae normal.  Cardiovascular:     Rate and Rhythm: Normal rate and regular rhythm.  Pulmonary:     Effort: Pulmonary effort is normal. No respiratory distress.  Abdominal:     General: There is no distension.     Palpations: Abdomen is soft.  Musculoskeletal:        General: No swelling. Normal range of motion.     Cervical back: Normal range of motion and neck supple.  Skin:    General: Skin is warm and dry.     Coloration: Skin is not jaundiced or pale.   Neurological:     General: No focal deficit present.     Mental Status: He is alert and oriented to person, place, and time.     Cranial Nerves: No cranial nerve deficit.     Sensory: No sensory deficit.     Motor: No weakness.     Coordination: Coordination normal.  Psychiatric:        Mood and Affect: Mood normal.        Behavior: Behavior normal.     ED Results / Procedures / Treatments   Labs (all labs ordered are listed, but only abnormal results are displayed) Labs Reviewed  COMPREHENSIVE METABOLIC PANEL WITH GFR - Abnormal; Notable for the following components:      Result Value   Glucose, Bld 100 (*)    Creatinine, Ser 1.44 (*)    GFR, Estimated 54 (*)    All other components within normal limits  TROPONIN I (HIGH SENSITIVITY) - Abnormal; Notable for the following components:   Troponin I (High Sensitivity) 32 (*)    All other components within normal limits  TROPONIN I (HIGH SENSITIVITY) - Abnormal; Notable for the following components:   Troponin I (High Sensitivity) 31 (*)    All other components within normal limits  RAPID HIV SCREEN (HIV 1/2 AB+AG)  BRAIN NATRIURETIC PEPTIDE  CBC WITH DIFFERENTIAL/PLATELET  RPR  GC/CHLAMYDIA PROBE AMP ([place]) NOT AT [facility]    EKG None  Radiology DG Chest 2 View Result Date: 11/27/2023 CLINICAL DATA:  Cough, back pain EXAM: CHEST - 2 VIEW COMPARISON:  06/22/2022 FINDINGS: Heart and mediastinal contours are within normal limits. No focal opacities or effusions. No acute bony abnormality. IMPRESSION: No active cardiopulmonary disease. Electronically Signed   By: Janeece Mechanic M.D.   On: 11/27/2023 18:28    Procedures Procedures  {Document cardiac monitor, telemetry assessment procedure when appropriate:1}  Medications Ordered in ED Medications - No data to display  ED Course/ Medical Decision Making/ A&P   {   Click here for ABCD2, HEART and other calculatorsREFRESH Note before signing :1}                               Medical Decision Making  Patient presenting for concern of STI.  Although he has not had any recent urinary, scrotal, or penile symptoms, he does endorse recent unprotected sex and does want to get checked.  The only recent symptom he endorses is a warm sensation in his right buttock area.  He denies associated pain.  On exam, there is no erythema or  other skin changes.  There is no tenderness.  I suspect mild paresthesia of gluteal nerve from sitting.  Patient was given reassurance regarding this recent symptom.  His labs shows normal hemoglobin, no leukocytosis, baseline creatinine, normal electrolytes.  His troponin is slightly elevated but this is consistent with prior lab work.  His EKG is consistent with prior tracings.  Urinalysis***  {Document critical care time when appropriate:1} {Document review of labs and clinical decision tools ie heart score, Chads2Vasc2 etc:1}  {Document your independent review of radiology images, and any outside records:1} {Document your discussion with family members, caretakers, and with consultants:1} {Document social determinants of health affecting pt's care:1} {Document your decision making why or why not admission, treatments were needed:1} Final Clinical Impression(s) / ED Diagnoses Final diagnoses:  None    Rx / DC Orders ED Discharge Orders     None

## 2023-11-28 LAB — URINALYSIS, ROUTINE W REFLEX MICROSCOPIC
Bacteria, UA: NONE SEEN
Bilirubin Urine: NEGATIVE
Glucose, UA: NEGATIVE mg/dL
Hgb urine dipstick: NEGATIVE
Ketones, ur: NEGATIVE mg/dL
Leukocytes,Ua: NEGATIVE
Nitrite: NEGATIVE
Protein, ur: 30 mg/dL — AB
Specific Gravity, Urine: 1.023 (ref 1.005–1.030)
pH: 5 (ref 5.0–8.0)

## 2023-11-28 LAB — GC/CHLAMYDIA PROBE AMP (~~LOC~~) NOT AT ARMC
Chlamydia: NEGATIVE
Comment: NEGATIVE
Comment: NORMAL
Neisseria Gonorrhea: NEGATIVE

## 2023-11-28 LAB — RPR: RPR Ser Ql: NONREACTIVE

## 2023-11-28 MED ORDER — ALBUTEROL SULFATE HFA 108 (90 BASE) MCG/ACT IN AERS
1.0000 | INHALATION_SPRAY | Freq: Once | RESPIRATORY_TRACT | Status: AC
  Administered 2023-11-28: 1 via RESPIRATORY_TRACT
  Filled 2023-11-28: qty 6.7

## 2023-11-28 NOTE — Discharge Instructions (Addendum)
 Your test results today are reassuring.  Test results for syphilis, gonorrhea and chlamydia will be available over the next 1 to 2 days.  Follow-up on MyChart or with your PCP.  Use inhaler as needed for cough.  Continue your other home medications as prescribed.

## 2024-01-27 ENCOUNTER — Telehealth (INDEPENDENT_AMBULATORY_CARE_PROVIDER_SITE_OTHER): Payer: Self-pay | Admitting: Primary Care

## 2024-01-27 ENCOUNTER — Other Ambulatory Visit (INDEPENDENT_AMBULATORY_CARE_PROVIDER_SITE_OTHER): Payer: Self-pay | Admitting: Primary Care

## 2024-01-27 DIAGNOSIS — R52 Pain, unspecified: Secondary | ICD-10-CM

## 2024-01-27 DIAGNOSIS — I151 Hypertension secondary to other renal disorders: Secondary | ICD-10-CM

## 2024-01-27 NOTE — Telephone Encounter (Unsigned)
 Copied from CRM [id number]. Topic: Clinical - Medication Refill >> Jan 27, 2024 12:03 PM Montie POUR wrote: Medication:  carvedilol  (COREG ) 6.25 MG tablet dapagliflozin  propanediol (FARXIGA ) 10 MG TABS tablet  furosemide  (LASIX ) 20 MG tablet  ibuprofen  (ADVIL ) 600 MG tablet sacubitril -valsartan  (ENTRESTO ) 49-51 MG nitroGLYCERIN  (NITROSTAT ) 0.4 MG SL tablet sacubitril -valsartan  (ENTRESTO ) 49-51 MG spironolactone  (ALDACTONE ) 25 MG tablet ticagrelor  (BRILINTA ) 60 MG TABS tablet  Has the patient contacted their pharmacy? Yes (Agent: If no, request that the patient contact the pharmacy for the refill. If patient does not wish to contact the pharmacy document the reason why and proceed with request.) (Agent: If yes, when and what did the pharmacy advise?) Pharmacy needs orders to refill all medications  This is the patient's preferred pharmacy:  [facility] [address], [address] [place] [address] Phone: [phone] Fax: [phone]   Is this the correct pharmacy for this prescription? Yes If no, delete pharmacy and type the correct one.   Has the prescription been filled recently? Yes  Is the patient out of the medication? No  Has the patient been seen for an appointment in the last year OR does the patient have an upcoming appointment? Yes  Can we respond through MyChart? No  Agent: Please be advised that Rx refills may take up to 3 business days. We ask that you follow-up with your pharmacy.

## 2024-01-27 NOTE — Telephone Encounter (Signed)
 Copied from CRM [id number]. Topic: Clinical - Medication Refill >> Jan 27, 2024 12:03 PM Montie POUR wrote: Medication:  carvedilol  (COREG ) 6.25 MG tablet dapagliflozin  propanediol (FARXIGA ) 10 MG TABS tablet  furosemide  (LASIX ) 20 MG tablet  ibuprofen  (ADVIL ) 600 MG tablet sacubitril -valsartan  (ENTRESTO ) 49-51 MG nitroGLYCERIN  (NITROSTAT ) 0.4 MG SL tablet sacubitril -valsartan  (ENTRESTO ) 49-51 MG spironolactone  (ALDACTONE ) 25 MG tablet ticagrelor  (BRILINTA ) 60 MG TABS tablet  Has the patient contacted their pharmacy? Yes (Agent: If no, request that the patient contact the pharmacy for the refill. If patient does not wish to contact the pharmacy document the reason why and proceed with request.) (Agent: If yes, when and what did the pharmacy advise?) Pharmacy needs orders to refill all medications  This is the patient's preferred pharmacy:  [facility] [address], [address] [place] [address] Phone: [phone] Fax: [phone]   Is this the correct pharmacy for this prescription? Yes If no, delete pharmacy and type the correct one.   Has the prescription been filled recently? Yes  Is the patient out of the medication? No  Has the patient been seen for an appointment in the last year OR does the patient have an upcoming appointment? Yes  Can we respond through MyChart? No  Agent: Please be advised that Rx refills may take up to 3 business days. We ask that you follow-up with your pharmacy.

## 2024-01-31 NOTE — Telephone Encounter (Signed)
 Requested medication (s) are due for refill today: yes  Requested medication (s) are on the active medication list: yes  Last refill:  06/28/22 #30  Future visit scheduled: yes  Notes to clinic:  Cr outside normal range   Requested Prescriptions  Pending Prescriptions Disp Refills   ibuprofen  (ADVIL ) 600 MG tablet 30 tablet     Sig: Take 1 tablet (600 mg total) by mouth every 8 (eight) hours as needed.     Analgesics:  NSAIDS Failed - 01/31/2024  1:33 PM      Failed - Manual Review: Labs are only required if the patient has taken medication for more than 8 weeks.      Failed - Cr in normal range and within 360 days    Creatinine, Ser  Date Value Ref Range Status  11/27/2023 1.44 (H) 0.61 - 1.24 mg/dL Final         Failed - Valid encounter within last 12 months    Recent Outpatient Visits           1 year ago Bilateral swelling of feet   [place] [facility] Celestia Rosaline SQUIBB, NP   1 year ago STD (male)   [place] [facility] Celestia Rosaline SQUIBB, NP   1 year ago Pain of right side of body   [place] [facility] Celestia Rosaline SQUIBB, NP   1 year ago Healthcare maintenance   [place] [facility] Celestia Rosaline SQUIBB, NP   2 years ago Prediabetes   [place] [facility] Celestia Rosaline SQUIBB, NP              Passed - HGB in normal range and within 360 days    Hemoglobin  Date Value Ref Range Status  11/27/2023 16.5 13.0 - 17.0 g/dL Final   Total hemoglobin  Date Value Ref Range Status  03/19/2021 12.5 12.0 - 16.0 g/dL Final         Passed - PLT in normal range and within 360 days    Platelets  Date Value Ref Range Status  11/27/2023 272 150 - 400 K/uL Final         Passed - HCT in normal range and within 360 days    HCT  Date Value Ref Range Status  11/27/2023 48.8 39.0 - 52.0 % Final         Passed - eGFR is 30 or above and within 360 days    GFR calc Af Amer   Date Value Ref Range Status  11/16/2019 >60 >60 mL/min Final   GFR, Estimated  Date Value Ref Range Status  11/27/2023 54 (L) >60 mL/min Final    Comment:    (NOTE) Calculated using the CKD-EPI Creatinine Equation (2021)    eGFR  Date Value Ref Range Status  04/23/2021 48 (L) >59 mL/min/1.73 Final   EGFR  Date Value Ref Range Status  01/13/2023 56.0  Final    Comment:    Abstracted by HIM         Passed - Patient is not pregnant      Refused Prescriptions Disp Refills   carvedilol  (COREG ) 6.25 MG tablet 60 tablet     Sig: Take 1 tablet (6.25 mg total) by mouth 2 (two) times daily with a meal. Absolute last refill without office visit please call [phone]     Cardiovascular: Beta Blockers 3 Failed - 01/31/2024  1:33 PM      Failed - Cr  in normal range and within 360 days    Creatinine, Ser  Date Value Ref Range Status  11/27/2023 1.44 (H) 0.61 - 1.24 mg/dL Final         Failed - Valid encounter within last 6 months    Recent Outpatient Visits           1 year ago Bilateral swelling of feet   [place] [facility] Celestia Rosaline SQUIBB, NP   1 year ago STD (male)   [place] [facility] Celestia Rosaline SQUIBB, NP   1 year ago Pain of right side of body   [place] [facility] Celestia Rosaline SQUIBB, NP   1 year ago Healthcare maintenance   [place] [facility] Celestia Rosaline SQUIBB, NP   2 years ago Prediabetes   [place] [facility] Celestia Rosaline SQUIBB, NP              Passed - AST in normal range and within 360 days    AST  Date Value Ref Range Status  11/27/2023 19 15 - 41 U/L Final         Passed - ALT in normal range and within 360 days    ALT  Date Value Ref Range Status  11/27/2023 14 0 - 44 U/L Final         Passed - Last BP in normal range    BP Readings from Last 1 Encounters:  11/28/23 135/76         Passed - Last Heart Rate in normal range     Pulse Readings from Last 1 Encounters:  11/28/23 (!) 56          dapagliflozin  propanediol (FARXIGA ) 10 MG TABS tablet      Sig: Take 1 tablet (10 mg total) by mouth daily before breakfast.     Endocrinology:  Diabetes - SGLT2 Inhibitors Failed - 01/31/2024  1:33 PM      Failed - Cr in normal range and within 360 days    Creatinine, Ser  Date Value Ref Range Status  11/27/2023 1.44 (H) 0.61 - 1.24 mg/dL Final         Failed - HBA1C is between 0 and 7.9 and within 180 days    Hgb A1c MFr Bld  Date Value Ref Range Status  03/02/2022 6.2 (H) 4.8 - 5.6 % Final    Comment:    (NOTE) Pre diabetes:          5.7%-6.4%  Diabetes:              >6.4%  Glycemic control for   <7.0% adults with diabetes          Failed - eGFR in normal range and within 360 days    GFR calc Af Amer  Date Value Ref Range Status  11/16/2019 >60 >60 mL/min Final   GFR, Estimated  Date Value Ref Range Status  11/27/2023 54 (L) >60 mL/min Final    Comment:    (NOTE) Calculated using the CKD-EPI Creatinine Equation (2021)    eGFR  Date Value Ref Range Status  04/23/2021 48 (L) >59 mL/min/1.73 Final   EGFR  Date Value Ref Range Status  01/13/2023 56.0  Final    Comment:    Abstracted by HIM         Failed - Valid encounter within last 6 months    Recent Outpatient Visits  1 year ago Bilateral swelling of feet   [place] [facility] Celestia Rosaline SQUIBB, NP   1 year ago STD (male)   [place] [facility] Celestia Rosaline SQUIBB, NP   1 year ago Pain of right side of body   [place] [facility] Celestia Rosaline SQUIBB, NP   1 year ago Healthcare maintenance   [place] [facility] Celestia Rosaline SQUIBB, NP   2 years ago Prediabetes   [place] [facility] Celestia Rosaline SQUIBB, NP               furosemide  (LASIX ) 20 MG tablet 30 tablet     Sig: Take 1 tablet (20 mg total) by mouth as needed for  weight gain of 3 lbs in 24 hours or 5 lbs in a week     Cardiovascular:  Diuretics - Loop Failed - 01/31/2024  1:33 PM      Failed - Cr in normal range and within 180 days    Creatinine, Ser  Date Value Ref Range Status  11/27/2023 1.44 (H) 0.61 - 1.24 mg/dL Final         Failed - Mg Level in normal range and within 180 days    Magnesium   Date Value Ref Range Status  03/02/2022 1.8 1.7 - 2.4 mg/dL Final    Comment:    Performed at [facility], [address] [place], [place] [address]         Failed - Valid encounter within last 6 months    Recent Outpatient Visits           1 year ago Bilateral swelling of feet   [place] [facility] Celestia Rosaline SQUIBB, NP   1 year ago STD (male)   [place] [facility] Celestia Rosaline SQUIBB, NP   1 year ago Pain of right side of body   [place] [facility] Celestia Rosaline SQUIBB, NP   1 year ago Healthcare maintenance   [place] [facility] Celestia Rosaline SQUIBB, NP   2 years ago Prediabetes   [place] [facility] Celestia Rosaline SQUIBB, NP              Passed - K in normal range and within 180 days    Potassium  Date Value Ref Range Status  11/27/2023 3.6 3.5 - 5.1 mmol/L Final         Passed - Ca in normal range and within 180 days    Calcium   Date Value Ref Range Status  11/27/2023 9.2 8.9 - 10.3 mg/dL Final   Calcium , Ion  Date Value Ref Range Status  03/12/2021 0.96 (L) 1.15 - 1.40 mmol/L Final         Passed - Na in normal range and within 180 days    Sodium  Date Value Ref Range Status  11/27/2023 136 135 - 145 mmol/L Final  04/23/2021 140 134 - 144 mmol/L Final         Passed - Cl in normal range and within 180 days    Chloride  Date Value Ref Range Status  11/27/2023 102 98 - 111 mmol/L Final         Passed - Last BP in normal range    BP Readings from Last 1 Encounters:  11/28/23 135/76          nitroGLYCERIN   (NITROSTAT ) 0.4 MG SL tablet 25 tablet  Sig: Place 1 tablet (0.4 mg total) under the tongue every 5 (five) minutes as needed for chest pain.     Cardiovascular:  Nitrates Failed - 01/31/2024  1:33 PM      Failed - Valid encounter within last 12 months    Recent Outpatient Visits           1 year ago Bilateral swelling of feet   [place] [facility] Celestia Rosaline SQUIBB, NP   1 year ago STD (male)   [place] [facility] Celestia Rosaline SQUIBB, NP   1 year ago Pain of right side of body   [place] [facility] Celestia Rosaline SQUIBB, NP   1 year ago Healthcare maintenance   [place] [facility] Celestia Rosaline SQUIBB, NP   2 years ago Prediabetes   [place] [facility] Celestia Rosaline SQUIBB, NP              Passed - Last BP in normal range    BP Readings from Last 1 Encounters:  11/28/23 135/76         Passed - Last Heart Rate in normal range    Pulse Readings from Last 1 Encounters:  11/28/23 (!) 56          sacubitril -valsartan  (ENTRESTO ) 49-51 MG 60 tablet     Sig: Take 1 tablet by mouth 2 (two) times daily.     Off-Protocol Failed - 01/31/2024  1:33 PM      Failed - Medication not assigned to a protocol, review manually.      Failed - Valid encounter within last 12 months    Recent Outpatient Visits           1 year ago Bilateral swelling of feet   [place] [facility] Celestia Rosaline SQUIBB, NP   1 year ago STD (male)   [place] [facility] Celestia Rosaline SQUIBB, NP   1 year ago Pain of right side of body   [place] [facility] Celestia Rosaline SQUIBB, NP   1 year ago Healthcare maintenance   [place] [facility] Celestia Rosaline SQUIBB, NP   2 years ago Prediabetes   [place] [facility] Celestia Rosaline SQUIBB, NP               spironolactone  (ALDACTONE ) 25 MG tablet 90 tablet     Sig:  Take 1 tablet (25 mg total) by mouth daily.     Cardiovascular: Diuretics - Aldosterone Antagonist Failed - 01/31/2024  1:33 PM      Failed - Cr in normal range and within 180 days    Creatinine, Ser  Date Value Ref Range Status  11/27/2023 1.44 (H) 0.61 - 1.24 mg/dL Final         Failed - Valid encounter within last 6 months    Recent Outpatient Visits           1 year ago Bilateral swelling of feet   [place] [facility] Celestia Rosaline SQUIBB, NP   1 year ago STD (male)   [place] [facility] Celestia Rosaline SQUIBB, NP   1 year ago Pain of right side of body   [place] [facility] Celestia Rosaline SQUIBB, NP   1 year ago Healthcare maintenance    [facility] Celestia Rosaline SQUIBB, NP   2 years ago Prediabetes    [facility] [place],  Rosaline SQUIBB, NP              Passed - K in normal range and within 180 days    Potassium  Date Value Ref Range Status  11/27/2023 3.6 3.5 - 5.1 mmol/L Final         Passed - Na in normal range and within 180 days    Sodium  Date Value Ref Range Status  11/27/2023 136 135 - 145 mmol/L Final  04/23/2021 140 134 - 144 mmol/L Final         Passed - eGFR is 30 or above and within 180 days    GFR calc Af Amer  Date Value Ref Range Status  11/16/2019 >60 >60 mL/min Final   GFR, Estimated  Date Value Ref Range Status  11/27/2023 54 (L) >60 mL/min Final    Comment:    (NOTE) Calculated using the CKD-EPI Creatinine Equation (2021)    eGFR  Date Value Ref Range Status  04/23/2021 48 (L) >59 mL/min/1.73 Final   EGFR  Date Value Ref Range Status  01/13/2023 56.0  Final    Comment:    Abstracted by HIM         Passed - Last BP in normal range    BP Readings from Last 1 Encounters:  11/28/23 135/76          ticagrelor  (BRILINTA ) 60 MG TABS tablet 60 tablet     Sig: Take 1 tablet (60 mg total) by mouth 2 (two) times daily.      Hematology: Antiplatelets - ticagrelor  Failed - 01/31/2024  1:33 PM      Failed - Cr in normal range and within 180 days    Creatinine, Ser  Date Value Ref Range Status  11/27/2023 1.44 (H) 0.61 - 1.24 mg/dL Final         Failed - Valid encounter within last 6 months    Recent Outpatient Visits           1 year ago Bilateral swelling of feet   [place] [facility] Celestia Rosaline SQUIBB, NP   1 year ago STD (male)   [place] [facility] Celestia Rosaline SQUIBB, NP   1 year ago Pain of right side of body   [place] [facility] Celestia Rosaline SQUIBB, NP   1 year ago Healthcare maintenance   [place] [facility] Celestia Rosaline SQUIBB, NP   2 years ago Prediabetes   [place] [facility] Celestia Rosaline SQUIBB, NP              Passed - HCT in normal range and within 180 days    HCT  Date Value Ref Range Status  11/27/2023 48.8 39.0 - 52.0 % Final         Passed - HGB in normal range and within 180 days    Hemoglobin  Date Value Ref Range Status  11/27/2023 16.5 13.0 - 17.0 g/dL Final   Total hemoglobin  Date Value Ref Range Status  03/19/2021 12.5 12.0 - 16.0 g/dL Final         Passed - PLT in normal range and within 180 days    Platelets  Date Value Ref Range Status  11/27/2023 272 150 - 400 K/uL Final         Passed - Last Heart Rate in normal range    Pulse Readings from Last 1 Encounters:  11/28/23 (!) 56

## 2024-01-31 NOTE — Telephone Encounter (Signed)
 All meds prescribed at CHF clinic (except for Ibuprofen )

## 2024-02-02 ENCOUNTER — Other Ambulatory Visit (HOSPITAL_COMMUNITY): Payer: Self-pay | Admitting: Family Medicine

## 2024-02-02 ENCOUNTER — Other Ambulatory Visit (HOSPITAL_COMMUNITY): Payer: Self-pay

## 2024-02-02 ENCOUNTER — Other Ambulatory Visit (HOSPITAL_COMMUNITY): Payer: Self-pay | Admitting: Internal Medicine

## 2024-02-02 DIAGNOSIS — I151 Hypertension secondary to other renal disorders: Secondary | ICD-10-CM

## 2024-02-02 MED ORDER — CARVEDILOL 6.25 MG PO TABS
6.2500 mg | ORAL_TABLET | Freq: Two times a day (BID) | ORAL | 1 refills | Status: DC
  Filled 2024-02-02 – 2024-04-09 (×2): qty 60, 30d supply, fill #0
  Filled 2024-05-16 (×2): qty 60, 30d supply, fill #1

## 2024-02-02 MED ORDER — TICAGRELOR 60 MG PO TABS
60.0000 mg | ORAL_TABLET | Freq: Two times a day (BID) | ORAL | 12 refills | Status: AC
  Filled 2024-02-02: qty 60, 30d supply, fill #0

## 2024-02-13 ENCOUNTER — Other Ambulatory Visit (HOSPITAL_COMMUNITY): Payer: Self-pay

## 2024-02-13 ENCOUNTER — Telehealth (HOSPITAL_COMMUNITY): Payer: Self-pay

## 2024-02-13 NOTE — Telephone Encounter (Signed)
 Called to confirm/remind patient of their appointment at the [facility] on 02/14/24.   Appointment:   [x] Confirmed  [] Left mess   [] No answer/No voice mail  [] VM Full/unable to leave message  [] Phone not in service  Patient reminded to bring all medications and/or complete list.  Confirmed patient has transportation. Gave directions, instructed to utilize valet parking.

## 2024-02-14 ENCOUNTER — Encounter (HOSPITAL_COMMUNITY): Payer: Self-pay

## 2024-02-14 ENCOUNTER — Ambulatory Visit (INDEPENDENT_AMBULATORY_CARE_PROVIDER_SITE_OTHER): Payer: Self-pay | Admitting: Primary Care

## 2024-02-14 ENCOUNTER — Other Ambulatory Visit (HOSPITAL_COMMUNITY): Payer: Self-pay

## 2024-02-14 ENCOUNTER — Ambulatory Visit (HOSPITAL_COMMUNITY)
Admission: RE | Admit: 2024-02-14 | Discharge: 2024-02-14 | Disposition: A | Discharge status: Home / Self Care | Payer: Self-pay | Source: Ambulatory Visit | Attending: Family Medicine | Admitting: Family Medicine

## 2024-02-14 VITALS — BP 132/80 | HR 93 | Ht 66.0 in | Wt 176.6 lb

## 2024-02-14 DIAGNOSIS — Z139 Encounter for screening, unspecified: Secondary | ICD-10-CM

## 2024-02-14 DIAGNOSIS — I252 Old myocardial infarction: Secondary | ICD-10-CM | POA: Insufficient documentation

## 2024-02-14 DIAGNOSIS — Z8679 Personal history of other diseases of the circulatory system: Secondary | ICD-10-CM

## 2024-02-14 DIAGNOSIS — I10 Essential (primary) hypertension: Secondary | ICD-10-CM

## 2024-02-14 DIAGNOSIS — I2510 Atherosclerotic heart disease of native coronary artery without angina pectoris: Secondary | ICD-10-CM | POA: Insufficient documentation

## 2024-02-14 DIAGNOSIS — Z8674 Personal history of sudden cardiac arrest: Secondary | ICD-10-CM | POA: Insufficient documentation

## 2024-02-14 DIAGNOSIS — Z720 Tobacco use: Secondary | ICD-10-CM

## 2024-02-14 DIAGNOSIS — I25119 Atherosclerotic heart disease of native coronary artery with unspecified angina pectoris: Secondary | ICD-10-CM

## 2024-02-14 DIAGNOSIS — Z79899 Other long term (current) drug therapy: Secondary | ICD-10-CM | POA: Insufficient documentation

## 2024-02-14 DIAGNOSIS — Z5941 Food insecurity: Secondary | ICD-10-CM | POA: Insufficient documentation

## 2024-02-14 DIAGNOSIS — Z87891 Personal history of nicotine dependence: Secondary | ICD-10-CM | POA: Insufficient documentation

## 2024-02-14 DIAGNOSIS — I110 Hypertensive heart disease with heart failure: Secondary | ICD-10-CM | POA: Insufficient documentation

## 2024-02-14 DIAGNOSIS — Z955 Presence of coronary angioplasty implant and graft: Secondary | ICD-10-CM | POA: Insufficient documentation

## 2024-02-14 DIAGNOSIS — I5022 Chronic systolic (congestive) heart failure: Secondary | ICD-10-CM | POA: Insufficient documentation

## 2024-02-14 DIAGNOSIS — Z760 Encounter for issue of repeat prescription: Secondary | ICD-10-CM | POA: Insufficient documentation

## 2024-02-14 DIAGNOSIS — Z5971 Insufficient health insurance coverage: Secondary | ICD-10-CM | POA: Insufficient documentation

## 2024-02-14 LAB — BRAIN NATRIURETIC PEPTIDE: B Natriuretic Peptide: 128.5 pg/mL — ABNORMAL HIGH (ref 0.0–100.0)

## 2024-02-14 LAB — BASIC METABOLIC PANEL WITH GFR
Anion gap: 7 (ref 5–15)
BUN: 13 mg/dL (ref 8–23)
CO2: 26 mmol/L (ref 22–32)
Calcium: 9.2 mg/dL (ref 8.9–10.3)
Chloride: 107 mmol/L (ref 98–111)
Creatinine, Ser: 1.34 mg/dL — ABNORMAL HIGH (ref 0.61–1.24)
GFR, Estimated: 58 mL/min — ABNORMAL LOW (ref >60)
Glucose, Bld: 106 mg/dL — ABNORMAL HIGH (ref 70–99)
Potassium: 3.7 mmol/L (ref 3.5–5.1)
Sodium: 140 mmol/L (ref 135–145)

## 2024-02-14 MED ORDER — DAPAGLIFLOZIN PROPANEDIOL 10 MG PO TABS
10.0000 mg | ORAL_TABLET | Freq: Every day | ORAL | 11 refills | Status: AC
  Filled 2024-02-14: qty 30, 30d supply, fill #0
  Filled 2024-06-18: qty 30, 30d supply, fill #1

## 2024-02-14 MED ORDER — SACUBITRIL-VALSARTAN 24-26 MG PO TABS
1.0000 | ORAL_TABLET | Freq: Two times a day (BID) | ORAL | 11 refills | Status: DC
  Filled 2024-02-14: qty 60, 30d supply, fill #0

## 2024-02-14 NOTE — Addendum Note (Signed)
 Encounter addended by: Gerome Annabella CROME, RN on: 02/14/2024 4:19 PM  Actions taken: Pharmacy for encounter modified, Order list changed, Diagnosis association updated, Flowsheet accepted, Clinical Note Signed, Charge Capture section accepted

## 2024-02-14 NOTE — Progress Notes (Signed)
 [facility] CLIN[facility] NOTE   Primary Care: Celestia Rosaline SQUIBB, NP HF Cardiologist: Dr. Cherrie  HPI: Logan Brewer is a 68 y.o. male with history of uncontrolled HTN and heavy, longstanding tobacco use, systolic heart failure, CAD, and VT/VF arrest.   Had VT/VF arrest in 9/22. ECG with anterior STEMI. Cath 99% ostial to proximal LAD treated with PCI/DES. Echo EF 40%.   Readmitted 10/22 with CP and SOB. Repeat echo EF 40-45%, Hstrop negative. Found to have AKI and GDMT held at discharge.   He no showed and cancelled post hospital follow up x 3.  Echo 04/28/23: EF 45-50%, GIDD, LV with RWMA, RV low normal  Today he returns for HF follow up. We have not seen him since 2/25. Off all GDMT except Coreg , needs refills. Feels like he is getting a cold & legs are cramping. He has SOB walking further distances on flat ground. Occasional CP when he runs out of meds. Denies palpitations, abnormal bleeding, dizziness, edema, or PND/Orthopnea. Appetite ok. Weight at home stable.  Works in [employer]. Renting a room. Smokes THC, no tobacco or ETOH. Uninsured.   Cardiac studies: - Echo 04/28/23: EF 45-50%, GIDD, LV with RWMA, RV low normal  - Echo 3/23 EF 40%  - CPX 4/23  with submax test.  FVC 2.27 (66%)      FEV1 1.64 (60%)        FEV1/FVC 72 (92%)        Resting HR: 83 Peak HR: 118   (75% age predicted max HR)  BP rest: 126/64 BP peak: 206/68  Peak VO2: 16.8 (67% predicted peak VO2)  VE/VCO2 slope:  42  Peak RER: 0.86  PETCO2 at peak:  26   - Ltd Echo (10/22): EF 40-45%, moderate LV dysfunction, mild LVH, RV ok  - Echo 03/12/21 EF 20-25%, akinesis of anteroseptal wall and apex, RV okay, no MR, IVC normal in size  - R/LHC 9/22:  severe single vessel CAD, 95-99% ostial LAD s/p PCI + DES. EF 20-25%. RV normal. No MR, moderate pulm hypertension   - Echo 11/24 EF 45-50%, LV with RWMA, GIDD, RV low normal, no MR .  Past Medical History:  Diagnosis Date   Hydrocele in adult     Hypertension    Inguinal hernia    Ischemic cardiomyopathy    Smoker 03/16/2021   smokes 1/2 ppd PTA per hx   STEMI involving left anterior descending coronary artery (HCC) 03/12/2021   Current Outpatient Medications  Medication Sig Dispense Refill   aspirin  EC 81 MG tablet Take 81 mg by mouth daily. Swallow whole.     carvedilol  (COREG ) 6.25 MG tablet Take 1 tablet (6.25 mg total) by mouth 2 (two) times daily with a meal. 60 tablet 1   ticagrelor  (BRILINTA ) 60 MG TABS tablet Take 1 tablet (60 mg total) by mouth 2 (two) times daily. 60 tablet 12   dapagliflozin  propanediol (FARXIGA ) 10 MG TABS tablet Take 1 tablet (10 mg total) by mouth daily before breakfast. (Patient not taking: Reported on 02/14/2024) 30 tablet 3   furosemide  (LASIX ) 20 MG tablet Take 1 tablet (20 mg total) by mouth as needed for weight gain of 3 lbs in 24 hours or 5 lbs in a week (Patient not taking: Reported on 02/14/2024) 30 tablet 11   ibuprofen  (ADVIL ) 600 MG tablet Take 1 tablet (600 mg total) by mouth every 8 (eight) hours as needed. (Patient not taking: Reported on 02/14/2024) 30 tablet 0   nitroGLYCERIN  (  NITROSTAT ) 0.4 MG SL tablet Place 1 tablet (0.4 mg total) under the tongue every 5 (five) minutes as needed for chest pain. (Patient not taking: Reported on 02/14/2024) 25 tablet 1   sacubitril -valsartan  (ENTRESTO ) 49-51 MG Take 1 tablet by mouth 2 (two) times daily. (Patient not taking: Reported on 02/14/2024) 60 tablet 6   spironolactone  (ALDACTONE ) 25 MG tablet Take 1 tablet (25 mg total) by mouth daily. (Patient not taking: Reported on 02/14/2024) 90 tablet 3   No current facility-administered medications for this encounter.   No Known Allergies  Social History   Socioeconomic History   Marital status: Single    Spouse name: Not on file   Number of children: Not on file   Years of education: Not on file   Highest education level: Not on file  Occupational History   Not on file  Tobacco Use   Smoking  status: Former    Current packs/day: 0.50    Types: Cigarettes   Smokeless tobacco: Never  Vaping Use   Vaping status: Never Used  Substance and Sexual Activity   Alcohol use: No   Drug use: Yes    Types: Marijuana    Comment: occasionally    Sexual activity: Not on file  Other Topics Concern   Not on file  Social History Narrative   ** Merged History Encounter **       Social Drivers of Health   Financial Resource Strain: Low Risk  (09/17/2021)   Overall Financial Resource Strain (CARDIA)    Difficulty of Paying Living Expenses: Not very hard  Food Insecurity: No Food Insecurity (09/17/2021)   Hunger Vital Sign    Worried About Running Out of Food in the Last Year: Never true    Ran Out of Food in the Last Year: Never true  Transportation Needs: No Transportation Needs (09/17/2021)   PRAPARE - Administrator, Civil Service (Medical): No    Lack of Transportation (Non-Medical): No  Physical Activity: Not on file  Stress: Not on file  Social Connections: Not on file  Intimate Partner Violence: Not on file   Family History  Problem Relation Age of Onset   Heart disease Neg Hx    BP 132/80   Pulse 93   Ht 5' 6 (1.676 m)   Wt 80.1 kg (176 lb 9.6 oz)   SpO2 98%   BMI 28.50 kg/m   Wt Readings from Last 3 Encounters:  02/14/24 80.1 kg (176 lb 9.6 oz)  11/27/23 79.8 kg (175 lb 14.8 oz)  08/16/23 79.8 kg (176 lb)   PHYSICAL EXAM: General:  NAD. No resp difficulty, walked into clinic HEENT: Normal Neck: Supple. No JVD. Cor: Regular rate & rhythm. No rubs, gallops or murmurs. Lungs: Clear Abdomen: Soft, nontender, nondistended.  Extremities: No cyanosis, clubbing, rash, edema Neuro: Alert & oriented x 3, moves all 4 extremities w/o difficulty. Affect pleasant.  ReDs reading: 35%, abnormal  ECG (personally reviewed): NSR 91 bpm  1. Chronic Systolic Heart Failure - Secondary to anterior ST elevation MI/OOH arrest in 9/22 - Initial echo EF 20-25% with  akinesis of anteroseptal wall and apex - Bedside echo EF 35-40% 03/14/21.  - Limited echo 03/16/21 w/ improved EF, 40-45%   - Echo 09/17/21 LVEF 40%.  - CPX 4/23  with submax test . FVC 2.27 (66%) FEV1 1.64 (60%)  pVO2: 16.8 (67%) VE/VCO2 42 pRER: 0.86  PETCO2 26  - PFTs suggest significant COPD - Echo 11/24 EF 45-50%, LV  with RWMA, GIDD, RV low normal, no MR - NYHA II-early III, volume OK, ReDs 35% - Restart Entresto  24/26 mg bid - Restart Farxiga  10 mg daily  - Continue Coreg  6.25 mg bid - Add spiro back next - Labs today - Update echo after back on GDMT  2. CAD - s/p PCI/DES to LAD as above - No s/s angina - Can stop ASA, per Dr. Genice previous recs - Continue Brilinta , reduced dose to 60 mg bid. Discussed with Dr. Rolan - Continue ? blocker. - Needs a statin.   3. H/o VT/VF arrest - 2/2 #2 - Off amiodarone  - Echo 04/28/23: EF 45-50%, GIDD, LV with RWMA, RV low normal. - Does not qualify for ICD. No change  4. HTN - BP up a bit, out of meds - Restart meds as above   5. Tobacco use - Quit completely - PFTs suggest significant COPD - discussed THC cessation  6. SDOH - Uninsured, has transportation needs and food insecurity. - Engage HFSW for resources  Follow up in 2 weeks with PharmD (will need a BMET) and 4 weeks with APP for GDMT titration.  Harlene Gainer, FNP-BC 02/14/24

## 2024-02-14 NOTE — Patient Instructions (Addendum)
 Thank you for coming in today  If you had labs drawn today, any labs that are abnormal the clinic will call you No news is good news  Medications: Stop Aspirin  Restart Farxiga  10 mg 1 tablet daily Restart Entresto  24/26 1 tablet twice daily  Follow up appointments:  Your physician recommends that you schedule a follow-up appointment in:  2 weeks in clinic  4 weeks in pharmacy   Do the following things EVERYDAY: Weigh yourself in the morning before breakfast. Write it down and keep it in a log. Take your medicines as prescribed Eat low salt foods--Limit salt (sodium) to 2000 mg per day.  Stay as active as you can everyday Limit all fluids for the day to less than 2 liters   At the [facility], you and your health needs are our priority. As part of our continuing mission to provide you with exceptional heart care, we have created designated Provider Care Teams. These Care Teams include your primary Cardiologist (physician) and Advanced Practice Providers (APPs- Physician Assistants and Nurse Practitioners) who all work together to provide you with the care you need, when you need it.   You may see any of the following providers on your designated Care Team at your next follow up: Dr Toribio Fuel Dr Ezra Shuck Dr. Ria Gardenia Greig Lenetta, NP Caffie Shed, [place] [facility] [place], [place] Beckey Coe, NP Tinnie Redman, PharmD   Please be sure to bring in all your medications bottles to every appointment.    Thank you for choosing [facility]  If you have any questions or concerns before your next appointment please send us  a message through [place] or call our office at [phone].    TO LEAVE A MESSAGE FOR THE NURSE SELECT OPTION 2, PLEASE LEAVE A MESSAGE INCLUDING: YOUR NAME DATE OF BIRTH CALL BACK NUMBER REASON FOR CALL**this is important as we prioritize the call backs  YOU WILL RECEIVE A  CALL BACK THE SAME DAY AS LONG AS YOU CALL BEFORE 4:00 PM

## 2024-02-14 NOTE — Progress Notes (Signed)
 H&V Care Navigation CSW Progress Note  Clinical Social Worker consulted to speak with pt regarding lack of insurance, financial concerns, transportation concerns, and food insecurity concerns.  Pt reports he lives in boarding house and has been able to keep up with rent.  States he works 6 days a week 12 hour days but only gets $300/week.  Has been referred to apply for disability in the past as he is a legal permanent resident and has been here over 5 years but he states they have an issue with his SSN and that they need him to bring in his passport- states he lost his passport years ago and is in the works to get a new one.  Also states he attempted to apply for Medicaid but was unable to for the same reason.  Pt unable to access food pantries easily given work schedule but provided with list.  Also provided with food bag in clinic.    Borrows a car from coworkers when needed which is how he got here today.  Encouraged him to reach out to us  to discuss transportation needs if unable to come to appt.    CSW saw on chart previous attempt to apply for Medicaid- will contact [employer] workers to see if they can shed any light into eligibility based on citizenship status.  SDOH Screenings   Food Insecurity: Food Insecurity Present (02/14/2024)  Housing: Medium Risk (09/18/2021)  Transportation Needs: No Transportation Needs (09/17/2021)  Depression (PHQ2-9): Low Risk  (03/24/2022)  Financial Resource Strain: Medium Risk (02/14/2024)  Tobacco Use: Medium Risk (02/14/2024)   Logan HILARIO Leech, LCSW Clinical Social Worker [facility] Desk#: [phone] Cell#: [phone]

## 2024-02-14 NOTE — Addendum Note (Signed)
 Encounter addended by: Cathern Andriette DEL, LCSW on: 02/14/2024 4:34 PM  Actions taken: Flowsheet accepted, Clinical Note Signed

## 2024-02-15 ENCOUNTER — Ambulatory Visit (HOSPITAL_COMMUNITY): Payer: Self-pay | Admitting: Family Medicine

## 2024-02-16 ENCOUNTER — Other Ambulatory Visit (HOSPITAL_COMMUNITY): Payer: Self-pay

## 2024-02-17 ENCOUNTER — Telehealth (HOSPITAL_COMMUNITY): Payer: Self-pay | Admitting: Licensed Clinical Social Worker

## 2024-02-17 NOTE — Telephone Encounter (Signed)
 Attempted to call pt to assist in contacting [employer] to discuss next steps to see if he is eligible for disability but unable to reach  Spoke with [employer] worker who states previous attempts to apply have halted due to inability to provide proof of immigration status.  CSW will attempt to help pt take next steps to do this.  Andriette HILARIO Leech, LCSW Clinical Social Worker [facility] Desk#: [phone] Cell#: [phone]

## 2024-02-23 ENCOUNTER — Other Ambulatory Visit (HOSPITAL_COMMUNITY): Payer: Self-pay

## 2024-02-27 NOTE — Progress Notes (Signed)
 ADVANCED HF CLINIC NOTE   Primary Care: Celestia Rosaline SQUIBB, NP HF Cardiologist: Dr. Cherrie  HPI: Logan Brewer is a 68 y.o. male with history of uncontrolled HTN and heavy, longstanding tobacco use, systolic heart failure, CAD, and VT/VF arrest.   Had VT/VF arrest in 9/22. ECG with anterior STEMI. Cath 99% ostial to proximal LAD treated with PCI/DES. Echo EF 40%.   Readmitted 10/22 with CP and SOB. Repeat echo EF 40-45%, Hstrop negative. Found to have AKI and GDMT held at discharge.   He no showed and cancelled post hospital follow up x 3.  Echo 04/28/23: EF 45-50%, GIDD, LV with RWMA, RV low normal  Today he returns for HF follow up. We have not seen him since 2/25. Off all GDMT except Coreg , needs refills. Feels like he is getting a cold & legs are cramping. He has SOB walking further distances on flat ground. Occasional CP when he runs out of meds. Denies palpitations, abnormal bleeding, dizziness, edema, or PND/Orthopnea. Appetite ok. Weight at home stable.  Works in [employer]. Renting a room. Smokes THC, no tobacco or ETOH. Uninsured.   Cardiac studies: - Echo 04/28/23: EF 45-50%, GIDD, LV with RWMA, RV low normal  - Echo 3/23 EF 40%  - CPX 4/23  with submax test.  FVC 2.27 (66%)      FEV1 1.64 (60%)        FEV1/FVC 72 (92%)        Resting HR: 83 Peak HR: 118   (75% age predicted max HR)  BP rest: 126/64 BP peak: 206/68  Peak VO2: 16.8 (67% predicted peak VO2)  VE/VCO2 slope:  42  Peak RER: 0.86  PETCO2 at peak:  26   - Ltd Echo (10/22): EF 40-45%, moderate LV dysfunction, mild LVH, RV ok  - Echo 03/12/21 EF 20-25%, akinesis of anteroseptal wall and apex, RV okay, no MR, IVC normal in size  - R/LHC 9/22:  severe single vessel CAD, 95-99% ostial LAD s/p PCI + DES. EF 20-25%. RV normal. No MR, moderate pulm hypertension   - Echo 11/24 EF 45-50%, LV with RWMA, GIDD, RV low normal, no MR .  Past Medical History:  Diagnosis Date   Hydrocele in adult     Hypertension    Inguinal hernia    Ischemic cardiomyopathy    Smoker 03/16/2021   smokes 1/2 ppd PTA per hx   STEMI involving left anterior descending coronary artery (HCC) 03/12/2021   Current Outpatient Medications  Medication Sig Dispense Refill   carvedilol  (COREG ) 6.25 MG tablet Take 1 tablet (6.25 mg total) by mouth 2 (two) times daily with a meal. 60 tablet 1   dapagliflozin  propanediol (FARXIGA ) 10 MG TABS tablet Take 1 tablet (10 mg total) by mouth daily before breakfast. 30 tablet 11   furosemide  (LASIX ) 20 MG tablet Take 1 tablet (20 mg total) by mouth as needed for weight gain of 3 lbs in 24 hours or 5 lbs in a week (Patient not taking: Reported on 02/14/2024) 30 tablet 11   ibuprofen  (ADVIL ) 600 MG tablet Take 1 tablet (600 mg total) by mouth every 8 (eight) hours as needed. (Patient not taking: Reported on 02/14/2024) 30 tablet 0   nitroGLYCERIN  (NITROSTAT ) 0.4 MG SL tablet Place 1 tablet (0.4 mg total) under the tongue every 5 (five) minutes as needed for chest pain. (Patient not taking: Reported on 02/14/2024) 25 tablet 1   sacubitril -valsartan  (ENTRESTO ) 24-26 MG Take 1 tablet by mouth 2 (two) times  daily. 60 tablet 11   spironolactone  (ALDACTONE ) 25 MG tablet Take 1 tablet (25 mg total) by mouth daily. (Patient not taking: Reported on 02/14/2024) 90 tablet 3   ticagrelor  (BRILINTA ) 60 MG TABS tablet Take 1 tablet (60 mg total) by mouth 2 (two) times daily. 60 tablet 12   No current facility-administered medications for this visit.   No Known Allergies  Social History   Socioeconomic History   Marital status: Single    Spouse name: Not on file   Number of children: Not on file   Years of education: Not on file   Highest education level: Not on file  Occupational History   Not on file  Tobacco Use   Smoking status: Former    Current packs/day: 0.50    Types: Cigarettes   Smokeless tobacco: Never  Vaping Use   Vaping status: Never Used  Substance and Sexual Activity    Alcohol use: No   Drug use: Yes    Types: Marijuana    Comment: occasionally    Sexual activity: Not on file  Other Topics Concern   Not on file  Social History Narrative   ** Merged History Encounter **       Social Drivers of Health   Financial Resource Strain: Medium Risk (02/14/2024)   Overall Financial Resource Strain (CARDIA)    Difficulty of Paying Living Expenses: Somewhat hard  Food Insecurity: Food Insecurity Present (02/14/2024)   Hunger Vital Sign    Worried About Running Out of Food in the Last Year: Sometimes true    Ran Out of Food in the Last Year: Never true  Transportation Needs: No Transportation Needs (09/17/2021)   PRAPARE - Administrator, Civil Service (Medical): No    Lack of Transportation (Non-Medical): No  Physical Activity: Not on file  Stress: Not on file  Social Connections: Not on file  Intimate Partner Violence: Not on file   Family History  Problem Relation Age of Onset   Heart disease Neg Hx    There were no vitals taken for this visit.  Wt Readings from Last 3 Encounters:  02/14/24 80.1 kg (176 lb 9.6 oz)  11/27/23 79.8 kg (175 lb 14.8 oz)  08/16/23 79.8 kg (176 lb)   PHYSICAL EXAM: General:  NAD. No resp difficulty, walked into clinic HEENT: Normal Neck: Supple. No JVD. Cor: Regular rate & rhythm. No rubs, gallops or murmurs. Lungs: Clear Abdomen: Soft, nontender, nondistended.  Extremities: No cyanosis, clubbing, rash, edema Neuro: Alert & oriented x 3, moves all 4 extremities w/o difficulty. Affect pleasant.  ReDs reading: 35%, abnormal  ECG (personally reviewed): NSR 91 bpm  1. Chronic Systolic Heart Failure - Secondary to anterior ST elevation MI/OOH arrest in 9/22 - Initial echo EF 20-25% with akinesis of anteroseptal wall and apex - Bedside echo EF 35-40% 03/14/21.  - Limited echo 03/16/21 w/ improved EF, 40-45%   - Echo 09/17/21 LVEF 40%.  - CPX 4/23  with submax test . FVC 2.27 (66%) FEV1 1.64 (60%)  pVO2:  16.8 (67%) VE/VCO2 42 pRER: 0.86  PETCO2 26  - PFTs suggest significant COPD - Echo 11/24 EF 45-50%, LV with RWMA, GIDD, RV low normal, no MR - NYHA II-early III, volume OK, ReDs 35% - Restart Entresto  24/26 mg bid - Restart Farxiga  10 mg daily  - Continue Coreg  6.25 mg bid - Add spiro back next - Labs today - Update echo after back on GDMT  2. CAD - s/p  PCI/DES to LAD as above - No s/s angina - Can stop ASA, per Dr. Genice previous recs - Continue Brilinta , reduced dose to 60 mg bid. Discussed with Dr. Rolan - Continue ? blocker. - Needs a statin.   3. H/o VT/VF arrest - 2/2 #2 - Off amiodarone  - Echo 04/28/23: EF 45-50%, GIDD, LV with RWMA, RV low normal. - Does not qualify for ICD. No change  4. HTN - BP up a bit, out of meds - Restart meds as above   5. Tobacco use - Quit completely - PFTs suggest significant COPD - discussed THC cessation  6. SDOH - Uninsured, has transportation needs and food insecurity. - Engage HFSW for resources  Follow up in 2 weeks with PharmD (will need a BMET) and 4 weeks with APP for GDMT titration.  Harlene Gainer, FNP-BC 02/27/24

## 2024-02-28 ENCOUNTER — Telehealth (HOSPITAL_COMMUNITY): Payer: Self-pay

## 2024-02-28 NOTE — Telephone Encounter (Signed)
 Called to confirm/remind patient of their appointment at the [facility] on 02/29/24.   Appointment:   [x] Confirmed  [] Left mess   [] No answer/No voice mail  [] VM Full/unable to leave message  [] Phone not in service  Patient reminded to bring all medications and/or complete list.  Confirmed patient has transportation. Gave directions, instructed to utilize valet parking.

## 2024-02-29 ENCOUNTER — Encounter (HOSPITAL_COMMUNITY): Payer: Self-pay

## 2024-02-29 ENCOUNTER — Ambulatory Visit (HOSPITAL_COMMUNITY)
Admission: RE | Admit: 2024-02-29 | Discharge: 2024-02-29 | Disposition: A | Discharge status: Home / Self Care | Payer: Self-pay | Source: Ambulatory Visit | Attending: Family Medicine | Admitting: Family Medicine

## 2024-02-29 ENCOUNTER — Other Ambulatory Visit (HOSPITAL_COMMUNITY): Payer: Self-pay

## 2024-02-29 VITALS — BP 124/72 | HR 74 | Ht 66.0 in | Wt 174.0 lb

## 2024-02-29 DIAGNOSIS — Z7902 Long term (current) use of antithrombotics/antiplatelets: Secondary | ICD-10-CM | POA: Insufficient documentation

## 2024-02-29 DIAGNOSIS — Z5986 Financial insecurity: Secondary | ICD-10-CM | POA: Insufficient documentation

## 2024-02-29 DIAGNOSIS — Z79899 Other long term (current) drug therapy: Secondary | ICD-10-CM | POA: Insufficient documentation

## 2024-02-29 DIAGNOSIS — Z720 Tobacco use: Secondary | ICD-10-CM

## 2024-02-29 DIAGNOSIS — I10 Essential (primary) hypertension: Secondary | ICD-10-CM

## 2024-02-29 DIAGNOSIS — Z7984 Long term (current) use of oral hypoglycemic drugs: Secondary | ICD-10-CM | POA: Insufficient documentation

## 2024-02-29 DIAGNOSIS — I5022 Chronic systolic (congestive) heart failure: Secondary | ICD-10-CM | POA: Insufficient documentation

## 2024-02-29 DIAGNOSIS — Z955 Presence of coronary angioplasty implant and graft: Secondary | ICD-10-CM | POA: Insufficient documentation

## 2024-02-29 DIAGNOSIS — I2510 Atherosclerotic heart disease of native coronary artery without angina pectoris: Secondary | ICD-10-CM | POA: Insufficient documentation

## 2024-02-29 DIAGNOSIS — I110 Hypertensive heart disease with heart failure: Secondary | ICD-10-CM | POA: Insufficient documentation

## 2024-02-29 DIAGNOSIS — Z139 Encounter for screening, unspecified: Secondary | ICD-10-CM

## 2024-02-29 DIAGNOSIS — Z8679 Personal history of other diseases of the circulatory system: Secondary | ICD-10-CM

## 2024-02-29 DIAGNOSIS — Z8674 Personal history of sudden cardiac arrest: Secondary | ICD-10-CM | POA: Insufficient documentation

## 2024-02-29 DIAGNOSIS — I25119 Atherosclerotic heart disease of native coronary artery with unspecified angina pectoris: Secondary | ICD-10-CM

## 2024-02-29 DIAGNOSIS — Z5971 Insufficient health insurance coverage: Secondary | ICD-10-CM | POA: Insufficient documentation

## 2024-02-29 DIAGNOSIS — Z87891 Personal history of nicotine dependence: Secondary | ICD-10-CM | POA: Insufficient documentation

## 2024-02-29 DIAGNOSIS — I252 Old myocardial infarction: Secondary | ICD-10-CM | POA: Insufficient documentation

## 2024-02-29 MED ORDER — NITROGLYCERIN 0.4 MG SL SUBL
0.4000 mg | SUBLINGUAL_TABLET | SUBLINGUAL | 1 refills | Status: AC | PRN
  Filled 2024-02-29: qty 25, 8d supply, fill #0
  Filled 2024-06-18: qty 25, 8d supply, fill #1

## 2024-02-29 MED ORDER — SPIRONOLACTONE 25 MG PO TABS
25.0000 mg | ORAL_TABLET | Freq: Every day | ORAL | 3 refills | Status: AC
  Filled 2024-02-29: qty 30, 30d supply, fill #0
  Filled 2024-04-09: qty 30, 30d supply, fill #1
  Filled 2024-05-16 (×2): qty 30, 30d supply, fill #2
  Filled 2024-06-18 (×2): qty 30, 30d supply, fill #3
  Filled 2024-07-26: qty 30, 30d supply, fill #4

## 2024-02-29 MED ORDER — ATORVASTATIN CALCIUM 40 MG PO TABS
40.0000 mg | ORAL_TABLET | Freq: Every day | ORAL | 3 refills | Status: AC
  Filled 2024-02-29: qty 30, 30d supply, fill #0
  Filled 2024-04-09: qty 30, 30d supply, fill #1
  Filled 2024-05-16 (×2): qty 30, 30d supply, fill #2
  Filled 2024-06-18 (×2): qty 30, 30d supply, fill #3
  Filled 2024-07-26: qty 30, 30d supply, fill #4

## 2024-02-29 MED ORDER — LOSARTAN POTASSIUM 25 MG PO TABS
25.0000 mg | ORAL_TABLET | Freq: Every day | ORAL | 11 refills | Status: AC
  Filled 2024-02-29: qty 30, 30d supply, fill #0
  Filled 2024-04-09: qty 30, 30d supply, fill #1
  Filled 2024-05-16 (×2): qty 30, 30d supply, fill #2
  Filled 2024-06-18 (×2): qty 30, 30d supply, fill #3
  Filled 2024-07-26: qty 30, 30d supply, fill #4

## 2024-02-29 NOTE — Addendum Note (Signed)
 Encounter addended by: Alvan Fausto SAUNDERS, CMA on: 02/29/2024 4:17 PM  Actions taken: Child order released for a procedure order

## 2024-02-29 NOTE — Progress Notes (Signed)
 ReDS Vest / Clip - 02/29/24 1500       ReDS Vest / Clip   Station Marker A    Ruler Value 29    ReDS Value Range Low volume    ReDS Actual Value 33

## 2024-02-29 NOTE — Patient Instructions (Signed)
 Stop Entresto  Start Losartan  25 mg daily - Rx sent. Re-start Spiro 25 mg daily. Restart Atorvastatin  40 mg daily - Rx sent Refill sent for Nitroglycerin  Will get labs at your next visit - see below. Repeat labs in 6 - 8 weeks. See below. Return to see Dr. Cherrie with echo in 3 months.  PLEASE CALL [phone] IN NOVEMBER TO SCHEDULE THIS APPOINTMENT. Please call [phone] if any questions or concerns prior to your next visit.    Food bag provided at this visit.

## 2024-02-29 NOTE — Addendum Note (Signed)
 Encounter addended by: Alvan Fausto SAUNDERS, CMA on: 02/29/2024 4:22 PM  Actions taken: Order list changed, Diagnosis association updated

## 2024-03-01 ENCOUNTER — Telehealth (HOSPITAL_COMMUNITY): Payer: Self-pay | Admitting: Licensed Clinical Social Worker

## 2024-03-01 NOTE — Telephone Encounter (Signed)
 H&V Care Navigation CSW Progress Note  Clinical Social Worker called pt to check on status of obtaining new passport.  States he has applied and it is in process of being sent to him.  After he gets new passport he will plan to reach out to [facility] regarding disability as well as [employer] regarding Medicaid.  Asked that he inform me when passport arrives so I can help direct him in this process.  Andriette HILARIO Leech, LCSW Clinical Social Worker [facility] Desk#: [phone] Cell#: [phone]

## 2024-03-02 ENCOUNTER — Telehealth (HOSPITAL_COMMUNITY): Payer: Self-pay

## 2024-03-02 DIAGNOSIS — I5022 Chronic systolic (congestive) heart failure: Secondary | ICD-10-CM

## 2024-03-02 NOTE — Telephone Encounter (Signed)
 Called patient to let him know that Per Harlene Gainer, NP-- he does not need 9/23 apt with pharmD  Does need BMP next week. Patient aware and verbalized understanding, apt scheduled. Lab orders placed.

## 2024-03-06 NOTE — Telephone Encounter (Signed)
 Received shipment notification from [employer] that Brilinta , Farxiga  should be delivered to pt within 7-10 business days of 03/05/2024

## 2024-03-09 ENCOUNTER — Ambulatory Visit (HOSPITAL_COMMUNITY): Payer: Self-pay | Admitting: Family Medicine

## 2024-03-09 ENCOUNTER — Ambulatory Visit (HOSPITAL_COMMUNITY)
Admission: RE | Admit: 2024-03-09 | Discharge: 2024-03-09 | Disposition: A | Discharge status: Home / Self Care | Payer: Self-pay | Source: Ambulatory Visit | Attending: Internal Medicine | Admitting: Internal Medicine

## 2024-03-09 DIAGNOSIS — I5022 Chronic systolic (congestive) heart failure: Secondary | ICD-10-CM | POA: Insufficient documentation

## 2024-03-09 LAB — BASIC METABOLIC PANEL WITH GFR
Anion gap: 10 (ref 5–15)
BUN: 15 mg/dL (ref 8–23)
CO2: 19 mmol/L — ABNORMAL LOW (ref 22–32)
Calcium: 9.1 mg/dL (ref 8.9–10.3)
Chloride: 109 mmol/L (ref 98–111)
Creatinine, Ser: 1.45 mg/dL — ABNORMAL HIGH (ref 0.61–1.24)
GFR, Estimated: 53 mL/min — ABNORMAL LOW (ref >60)
Glucose, Bld: 67 mg/dL — ABNORMAL LOW (ref 70–99)
Potassium: 3.7 mmol/L (ref 3.5–5.1)
Sodium: 138 mmol/L (ref 135–145)

## 2024-03-13 ENCOUNTER — Other Ambulatory Visit (HOSPITAL_COMMUNITY): Payer: Self-pay

## 2024-03-16 ENCOUNTER — Ambulatory Visit (HOSPITAL_COMMUNITY)
Admission: EM | Admit: 2024-03-16 | Discharge: 2024-03-16 | Disposition: A | Discharge status: Home / Self Care | Payer: Self-pay | Attending: Physician Assistant | Admitting: Physician Assistant

## 2024-03-16 ENCOUNTER — Encounter (HOSPITAL_COMMUNITY): Payer: Self-pay

## 2024-03-16 DIAGNOSIS — R1111 Vomiting without nausea: Secondary | ICD-10-CM

## 2024-03-16 DIAGNOSIS — R519 Headache, unspecified: Secondary | ICD-10-CM

## 2024-03-16 DIAGNOSIS — R42 Dizziness and giddiness: Secondary | ICD-10-CM

## 2024-03-16 MED ORDER — ACETAMINOPHEN 325 MG PO TABS
975.0000 mg | ORAL_TABLET | Freq: Once | ORAL | Status: AC
  Administered 2024-03-16: 975 mg via ORAL

## 2024-03-16 MED ORDER — ACETAMINOPHEN 325 MG PO TABS
ORAL_TABLET | ORAL | Status: AC
  Filled 2024-03-16: qty 3

## 2024-03-16 NOTE — ED Provider Notes (Signed)
 [facility]    CSN: [id number] Arrival date & time: 03/16/24  1819      History   Chief Complaint Chief Complaint  Patient presents with   Headache   Dizziness   Emesis    HPI Logan Brewer is a 68 y.o. male.  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   HPI  Discussed the use of AI scribe software for clinical note transcription with the patient, who gave verbal consent to proceed.  The patient presents with headache and dizziness. The headache is primarily located along the forehead and is rated as a 6 out of 10 in severity. It is accompanied by dizziness and diplopia, described by the patient as seeing 'two of you guys'. He has experienced vomiting two to three times, with the vomitus appearing brown- unsure if it was similar to coffee grounds. He has not eaten or drunk anything since vomiting and has not attempted to do so. He feels cold but has no fever. He has not taken any medication for the headache and mentions a possible change in his medication regimen, although he is unsure of the specifics. He did not take his medication this morning as it was left in his boss's truck. No history of similar symptoms, recent head injury, or abdominal pain. No diarrhea, numbness, tingling, or confusion.   Patient reports that he has not been able to take his blood pressure medication today   Past Medical History:  Diagnosis Date   Hydrocele in adult    Hypertension    Inguinal hernia    Ischemic cardiomyopathy    Smoker 03/16/2021   smokes 1/2 ppd PTA per hx   STEMI involving left anterior descending coronary artery (HCC) 03/12/2021    Patient Active Problem List   Diagnosis Date Noted   Non-ST elevation (NSTEMI) myocardial infarction (HCC) 03/01/2022   Chronic systolic CHF (congestive heart failure)  (HCC) 03/01/2022   Prediabetes 03/01/2022   Mixed hyperlipidemia 04/09/2021   Nicotine  dependence, cigarettes, uncomplicated 04/09/2021   Atrial fibrillation (HCC) 03/12/2021   Metabolic acidosis    Coronary artery disease involving native coronary artery of native heart with angina pectoris    Chronic left shoulder pain 07/03/2020   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   Erectile dysfunction due to arterial insufficiency 02/26/2020   Other hydrocele 12/05/2019   Left inguinal hernia    Essential hypertension 04/26/2013   Radiculopathy, lumbosacral region 04/25/2013   Back pain 04/25/2013   Anxiety 04/25/2013    Past Surgical History:  Procedure Laterality Date   APPENDECTOMY     CORONARY STENT INTERVENTION N/A 03/11/2021   Procedure: CORONARY STENT INTERVENTION;  Surgeon: Anner Alm ORN, MD;  Location: [facility] INVASIVE CV LAB;  Service: Cardiovascular;  Laterality: N/A;   CORONARY STENT INTERVENTION N/A 03/02/2022   Procedure: CORONARY STENT INTERVENTION;  Surgeon: Anner Alm ORN, MD;  Location: [facility] INVASIVE CV LAB;  Service: Cardiovascular;  Laterality: N/A;   CORONARY/GRAFT ACUTE MI REVASCULARIZATION N/A 03/11/2021   Procedure: Coronary/Graft Acute MI Revascularization;  Surgeon: Anner Alm ORN, MD;  Location: [facility] INVASIVE CV LAB;  Service: Cardiovascular;  Laterality: N/A;   HYDROCELE EXCISION Bilateral 11/21/2019   Procedure: HYDROCELECTOMY ADULT;  Surgeon: Sherrilee Belvie CROME, MD;  Location: [facility];  Service: Urology;  Laterality: Bilateral;  [id number]   HYDROCELE EXCISION Right 04/07/2020   Procedure: RIGHT HYDROCELECTOMY;  Surgeon: Sherrilee Belvie CROME, MD;  Location: [facility];  Service: Urology;  Laterality: Right;   INGUINAL HERNIA REPAIR Left 11/21/2019   Procedure: HERNIA REPAIR INGUINAL ADULT;  Surgeon: Mavis Anes, MD;  Location: [facility];  Service: General;  Laterality: Left;  [id number]   LEFT HEART CATH AND CORONARY ANGIOGRAPHY N/A 03/02/2022   Procedure: LEFT HEART CATH AND  CORONARY ANGIOGRAPHY;  Surgeon: Anner Alm ORN, MD;  Location: [facility] INVASIVE CV LAB;  Service: Cardiovascular;  Laterality: N/A;   RIGHT/LEFT HEART CATH AND CORONARY ANGIOGRAPHY N/A 03/11/2021   Procedure: RIGHT/LEFT HEART CATH AND CORONARY ANGIOGRAPHY;  Surgeon: Anner Alm ORN, MD;  Location: [facility] INVASIVE CV LAB;  Service: Cardiovascular;  Laterality: N/A;       Home Medications    Prior to Admission medications   Medication Sig Start Date End Date Taking? Authorizing Provider  atorvastatin  (LIPITOR ) 40 MG tablet Take 1 tablet (40 mg total) by mouth daily. 02/29/24   Milford, Harlene HERO, FNP  carvedilol  (COREG ) 6.25 MG tablet Take 1 tablet (6.25 mg total) by mouth 2 (two) times daily with a meal. 02/02/24   Bensimhon, Toribio SAUNDERS, MD  dapagliflozin  propanediol (FARXIGA ) 10 MG TABS tablet Take 1 tablet (10 mg total) by mouth daily before breakfast. 02/14/24   Milford, Harlene HERO, FNP  furosemide  (LASIX ) 20 MG tablet Take 1 tablet (20 mg total) by mouth as needed for weight gain of 3 lbs in 24 hours or 5 lbs in a week 06/13/23   Hayes Beckey CROME, NP  ibuprofen  (ADVIL ) 600 MG tablet Take 1 tablet (600 mg total) by mouth every 8 (eight) hours as needed. 06/28/22   Celestia Rosaline SQUIBB, NP  losartan  (COZAAR ) 25 MG tablet Take 1 tablet (25 mg total) by mouth daily. 02/29/24   Milford, Harlene HERO, FNP  nitroGLYCERIN  (NITROSTAT ) 0.4 MG SL tablet Place 1 tablet (0.4 mg total) under the tongue every 5 (five) minutes as needed for chest pain. 02/29/24   Milford, Harlene HERO, FNP  spironolactone  (ALDACTONE ) 25 MG tablet Take 1 tablet (25 mg total) by mouth daily. 02/29/24   Milford, Harlene HERO, FNP  ticagrelor  (BRILINTA ) 60 MG TABS tablet Take 1 tablet (60 mg total) by mouth 2 (two) times daily. 02/02/24   Glena Harlene HERO, FNP    Family History Family History  Problem Relation Age of Onset   Heart disease Neg Hx     Social History Social History   Tobacco Use   Smoking status: Former    Current packs/day: 0.50     Types: Cigarettes   Smokeless tobacco: Never  Vaping Use   Vaping status: Never Used  Substance Use Topics   Alcohol use: No   Drug use: Yes    Types: Marijuana    Comment: occasionally      Allergies   Patient has no known allergies.   Review of Systems Review of Systems  Constitutional:  Negative for chills and fever.  Eyes:  Positive for visual disturbance.  Respiratory:  Negative for choking and shortness of breath.   Gastrointestinal:  Positive for vomiting. Negative for abdominal pain and nausea.  Neurological:  Positive for dizziness and headaches.     Physical Exam Triage Vital Signs ED Triage Vitals [03/16/24 1846]  Encounter Vitals Group     BP (!) 167/82     Girls Systolic BP Percentile      Girls Diastolic BP Percentile      Boys Systolic BP Percentile      Boys Diastolic BP Percentile  Pulse Rate 75     Resp 16     Temp 97.6 F (36.4 C)     Temp Source Oral     SpO2 97 %     Weight      Height      Head Circumference      Peak Flow      Pain Score 8     Pain Loc      Pain Education      Exclude from Growth Chart    Orthostatic VS for the past 24 hrs:  BP- Lying Pulse- Lying BP- Sitting Pulse- Sitting BP- Standing at 0 minutes Pulse- Standing at 0 minutes  03/16/24 1934 154/88 59 184/80 67 (!) 166/93 79    Updated Vital Signs BP (!) 167/82 (BP Location: Left Arm)   Pulse 75   Temp 97.6 F (36.4 C) (Oral)   Resp 16   SpO2 97%   Visual Acuity Right Eye Distance: 20/70  with out glasses Left Eye Distance: 20/70  with out  glasses Bilateral Distance: 20/70 with out glasses  Right Eye Near:   Left Eye Near:    Bilateral Near:     Physical Exam Vitals reviewed.  Constitutional:      General: He is awake. He is not in acute distress.    Appearance: Normal appearance. He is well-developed and well-groomed. He is not ill-appearing, toxic-appearing or diaphoretic.  HENT:     Head: Normocephalic and atraumatic.  Eyes:     General:  Lids are normal. Gaze aligned appropriately.     Extraocular Movements: Extraocular movements intact.     Conjunctiva/sclera: Conjunctivae normal.     Pupils: Pupils are equal, round, and reactive to light.  Cardiovascular:     Rate and Rhythm: Normal rate and regular rhythm.     Heart sounds: Normal heart sounds. No murmur heard.    No friction rub. No gallop.  Pulmonary:     Effort: Pulmonary effort is normal.     Breath sounds: Normal breath sounds. No decreased air movement. No decreased breath sounds, wheezing, rhonchi or rales.  Abdominal:     General: Abdomen is flat. Bowel sounds are normal.     Palpations: Abdomen is soft.     Tenderness: There is no abdominal tenderness.  Musculoskeletal:     Cervical back: Normal range of motion and neck supple.  Skin:    General: Skin is warm and dry.  Neurological:     General: No focal deficit present.     Mental Status: He is alert and oriented to person, place, and time.     Cranial Nerves: No cranial nerve deficit, dysarthria or facial asymmetry.     Motor: No weakness, tremor, atrophy or abnormal muscle tone.     Gait: Gait is intact.  Psychiatric:        Attention and Perception: Attention normal.        Mood and Affect: Mood normal.        Speech: Speech normal.        Behavior: Behavior normal. Behavior is cooperative.      UC Treatments / Results  Labs (all labs ordered are listed, but only abnormal results are displayed) Labs Reviewed - No data to display  EKG   Radiology No results found.  Procedures Procedures (including critical care time)  Medications Ordered in UC Medications  acetaminophen  (TYLENOL ) tablet 975 mg (975 mg Oral Given 03/16/24 1931)    Initial Impression / Assessment  and Plan / UC Course  I have reviewed the triage vital signs and the nursing notes.  Pertinent labs & imaging results that were available during my care of the patient were reviewed by me and considered in my medical  decision making (see chart for details).      Final Clinical Impressions(s) / UC Diagnoses   Final diagnoses:  Bad headache  Vomiting without nausea, unspecified vomiting type  Dizziness   Patient presents today with concerns for headache along the forehead, several episodes of vomiting and dizziness as well as diplopia.  He reports this happened while he was driving home from work in [place].  Neurological and physical exams are reassuring in clinic today.  Vitals are notable for elevated blood pressure but he reports that he has not taken his blood pressure medication today.  Tylenol  975 mg administered in clinic which provided significant relief for headache.  Patient was able to eat and drink in clinic without continued nausea or vomiting and reports that his symptoms have largely improved following medication administration and p.o. intake.  At this time I suspect headache and dizziness are likely secondary to vomiting episodes and potential mild dehydration.  Recommend bland diet for the next few days as well as increase hydration efforts.  Reviewed that patient should take his blood pressure medication once he gets home per directions to assist with BP management.  Reviewed ED and return precautions which were also provided in AVS.  Follow-up as needed for progressing or persistent symptoms    Discharge Instructions      You were seen today for concerns of a headache along with vomiting and dizziness.  You reported that your headache has largely improved following a dose of Tylenol  here in clinic.  You have also been able to tolerate eating and drinking and reports that you are feeling a lot better after this.  I am unsure what caused your initial symptoms but it is reassuring that they have improved.  I suspect that your dizziness and headache may have been secondary to the vomiting and mild dehydration.  I encourage you to stay well-hydrated and eat some bland food for the next 1 to 2  days until you are feeling better.  Please make sure that you are eating regularly throughout the day to prevent fatigue for further dehydration.  I recommend taking Tylenol  per manufacturer's instructions for further headache pain.  If you start to develop more severe headache pain, severe dizziness, nausea and vomiting that are preventing you from eating or drinking, confusion, passing out please go to the emergency room as these could be signs of a medical emergency.     ED Prescriptions   None    PDMP not reviewed this encounter.   Marylene Rocky BRAVO, PA-C 03/16/24 1956

## 2024-03-16 NOTE — Discharge Instructions (Addendum)
 You were seen today for concerns of a headache along with vomiting and dizziness.  You reported that your headache has largely improved following a dose of Tylenol  here in clinic.  You have also been able to tolerate eating and drinking and reports that you are feeling a lot better after this.  I am unsure what caused your initial symptoms but it is reassuring that they have improved.  I suspect that your dizziness and headache may have been secondary to the vomiting and mild dehydration.  I encourage you to stay well-hydrated and eat some bland food for the next 1 to 2 days until you are feeling better.  Please make sure that you are eating regularly throughout the day to prevent fatigue for further dehydration.  I recommend taking Tylenol  per manufacturer's instructions for further headache pain.  If you start to develop more severe headache pain, severe dizziness, nausea and vomiting that are preventing you from eating or drinking, confusion, passing out please go to the emergency room as these could be signs of a medical emergency.

## 2024-03-16 NOTE — ED Triage Notes (Signed)
 Patient states that he has had a headache, dizziness, and vomiting. Patient states his vision was blurry earlier in the day.  Patient also reports that he left his medicine in a fleeta because he was in [place] for his job.  Patient denies taking any medication for his symptoms.

## 2024-04-06 ENCOUNTER — Other Ambulatory Visit (INDEPENDENT_AMBULATORY_CARE_PROVIDER_SITE_OTHER): Payer: Self-pay | Admitting: Primary Care

## 2024-04-06 DIAGNOSIS — I5022 Chronic systolic (congestive) heart failure: Secondary | ICD-10-CM

## 2024-04-06 DIAGNOSIS — I151 Hypertension secondary to other renal disorders: Secondary | ICD-10-CM

## 2024-04-06 NOTE — Telephone Encounter (Unsigned)
 Copied from CRM #[id number]. Topic: Clinical - Medication Refill >> Apr 06, 2024 11:23 AM Delon T wrote: Medication: atorvastatin  (LIPITOR ) 40 MG tablet carvedilol  (COREG ) 6.25 MG tablet dapagliflozin  propanediol (FARXIGA ) 10 MG TABS tablet furosemide  (LASIX ) 20 MG tablet  losartan  (COZAAR ) 25 MG tablet nitroGLYCERIN  (NITROSTAT ) 0.4 MG SL tablet spironolactone  (ALDACTONE ) 25 MG tablet ticagrelor  (BRILINTA ) 60 MG TABS tablet   Has the patient contacted their pharmacy? No (Agent: If no, request that the patient contact the pharmacy for the refill. If patient does not wish to contact the pharmacy document the reason why and proceed with request.) (Agent: If yes, when and what did the pharmacy advise?)  This is the patient's preferred pharmacy:  [facility] [address], [address] [place] [address] Phone: [phone] Fax: [phone]    Is this the correct pharmacy for this prescription? Yes If no, delete pharmacy and type the correct one.   Has the prescription been filled recently? Yes  Is the patient out of the medication? Yes  Has the patient been seen for an appointment in the last year OR does the patient have an upcoming appointment? Yes  Can we respond through MyChart? No  Agent: Please be advised that Rx refills may take up to 3 business days. We ask that you follow-up with your pharmacy.

## 2024-04-09 ENCOUNTER — Other Ambulatory Visit: Payer: Self-pay

## 2024-04-09 ENCOUNTER — Other Ambulatory Visit (HOSPITAL_COMMUNITY): Payer: Self-pay

## 2024-04-09 NOTE — Telephone Encounter (Signed)
 Last OV 12/30/22, Rx requested-all outside provider Requested Prescriptions  Pending Prescriptions Disp Refills   atorvastatin  (LIPITOR ) 40 MG tablet 90 tablet 3    Sig: Take 1 tablet (40 mg total) by mouth daily.     Cardiovascular:  Antilipid - Statins Failed - 04/09/2024  2:40 PM      Failed - Valid encounter within last 12 months    Recent Outpatient Visits           1 year ago Bilateral swelling of feet   [place] [facility] Celestia Rosaline SQUIBB, NP   1 year ago STD (male)   [place] [facility] Celestia Rosaline SQUIBB, NP   1 year ago Pain of right side of body   [place] [facility] Celestia Rosaline SQUIBB, NP   2 years ago Healthcare maintenance   [place] [facility] Celestia Rosaline SQUIBB, NP   2 years ago Prediabetes   [place] [facility] Celestia Rosaline SQUIBB, NP              Failed - Lipid Panel in normal range within the last 12 months    Cholesterol, Total  Date Value Ref Range Status  11/04/2020 204 (H) 100 - 199 mg/dL Final   Cholesterol  Date Value Ref Range Status  10/26/2022 110 0 - 200 mg/dL Final   LDL Chol Calc (NIH)  Date Value Ref Range Status  11/04/2020 131 (H) 0 - 99 mg/dL Final   LDL Cholesterol  Date Value Ref Range Status  10/26/2022 58 0 - 99 mg/dL Final    Comment:           Total Cholesterol/HDL:CHD Risk Coronary Heart Disease Risk Table                     Men   Women  1/2 Average Risk   3.4   3.3  Average Risk       5.0   4.4  2 X Average Risk   9.6   7.1  3 X Average Risk  23.4   11.0        Use the calculated Patient Ratio above and the CHD Risk Table to determine the patient's CHD Risk.        ATP III CLASSIFICATION (LDL):  <100     mg/dL   Optimal  899-870  mg/dL   Near or Above                    Optimal  130-159  mg/dL   Borderline  839-810  mg/dL   High  >809     mg/dL   Very High Performed at [facility], [address], [place], [place] [address]    HDL  Date Value Ref Range Status  10/26/2022 42 >40 mg/dL Final  94/82/7977 52 >60 mg/dL Final   Triglycerides  Date Value Ref Range Status  10/26/2022 48 <150 mg/dL Final         Passed - Patient is not pregnant       carvedilol  (COREG ) 6.25 MG tablet 60 tablet 1    Sig: Take 1 tablet (6.25 mg total) by mouth 2 (two) times daily with a meal.     Cardiovascular: Beta Blockers 3 Failed - 04/09/2024  2:40 PM      Failed - Cr in normal range and within 360 days    Creatinine, Ser  Date Value Ref  Range Status  03/09/2024 1.45 (H) 0.61 - 1.24 mg/dL Final         Failed - Last BP in normal range    BP Readings from Last 1 Encounters:  03/16/24 (!) 167/82         Failed - Valid encounter within last 6 months    Recent Outpatient Visits           1 year ago Bilateral swelling of feet   [place] [facility] Celestia Rosaline SQUIBB, NP   1 year ago STD (male)   [place] [facility] Celestia Rosaline SQUIBB, NP   1 year ago Pain of right side of body   [place] [facility] Celestia Rosaline SQUIBB, NP   2 years ago Healthcare maintenance   [place] [facility] Celestia Rosaline SQUIBB, NP   2 years ago Prediabetes   [place] [facility] Celestia Rosaline SQUIBB, NP              Passed - AST in normal range and within 360 days    AST  Date Value Ref Range Status  11/27/2023 19 15 - 41 U/L Final         Passed - ALT in normal range and within 360 days    ALT  Date Value Ref Range Status  11/27/2023 14 0 - 44 U/L Final         Passed - Last Heart Rate in normal range    Pulse Readings from Last 1 Encounters:  03/16/24 75          dapagliflozin  propanediol (FARXIGA ) 10 MG TABS tablet 30 tablet 11    Sig: Take 1 tablet (10 mg total) by mouth daily before breakfast.     Endocrinology:  Diabetes - SGLT2 Inhibitors Failed - 04/09/2024  2:40 PM      Failed - Cr  in normal range and within 360 days    Creatinine, Ser  Date Value Ref Range Status  03/09/2024 1.45 (H) 0.61 - 1.24 mg/dL Final         Failed - HBA1C is between 0 and 7.9 and within 180 days    Hgb A1c MFr Bld  Date Value Ref Range Status  03/02/2022 6.2 (H) 4.8 - 5.6 % Final    Comment:    (NOTE) Pre diabetes:          5.7%-6.4%  Diabetes:              >6.4%  Glycemic control for   <7.0% adults with diabetes          Failed - eGFR in normal range and within 360 days    GFR calc Af Amer  Date Value Ref Range Status  11/16/2019 >60 >60 mL/min Final   GFR, Estimated  Date Value Ref Range Status  03/09/2024 53 (L) >60 mL/min Final    Comment:    (NOTE) Calculated using the CKD-EPI Creatinine Equation (2021)    eGFR  Date Value Ref Range Status  04/23/2021 48 (L) >59 mL/min/1.73 Final   EGFR  Date Value Ref Range Status  01/13/2023 56.0  Final    Comment:    Abstracted by HIM         Failed - Valid encounter within last 6 months    Recent Outpatient Visits           1 year ago Bilateral swelling of feet    [facility]  Celestia Rosaline SQUIBB, NP   1 year ago STD (male)   [place] [facility] Celestia Rosaline SQUIBB, NP   1 year ago Pain of right side of body   [place] [facility] Celestia Rosaline SQUIBB, NP   2 years ago Healthcare maintenance   [place] [facility] Celestia Rosaline SQUIBB, NP   2 years ago Prediabetes   [place] [facility] Celestia Rosaline SQUIBB, NP               furosemide  (LASIX ) 20 MG tablet 30 tablet 11    Sig: Take 1 tablet (20 mg total) by mouth as needed for weight gain of 3 lbs in 24 hours or 5 lbs in a week     Cardiovascular:  Diuretics - Loop Failed - 04/09/2024  2:40 PM      Failed - Cr in normal range and within 180 days    Creatinine, Ser  Date Value Ref Range Status  03/09/2024 1.45 (H) 0.61 - 1.24 mg/dL Final         Failed  - Mg Level in normal range and within 180 days    Magnesium   Date Value Ref Range Status  03/02/2022 1.8 1.7 - 2.4 mg/dL Final    Comment:    Performed at [facility], [address] [place], [place] [address]         Failed - Last BP in normal range    BP Readings from Last 1 Encounters:  03/16/24 (!) 167/82         Failed - Valid encounter within last 6 months    Recent Outpatient Visits           1 year ago Bilateral swelling of feet   [place] [facility] Celestia Rosaline SQUIBB, NP   1 year ago STD (male)   [place] [facility] Celestia Rosaline SQUIBB, NP   1 year ago Pain of right side of body   [place] [facility] Celestia Rosaline SQUIBB, NP   2 years ago Healthcare maintenance   [place] [facility] Celestia Rosaline SQUIBB, NP   2 years ago Prediabetes   [place] [facility] Celestia Rosaline SQUIBB, NP              Passed - K in normal range and within 180 days    Potassium  Date Value Ref Range Status  03/09/2024 3.7 3.5 - 5.1 mmol/L Final         Passed - Ca in normal range and within 180 days    Calcium   Date Value Ref Range Status  03/09/2024 9.1 8.9 - 10.3 mg/dL Final   Calcium , Ion  Date Value Ref Range Status  03/12/2021 0.96 (L) 1.15 - 1.40 mmol/L Final         Passed - Na in normal range and within 180 days    Sodium  Date Value Ref Range Status  03/09/2024 138 135 - 145 mmol/L Final  04/23/2021 140 134 - 144 mmol/L Final         Passed - Cl in normal range and within 180 days    Chloride  Date Value Ref Range Status  03/09/2024 109 98 - 111 mmol/L Final          losartan  (COZAAR ) 25 MG tablet 30 tablet 11    Sig: Take 1 tablet (25 mg total) by mouth daily.  Cardiovascular:  Angiotensin Receptor Blockers Failed - 04/09/2024  2:40 PM      Failed - Cr in normal range and within 180 days    Creatinine, Ser  Date Value Ref Range Status  03/09/2024  1.45 (H) 0.61 - 1.24 mg/dL Final         Failed - Last BP in normal range    BP Readings from Last 1 Encounters:  03/16/24 (!) 167/82         Failed - Valid encounter within last 6 months    Recent Outpatient Visits           1 year ago Bilateral swelling of feet   [place] [facility] Celestia Rosaline SQUIBB, NP   1 year ago STD (male)   [place] [facility] Celestia Rosaline SQUIBB, NP   1 year ago Pain of right side of body   [place] [facility] Celestia Rosaline SQUIBB, NP   2 years ago Healthcare maintenance   [place] [facility] Celestia Rosaline SQUIBB, NP   2 years ago Prediabetes   [place] [facility] Celestia Rosaline SQUIBB, NP              Passed - K in normal range and within 180 days    Potassium  Date Value Ref Range Status  03/09/2024 3.7 3.5 - 5.1 mmol/L Final         Passed - Patient is not pregnant       nitroGLYCERIN  (NITROSTAT ) 0.4 MG SL tablet 25 tablet 1    Sig: Place 1 tablet (0.4 mg total) under the tongue every 5 (five) minutes as needed for chest pain.     Cardiovascular:  Nitrates Failed - 04/09/2024  2:40 PM      Failed - Last BP in normal range    BP Readings from Last 1 Encounters:  03/16/24 (!) 167/82         Failed - Valid encounter within last 12 months    Recent Outpatient Visits           1 year ago Bilateral swelling of feet   [place] [facility] Celestia Rosaline SQUIBB, NP   1 year ago STD (male)   [place] [facility] Celestia Rosaline SQUIBB, NP   1 year ago Pain of right side of body   [place]  [facility] Celestia Rosaline SQUIBB, NP   2 years ago Healthcare maintenance   [place] [facility] Celestia Rosaline SQUIBB, NP   2 years ago Prediabetes    [facility] Celestia Rosaline SQUIBB, NP              Passed - Last Heart Rate in normal range    Pulse  Readings from Last 1 Encounters:  03/16/24 75          spironolactone  (ALDACTONE ) 25 MG tablet 90 tablet 3    Sig: Take 1 tablet (25 mg total) by mouth daily.     Cardiovascular: Diuretics - Aldosterone Antagonist Failed - 04/09/2024  2:40 PM      Failed - Cr in normal range and within 180 days    Creatinine, Ser  Date Value Ref Range Status  03/09/2024 1.45 (H) 0.61 - 1.24 mg/dL Final         Failed - Last BP in normal range    BP Readings from Last 1 Encounters:  03/16/24 (!) 167/82  Failed - Valid encounter within last 6 months    Recent Outpatient Visits           1 year ago Bilateral swelling of feet   [place] [facility] Celestia Rosaline SQUIBB, NP   1 year ago STD (male)   [place] [facility] Celestia Rosaline SQUIBB, NP   1 year ago Pain of right side of body   [place] [facility] Celestia Rosaline SQUIBB, NP   2 years ago Healthcare maintenance   [place] [facility] Celestia Rosaline SQUIBB, NP   2 years ago Prediabetes   [place] [facility] Celestia Rosaline SQUIBB, NP              Passed - K in normal range and within 180 days    Potassium  Date Value Ref Range Status  03/09/2024 3.7 3.5 - 5.1 mmol/L Final         Passed - Na in normal range and within 180 days    Sodium  Date Value Ref Range Status  03/09/2024 138 135 - 145 mmol/L Final  04/23/2021 140 134 - 144 mmol/L Final         Passed - eGFR is 30 or above and within 180 days    GFR calc Af Amer  Date Value Ref Range Status  11/16/2019 >60 >60 mL/min Final   GFR, Estimated  Date Value Ref Range Status  03/09/2024 53 (L) >60 mL/min Final    Comment:    (NOTE) Calculated using the CKD-EPI Creatinine Equation (2021)    eGFR  Date Value Ref Range Status  04/23/2021 48 (L) >59 mL/min/1.73 Final   EGFR  Date Value Ref Range Status  01/13/2023 56.0  Final    Comment:    Abstracted by HIM           ticagrelor  (BRILINTA ) 60 MG TABS tablet 60 tablet 12    Sig: Take 1 tablet (60 mg total) by mouth 2 (two) times daily.     Hematology: Antiplatelets - ticagrelor  Failed - 04/09/2024  2:40 PM      Failed - Cr in normal range and within 180 days    Creatinine, Ser  Date Value Ref Range Status  03/09/2024 1.45 (H) 0.61 - 1.24 mg/dL Final         Failed - Valid encounter within last 6 months    Recent Outpatient Visits           1 year ago Bilateral swelling of feet   [place] [facility] Celestia Rosaline SQUIBB, NP   1 year ago STD (male)   [place] [facility] Celestia Rosaline SQUIBB, NP   1 year ago Pain of right side of body   [place] [facility] Celestia Rosaline SQUIBB, NP   2 years ago Healthcare maintenance   [place] [facility] Celestia Rosaline SQUIBB, NP   2 years ago Prediabetes   [place] [facility] Celestia Rosaline SQUIBB, NP              Passed - HCT in normal range and within 180 days    HCT  Date Value Ref Range Status  11/27/2023 48.8 39.0 - 52.0 % Final         Passed - HGB in normal range and within 180 days    Hemoglobin  Date Value Ref Range Status  11/27/2023 16.5 13.0 - 17.0 g/dL Final  Total hemoglobin  Date Value Ref Range Status  03/19/2021 12.5 12.0 - 16.0 g/dL Final         Passed - PLT in normal range and within 180 days    Platelets  Date Value Ref Range Status  11/27/2023 272 150 - 400 K/uL Final         Passed - Last Heart Rate in normal range    Pulse Readings from Last 1 Encounters:  03/16/24 75

## 2024-04-30 ENCOUNTER — Other Ambulatory Visit (HOSPITAL_COMMUNITY): Payer: Self-pay

## 2024-05-04 ENCOUNTER — Encounter (HOSPITAL_COMMUNITY): Payer: Self-pay

## 2024-05-04 ENCOUNTER — Ambulatory Visit (HOSPITAL_COMMUNITY): Payer: Self-pay | Admitting: Family Medicine

## 2024-05-04 ENCOUNTER — Ambulatory Visit (HOSPITAL_COMMUNITY)
Admission: RE | Admit: 2024-05-04 | Discharge: 2024-05-04 | Disposition: A | Discharge status: Home / Self Care | Payer: Self-pay | Source: Ambulatory Visit | Attending: Primary Care | Admitting: Primary Care

## 2024-05-04 DIAGNOSIS — I110 Hypertensive heart disease with heart failure: Secondary | ICD-10-CM | POA: Insufficient documentation

## 2024-05-04 DIAGNOSIS — Z87891 Personal history of nicotine dependence: Secondary | ICD-10-CM | POA: Insufficient documentation

## 2024-05-04 DIAGNOSIS — I5022 Chronic systolic (congestive) heart failure: Secondary | ICD-10-CM | POA: Insufficient documentation

## 2024-05-04 DIAGNOSIS — I2510 Atherosclerotic heart disease of native coronary artery without angina pectoris: Secondary | ICD-10-CM | POA: Insufficient documentation

## 2024-05-04 LAB — ECHOCARDIOGRAM COMPLETE
Area-P 1/2: 3.51 cm2
Calc EF: 43.1 %
S' Lateral: 4.2 cm
Single Plane A2C EF: 54.8 %
Single Plane A4C EF: 25.6 %

## 2024-05-04 MED ORDER — PERFLUTREN LIPID MICROSPHERE
1.0000 mL | INTRAVENOUS | Status: AC | PRN
  Administered 2024-05-04: 5 mL via INTRAVENOUS

## 2024-05-04 NOTE — Progress Notes (Signed)
  Echocardiogram 2D Echocardiogram has been performed.  Logan Brewer 05/04/2024, 4:18 PM

## 2024-05-04 NOTE — Progress Notes (Signed)
 Medication Samples have been provided to the patient.  Drug name: Eliquis       Strength: 5 mg         Qty: 4 boxes  LOT: [id number]  Exp.Date: 07/26  Dosing instructions: take 1 tablet Twice daily   The patient has been instructed regarding the correct time, dose, and frequency of taking this medication, including desired effects and most common side effects.   Leonardo Makris M Meleane Selinger 4:03 PM 05/04/2024

## 2024-05-07 ENCOUNTER — Other Ambulatory Visit (HOSPITAL_COMMUNITY): Payer: Self-pay

## 2024-05-14 ENCOUNTER — Other Ambulatory Visit (INDEPENDENT_AMBULATORY_CARE_PROVIDER_SITE_OTHER): Payer: Self-pay | Admitting: Primary Care

## 2024-05-14 DIAGNOSIS — I5022 Chronic systolic (congestive) heart failure: Secondary | ICD-10-CM

## 2024-05-14 DIAGNOSIS — I151 Hypertension secondary to other renal disorders: Secondary | ICD-10-CM

## 2024-05-14 NOTE — Telephone Encounter (Unsigned)
 Copied from CRM #[id number]. Topic: Clinical - Medication Refill >> May 14, 2024 12:46 PM Shanda MATSU wrote: Medication: atorvastatin  (LIPITOR ) 40 MG tablet  carvedilol  (COREG ) 6.25 MG tablet  dapagliflozin  propanediol (FARXIGA ) 10 MG TABS tablet  furosemide  (LASIX ) 20 MG tablet  losartan  (COZAAR ) 25 MG tablet  nitroGLYCERIN  (NITROSTAT ) 0.4 MG SL tablet  spironolactone  (ALDACTONE ) 25 MG tablet  ticagrelor  (BRILINTA ) 60 MG TABS tablet    Has the patient contacted their pharmacy? No (Agent: If no, request that the patient contact the pharmacy for the refill. If patient does not wish to contact the pharmacy document the reason why and proceed with request.) (Agent: If yes, when and what did the pharmacy advise?)  This is the patient's preferred pharmacy:  [place] [facility] [address] [place] [address] Phone: [phone] Fax: [phone]  Is this the correct pharmacy for this prescription? Yes If no, delete pharmacy and type the correct one.   Has the prescription been filled recently? No  Is the patient out of the medication? Yes  Has the patient been seen for an appointment in the last year OR does the patient have an upcoming appointment? Yes  Can we respond through MyChart? No  Agent: Please be advised that Rx refills may take up to 3 business days. We ask that you follow-up with your pharmacy.

## 2024-05-15 NOTE — Telephone Encounter (Signed)
 All medications provided by cardiology Requested Prescriptions  Pending Prescriptions Disp Refills   atorvastatin  (LIPITOR ) 40 MG tablet 90 tablet 3    Sig: Take 1 tablet (40 mg total) by mouth daily.     Cardiovascular:  Antilipid - Statins Failed - 05/15/2024  2:08 PM      Failed - Valid encounter within last 12 months    Recent Outpatient Visits           1 year ago Bilateral swelling of feet   [place] [facility] Celestia Rosaline SQUIBB, NP   1 year ago STD (male)   [place] [facility] Celestia Rosaline SQUIBB, NP   1 year ago Pain of right side of body   [place] [facility] Celestia Rosaline SQUIBB, NP   2 years ago Healthcare maintenance   [place] [facility] Celestia Rosaline SQUIBB, NP   2 years ago Prediabetes   [place] [facility] Celestia Rosaline SQUIBB, NP              Failed - Lipid Panel in normal range within the last 12 months    Cholesterol, Total  Date Value Ref Range Status  11/04/2020 204 (H) 100 - 199 mg/dL Final   Cholesterol  Date Value Ref Range Status  10/26/2022 110 0 - 200 mg/dL Final   LDL Chol Calc (NIH)  Date Value Ref Range Status  11/04/2020 131 (H) 0 - 99 mg/dL Final   LDL Cholesterol  Date Value Ref Range Status  10/26/2022 58 0 - 99 mg/dL Final    Comment:           Total Cholesterol/HDL:CHD Risk Coronary Heart Disease Risk Table                     Men   Women  1/2 Average Risk   3.4   3.3  Average Risk       5.0   4.4  2 X Average Risk   9.6   7.1  3 X Average Risk  23.4   11.0        Use the calculated Patient Ratio above and the CHD Risk Table to determine the patient's CHD Risk.        ATP III CLASSIFICATION (LDL):  <100     mg/dL   Optimal  899-870  mg/dL   Near or Above                    Optimal  130-159  mg/dL   Borderline  839-810  mg/dL   High  >809     mg/dL   Very High Performed at [facility], [address],  [place], [place] [address]    HDL  Date Value Ref Range Status  10/26/2022 42 >40 mg/dL Final  94/82/7977 52 >60 mg/dL Final   Triglycerides  Date Value Ref Range Status  10/26/2022 48 <150 mg/dL Final         Passed - Patient is not pregnant       carvedilol  (COREG ) 6.25 MG tablet 60 tablet 1    Sig: Take 1 tablet (6.25 mg total) by mouth 2 (two) times daily with a meal.     Cardiovascular: Beta Blockers 3 Failed - 05/15/2024  2:08 PM      Failed - Cr in normal range and within 360 days    Creatinine, Ser  Date Value Ref Range Status  03/09/2024 1.45 (H) 0.61 - 1.24 mg/dL Final         Failed - Last BP in normal range    BP Readings from Last 1 Encounters:  03/16/24 (!) 167/82         Failed - Valid encounter within last 6 months    Recent Outpatient Visits           1 year ago Bilateral swelling of feet   [place] [facility] Celestia Rosaline SQUIBB, NP   1 year ago STD (male)   [place] [facility] Celestia Rosaline SQUIBB, NP   1 year ago Pain of right side of body   [place] [facility] Celestia Rosaline SQUIBB, NP   2 years ago Healthcare maintenance   [place] [facility] Celestia Rosaline SQUIBB, NP   2 years ago Prediabetes   [place] [facility] Celestia Rosaline SQUIBB, NP              Passed - AST in normal range and within 360 days    AST  Date Value Ref Range Status  11/27/2023 19 15 - 41 U/L Final         Passed - ALT in normal range and within 360 days    ALT  Date Value Ref Range Status  11/27/2023 14 0 - 44 U/L Final         Passed - Last Heart Rate in normal range    Pulse Readings from Last 1 Encounters:  03/16/24 75          dapagliflozin  propanediol (FARXIGA ) 10 MG TABS tablet 30 tablet 11    Sig: Take 1 tablet (10 mg total) by mouth daily before breakfast.     Endocrinology:  Diabetes - SGLT2 Inhibitors Failed - 05/15/2024  2:08 PM      Failed - Cr in  normal range and within 360 days    Creatinine, Ser  Date Value Ref Range Status  03/09/2024 1.45 (H) 0.61 - 1.24 mg/dL Final         Failed - HBA1C is between 0 and 7.9 and within 180 days    Hgb A1c MFr Bld  Date Value Ref Range Status  03/02/2022 6.2 (H) 4.8 - 5.6 % Final    Comment:    (NOTE) Pre diabetes:          5.7%-6.4%  Diabetes:              >6.4%  Glycemic control for   <7.0% adults with diabetes          Failed - eGFR in normal range and within 360 days    GFR calc Af Amer  Date Value Ref Range Status  11/16/2019 >60 >60 mL/min Final   GFR, Estimated  Date Value Ref Range Status  03/09/2024 53 (L) >60 mL/min Final    Comment:    (NOTE) Calculated using the CKD-EPI Creatinine Equation (2021)    eGFR  Date Value Ref Range Status  04/23/2021 48 (L) >59 mL/min/1.73 Final   EGFR  Date Value Ref Range Status  01/13/2023 56.0  Final    Comment:    Abstracted by HIM         Failed - Valid encounter within last 6 months    Recent Outpatient Visits           1 year ago Bilateral swelling of feet    [facility] Celestia Rosaline SQUIBB,  NP   1 year ago STD (male)   [place] [facility] Celestia Rosaline SQUIBB, NP   1 year ago Pain of right side of body   [place] [facility] Celestia Rosaline SQUIBB, NP   2 years ago Healthcare maintenance   [place] [facility] Celestia Rosaline SQUIBB, NP   2 years ago Prediabetes   [place] [facility] Celestia Rosaline SQUIBB, NP               furosemide  (LASIX ) 20 MG tablet 30 tablet 11    Sig: Take 1 tablet (20 mg total) by mouth as needed for weight gain of 3 lbs in 24 hours or 5 lbs in a week     Cardiovascular:  Diuretics - Loop Failed - 05/15/2024  2:08 PM      Failed - Cr in normal range and within 180 days    Creatinine, Ser  Date Value Ref Range Status  03/09/2024 1.45 (H) 0.61 - 1.24 mg/dL Final         Failed -  Mg Level in normal range and within 180 days    Magnesium   Date Value Ref Range Status  03/02/2022 1.8 1.7 - 2.4 mg/dL Final    Comment:    Performed at [facility], [address], [place], [place] [address]         Failed - Last BP in normal range    BP Readings from Last 1 Encounters:  03/16/24 (!) 167/82         Failed - Valid encounter within last 6 months    Recent Outpatient Visits           1 year ago Bilateral swelling of feet   [place] [facility] Celestia Rosaline SQUIBB, NP   1 year ago STD (male)   [place] [facility] Celestia Rosaline SQUIBB, NP   1 year ago Pain of right side of body   [place]  [facility] Celestia Rosaline SQUIBB, NP   2 years ago Healthcare maintenance   [place] [facility] Celestia Rosaline SQUIBB, NP   2 years ago Prediabetes    [facility] Celestia Rosaline SQUIBB, NP              Passed - K in normal range and within 180 days    Potassium  Date Value Ref Range Status  03/09/2024 3.7 3.5 - 5.1 mmol/L Final         Passed - Ca in normal range and within 180 days    Calcium   Date Value Ref Range Status  03/09/2024 9.1 8.9 - 10.3 mg/dL Final   Calcium , Ion  Date Value Ref Range Status  03/12/2021 0.96 (L) 1.15 - 1.40 mmol/L Final         Passed - Na in normal range and within 180 days    Sodium  Date Value Ref Range Status  03/09/2024 138 135 - 145 mmol/L Final  04/23/2021 140 134 - 144 mmol/L Final         Passed - Cl in normal range and within 180 days    Chloride  Date Value Ref Range Status  03/09/2024 109 98 - 111 mmol/L Final          losartan  (COZAAR ) 25 MG tablet 30 tablet 11    Sig: Take 1 tablet (25 mg total) by mouth daily.     Cardiovascular:  Angiotensin Receptor Blockers Failed - 05/15/2024  2:08 PM      Failed - Cr in normal range and within 180 days    Creatinine, Ser  Date Value Ref Range Status  03/09/2024  1.45 (H) 0.61 - 1.24 mg/dL Final         Failed - Last BP in normal range    BP Readings from Last 1 Encounters:  03/16/24 (!) 167/82         Failed - Valid encounter within last 6 months    Recent Outpatient Visits           1 year ago Bilateral swelling of feet   [place] [facility] Celestia Rosaline SQUIBB, NP   1 year ago STD (male)   [place] [facility] Celestia Rosaline SQUIBB, NP   1 year ago Pain of right side of body   [place] [facility] Celestia Rosaline SQUIBB, NP   2 years ago Healthcare maintenance   [place] [facility] Celestia Rosaline SQUIBB, NP   2 years ago Prediabetes   [place] [facility] Celestia Rosaline SQUIBB, NP              Passed - K in normal range and within 180 days    Potassium  Date Value Ref Range Status  03/09/2024 3.7 3.5 - 5.1 mmol/L Final         Passed - Patient is not pregnant       nitroGLYCERIN  (NITROSTAT ) 0.4 MG SL tablet 25 tablet 1    Sig: Place 1 tablet (0.4 mg total) under the tongue every 5 (five) minutes as needed for chest pain.     Cardiovascular:  Nitrates Failed - 05/15/2024  2:08 PM      Failed - Last BP in normal range    BP Readings from Last 1 Encounters:  03/16/24 (!) 167/82         Failed - Valid encounter within last 12 months    Recent Outpatient Visits           1 year ago Bilateral swelling of feet   [place] [facility] Celestia Rosaline SQUIBB, NP   1 year ago STD (male)   [place] [facility] Celestia Rosaline SQUIBB, NP   1 year ago Pain of right side of body   [place] [facility] Celestia Rosaline SQUIBB, NP   2 years ago Healthcare maintenance   [place] [facility] Celestia Rosaline SQUIBB, NP   2 years ago Prediabetes    [facility] Celestia Rosaline SQUIBB, NP              Passed - Last Heart Rate in normal range    Pulse  Readings from Last 1 Encounters:  03/16/24 75          spironolactone  (ALDACTONE ) 25 MG tablet 90 tablet 3    Sig: Take 1 tablet (25 mg total) by mouth daily.     Cardiovascular: Diuretics - Aldosterone Antagonist Failed - 05/15/2024  2:08 PM      Failed - Cr in normal range and within 180 days    Creatinine, Ser  Date Value Ref Range Status  03/09/2024 1.45 (H) 0.61 - 1.24 mg/dL Final         Failed - Last BP in normal range    BP Readings from Last 1 Encounters:  03/16/24 (!) 167/82  Failed - Valid encounter within last 6 months    Recent Outpatient Visits           1 year ago Bilateral swelling of feet   [place] [facility] Celestia Rosaline SQUIBB, NP   1 year ago STD (male)   [place] [facility] Celestia Rosaline SQUIBB, NP   1 year ago Pain of right side of body   [place] [facility] Celestia Rosaline SQUIBB, NP   2 years ago Healthcare maintenance   [place] [facility] Celestia Rosaline SQUIBB, NP   2 years ago Prediabetes   [place] [facility] Celestia Rosaline SQUIBB, NP              Passed - K in normal range and within 180 days    Potassium  Date Value Ref Range Status  03/09/2024 3.7 3.5 - 5.1 mmol/L Final         Passed - Na in normal range and within 180 days    Sodium  Date Value Ref Range Status  03/09/2024 138 135 - 145 mmol/L Final  04/23/2021 140 134 - 144 mmol/L Final         Passed - eGFR is 30 or above and within 180 days    GFR calc Af Amer  Date Value Ref Range Status  11/16/2019 >60 >60 mL/min Final   GFR, Estimated  Date Value Ref Range Status  03/09/2024 53 (L) >60 mL/min Final    Comment:    (NOTE) Calculated using the CKD-EPI Creatinine Equation (2021)    eGFR  Date Value Ref Range Status  04/23/2021 48 (L) >59 mL/min/1.73 Final   EGFR  Date Value Ref Range Status  01/13/2023 56.0  Final    Comment:    Abstracted by HIM           ticagrelor  (BRILINTA ) 60 MG TABS tablet 60 tablet 12    Sig: Take 1 tablet (60 mg total) by mouth 2 (two) times daily.     Hematology: Antiplatelets - ticagrelor  Failed - 05/15/2024  2:08 PM      Failed - Cr in normal range and within 180 days    Creatinine, Ser  Date Value Ref Range Status  03/09/2024 1.45 (H) 0.61 - 1.24 mg/dL Final         Failed - Valid encounter within last 6 months    Recent Outpatient Visits           1 year ago Bilateral swelling of feet   [place] [facility] Celestia Rosaline SQUIBB, NP   1 year ago STD (male)   [place] [facility] Celestia Rosaline SQUIBB, NP   1 year ago Pain of right side of body   [place] [facility] Celestia Rosaline SQUIBB, NP   2 years ago Healthcare maintenance   [place] [facility] Celestia Rosaline SQUIBB, NP   2 years ago Prediabetes    [facility] Celestia Rosaline SQUIBB, NP              Passed - HCT in normal range and within 180 days    HCT  Date Value Ref Range Status  11/27/2023 48.8 39.0 - 52.0 % Final         Passed - HGB in normal range and within 180 days    Hemoglobin  Date Value Ref Range Status  11/27/2023 16.5 13.0 - 17.0 g/dL Final  Total hemoglobin  Date Value Ref Range Status  03/19/2021 12.5 12.0 - 16.0 g/dL Final         Passed - PLT in normal range and within 180 days    Platelets  Date Value Ref Range Status  11/27/2023 272 150 - 400 K/uL Final         Passed - Last Heart Rate in normal range    Pulse Readings from Last 1 Encounters:  03/16/24 75

## 2024-05-16 ENCOUNTER — Other Ambulatory Visit: Payer: Self-pay

## 2024-05-16 ENCOUNTER — Other Ambulatory Visit (HOSPITAL_COMMUNITY): Payer: Self-pay

## 2024-06-01 ENCOUNTER — Inpatient Hospital Stay (HOSPITAL_COMMUNITY): Admission: RE | Admit: 2024-06-01 | Payer: Self-pay | Source: Ambulatory Visit | Admitting: Internal Medicine

## 2024-06-01 NOTE — Progress Notes (Signed)
 Patient did not show for appt. Note left for templating purposes only   ADVANCED HF CLINIC NOTE   Primary Care: Celestia Rosaline SQUIBB, NP HF Cardiologist: Dr. Cherrie  HPI: Logan Brewer is a 68 y.o. male with history of uncontrolled HTN and heavy, longstanding tobacco use, CKD 3a (Scr ~1.5), systolic heart failure, CAD, and VT/VF arrest.   Had VT/VF arrest in 9/22. ECG with anterior STEMI. Cath 99% ostial to proximal LAD treated with PCI/DES. Echo EF 40%.   Echo 04/28/23: EF 45-50%, GIDD, LV with RWMA, RV low normal  Echo 11/25: EF 40-45%  Today he returns for HF follow up.   Cardiac studies: - Echo 04/28/23: EF 45-50%, GIDD, LV with RWMA, RV low normal  - Echo 3/23 EF 40%  - CPX 4/23  with submax test.  FVC 2.27 (66%)      FEV1 1.64 (60%)        BP rest: 126/64 BP peak: 206/68  Peak VO2: 16.8 (67% predicted peak VO2)  VE/VCO2 slope:  42  Peak RER: 0.86    - Ltd Echo (10/22): EF 40-45%, moderate LV dysfunction, mild LVH, RV o  - R/LHC 9/22:  severe single vessel CAD, 95-99% ostial LAD s/p PCI + DES. EF 20-25%. RV normal. No MR, moderate pulm hypertension    Past Medical History:  Diagnosis Date   Hydrocele in adult    Hypertension    Inguinal hernia    Ischemic cardiomyopathy    Smoker 03/16/2021   smokes 1/2 ppd PTA per hx   STEMI involving left anterior descending coronary artery (HCC) 03/12/2021   Current Outpatient Medications  Medication Sig Dispense Refill   atorvastatin  (LIPITOR ) 40 MG tablet Take 1 tablet (40 mg total) by mouth daily. 90 tablet 3   carvedilol  (COREG ) 6.25 MG tablet Take 1 tablet (6.25 mg total) by mouth 2 (two) times daily with a meal. 60 tablet 1   dapagliflozin  propanediol (FARXIGA ) 10 MG TABS tablet Take 1 tablet (10 mg total) by mouth daily before breakfast. 30 tablet 11   furosemide  (LASIX ) 20 MG tablet Take 1 tablet (20 mg total) by mouth as needed for weight gain of 3 lbs in 24 hours or 5 lbs in a week 30 tablet 11    ibuprofen  (ADVIL ) 600 MG tablet Take 1 tablet (600 mg total) by mouth every 8 (eight) hours as needed. 30 tablet 0   losartan  (COZAAR ) 25 MG tablet Take 1 tablet (25 mg total) by mouth daily. 30 tablet 11   nitroGLYCERIN  (NITROSTAT ) 0.4 MG SL tablet Place 1 tablet (0.4 mg total) under the tongue every 5 (five) minutes as needed for chest pain. 25 tablet 1   spironolactone  (ALDACTONE ) 25 MG tablet Take 1 tablet (25 mg total) by mouth daily. 90 tablet 3   ticagrelor  (BRILINTA ) 60 MG TABS tablet Take 1 tablet (60 mg total) by mouth 2 (two) times daily. 60 tablet 12   No current facility-administered medications for this encounter.   No Known Allergies  Social History   Socioeconomic History   Marital status: Single    Spouse name: Not on file   Number of children: Not on file   Years of education: Not on file   Highest education level: Not on file  Occupational History   Not on file  Tobacco Use   Smoking status: Former    Current packs/day: 0.50    Types: Cigarettes   Smokeless tobacco: Never  Vaping Use   Vaping  status: Never Used  Substance and Sexual Activity   Alcohol use: No   Drug use: Yes    Types: Marijuana    Comment: occasionally    Sexual activity: Not on file  Other Topics Concern   Not on file  Social History Narrative   ** Merged History Encounter **       Social Drivers of Health   Tobacco Use: Medium Risk (03/16/2024)   Patient History    Smoking Tobacco Use: Former    Smokeless Tobacco Use: Never    Passive Exposure: Not on file  Financial Resource Strain: Medium Risk (02/14/2024)   Overall Financial Resource Strain (CARDIA)    Difficulty of Paying Living Expenses: Somewhat hard  Food Insecurity: Food Insecurity Present (02/14/2024)   Epic    Worried About Programme Researcher, Broadcasting/film/video in the Last Year: Sometimes true    Ran Out of Food in the Last Year: Never true  Transportation Needs: No Transportation Needs (09/17/2021)   PRAPARE - Scientist, Research (physical Sciences) (Medical): No    Lack of Transportation (Non-Medical): No  Physical Activity: Not on file  Stress: Not on file  Social Connections: Not on file  Intimate Partner Violence: Not on file  Depression (PHQ2-9): Low Risk (03/24/2022)   Depression (PHQ2-9)    PHQ-2 Score: 0  Alcohol Screen: Not on file  Housing: Medium Risk (09/18/2021)   Housing    Last Housing Risk Score: 1  Utilities: Not on file  Health Literacy: Not on file   Family History  Problem Relation Age of Onset   Heart disease Neg Hx    There were no vitals taken for this visit.  Wt Readings from Last 3 Encounters:  02/29/24 78.9 kg (174 lb)  02/14/24 80.1 kg (176 lb 9.6 oz)  11/27/23 79.8 kg (175 lb 14.8 oz)   PHYSICAL EXAM: General:  NAD. No resp difficulty, walked into clinic HEENT: Normal Neck: Supple. No JVD. Cor: Regular rate & rhythm. No rubs, gallops or murmurs. Lungs: Faint crackles LLL Abdomen: Soft, nontender, nondistended.  Extremities: No cyanosis, clubbing, rash, edema Neuro: Alert & oriented x 3, moves all 4 extremities w/o difficulty. Affect pleasant.  ReDs reading: 33 %, normal  Assessment/Plan 1. Chronic Systolic Heart Failure - Secondary to iCM with anterior ST elevation MI/OOH arrest in 9/22 - Initial echo EF 20-25% with akinesis of anteroseptal wall and apex - Bedside echo EF 35-40% 03/14/21.  - Limited echo 03/16/21 EF 40-45%   - Echo 09/17/21 LVEF 40%.  - CPX 4/23 with submax test. FVC 2.27 (66%) FEV1 1.64 (60%)  pVO2: 16.8 (67%) VE/VCO2 42 pRER: 0.86  PETCO2 26  - PFTs suggest significant COPD - Echo 11/24 EF 45-50%, LV with RWMA, GIDD, RV low normal, no MR - Echo 11/25: EF 40-45% - Improved NYHA I-II, volume OK, ReDs 33% - Continue losartan  25 mg daily (unable to afford Entresto  - no insurance) - Continue spironolactone  25 mg daily. - Continue Lasix  20 mg PRN - Continue Farxiga  10 mg daily  - Continue Coreg  6.25 mg bid - Needs BMET in 1 week - Update echo in 3  months  2. CAD - s/p PCI/DES to LAD as above - No chest pain - Off ASA - Continue Brilinta , reduced dose to 60 mg bid. Discussed with Dr. Rolan - Continue ? blocker. - Start atorvastatin  40 mg daily (he stopped atorva 80 in the past due to itching) - Check lipids, repeat LFTs and lipids in  6-8 weeks   3. 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 - Does not qualify for ICD.  - No change  4. HTN - BP ok - Meds as above   5. Tobacco use - Quit completely - PFTs suggest significant COPD - Encouraged THC cessation  6. SDOH - Uninsured  Toribio Fuel, MD  2:34 PM

## 2024-06-04 ENCOUNTER — Telehealth (HOSPITAL_COMMUNITY): Payer: Self-pay

## 2024-06-04 ENCOUNTER — Telehealth (INDEPENDENT_AMBULATORY_CARE_PROVIDER_SITE_OTHER): Payer: Self-pay | Admitting: Primary Care

## 2024-06-04 ENCOUNTER — Other Ambulatory Visit (HOSPITAL_COMMUNITY): Payer: Self-pay

## 2024-06-04 NOTE — Telephone Encounter (Signed)
 Advanced Heart Failure Patient Advocate Encounter  Application for Brilinta , Farxiga  faxed to [employer] on 06/04/2024. Application form attached to patient chart.  Rachel DEL, CPhT Rx Patient Advocate Phone: [phone]

## 2024-06-04 NOTE — Telephone Encounter (Signed)
 Advanced Heart Failure Patient Advocate Encounter  Application for Eliquis faxed to [employer] on 06/04/2024. Application form attached to patient chart.  Rachel DEL, CPhT Rx Patient Advocate Phone: [phone]

## 2024-06-04 NOTE — Telephone Encounter (Signed)
 Patient was approved to receive Farxiga  from [employer] Effective 06/14/2024 to 06/14/2025

## 2024-06-04 NOTE — Telephone Encounter (Signed)
 Called pt to confirm appt. Pt did not answer and LVM for pt about appt.

## 2024-06-04 NOTE — Progress Notes (Signed)
 Applications for Brilinta , Eliquis, Farxiga  faxed to manufacturers on 06/04/2024. Separate encounters created for patient assistance programs.

## 2024-06-04 NOTE — Telephone Encounter (Signed)
 Advanced Heart Failure Patient Advocate Encounter  Medication Samples have been left at registration desk for patient pick up. Drug name: Eliquis  5 MG Qty: 4x 14 ct packages LOT: [id number] Exp.: 11/2025 SIG: Take 1 tablet by mouth twice daily   The patient has been instructed regarding the correct time, dose, and frequency of taking this medication, including desired effects and most common side effects.   Rachel DEL, CPhT Rx Patient Advocate Phone: [phone]

## 2024-06-04 NOTE — Telephone Encounter (Signed)
 Brilinta  no longer covered by [employer] assistance. Forms for Farxiga  only refaxed 06/04/2024.

## 2024-06-05 ENCOUNTER — Ambulatory Visit (INDEPENDENT_AMBULATORY_CARE_PROVIDER_SITE_OTHER): Payer: Self-pay | Admitting: Primary Care

## 2024-06-11 NOTE — Telephone Encounter (Signed)
 Patient was approved to receive Eliquis from [employer] Effective 06/14/2024 to 06/14/2025

## 2024-06-12 NOTE — Telephone Encounter (Signed)
 Advanced Heart Failure Patient Advocate Encounter  Received shipment notification from [employer]. Medication was shipped on 06/07/2024 and should arrive within 7-10 business days.  Rachel DEL, CPhT Rx Patient Advocate Phone: [phone]

## 2024-06-18 ENCOUNTER — Other Ambulatory Visit: Payer: Self-pay

## 2024-06-18 ENCOUNTER — Other Ambulatory Visit (HOSPITAL_COMMUNITY): Payer: Self-pay | Admitting: Internal Medicine

## 2024-06-18 ENCOUNTER — Other Ambulatory Visit (HOSPITAL_COMMUNITY): Payer: Self-pay

## 2024-06-18 ENCOUNTER — Other Ambulatory Visit (HOSPITAL_BASED_OUTPATIENT_CLINIC_OR_DEPARTMENT_OTHER): Payer: Self-pay

## 2024-06-18 DIAGNOSIS — I151 Hypertension secondary to other renal disorders: Secondary | ICD-10-CM

## 2024-06-18 MED ORDER — CARVEDILOL 6.25 MG PO TABS
6.2500 mg | ORAL_TABLET | Freq: Two times a day (BID) | ORAL | 5 refills | Status: AC
  Filled 2024-06-18: qty 60, 30d supply, fill #0
  Filled 2024-07-26: qty 60, 30d supply, fill #1

## 2024-06-27 ENCOUNTER — Other Ambulatory Visit (HOSPITAL_COMMUNITY): Payer: Self-pay

## 2024-06-27 NOTE — Telephone Encounter (Signed)
 Patient was approved to receive Eliquis from [employer] Effective 06/27/2024 to 06/26/2025

## 2024-07-26 ENCOUNTER — Other Ambulatory Visit (HOSPITAL_COMMUNITY): Payer: Self-pay

## 2024-07-26 ENCOUNTER — Other Ambulatory Visit (INDEPENDENT_AMBULATORY_CARE_PROVIDER_SITE_OTHER): Payer: Self-pay | Admitting: Primary Care

## 2024-07-26 ENCOUNTER — Other Ambulatory Visit (HOSPITAL_COMMUNITY): Payer: Self-pay | Admitting: Family Medicine

## 2024-07-26 DIAGNOSIS — I151 Hypertension secondary to other renal disorders: Secondary | ICD-10-CM

## 2024-07-26 DIAGNOSIS — R52 Pain, unspecified: Secondary | ICD-10-CM

## 2024-07-26 DIAGNOSIS — I5022 Chronic systolic (congestive) heart failure: Secondary | ICD-10-CM

## 2024-07-26 NOTE — Telephone Encounter (Signed)
 [employer] called and spoke to Alexis, [facility] about the refill(s) medications requested. She says the one's that they don't have are ibuprofen , NTG, and Furosemide . She says the patient receives the Falcon and Farxiga  from the HF clinic, so he will need to call them. All others have been processed and will be ready for pickup tomorrow. Patient called to advised to call HF clinic, no answer on the number on file, no voicemail set up, recording to try the call again later.    Copied from CRM #[id number]. Topic: Clinical - Medication Refill >> Jul 26, 2024  4:24 PM Alfonso ORN wrote: Medication: atorvastatin  (LIPITOR ) 40 MG tablet carvedilol  (COREG ) 6.25 MG tablet dapagliflozin  propanediol (FARXIGA ) 10 MG TABS tablet furosemide  (LASIX ) 20 MG tablet ibuprofen  (ADVIL ) 600 MG tablet losartan  (COZAAR ) 25 MG tablet nitroGLYCERIN  (NITROSTAT ) 0.4 MG SL tablet spironolactone  (ALDACTONE ) 25 MG tablet ticagrelor  (BRILINTA ) 60 MG TABS tablet     Has the patient contacted their pharmacy? No (Agent: If no, request that the patient contact the pharmacy for the refill. If patient does not wish to contact the pharmacy document the reason why and proceed with request.) (Agent: If yes, when and what did the pharmacy advise?)  This is the patient's preferred pharmacy:  [facility] [address], [address] [place] [address] Phone: [phone] Fax: [phone]   Is this the correct pharmacy for this prescription? Yes If no, delete pharmacy and type the correct one.   Has the prescription been filled recently? Yes  Is the patient out of the medication? No  Has the patient been seen for an appointment in the last year OR does the patient have an upcoming appointment? yes  Can we respond through MyChart?yes

## 2024-07-27 ENCOUNTER — Other Ambulatory Visit: Payer: Self-pay

## 2024-07-27 NOTE — Telephone Encounter (Signed)
 Requested medications are due for refill today.  Pt is requesting refills  Requested medications are on the active medications list.  yes  Last refill. varied  Future visit scheduled.   yes  Notes to clinic.  Please review for refill 2 are signed by other provider and IBU is an old rx.    Requested Prescriptions  Pending Prescriptions Disp Refills   furosemide  (LASIX ) 20 MG tablet 30 tablet 11    Sig: Take 1 tablet (20 mg total) by mouth as needed for weight gain of 3 lbs in 24 hours or 5 lbs in a week     Cardiovascular:  Diuretics - Loop Failed - 07/27/2024  3:54 PM      Failed - Cr in normal range and within 180 days    Creatinine, Ser  Date Value Ref Range Status  03/09/2024 1.45 (H) 0.61 - 1.24 mg/dL Final         Failed - Mg Level in normal range and within 180 days    Magnesium   Date Value Ref Range Status  03/02/2022 1.8 1.7 - 2.4 mg/dL Final    Comment:    Performed at [facility], [address], [place], [place] [address]         Failed - Last BP in normal range    BP Readings from Last 1 Encounters:  03/16/24 (!) 167/82         Failed - Valid encounter within last 6 months    Recent Outpatient Visits           1 year ago Bilateral swelling of feet   [place] [facility] Celestia Rosaline SQUIBB, NP   1 year ago STD (male)   [place] [facility] Celestia Rosaline SQUIBB, NP   2 years ago Pain of right side of body   [place] [facility] Celestia Rosaline SQUIBB, NP   2 years ago Healthcare maintenance   [place] [facility] Celestia Rosaline SQUIBB, NP   2 years ago Prediabetes    [facility] Celestia Rosaline SQUIBB, NP              Passed - K in normal range and within 180 days    Potassium  Date Value Ref Range Status  03/09/2024 3.7 3.5 - 5.1 mmol/L Final         Passed - Ca in normal range and within 180 days    Calcium   Date Value Ref Range Status   03/09/2024 9.1 8.9 - 10.3 mg/dL Final   Calcium , Ion  Date Value Ref Range Status  03/12/2021 0.96 (L) 1.15 - 1.40 mmol/L Final         Passed - Na in normal range and within 180 days    Sodium  Date Value Ref Range Status  03/09/2024 138 135 - 145 mmol/L Final  04/23/2021 140 134 - 144 mmol/L Final         Passed - Cl in normal range and within 180 days    Chloride  Date Value Ref Range Status  03/09/2024 109 98 - 111 mmol/L Final          ibuprofen  (ADVIL ) 600 MG tablet 30 tablet 0    Sig: Take 1 tablet (600 mg total) by mouth every 8 (eight) hours as needed.     Analgesics:  NSAIDS Failed - 07/27/2024  3:54 PM      Failed - Manual Review: Labs are  only required if the patient has taken medication for more than 8 weeks.      Failed - Cr in normal range and within 360 days    Creatinine, Ser  Date Value Ref Range Status  03/09/2024 1.45 (H) 0.61 - 1.24 mg/dL Final         Failed - Valid encounter within last 12 months    Recent Outpatient Visits           1 year ago Bilateral swelling of feet   [place] [facility] Celestia Rosaline SQUIBB, NP   1 year ago STD (male)   [place] [facility] Celestia Rosaline SQUIBB, NP   2 years ago Pain of right side of body   [place] [facility] Celestia Rosaline SQUIBB, NP   2 years ago Healthcare maintenance   [place] [facility] Celestia Rosaline SQUIBB, NP   2 years ago Prediabetes   [place] [facility] Celestia Rosaline SQUIBB, NP              Passed - HGB in normal range and within 360 days    Hemoglobin  Date Value Ref Range Status  11/27/2023 16.5 13.0 - 17.0 g/dL Final   Total hemoglobin  Date Value Ref Range Status  03/19/2021 12.5 12.0 - 16.0 g/dL Final         Passed - PLT in normal range and within 360 days    Platelets  Date Value Ref Range Status  11/27/2023 272 150 - 400 K/uL Final         Passed - HCT in normal range and  within 360 days    HCT  Date Value Ref Range Status  11/27/2023 48.8 39.0 - 52.0 % Final         Passed - eGFR is 30 or above and within 360 days    GFR calc Af Amer  Date Value Ref Range Status  11/16/2019 >60 >60 mL/min Final   GFR, Estimated  Date Value Ref Range Status  03/09/2024 53 (L) >60 mL/min Final    Comment:    (NOTE) Calculated using the CKD-EPI Creatinine Equation (2021)    eGFR  Date Value Ref Range Status  04/23/2021 48 (L) >59 mL/min/1.73 Final   EGFR  Date Value Ref Range Status  01/13/2023 56.0  Final    Comment:    Abstracted by HIM         Passed - Patient is not pregnant       nitroGLYCERIN  (NITROSTAT ) 0.4 MG SL tablet 25 tablet 1    Sig: Place 1 tablet (0.4 mg total) under the tongue every 5 (five) minutes as needed for chest pain.     Cardiovascular:  Nitrates Failed - 07/27/2024  3:54 PM      Failed - Last BP in normal range    BP Readings from Last 1 Encounters:  03/16/24 (!) 167/82         Failed - Valid encounter within last 12 months    Recent Outpatient Visits           1 year ago Bilateral swelling of feet   [place] [facility] Celestia Rosaline SQUIBB, NP   1 year ago STD (male)   [place] [facility] Celestia Rosaline SQUIBB, NP   2 years ago Pain of right side of body   [place] [facility] Celestia Rosaline SQUIBB, NP   2 years ago  Healthcare maintenance   [place] [facility] Celestia Rosaline SQUIBB, NP   2 years ago Prediabetes   [place] [facility] Celestia Rosaline SQUIBB, NP              Passed - Last Heart Rate in normal range    Pulse Readings from Last 1 Encounters:  03/16/24 75          rf

## 2024-08-14 ENCOUNTER — Ambulatory Visit: Payer: Self-pay | Admitting: Primary Care
